# Patient Record
Sex: Female | Born: 1975 | Race: Black or African American | Hispanic: No | Marital: Single | Smoking: Former smoker
Health system: Southern US, Community
[De-identification: ages and names within clinical notes are randomized; demographics above are authoritative.]

## PROBLEM LIST (undated history)

## (undated) DIAGNOSIS — J45909 Unspecified asthma, uncomplicated: Secondary | ICD-10-CM

## (undated) DIAGNOSIS — D573 Sickle-cell trait: Secondary | ICD-10-CM

## (undated) DIAGNOSIS — N393 Stress incontinence (female) (male): Secondary | ICD-10-CM

## (undated) DIAGNOSIS — D869 Sarcoidosis, unspecified: Secondary | ICD-10-CM

## (undated) DIAGNOSIS — I10 Essential (primary) hypertension: Secondary | ICD-10-CM

## (undated) HISTORY — DX: Sickle-cell trait: D57.3

## (undated) HISTORY — PX: BREAST SURGERY: SHX581

## (undated) HISTORY — DX: Stress incontinence (female) (male): N39.3

## (undated) HISTORY — PX: TUBAL LIGATION: SHX77

---

## 1997-07-23 ENCOUNTER — Inpatient Hospital Stay (HOSPITAL_COMMUNITY): Admission: AD | Admit: 1997-07-23 | Discharge: 1997-07-23 | Payer: Self-pay | Admitting: Obstetrics

## 1997-08-05 ENCOUNTER — Other Ambulatory Visit: Admission: RE | Admit: 1997-08-05 | Discharge: 1997-08-05 | Payer: Self-pay | Admitting: Obstetrics

## 1997-08-13 ENCOUNTER — Inpatient Hospital Stay (HOSPITAL_COMMUNITY): Admission: AD | Admit: 1997-08-13 | Discharge: 1997-08-13 | Payer: Self-pay | Admitting: Obstetrics

## 1997-08-15 ENCOUNTER — Ambulatory Visit (HOSPITAL_COMMUNITY): Admission: AD | Admit: 1997-08-15 | Discharge: 1997-08-15 | Payer: Self-pay | Admitting: Obstetrics

## 1997-10-31 ENCOUNTER — Inpatient Hospital Stay (HOSPITAL_COMMUNITY): Admission: AD | Admit: 1997-10-31 | Discharge: 1997-10-31 | Payer: Self-pay | Admitting: Obstetrics

## 1997-11-01 ENCOUNTER — Emergency Department (HOSPITAL_COMMUNITY): Admission: EM | Admit: 1997-11-01 | Discharge: 1997-11-01 | Payer: Self-pay | Admitting: Emergency Medicine

## 1997-11-01 ENCOUNTER — Encounter: Payer: Self-pay | Admitting: Emergency Medicine

## 1997-11-04 ENCOUNTER — Ambulatory Visit (HOSPITAL_COMMUNITY): Admission: RE | Admit: 1997-11-04 | Discharge: 1997-11-04 | Payer: Self-pay | Admitting: Emergency Medicine

## 1998-03-28 ENCOUNTER — Ambulatory Visit: Admission: RE | Admit: 1998-03-28 | Discharge: 1998-03-28 | Payer: Self-pay | Admitting: Obstetrics

## 1998-03-28 ENCOUNTER — Other Ambulatory Visit: Admission: RE | Admit: 1998-03-28 | Discharge: 1998-03-28 | Payer: Self-pay | Admitting: Obstetrics

## 1998-10-12 ENCOUNTER — Inpatient Hospital Stay (HOSPITAL_COMMUNITY): Admission: AD | Admit: 1998-10-12 | Discharge: 1998-10-12 | Payer: Self-pay | Admitting: Obstetrics

## 1998-11-06 ENCOUNTER — Inpatient Hospital Stay (HOSPITAL_COMMUNITY): Admission: AD | Admit: 1998-11-06 | Discharge: 1998-11-09 | Payer: Self-pay | Admitting: Obstetrics

## 1999-10-03 ENCOUNTER — Other Ambulatory Visit: Admission: RE | Admit: 1999-10-03 | Discharge: 1999-10-03 | Payer: Self-pay | Admitting: Obstetrics

## 2000-04-04 ENCOUNTER — Inpatient Hospital Stay (HOSPITAL_COMMUNITY): Admission: AD | Admit: 2000-04-04 | Discharge: 2000-04-06 | Payer: Self-pay | Admitting: Obstetrics

## 2000-05-22 ENCOUNTER — Ambulatory Visit (HOSPITAL_COMMUNITY): Admission: RE | Admit: 2000-05-22 | Discharge: 2000-05-22 | Payer: Self-pay | Admitting: Obstetrics

## 2002-12-28 ENCOUNTER — Emergency Department (HOSPITAL_COMMUNITY): Admission: EM | Admit: 2002-12-28 | Discharge: 2002-12-28 | Payer: Self-pay | Admitting: Emergency Medicine

## 2005-06-30 ENCOUNTER — Emergency Department (HOSPITAL_COMMUNITY): Admission: EM | Admit: 2005-06-30 | Discharge: 2005-06-30 | Payer: Self-pay | Admitting: Emergency Medicine

## 2006-09-20 ENCOUNTER — Encounter: Payer: Self-pay | Admitting: Pulmonary Disease

## 2007-06-07 ENCOUNTER — Emergency Department (HOSPITAL_COMMUNITY): Admission: EM | Admit: 2007-06-07 | Discharge: 2007-06-07 | Payer: Self-pay | Admitting: Emergency Medicine

## 2007-06-10 ENCOUNTER — Ambulatory Visit (HOSPITAL_COMMUNITY): Admission: RE | Admit: 2007-06-10 | Discharge: 2007-06-10 | Payer: Self-pay | Admitting: Pulmonary Disease

## 2007-06-27 ENCOUNTER — Ambulatory Visit: Payer: Self-pay | Admitting: Pulmonary Disease

## 2007-06-27 DIAGNOSIS — R0602 Shortness of breath: Secondary | ICD-10-CM

## 2007-06-27 DIAGNOSIS — I10 Essential (primary) hypertension: Secondary | ICD-10-CM | POA: Insufficient documentation

## 2007-06-27 LAB — CONVERTED CEMR LAB
ALT: 30 units/L (ref 0–35)
AST: 29 units/L (ref 0–37)
Basophils Absolute: 0.1 10*3/uL (ref 0.0–0.1)
Basophils Relative: 1 % (ref 0.0–1.0)
Bilirubin, Direct: 0.1 mg/dL (ref 0.0–0.3)
CO2: 30 meq/L (ref 19–32)
Chloride: 107 meq/L (ref 96–112)
Lymphocytes Relative: 26.5 % (ref 12.0–46.0)
MCHC: 33.2 g/dL (ref 30.0–36.0)
Neutrophils Relative %: 55.8 % (ref 43.0–77.0)
Potassium: 3.5 meq/L (ref 3.5–5.1)
Prothrombin Time: 12.8 s (ref 10.9–13.3)
RBC: 4.69 M/uL (ref 3.87–5.11)
RDW: 13.2 % (ref 11.5–14.6)
Sodium: 140 meq/L (ref 135–145)
Total Bilirubin: 0.6 mg/dL (ref 0.3–1.2)

## 2007-07-02 ENCOUNTER — Ambulatory Visit: Payer: Self-pay | Admitting: Pulmonary Disease

## 2007-07-02 ENCOUNTER — Ambulatory Visit: Admission: RE | Admit: 2007-07-02 | Discharge: 2007-07-02 | Payer: Self-pay | Admitting: Pulmonary Disease

## 2007-07-02 ENCOUNTER — Encounter: Payer: Self-pay | Admitting: Pulmonary Disease

## 2007-07-07 ENCOUNTER — Telehealth (INDEPENDENT_AMBULATORY_CARE_PROVIDER_SITE_OTHER): Payer: Self-pay | Admitting: *Deleted

## 2007-07-14 ENCOUNTER — Ambulatory Visit: Payer: Self-pay | Admitting: Pulmonary Disease

## 2007-07-14 ENCOUNTER — Emergency Department (HOSPITAL_COMMUNITY): Admission: EM | Admit: 2007-07-14 | Discharge: 2007-07-14 | Payer: Self-pay | Admitting: Family Medicine

## 2007-07-14 DIAGNOSIS — D869 Sarcoidosis, unspecified: Secondary | ICD-10-CM | POA: Insufficient documentation

## 2007-07-14 DIAGNOSIS — J4489 Other specified chronic obstructive pulmonary disease: Secondary | ICD-10-CM | POA: Insufficient documentation

## 2007-07-14 DIAGNOSIS — J449 Chronic obstructive pulmonary disease, unspecified: Secondary | ICD-10-CM | POA: Insufficient documentation

## 2007-07-14 DIAGNOSIS — J45909 Unspecified asthma, uncomplicated: Secondary | ICD-10-CM | POA: Insufficient documentation

## 2007-09-08 ENCOUNTER — Ambulatory Visit: Payer: Self-pay | Admitting: Pulmonary Disease

## 2007-10-14 ENCOUNTER — Encounter: Payer: Self-pay | Admitting: Pulmonary Disease

## 2007-10-14 ENCOUNTER — Ambulatory Visit: Payer: Self-pay | Admitting: Pulmonary Disease

## 2007-11-11 ENCOUNTER — Ambulatory Visit: Payer: Self-pay | Admitting: Pulmonary Disease

## 2008-07-05 ENCOUNTER — Emergency Department (HOSPITAL_COMMUNITY): Admission: EM | Admit: 2008-07-05 | Discharge: 2008-07-05 | Payer: Self-pay | Admitting: Family Medicine

## 2009-06-10 IMAGING — CR DG CHEST 1V PORT
1 series · 1 of 1 positions shown · non-contrast
Comparison: PA and lateral chest 06/07/2007

CLINICAL DATA: , question pneumothorax.  The patient status post
bronchial biopsies the right lower lobe.

PORTABLE CHEST - 1 VIEW

[view not recorded]
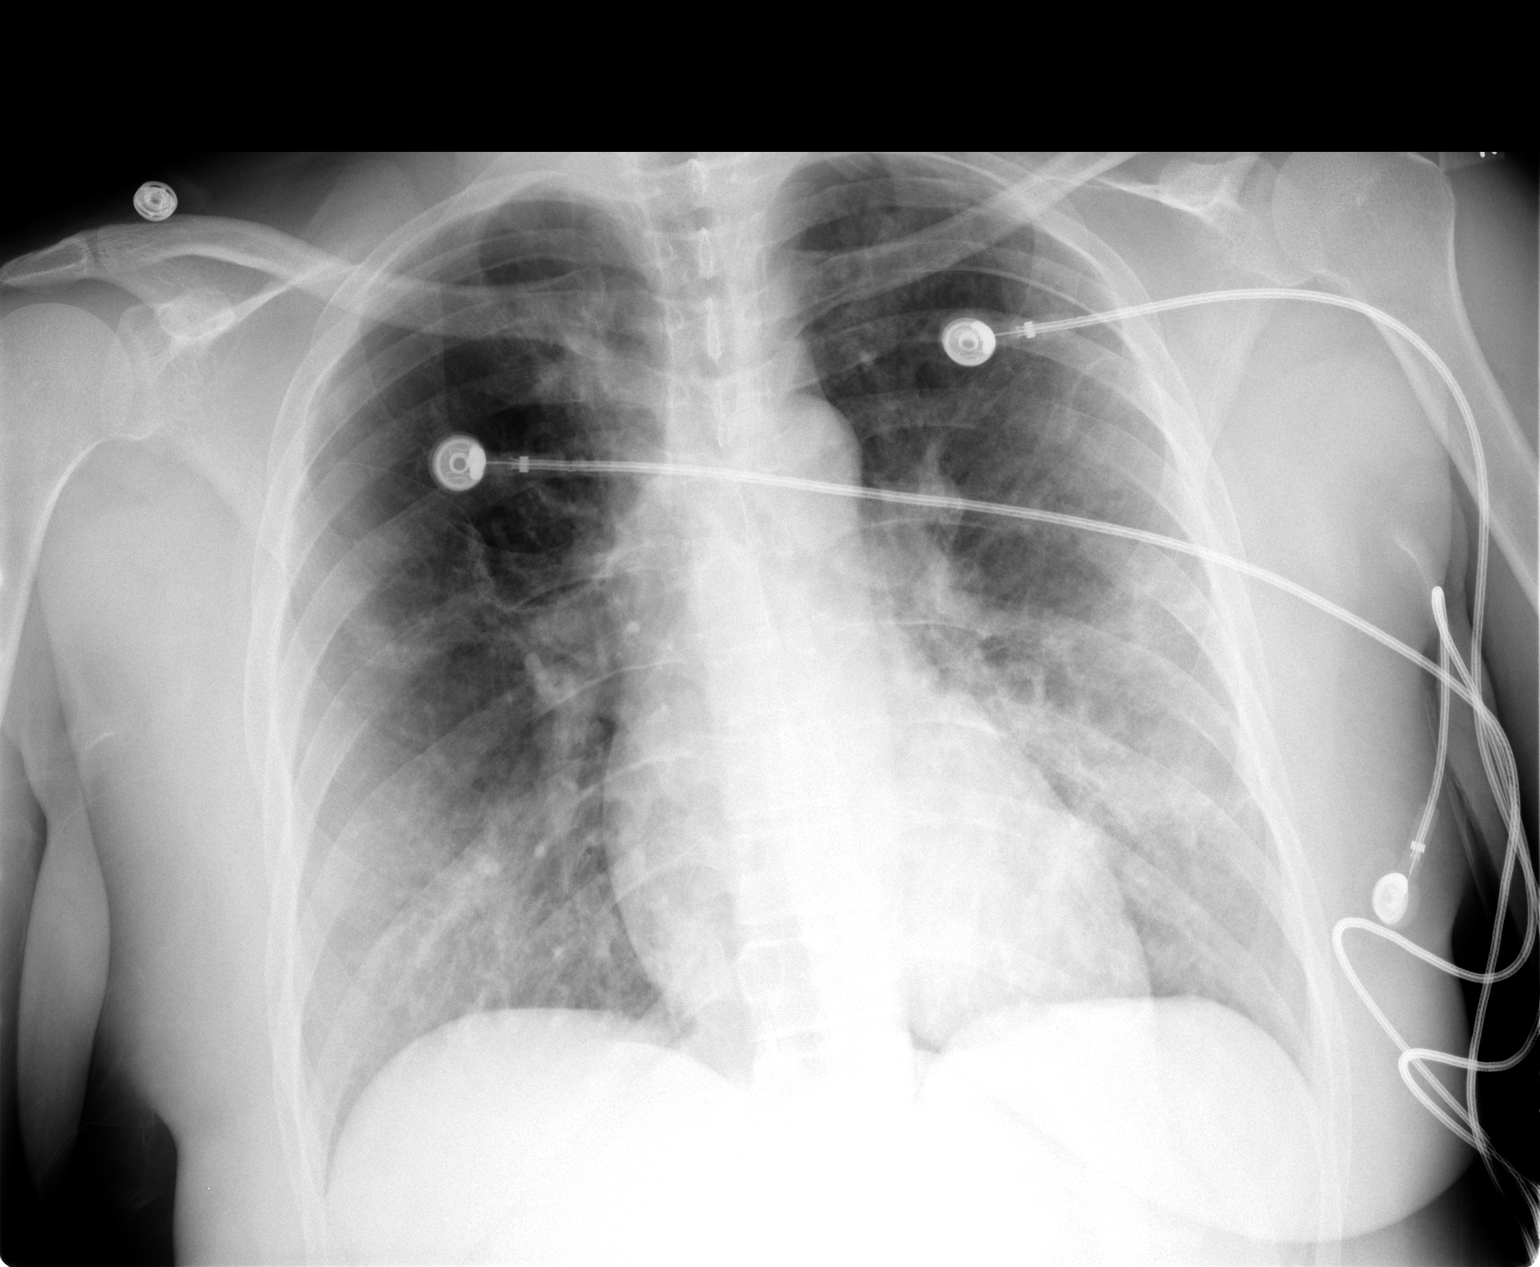

[1 of 1 positions shown; findings below may reference images not displayed]

FINDINGS: Lung volumes are low with scattered atelectatic change.
There is no pneumothorax.  Bullous lesion is seen the right upper
lobe with patchy bilateral airspace opacity in the mid and lower
lung zones persisting.
IMPRESSION: 1.  Negative for pneumothorax.  No change since the prior study.

## 2010-06-13 NOTE — Op Note (Signed)
NAMEORLA, ESTRIN                ACCOUNT NO.:  [id number]   MEDICAL RECORD NO.:  [id number]          PATIENT TYPE:  AMB   LOCATION:  CARD                         FACILITY:  [facility]   PHYSICIAN:  Barbaraann Share, MD,FCCPDATE OF BIRTH:  03-04-73   DATE OF PROCEDURE:  07/02/2007  DATE OF DISCHARGE:                               OPERATIVE REPORT   PROCEDURE:  Flexible fiberoptic bronchoscopy with biopsy and  bronchoalveolar lavage   INDICATIONS:  Pulmonary infiltrates of unknown etiology.   OPERATOR:  Barbaraann Share, MD.   ANESTHESIA:  Demerol 100 mg IV, Versed 10 mg IV, and topical 1%  lidocaine to vocal cords and airways during the procedure.   DESCRIPTION:  After obtaining informed consent and under close  cardiopulmonary monitoring, the above preop anesthesia was given and the  fiberoptic scope was passed through the left naris and into the  posterior pharynx, where there were no lesions or other abnormalities  seen.  The vocal cords appeared to be within normal limits and moved  bilaterally on phonation.  The scope was then passed into the trachea,  where it was examined along its entire length down to the level of the  carina.  The left and right tracheobronchial trees were examined  serially to the subsegmental level with no abnormality being noted.  Attention was then paid to the superior segment of the left lower lobe,  where bronchoalveolar lavage was done.  The scope was then passed to the  right lower lobe, where transbronchial biopsies under fluoroscopic  guidance were taken from the lateral basilar segment and posterior  basilar segment of the right lower lobe.  The patient had excellent  hemostasis throughout the procedure and remained cardiovascularly  stable.  Fluoroscopy was used to examine the right chest after the  procedure and there was no immediate evidence for pneumothorax.  Overall, the patient tolerated the procedure well and there were no  complications.  Chest x-ray is pending at time of dictation to rule out  pneumothorax, post transbronchial lung biopsy.      Barbaraann Share, MD,FCCP  Electronically Signed     [facility]  D:  07/02/2007  T:  07/02/2007  Job:  [id number]

## 2010-06-16 NOTE — Op Note (Signed)
[facility]  Patient:    Teresa Mendez, Teresa Mendez                       MRN: [id number] Proc. Date: 05/22/00 Adm. Date:  [id number] Attending:  Venita Sheffield                           Operative Report  PREOPERATIVE DIAGNOSIS:       Multiparity.  POSTOPERATIVE DIAGNOSIS:      Multiparity.  OPERATION:                    Open laparoscopic tubal sterilization.  SURGEON:                      Kathreen Cosier, M.D.  ANESTHESIA:                   General anesthesia.  DESCRIPTION OF PROCEDURE:     With the patient in the lithotomy position, abdomen, perineum, and vagina prepped and draped.  Bladder emptied with straight catheter.  Weighted speculum placed in the vagina.  Anterior lip of the cervix grasped with the Hulka tenaculum.  In the umbilicus, a transverse incision made, carried down to the fascia.  The fascia was cleaned, grasped with two Kochers.  The fascia and the peritoneum were opened with Mayo scissors.  The sleeve of the trocar was inserted intraperitoneally.  Three liters of carbon dioxide infusion in peritoneal cavity.  The visualizing scope inserted through the sleeve with scope.  Uterus, tubes, and ovaries were normal.  cautery probe inserted through the sleeve of the scope.  The right tube was grasped 1 inch from the cornu and cauterized.  The tube was cauterized in a total of five places, moving lateral from the first site of cautery.  Approximately 2 inches of tube cauterized.  The procedure was done in the exact fashion on the other side.  Probes were removed, CO2 allowed to escape from the peritoneal cavity.  Fascia closed with one suture of 0 Dexon and skin closed with subcuticular stitch of 3-0 plain.  The patient tolerated the procedure well and was taken to the recovery room in good condition. DD:  05/22/00 TD:  05/22/00 Job: [id number]

## 2010-10-26 LAB — LEGIONELLA PROFILE(CULTURE+DFA/SMEAR): Legionella Antigen (DFA): NEGATIVE

## 2010-10-26 LAB — BODY FLUID CELL COUNT WITH DIFFERENTIAL
Monocyte-Macrophage-Serous Fluid: 12 — ABNORMAL LOW
Neutrophil Count, Fluid: 76 — ABNORMAL HIGH

## 2010-10-26 LAB — P CARINII SMEAR DFA

## 2010-10-26 LAB — FUNGUS CULTURE W SMEAR

## 2010-10-26 LAB — CULTURE, ROUTINE-ABSCESS

## 2010-10-26 LAB — CULTURE, RESPIRATORY W GRAM STAIN

## 2010-10-26 LAB — PATHOLOGIST SMEAR REVIEW

## 2010-10-26 LAB — AFB CULTURE WITH SMEAR (NOT AT ARMC)

## 2011-07-02 ENCOUNTER — Emergency Department (HOSPITAL_COMMUNITY)
Admission: EM | Admit: 2011-07-02 | Discharge: 2011-07-02 | Disposition: A | Discharge status: Home / Self Care | Payer: Medicaid Other | Source: Home / Self Care | Attending: Emergency Medicine | Admitting: Emergency Medicine

## 2011-07-02 ENCOUNTER — Encounter (HOSPITAL_COMMUNITY): Payer: Self-pay | Admitting: *Deleted

## 2011-07-02 DIAGNOSIS — M436 Torticollis: Secondary | ICD-10-CM

## 2011-07-02 HISTORY — DX: Essential (primary) hypertension: I10

## 2011-07-02 MED ORDER — MELOXICAM 15 MG PO TABS: 15.0000 mg | ORAL_TABLET | Freq: Every day | ORAL | Status: AC

## 2011-07-02 MED ORDER — METHOCARBAMOL 500 MG PO TABS: 500.0000 mg | ORAL_TABLET | Freq: Three times a day (TID) | ORAL | Status: AC

## 2011-07-02 MED ORDER — TRAMADOL HCL 50 MG PO TABS: 100.0000 mg | ORAL_TABLET | Freq: Three times a day (TID) | ORAL | Status: AC | PRN

## 2011-07-02 NOTE — ED Provider Notes (Signed)
Chief Complaint  Patient presents with  . Neck Pain    History of Present Illness:   Teresa Mendez is a 36 year old female with a one half day history of pain in the right side of the neck which extends from the septal area to the shoulder. She woke up with pain one morning and denies any injury. She has had similar symptoms before. It hurts to rotate to the right or to bend to the right. There is no radiation down the arm, numbness, tingling, or muscle weakness. She denies fever, chills, or sweats.  Review of Systems:  Other than noted above, the patient denies any of the following symptoms: Constitutional:  No fever, chills, or sweats. ENT:  No nasal congestion, sore throat, pain on swallowing, or oral ulcerations or lesions.  No hoarseness or change in voice. Neck:  No swelling, or adenopathy.  Full ROM without pain. Cardiac:  No chest pain, tightness, pressure, or syncope. Respiratory:  No cough, wheezing, or dyspnea. M-S:  No joint pain, muscle pain, or back problems. Neuro:  No muscle weakness, numbness or paresthesias. Skin:  No rash.  PMFSH:  Past medical history, family history, social history, meds, and allergies were reviewed.  Physical Exam:   Vital signs:  BP 150/95  Pulse 84  Temp(Src) 98.7 F (37.1 C) (Oral)  Resp 18  SpO2 100%  LMP 06/04/2011 General:  Alert, oriented and in no distress. Eye:  PERRL, full EOMs. ENT:  Pharynx clear, no oral lesions. Neck:  There was tenderness to palpation over the right trapezius ridge. No tenderness in the midline. She's holding her head to the left and attempts to either rotate the right or lateral bending to the right causes pain. Lungs:  No respiratory distress.  Breath sounds clear and equal bilaterally.  No wheezes, rales or rhonchi. Heart:  Regular rhythm.  No gallops, murmers, or rubs. Ext:  No upper extremity edema, pulses full.  Full ROM of joints with no joint or muscle pain to palpation. Neuro:  Alert and oriented times 3.  No  focal muscle weakness.  DTRs symmetric.  Sensation intact to light touch. Skin: Clear, warm and dry.  No rash.  Good capillary refill.  Assessment:  The encounter diagnosis was Torticollis.  Plan:   1.  The following meds were prescribed:   New Prescriptions   MELOXICAM (MOBIC) 15 MG TABLET    Take 1 tablet (15 mg total) by mouth daily.   METHOCARBAMOL (ROBAXIN) 500 MG TABLET    Take 1 tablet (500 mg total) by mouth 3 (three) times daily.   TRAMADOL (ULTRAM) 50 MG TABLET    Take 2 tablets (100 mg total) by mouth every 8 (eight) hours as needed for pain.   2.  The patient was instructed in symptomatic care and handouts were given. 3.  The patient was told to return if becoming worse in any way, if no better in 3 or 4 days, and given some red flag symptoms that would indicate earlier return.    Reuben Likes, MD 07/02/11 2108

## 2011-07-02 NOTE — ED Notes (Signed)
Teresa Mendez  HAS  HAD  NECK  PAIN  FOR  THE  LAST  SEV  DAYS  SHE  DENYS  ANY INJURY  SHE   STATES  SHE  HAS  HAD  SIMIL;AR  EPISODE  IN PAST LIKE  THIS        SHE  DENYS  ANY  NUMBENESS IN  EXTREMITIES  HOWEVER  REPORTS   HER  HANDS  FEEL  TIGHT       -  SHE  REPORTS    THE PAIN IS  WORSE  ON MOVEMENT

## 2011-07-02 NOTE — Discharge Instructions (Signed)
Torticollis, Acute You have suddenly (acutely) developed a twisted neck (torticollis). This is usually a self-limited condition. CAUSES  Acute torticollis may be caused by malposition, trauma or infection. Most commonly, acute torticollis is caused by sleeping in an awkward position. Torticollis may also be caused by the flexion, extension or twisting of the neck muscles beyond their normal position. Sometimes, the exact cause may not be known. SYMPTOMS  Usually, there is pain and limited movement of the neck. Your neck may twist to one side. DIAGNOSIS  The diagnosis is often made by physical examination. X-rays, CT scans or MRIs may be done if there is a history of trauma or concern of infection. TREATMENT  For a common, stiff neck that develops during sleep, treatment is focused on relaxing the contracted neck muscle. Medications (including shots) may be used to treat the problem. Most cases resolve in several days. Torticollis usually responds to conservative physical therapy. If left untreated, the shortened and spastic neck muscle can cause deformities in the face and neck. Rarely, surgery is required. HOME CARE INSTRUCTIONS   Use over-the-counter and prescription medications as directed by your caregiver.   Do stretching exercises and massage the neck as directed by your caregiver.   Follow up with physical therapy if needed and as directed by your caregiver.  SEEK IMMEDIATE MEDICAL CARE IF:   You develop difficulty breathing or noisy breathing (stridor).   You drool, develop trouble swallowing or have pain with swallowing.   You develop numbness or weakness in the hands or feet.   You have changes in speech or vision.   You have problems with urination or bowel movements.   You have difficulty walking.   You have a fever.   You have increased pain.  MAKE SURE YOU:   Understand these instructions.   Will watch your condition.   Will get help right away if you are not  doing well or get worse.  Document Released: 01/13/2000 Document Revised: 01/04/2011 Document Reviewed: 02/23/2009 Exit[employer] Patient Information 2012 [employer]. 

## 2011-07-11 ENCOUNTER — Telehealth (HOSPITAL_COMMUNITY): Payer: Self-pay | Admitting: *Deleted

## 2011-07-11 NOTE — ED Notes (Signed)
Brandy at [facility] called and said pt. asked for a refill of her Methocarbimal. I told her we are not a primary care facility and don't do refills. Vassie Moselle 07/11/2011

## 2013-08-05 ENCOUNTER — Emergency Department (HOSPITAL_COMMUNITY)
Admission: EM | Admit: 2013-08-05 | Discharge: 2013-08-05 | Disposition: A | Discharge status: Home / Self Care | Payer: Medicaid Other | Source: Home / Self Care | Attending: Emergency Medicine | Admitting: Emergency Medicine

## 2013-08-05 ENCOUNTER — Encounter (HOSPITAL_COMMUNITY): Payer: Self-pay | Admitting: Emergency Medicine

## 2013-08-05 DIAGNOSIS — J45901 Unspecified asthma with (acute) exacerbation: Secondary | ICD-10-CM

## 2013-08-05 HISTORY — DX: Unspecified asthma, uncomplicated: J45.909

## 2013-08-05 MED ORDER — IPRATROPIUM BROMIDE 0.02 % IN SOLN
0.5000 mg | Freq: Once | RESPIRATORY_TRACT | Status: AC
  Administered 2013-08-05: 0.5 mg via RESPIRATORY_TRACT

## 2013-08-05 MED ORDER — ALBUTEROL SULFATE (2.5 MG/3ML) 0.083% IN NEBU
5.0000 mg | INHALATION_SOLUTION | Freq: Once | RESPIRATORY_TRACT | Status: AC
  Administered 2013-08-05: 5 mg via RESPIRATORY_TRACT

## 2013-08-05 MED ORDER — ALBUTEROL SULFATE (2.5 MG/3ML) 0.083% IN NEBU
INHALATION_SOLUTION | RESPIRATORY_TRACT | Status: AC
  Filled 2013-08-05: qty 6

## 2013-08-05 MED ORDER — PREDNISONE 20 MG PO TABS
ORAL_TABLET | ORAL | Status: AC
  Filled 2013-08-05: qty 3

## 2013-08-05 MED ORDER — PREDNISONE 10 MG PO TABS: ORAL_TABLET | ORAL | Status: DC

## 2013-08-05 MED ORDER — ALBUTEROL SULFATE HFA 108 (90 BASE) MCG/ACT IN AERS: 1.0000 | INHALATION_SPRAY | RESPIRATORY_TRACT | Status: DC | PRN

## 2013-08-05 MED ORDER — IPRATROPIUM BROMIDE 0.02 % IN SOLN
RESPIRATORY_TRACT | Status: AC
  Filled 2013-08-05: qty 2.5

## 2013-08-05 MED ORDER — PREDNISONE 20 MG PO TABS
60.0000 mg | ORAL_TABLET | Freq: Once | ORAL | Status: AC
  Administered 2013-08-05: 60 mg via ORAL

## 2013-08-05 NOTE — ED Provider Notes (Signed)
CSN: [id number]     Arrival date & time 08/05/13  1012 History   First MD Initiated Contact with Patient 08/05/13 1122     Chief Complaint  Patient presents with  . URI  . Vaginal Discharge   (Consider location/radiation/quality/duration/timing/severity/associated sxs/prior Treatment) HPI Comments: Patient presents with 1-2 weeks of progressive asthma exacerbation with associated cough and wheezing.  Denies fever/chills Is a smoker (states she has been trying to quit over past 4-5 days). No hemoptysis. PCP: Dr. Jeanie Cooks Using albuterol MDI at home with minimal relief.    Patient is a 38 y.o. female presenting with URI and vaginal discharge. The history is provided by the patient.  URI Presenting symptoms: cough   Associated symptoms: wheezing   Vaginal Discharge   Past Medical History  Diagnosis Date  . Hypertension   . Asthma    History reviewed. No pertinent past surgical history. No family history on file. History  Substance Use Topics  . Smoking status: Current Every Day Smoker  . Smokeless tobacco: Not on file  . Alcohol Use: No   OB History   Grav Para Term Preterm Abortions TAB SAB Ect Mult Living                 Review of Systems  Constitutional: Negative.   HENT: Negative.   Eyes: Negative.   Respiratory: Positive for cough, chest tightness, shortness of breath and wheezing.   Cardiovascular: Negative.   Genitourinary: Positive for vaginal discharge.  Allergic/Immunologic: Negative for immunocompromised state.  Neurological: Negative.     Allergies  Review of patient's allergies indicates no known allergies.  Home Medications   Prior to Admission medications   Medication Sig Start Date End Date Taking? Authorizing Provider  ALBUTEROL IN Inhale into the lungs.   Yes Historical Provider, MD  amLODipine (NORVASC) 10 MG tablet Take 10 mg by mouth daily.   Yes Historical Provider, MD  lisinopril (PRINIVIL,ZESTRIL) 20 MG tablet Take 20 mg by mouth daily.    Yes Historical Provider, MD  albuterol (PROVENTIL HFA;VENTOLIN HFA) 108 (90 BASE) MCG/ACT inhaler Inhale 1-2 puffs into the lungs every 4 (four) hours as needed for wheezing or shortness of breath. 08/05/13   Lahoma Rocker, PA  predniSONE (DELTASONE) 10 MG tablet Beginning 08/06/2013 take 5 tabs po QD day 1, 4 tabs po QD day 2, 3 tabs po QD day 3, 2 tabs po QD day 4, 1 tab po QD day 5, then stop 08/05/13   Annett Gula Avah Bashor, PA   BP 163/94  Pulse 92  Temp(Src) 98.5 F (36.9 C) (Oral)  Resp 30  SpO2 96% Physical Exam  Nursing note and vitals reviewed. Constitutional: She is oriented to person, place, and time. She appears well-developed and well-nourished. No distress.  HENT:  Head: Normocephalic and atraumatic.  Right Ear: Hearing, tympanic membrane, external ear and ear canal normal.  Left Ear: Hearing, tympanic membrane, external ear and ear canal normal.  Nose: Nose normal.  Mouth/Throat: Uvula is midline, oropharynx is clear and moist and mucous membranes are normal. No oral lesions. No trismus in the jaw.  Eyes: Conjunctivae are normal. No scleral icterus.  Neck: Normal range of motion. Neck supple.  Cardiovascular: Normal rate, regular rhythm and normal heart sounds.   Pulmonary/Chest: Accessory muscle usage present. Tachypnea noted. She has decreased breath sounds in the right lower field and the left lower field. She has wheezes in the right upper field, the right middle field, the right lower field, the left  upper field, the left middle field and the left lower field. She has no rhonchi. She has no rales.  Abdominal: Soft. Bowel sounds are normal. She exhibits no distension. There is no tenderness.  Musculoskeletal: Normal range of motion. She exhibits no edema and no tenderness.  Neurological: She is alert and oriented to person, place, and time.  Skin: Skin is warm and dry. No rash noted. No erythema. No pallor.  Psychiatric: She has a normal mood and affect. Her behavior  is normal.    ED Course  Procedures (including critical care time) Labs Review Labs Reviewed - No data to display  Imaging Review No results found.   MDM   1. Asthma exacerbation   Patient states that while she did mention a clear vaginal discharge to her RN, she wishes to focus on treatment for her asthma and increased work of breathing today and plans to follow up with her PCP for evaluation of discharge. Therefore, no pelvic exam performed.  Patient received Albuterol 5 mg & Atrovent 0.5mg  neb treatment at [facility] along with oral prednisone 60 mg. No hypoxia or fever. Re-evaluation following treatment revealed moderate improvement, but still with visible increased work of breathing and moderate wheezing . I suggested to patient that she would benefit from either extended time bronchodilator treatment or repeat neb treatments at the ER. She agreed that this would be beneficial, but states she has to pick up her children and then take her mother to a doctor's appointment. So, she requests discharge home with the understanding that if symptoms do not begin to improve or if they begin to worsen over the next few hours, she is to report directly to her nearest ER for assistance. Will D/C home with rx for albuterol MDI and prednisone taper and printed instructions.    [place], [place] 08/05/13 [phone]

## 2013-08-05 NOTE — ED Notes (Signed)
Patient has multiple complaints.  Patient has uri, wheezing, right ear pain.   General abdominal soreness, bilateral lower extremity swelling and pain/tightness. Vaginal discharge clear.

## 2013-08-05 NOTE — Discharge Instructions (Signed)
Asthma, Acute Bronchospasm °Acute bronchospasm caused by asthma is also referred to as an asthma attack. Bronchospasm means your air passages become narrowed. The narrowing is caused by inflammation and tightening of the muscles in the air tubes (bronchi) in your lungs. This can make it hard to breathe or cause you to wheeze and cough. °CAUSES °Possible triggers are: °· Animal dander from the skin, hair, or feathers of animals. °· Dust mites contained in house dust. °· Cockroaches. °· Pollen from trees or grass. °· Mold. °· Cigarette or tobacco smoke. °· Air pollutants such as dust, household cleaners, hair sprays, aerosol sprays, paint fumes, strong chemicals, or strong odors. °· Cold air or weather changes. Cold air may trigger inflammation. Winds increase molds and pollens in the air. °· Strong emotions such as crying or laughing hard. °· Stress. °· Certain medicines such as aspirin or beta-blockers. °· Sulfites in foods and drinks, such as dried fruits and wine. °· Infections or inflammatory conditions, such as a flu, cold, or inflammation of the nasal membranes (rhinitis). °· Gastroesophageal reflux disease (GERD). GERD is a condition where stomach acid backs up into your esophagus. °· Exercise or strenuous activity. °SIGNS AND SYMPTOMS  °· Wheezing. °· Excessive coughing, particularly at night. °· Chest tightness. °· Shortness of breath. °DIAGNOSIS  °Your health care provider will ask you about your medical history and perform a physical exam. A chest X-ray or blood testing may be performed to look for other causes of your symptoms or other conditions that may have triggered your asthma attack.  °TREATMENT  °Treatment is aimed at reducing inflammation and opening up the airways in your lungs.  Most asthma attacks are treated with inhaled medicines. These include quick relief or rescue medicines (such as bronchodilators) and controller medicines (such as inhaled corticosteroids). These medicines are sometimes  given through an inhaler or a nebulizer. Systemic steroid medicine taken by mouth or given through an IV tube also can be used to reduce the inflammation when an attack is moderate or severe. Antibiotic medicines are only used if a bacterial infection is present.  °HOME CARE INSTRUCTIONS  °· Rest. °· Drink plenty of liquids. This helps the mucus to remain thin and be easily coughed up. Only use caffeine in moderation and do not use alcohol until you have recovered from your illness. °· Do not smoke. Avoid being exposed to secondhand smoke. °· You play a critical role in keeping yourself in good health. Avoid exposure to things that cause you to wheeze or to have breathing problems. °· Keep your medicines up-to-date and available. Carefully follow your health care provider's treatment plan. °· Take your medicine exactly as prescribed. °· When pollen or pollution is bad, keep windows closed and use an air conditioner or go to places with air conditioning. °· Asthma requires careful medical care. See your health care provider for a follow-up as advised. If you are more than [redacted] weeks pregnant and you were prescribed any new medicines, let your obstetrician know about the visit and how you are doing. Follow up with your health care provider as directed. °· After you have recovered from your asthma attack, make an appointment with your outpatient doctor to talk about ways to reduce the likelihood of future attacks. If you do not have a doctor who manages your asthma, make an appointment with a primary care doctor to discuss your asthma. °SEEK IMMEDIATE MEDICAL CARE IF:  °· You are getting worse. °· You have trouble breathing. If severe, call your local   emergency services (911 in the [place] °· You develop chest pain or discomfort. °· You are vomiting. °· You are not able to keep fluids down. °· You are coughing up yellow, green, brown, or bloody sputum. °· You have a fever and your symptoms suddenly get worse. °· You have  trouble swallowing. °MAKE SURE YOU:  °· Understand these instructions. °· Will watch your condition. °· Will get help right away if you are not doing well or get worse. °Document Released: 05/02/2006 Document Revised: 01/20/2013 Document Reviewed: 07/23/2012 °[employer]® Patient Information ©2015 [employer]. This information is not intended to replace advice given to you by your health care provider. Make sure you discuss any questions you have with your health care provider. ° °Asthma Attack Prevention °Although there is no way to prevent asthma from starting, you can take steps to control the disease and reduce its symptoms. Learn about your asthma and how to control it. Take an active role to control your asthma by working with your health care provider to create and follow an asthma action plan. An asthma action plan guides you in: °· Taking your medicines properly. °· Avoiding things that set off your asthma or make your asthma worse (asthma triggers). °· Tracking your level of asthma control. °· Responding to worsening asthma. °· Seeking emergency care when needed. °To track your asthma, keep records of your symptoms, check your peak flow number using a handheld device that shows how well air moves out of your lungs (peak flow meter), and get regular asthma checkups.  °WHAT ARE SOME WAYS TO PREVENT AN ASTHMA ATTACK? °· Take medicines as directed by your health care provider. °· Keep track of your asthma symptoms and level of control. °· With your health care provider, write a detailed plan for taking medicines and managing an asthma attack. Then be sure to follow your action plan. Asthma is an ongoing condition that needs regular monitoring and treatment. °· Identify and avoid asthma triggers. Many outdoor allergens and irritants (such as pollen, mold, cold air, and air pollution) can trigger asthma attacks. Find out what your asthma triggers are and take steps to avoid them. °· Monitor your breathing. Learn  to recognize warning signs of an attack, such as coughing, wheezing, or shortness of breath. Your lung function may decrease before you notice any signs or symptoms, so regularly measure and record your peak airflow with a home peak flow meter. °· Identify and treat attacks early. If you act quickly, you are less likely to have a severe attack. You will also need less medicine to control your symptoms. When your peak flow measurements decrease and alert you to an upcoming attack, take your medicine as instructed and immediately stop any activity that may have triggered the attack. If your symptoms do not improve, get medical help. °· Pay attention to increasing quick-relief inhaler use. If you find yourself relying on your quick-relief inhaler, your asthma is not under control. See your health care provider about adjusting your treatment. °WHAT CAN MAKE MY SYMPTOMS WORSE? °A number of common things can set off or make your asthma symptoms worse and cause temporary increased inflammation of your airways. Keep track of your asthma symptoms for several weeks, detailing all the environmental and emotional factors that are linked with your asthma. When you have an asthma attack, go back to your asthma diary to see which factor, or combination of factors, might have contributed to it. Once you know what these factors are, you can   take steps to control many of them. If you have allergies and asthma, it is important to take asthma prevention steps at home. Minimizing contact with the substance to which you are allergic will help prevent an asthma attack. Some triggers and ways to avoid these triggers are: °Animal Dander:  °Some people are allergic to the flakes of skin or dried saliva from animals with fur or feathers.  °· There is no such thing as a hypoallergenic dog or cat breed. All dogs or cats can cause allergies, even if they don't shed. °· Keep these pets out of your home. °· If you are not able to keep a pet  outdoors, keep the pet out of your bedroom and other sleeping areas at all times, and keep the door closed. °· Remove carpets and furniture covered with cloth from your home. If that is not possible, keep the pet away from fabric-covered furniture and carpets. °Dust Mites: °Many people with asthma are allergic to dust mites. Dust mites are tiny bugs that are found in every home in mattresses, pillows, carpets, fabric-covered furniture, bedcovers, clothes, stuffed toys, and other fabric-covered items.  °· Cover your mattress in a special dust-proof cover. °· Cover your pillow in a special dust-proof cover, or wash the pillow each week in hot water. Water must be hotter than 130° F (54.4° C) to kill dust mites. Cold or warm water used with detergent and bleach can also be effective. °· Wash the sheets and blankets on your bed each week in hot water. °· Try not to sleep or lie on cloth-covered cushions. °· Call ahead when traveling and ask for a smoke-free hotel room. Bring your own bedding and pillows in case the hotel only supplies feather pillows and down comforters, which may contain dust mites and cause asthma symptoms. °· Remove carpets from your bedroom and those laid on concrete, if you can. °· Keep stuffed toys out of the bed, or wash the toys weekly in hot water or cooler water with detergent and bleach. °Cockroaches: °Many people with asthma are allergic to the droppings and remains of cockroaches.  °· Keep food and garbage in closed containers. Never leave food out. °· Use poison baits, traps, powders, gels, or paste (for example, boric acid). °· If a spray is used to kill cockroaches, stay out of the room until the odor goes away. °Indoor Mold: °· Fix leaky faucets, pipes, or other sources of water that have mold around them. °· Clean floors and moldy surfaces with a fungicide or diluted bleach. °· Avoid using humidifiers, vaporizers, or swamp coolers. These can spread molds through the air. °Pollen and  Outdoor Mold: °· When pollen or mold spore counts are high, try to keep your windows closed. °· Stay indoors with windows closed from late morning to afternoon. Pollen and some mold spore counts are highest at that time. °· Ask your health care provider whether you need to take anti-inflammatory medicine or increase your dose of the medicine before your allergy season starts. °Other Irritants to Avoid: °· Tobacco smoke is an irritant. If you smoke, ask your health care provider how you can quit. Ask family members to quit smoking, too. Do not allow smoking in your home or car. °· If possible, do not use a wood-burning stove, kerosene heater, or fireplace. Minimize exposure to all sources of smoke, including incense, candles, fires, and fireworks. °· Try to stay away from strong odors and sprays, such as perfume, talcum powder, hair spray, and   paints.  Decrease humidity in your home and use an indoor air cleaning device. Reduce indoor humidity to below 60%. Dehumidifiers or central air conditioners can do this.  Decrease house dust exposure by changing furnace and air cooler filters frequently.  Try to have someone else vacuum for you once or twice a week. Stay out of rooms while they are being vacuumed and for a short while afterward.  If you vacuum, use a dust mask from a hardware store, a double-layered or microfilter vacuum cleaner bag, or a vacuum cleaner with a HEPA filter.  Sulfites in foods and beverages can be irritants. Do not drink beer or wine or eat dried fruit, processed potatoes, or shrimp if they cause asthma symptoms.  Cold air can trigger an asthma attack. Cover your nose and mouth with a scarf on cold or windy days.  Several health conditions can make asthma more difficult to manage, including a runny nose, sinus infections, reflux disease, psychological stress, and sleep apnea. Work with your health care provider to manage these conditions.  Avoid close contact with people who have  a respiratory infection such as a cold or the flu, since your asthma symptoms may get worse if you catch the infection. Wash your hands thoroughly after touching items that may have been handled by people with a respiratory infection.  Get a flu shot every year to protect against the flu virus, which often makes asthma worse for days or weeks. Also get a pneumonia shot if you have not previously had one. Unlike the flu shot, the pneumonia shot does not need to be given yearly. Medicines:  Talk to your health care provider about whether it is safe for you to take aspirin or non-steroidal anti-inflammatory medicines (NSAIDs). In a small number of people with asthma, aspirin and NSAIDs can cause asthma attacks. These medicines must be avoided by people who have known aspirin-sensitive asthma. It is important that people with aspirin-sensitive asthma read labels of all over-the-counter medicines used to treat pain, colds, coughs, and fever.  Beta-blockers and ACE inhibitors are other medicines you should discuss with your health care provider. HOW CAN I FIND OUT WHAT I AM ALLERGIC TO? Ask your asthma health care provider about allergy skin testing or blood testing (the RAST test) to identify the allergens to which you are sensitive. If you are found to have allergies, the most important thing to do is to try to avoid exposure to any allergens that you are sensitive to as much as possible. Other treatments for allergies, such as medicines and allergy shots (immunotherapy) are available.  CAN I EXERCISE? Follow your health care provider's advice regarding asthma treatment before exercising. It is important to maintain a regular exercise program, but vigorous exercise or exercise in cold, humid, or dry environments can cause asthma attacks, especially for those people who have exercise-induced asthma. Document Released: 01/03/2009 Document Revised: 01/20/2013 Document Reviewed: 07/23/2012 [facility] Patient  Information 2015 [place], [place]. This information is not intended to replace advice given to you by your health care provider. Make sure you discuss any questions you have with your health care provider.  Bronchospasm A bronchospasm is a spasm or tightening of the airways going into the lungs. During a bronchospasm breathing becomes more difficult because the airways get smaller. When this happens there can be coughing, a whistling sound when breathing (wheezing), and difficulty breathing. Bronchospasm is often associated with asthma, but not all patients who experience a bronchospasm have asthma. CAUSES  A bronchospasm  is caused by inflammation or irritation of the airways. The inflammation or irritation may be triggered by:   Allergies (such as to animals, pollen, food, or mold). Allergens that cause bronchospasm may cause wheezing immediately after exposure or many hours later.   Infection. Viral infections are believed to be the most common cause of bronchospasm.   Exercise.   Irritants (such as pollution, cigarette smoke, strong odors, aerosol sprays, and paint fumes).   Weather changes. Winds increase molds and pollens in the air. Rain refreshes the air by washing irritants out. Cold air may cause inflammation.   Stress and emotional upset.  SIGNS AND SYMPTOMS   Wheezing.   Excessive nighttime coughing.   Frequent or severe coughing with a simple cold.   Chest tightness.   Shortness of breath.  DIAGNOSIS  Bronchospasm is usually diagnosed through a history and physical exam. Tests, such as chest X-rays, are sometimes done to look for other conditions. TREATMENT   Inhaled medicines can be given to open up your airways and help you breathe. The medicines can be given using either an inhaler or a nebulizer machine.  Corticosteroid medicines may be given for severe bronchospasm, usually when it is associated with asthma. HOME CARE INSTRUCTIONS   Always have a plan  prepared for seeking medical care. Know when to call your health care provider and local emergency services (911 in the [place] Know where you can access local emergency care.  Only take medicines as directed by your health care provider.  If you were prescribed an inhaler or nebulizer machine, ask your health care provider to explain how to use it correctly. Always use a spacer with your inhaler if you were given one.  It is necessary to remain calm during an attack. Try to relax and breathe more slowly.  Control your home environment in the following ways:   Change your heating and air conditioning filter at least once a month.   Limit your use of fireplaces and wood stoves.  Do not smoke and do not allow smoking in your home.   Avoid exposure to perfumes and fragrances.   Get rid of pests (such as roaches and mice) and their droppings.   Throw away plants if you see mold on them.   Keep your house clean and dust free.   Replace carpet with wood, tile, or vinyl flooring. Carpet can trap dander and dust.   Use allergy-proof pillows, mattress covers, and box spring covers.   Wash bed sheets and blankets every week in hot water and dry them in a dryer.   Use blankets that are made of polyester or cotton.   Wash hands frequently. SEEK MEDICAL CARE IF:   You have muscle aches.   You have chest pain.   The sputum changes from clear or white to yellow, green, gray, or bloody.   The sputum you cough up gets thicker.   There are problems that may be related to the medicine you are given, such as a rash, itching, swelling, or trouble breathing.  SEEK IMMEDIATE MEDICAL CARE IF:   You have worsening wheezing and coughing even after taking your prescribed medicines.   You have increased difficulty breathing.   You develop severe chest pain. MAKE SURE YOU:   Understand these instructions.  Will watch your condition.  Will get help right away if you are  not doing well or get worse. Document Released: 01/18/2003 Document Revised: 01/20/2013 Document Reviewed: 07/07/2012 [facility] Patient Information 2015 [place], [place].  This information is not intended to replace advice given to you by your health care provider. Make sure you discuss any questions you have with your health care provider. ° °

## 2013-08-06 NOTE — ED Provider Notes (Signed)
Medical screening examination/treatment/procedure(s) were performed by non-physician practitioner and as supervising physician I was immediately available for consultation/collaboration.  Philipp Deputy, M.D.  Harden Mo, MD 08/06/13 [id number]

## 2015-08-18 ENCOUNTER — Ambulatory Visit (HOSPITAL_COMMUNITY)
Admission: EM | Admit: 2015-08-18 | Discharge: 2015-08-18 | Disposition: A | Discharge status: Home / Self Care | Payer: Medicaid Other | Attending: Emergency Medicine | Admitting: Emergency Medicine

## 2015-08-18 ENCOUNTER — Encounter (HOSPITAL_COMMUNITY): Payer: Self-pay | Admitting: *Deleted

## 2015-08-18 DIAGNOSIS — F17200 Nicotine dependence, unspecified, uncomplicated: Secondary | ICD-10-CM | POA: Diagnosis not present

## 2015-08-18 DIAGNOSIS — I10 Essential (primary) hypertension: Secondary | ICD-10-CM | POA: Diagnosis not present

## 2015-08-18 DIAGNOSIS — R1032 Left lower quadrant pain: Secondary | ICD-10-CM | POA: Diagnosis present

## 2015-08-18 DIAGNOSIS — Z79899 Other long term (current) drug therapy: Secondary | ICD-10-CM | POA: Insufficient documentation

## 2015-08-18 DIAGNOSIS — R319 Hematuria, unspecified: Secondary | ICD-10-CM | POA: Insufficient documentation

## 2015-08-18 DIAGNOSIS — N3090 Cystitis, unspecified without hematuria: Secondary | ICD-10-CM | POA: Diagnosis not present

## 2015-08-18 DIAGNOSIS — J45909 Unspecified asthma, uncomplicated: Secondary | ICD-10-CM | POA: Diagnosis not present

## 2015-08-18 LAB — POCT URINALYSIS DIP (DEVICE)
BILIRUBIN URINE: NEGATIVE
Glucose, UA: NEGATIVE mg/dL
Ketones, ur: NEGATIVE mg/dL
NITRITE: NEGATIVE
Protein, ur: NEGATIVE mg/dL
Specific Gravity, Urine: 1.015 (ref 1.005–1.030)
UROBILINOGEN UA: 1 mg/dL (ref 0.0–1.0)
pH: 7 (ref 5.0–8.0)

## 2015-08-18 LAB — POCT PREGNANCY, URINE: PREG TEST UR: NEGATIVE

## 2015-08-18 MED ORDER — NITROFURANTOIN MACROCRYSTAL 100 MG PO CAPS: 100.0000 mg | ORAL_CAPSULE | Freq: Two times a day (BID) | ORAL | Status: DC

## 2015-08-18 NOTE — Discharge Instructions (Signed)
Start antibiotic (Nitrofurantoin) as directed. Increase fluid intake. If symptoms do not improve within 3 days, go to PCP or if symptoms worsen, go to ER. Also- your blood pressure was elevated today. Please restart your medication today.   Urinary Tract Infection Urinary tract infections (UTIs) can develop anywhere along your urinary tract. Your urinary tract is your body's drainage system for removing wastes and extra water. Your urinary tract includes two kidneys, two ureters, a bladder, and a urethra. Your kidneys are a pair of bean-shaped organs. Each kidney is about the size of your fist. They are located below your ribs, one on each side of your spine. CAUSES Infections are caused by microbes, which are microscopic organisms, including fungi, viruses, and bacteria. These organisms are so small that they can only be seen through a microscope. Bacteria are the microbes that most commonly cause UTIs. SYMPTOMS  Symptoms of UTIs may vary by age and gender of the patient and by the location of the infection. Symptoms in young women typically include a frequent and intense urge to urinate and a painful, burning feeling in the bladder or urethra during urination. Older women and men are more likely to be tired, shaky, and weak and have muscle aches and abdominal pain. A fever may mean the infection is in your kidneys. Other symptoms of a kidney infection include pain in your back or sides below the ribs, nausea, and vomiting. DIAGNOSIS To diagnose a UTI, your caregiver will ask you about your symptoms. Your caregiver will also ask you to provide a urine sample. The urine sample will be tested for bacteria and white blood cells. White blood cells are made by your body to help fight infection. TREATMENT  Typically, UTIs can be treated with medication. Because most UTIs are caused by a bacterial infection, they usually can be treated with the use of antibiotics. The choice of antibiotic and length of  treatment depend on your symptoms and the type of bacteria causing your infection. HOME CARE INSTRUCTIONS  If you were prescribed antibiotics, take them exactly as your caregiver instructs you. Finish the medication even if you feel better after you have only taken some of the medication.  Drink enough water and fluids to keep your urine clear or pale yellow.  Avoid caffeine, tea, and carbonated beverages. They tend to irritate your bladder.  Empty your bladder often. Avoid holding urine for long periods of time.  Empty your bladder before and after sexual intercourse.  After a bowel movement, women should cleanse from front to back. Use each tissue only once. SEEK MEDICAL CARE IF:   You have back pain.  You develop a fever.  Your symptoms do not begin to resolve within 3 days. SEEK IMMEDIATE MEDICAL CARE IF:   You have severe back pain or lower abdominal pain.  You develop chills.  You have nausea or vomiting.  You have continued burning or discomfort with urination. MAKE SURE YOU:   Understand these instructions.  Will watch your condition.  Will get help right away if you are not doing well or get worse.   This information is not intended to replace advice given to you by your health care provider. Make sure you discuss any questions you have with your health care provider.   Document Released: 10/25/2004 Document Revised: 10/06/2014 Document Reviewed: 02/23/2011 Elsevier Interactive Patient Education 2016 Elsevier Inc.  Hematuria, Adult Hematuria is blood in your urine. It can be caused by a bladder infection, kidney infection, prostate infection,  kidney stone, or cancer of your urinary tract. Infections can usually be treated with medicine, and a kidney stone usually will pass through your urine. If neither of these is the cause of your hematuria, further workup to find out the reason may be needed. It is very important that you tell your health care provider about  any blood you see in your urine, even if the blood stops without treatment or happens without causing pain. Blood in your urine that happens and then stops and then happens again can be a symptom of a very serious condition. Also, pain is not a symptom in the initial stages of many urinary cancers. HOME CARE INSTRUCTIONS   Drink lots of fluid, 3-4 quarts a day. If you have been diagnosed with an infection, cranberry juice is especially recommended, in addition to large amounts of water.  Avoid caffeine, tea, and carbonated beverages because they tend to irritate the bladder.  Avoid alcohol because it may irritate the prostate.  Take all medicines as directed by your health care provider.  If you were prescribed an antibiotic medicine, finish it all even if you start to feel better.  If you have been diagnosed with a kidney stone, follow your health care provider's instructions regarding straining your urine to catch the stone.  Empty your bladder often. Avoid holding urine for long periods of time.  After a bowel movement, women should cleanse front to back. Use each tissue only once.  Empty your bladder before and after sexual intercourse if you are a female. SEEK MEDICAL CARE IF:  You develop back pain.  You have a fever.  You have a feeling of sickness in your stomach (nausea) or vomiting.  Your symptoms are not better in 3 days. Return sooner if you are getting worse. SEEK IMMEDIATE MEDICAL CARE IF:   You develop severe vomiting and are unable to keep the medicine down.  You develop severe back or abdominal pain despite taking your medicines.  You begin passing a large amount of blood or clots in your urine.  You feel extremely weak or faint, or you pass out. MAKE SURE YOU:   Understand these instructions.  Will watch your condition.  Will get help right away if you are not doing well or get worse.   This information is not intended to replace advice given to you by  your health care provider. Make sure you discuss any questions you have with your health care provider.   Document Released: 01/15/2005 Document Revised: 02/05/2014 Document Reviewed: 09/15/2012 Elsevier Interactive Patient Education [employer].

## 2015-08-18 NOTE — ED Notes (Signed)
Pt   Reports  l lower abd  Pain    With pain  llq      Onset of  Symptoms  For  About  1  Week       Pt denys   Any      Vaginal  Bleeding         denys  Any  Discharge  - pt  States  She  Has not  Taken her  bp  meds  In  Several  Days       Pt   Is awake  And  Alert and  Oriented   Skin  Is  Warm  And  Dry

## 2015-08-18 NOTE — ED Provider Notes (Signed)
CSN: [id number]     Arrival date & time 08/18/15  1110 History   First MD Initiated Contact with Patient 08/18/15 1148     Chief Complaint  Patient presents with  . Abdominal Pain   (Consider location/radiation/quality/duration/timing/severity/associated sxs/prior Treatment) HPI Comments: Patient presents with left lower abdominal pain and left flank pain for the past week. No distinct dysuria, vaginal bleeding or discharge. No recent UTI. No history of kidney stones. Slight fever. She has taken Ibuprofen with minimal relief. Her BP is elevated today. Patient has not taken her BP meds in several days but denies any chest pain, palpitations, vision changes or headaches.   Patient is a 40 y.o. female presenting with abdominal pain. The history is provided by the patient.  Abdominal Pain Pain location:  LLQ Pain quality: aching and pressure   Pain radiates to:  Suprapubic region and L flank Pain severity:  Mild Onset quality:  Gradual Duration:  1 week Timing:  Constant Progression:  Worsening Chronicity:  New Relieved by:  Nothing Associated symptoms: fatigue, fever and nausea   Associated symptoms: no chest pain, no chills, no dysuria, no hematuria, no vaginal bleeding and no vaginal discharge     Past Medical History  Diagnosis Date  . Hypertension   . Asthma    History reviewed. No pertinent past surgical history. No family history on file. Social History  Substance Use Topics  . Smoking status: Current Every Day Smoker  . Smokeless tobacco: None  . Alcohol Use: No   OB History    No data available     Review of Systems  Constitutional: Positive for fever and fatigue. Negative for chills and diaphoresis.  Cardiovascular: Negative for chest pain.  Gastrointestinal: Positive for nausea and abdominal pain.  Genitourinary: Positive for flank pain. Negative for dysuria, hematuria, vaginal bleeding and vaginal discharge.    Allergies  Review of patient's allergies  indicates no known allergies.  Home Medications   Prior to Admission medications   Medication Sig Start Date End Date Taking? Authorizing Provider  albuterol (PROVENTIL HFA;VENTOLIN HFA) 108 (90 BASE) MCG/ACT inhaler Inhale 1-2 puffs into the lungs every 4 (four) hours as needed for wheezing or shortness of breath. 08/05/13   Lutricia Feil, PA  ALBUTEROL IN Inhale into the lungs.    Historical Provider, MD  amLODipine (NORVASC) 10 MG tablet Take 10 mg by mouth daily.    Historical Provider, MD  lisinopril (PRINIVIL,ZESTRIL) 20 MG tablet Take 20 mg by mouth daily.    Historical Provider, MD  nitrofurantoin (MACRODANTIN) 100 MG capsule Take 1 capsule (100 mg total) by mouth 2 (two) times daily. For 7 days. 08/18/15   Katy Apo, NP   Meds Ordered and Administered this Visit  Medications - No data to display  BP 192/133 mmHg  Pulse 90  Temp(Src) 99.2 F (37.3 C) (Oral)  Resp 18  SpO2 98%  LMP 08/03/2015 No data found.   Physical Exam  Constitutional: She is oriented to person, place, and time. She appears well-developed and well-nourished. No distress.  Cardiovascular: Normal rate, regular rhythm and normal heart sounds.   Pulmonary/Chest: Effort normal and breath sounds normal.  Abdominal: Soft. Normal appearance and bowel sounds are normal. She exhibits no mass. There is tenderness in the suprapubic area and left lower quadrant. There is CVA tenderness (Left side). There is no rebound and no guarding.  Neurological: She is alert and oriented to person, place, and time.  Skin: Skin is warm  and dry.  Psychiatric: She has a normal mood and affect. Her behavior is normal. Judgment and thought content normal.    ED Course  Procedures (including critical care time)  Labs Review Labs Reviewed  URINE CULTURE - Abnormal; Notable for the following:    Culture MULTIPLE SPECIES PRESENT, SUGGEST RECOLLECTION (*)    All other components within normal limits  POCT URINALYSIS DIP  (DEVICE) - Abnormal; Notable for the following:    Hgb urine dipstick SMALL (*)    Leukocytes, UA TRACE (*)    All other components within normal limits  POCT PREGNANCY, URINE    Imaging Review No results found.   Visual Acuity Review  Right Eye Distance:   Left Eye Distance:   Bilateral Distance:    Right Eye Near:   Left Eye Near:    Bilateral Near:         MDM   1. Hematuria   2. Cystitis    Reviewed urinalysis results with patient. Discussed possible UTI - early pyelonephritis due to lab and clinical findings. Also discussed possibility of kidney stones. Urine sent for culture. Start Nitrofurantoin as directed. Continue to monitor symptoms- if increased pain or fever occur, go to ER. Otherwise follow-up pending urine culture results.  Also discussed elevated blood pressure. Patient will restart BP meds today and continue to monitor. Follow-up if BP remains elevated.      Katy Apo, NP 08/19/15 [phone]

## 2015-08-19 LAB — URINE CULTURE: Special Requests: NORMAL

## 2015-11-08 ENCOUNTER — Encounter (HOSPITAL_COMMUNITY): Payer: Self-pay | Admitting: Emergency Medicine

## 2015-11-08 ENCOUNTER — Ambulatory Visit (INDEPENDENT_AMBULATORY_CARE_PROVIDER_SITE_OTHER): Payer: Self-pay

## 2015-11-08 ENCOUNTER — Ambulatory Visit (HOSPITAL_COMMUNITY)
Admission: EM | Admit: 2015-11-08 | Discharge: 2015-11-08 | Disposition: A | Discharge status: Home / Self Care | Payer: Self-pay | Attending: Emergency Medicine | Admitting: Emergency Medicine

## 2015-11-08 DIAGNOSIS — Z8679 Personal history of other diseases of the circulatory system: Secondary | ICD-10-CM

## 2015-11-08 DIAGNOSIS — J4541 Moderate persistent asthma with (acute) exacerbation: Secondary | ICD-10-CM

## 2015-11-08 DIAGNOSIS — Z760 Encounter for issue of repeat prescription: Secondary | ICD-10-CM

## 2015-11-08 HISTORY — DX: Sarcoidosis, unspecified: D86.9

## 2015-11-08 MED ORDER — IPRATROPIUM-ALBUTEROL 0.5-2.5 (3) MG/3ML IN SOLN
RESPIRATORY_TRACT | Status: AC
  Filled 2015-11-08: qty 3

## 2015-11-08 MED ORDER — IPRATROPIUM-ALBUTEROL 0.5-2.5 (3) MG/3ML IN SOLN
3.0000 mL | Freq: Once | RESPIRATORY_TRACT | Status: AC
  Administered 2015-11-08: 3 mL via RESPIRATORY_TRACT

## 2015-11-08 MED ORDER — ALBUTEROL SULFATE HFA 108 (90 BASE) MCG/ACT IN AERS
1.0000 | INHALATION_SPRAY | Freq: Four times a day (QID) | RESPIRATORY_TRACT | 0 refills | Status: DC | PRN
Start: 2015-11-08 — End: 2016-08-22

## 2015-11-08 MED ORDER — AMLODIPINE BESYLATE 10 MG PO TABS: 10.0000 mg | ORAL_TABLET | Freq: Every day | ORAL | 0 refills | Status: DC

## 2015-11-08 MED ORDER — METHYLPREDNISOLONE ACETATE 80 MG/ML IJ SUSP
INTRAMUSCULAR | Status: AC
  Filled 2015-11-08: qty 1

## 2015-11-08 MED ORDER — METHYLPREDNISOLONE ACETATE 80 MG/ML IJ SUSP
80.0000 mg | Freq: Once | INTRAMUSCULAR | Status: AC
  Administered 2015-11-08: 80 mg via INTRAMUSCULAR

## 2015-11-08 MED ORDER — PREDNISONE 20 MG PO TABS: ORAL_TABLET | ORAL | 0 refills | Status: DC

## 2015-11-08 NOTE — Discharge Instructions (Signed)
°Emergency Department Resource Guide °1) Find a Doctor and Pay Out of Pocket °Although you won't have to find out who is covered by your insurance plan, it is a good idea to ask around and get recommendations. You will then need to call the office and see if the doctor you have chosen will accept you as a new patient and what types of options they offer for patients who are self-pay. Some doctors offer discounts or will set up payment plans for their patients who do not have insurance, but you will need to ask so you aren't surprised when you get to your appointment. ° °2) Contact Your Local Health Department °Not all health departments have doctors that can see patients for sick visits, but many do, so it is worth a call to see if yours does. If you don't know where your local health department is, you can check in your phone book. The CDC also has a tool to help you locate your state's health department, and many state websites also have listings of all of their local health departments. ° °3) Find a Walk-in Clinic °If your illness is not likely to be very severe or complicated, you may want to try a walk in clinic. These are popping up all over the country in pharmacies, drugstores, and shopping centers. They're usually staffed by nurse practitioners or physician assistants that have been trained to treat common illnesses and complaints. They're usually fairly quick and inexpensive. However, if you have serious medical issues or chronic medical problems, these are probably not your best option. ° °No Primary Care Doctor: °- Call [facility] at  832-8000 - they can help you locate a primary care doctor that  accepts your insurance, provides certain services, etc. °- Physician Referral Service- 1-800-533-3463 ° °Chronic Pain Problems: °Organization         Address  Phone   Notes  °[facility]  (336) 297-2271 Patients need to be referred by their primary care doctor.  ° °Medication  Assistance: °Organization         Address  Phone   Notes  °[place] [facility] [address] °[place], [place] [address] (336) 641-8030 --Must be a resident of [place] °-- Must have NO insurance coverage whatsoever (no Medicaid/ Medicare, etc.) °-- The pt. MUST have a primary care doctor that directs their care regularly and follows them in the community °  °[facility]  (866) 331-1348   °[employer]  (888) 892-1162   ° °Agencies that provide inexpensive medical care: °Organization         Address  Phone   Notes  °[facility]  (336) 832-8035   °[facility]    (336) 832-7272   °[facility] [address] °[place], [place] [address] (336) 832-4777   °[facility] [address], °[place] (336) 271-4999   °[facility]    (336) 373-0678   °[facility]    (336) 272-1050   °[facility] ° [address], [place] Phone:  (336) 832-4444, Fax:  (336) 832-4440 Hours of Operation:  9 am - 6 pm, M-F.  Also accepts Medicaid/Medicare and self-pay.  °[facility] ° [address], [place] Phone: (336) [phone], Fax: (336) [phone]. Hours of Operation:  8:30 am - 5:30 pm, M-F.  Also accepts Medicaid and self-pay.  °[facility] [place] [address], [place] Phone: (336) 878-6027   °[facility] [address], [place], [place] (336)723-1848, Ext. 123 Mondays & Thursdays: 7-9 AM.  First 15 patients are seen on a first come, first serve basis. °  ° °Medicaid-accepting [place] Providers: ° °Organization         Address  Phone   Notes  °[facility] [address], [place] (336) 641-2100 Also accepts self-pay patients.  °[facility] [address], [place] ° (336) 856-9996   °[facility] [address], [place]  (336) 288-8857   °[facility] [address] [place] [address], [place] (336) 299-7000   °Veita Bland [address], [place]  ° (336) 373-1557 Only accepts [place] Access Medicaid patients after they have their name applied to their card.  ° °Self-Pay (no insurance) in [place]: ° °Organization         Address  Phone   Notes  °Sickle Cell Patients, [facility] [address], [place] (336) 832-1970   °[facility] [address], [place] (336) 832-4400   °[facility] [place] [facility] ° [address], [place] (336) 992-4800   °[facility]/Dr. Osei-Bonsu ° [address] [place] [address], [place] or [address], [place] (336) 841-8500 Phone number for both [place] and [place] locations is the same.  °[facility] [address], [place] (336) 299-0000   °[facility] [address] [place] [address], [place] or [address] (336) 852-7530 °(336) 878-2260   °[facility] [address], [place] (336) 350-1642, phone; (336) 294-5005, fax Sees patients 1st and 3rd Saturday of every month.  Must not qualify for public or private insurance (i.e. Medicaid, Medicare, [place] Health Choice, Veterans' Benefits) • Household income should be no more than 200% of the poverty level •The clinic cannot treat you if you are pregnant or think you are pregnant • Sexually transmitted diseases are not treated at the clinic.  ° ° °Dental Care: °Organization         Address  Phone  Notes  °[place] [facility] [address], [place] (336) 641-6152 Accepts children up to age 21 who are enrolled in Medicaid or [place] Health Choice; pregnant women with a Medicaid card; and children who have applied for Medicaid or [place] Health Choice, but were declined, whose parents can pay a reduced fee at time of service.  °[place]  [facility] [place]  [address], [place] (336) 641-7733 Accepts children up to age 21 who are enrolled in Medicaid or [place] Health Choice; pregnant women with a Medicaid card; and children who have applied for Medicaid or [place] Health Choice, but were declined, whose parents can pay a reduced fee at time of service.  °[facility] ° [address], [place] (336) 641-4533 Patients are seen by appointment only. Walk-ins are not accepted. [facility] will see patients 18 years of age and older. °Monday - Tuesday (8am-5pm) °Most Wednesdays (8:30-5pm) °$30 per visit, cash only  °[facility] ° [address], [place] (336) 641-4533 Patients are seen by appointment only. Walk-ins are not accepted. [facility] will see patients 18 years of age and older. °One   Wednesday Evening (Monthly: Volunteer Based).  $30 per visit, cash only  °[facility]  (919) 537-3737 for adults; Children under age 4, call Graduate Pediatric Dentistry at ([phone]) [phone]. Children aged 4-14, please call (919) [phone] to request a pediatric application. ° Dental services are provided in all areas of dental care including fillings, crowns and bridges, complete and partial dentures, implants, gum treatment, root canals, and extractions. Preventive care is also provided. Treatment is provided to both adults and children. °Patients are selected via a lottery and there is often a waiting list. °  °[facility] [address], °[place] ° (336) 763-8833 [email or web address] °  °[facility] [address], [place], [place] (336)723-1848, Ext. 123 Second and Fourth Thursday of each month, opens at 6:30 AM; Clinic ends at 9 AM.  Patients are seen on a first-come first-served basis, and a limited number are seen during each clinic.  ° °[facility] ° [address], [place], [place] (336) 723-7904    Eligibility Requirements °You must have lived in [place], [place], or [place] counties for at least the last three months. °  You cannot be eligible for state or federal sponsored healthcare insurance, including Veterans Administration, Medicaid, or Medicare. °  You generally cannot be eligible for healthcare insurance through your employer.  °  How to apply: °Eligibility screenings are held every Tuesday and Wednesday afternoon from 1:00 pm until 4:00 pm. You do not need an appointment for the interview!  °[facility] [address], [place], [place] 336-631-2330   °[facility] [facility]  336-342-8273   °[facility]  336-703-3100   °[place] [facility]  336-570-6415   ° °Behavioral Health Resources in the Community: °Intensive Outpatient Programs °Organization         Address  Phone  Notes  °[facility] [address], [place], [place] 336-878-6098   °[facility] [address], [place], [place] 336-832-9800   °[facility] [address], [place], [place] ° 336-882-2125   °[facility] [address],  °[place], [place] 1-800-853-5163 or 336-641-4981   °Substance Abuse Resources °Organization         Address  Phone  Notes  °[facility]  336-882-2125   °[facility]  336-784-9470   °[facility]  336-285-9073   °[facility]  336-845-3988   °Residential & Outpatient Substance Abuse Program  1-800-659-3381   °Psychological Services °Organization         Address  Phone  Notes  ° Health  336- 832-9600   °[facility]  336- 378-7881   °[facility] [address], [place] 1-800-853-5163 or 336-641-4981   ° °Mobile Crisis Teams °Organization         Address  Phone  Notes  °[facility], Mobile Crisis Care Unit  1-877-626-1772   °Assertive °[facility] ° [address]  [place], [place] 336-834-9664   °Sharon DeEsch [address] °[place] 336-554-5454   ° °Self-Help/Support Groups °Organization         Address  Phone             Notes  °[facility] - variety of support groups  336- 373-1402 Call for more information  °Narcotics Anonymous (NA), [facility] [address], °[place]  2 meetings at this location  ° °  Residential Treatment Programs °Organization         Address  Phone  Notes  °[facility] [address],    °[place]  1-866-801-8205   °[facility] ° [address], [place], [place] 704-293-8524   °[facility] [address], [place] 336-845-3988 Admissions: 8am-3pm M-F  °[facility] [address]    °[place], [place] 336-841-1104   °[facility] [address], [place], [place] 336-379-7146   °[facility] [address]  °[place], [place] 336-285-9073   °[facility] - Intensive Outpatient [address], [place], [place] 336-852-3033   °[facility] [address]  °[place], [place] 1-877-615-2722 or 336-784-9470   °[facility] [address] [place], [place] 336-227-7417 Accepts Medicaid  °[facility] [address]  °[place] 1-800-659-3381 Substance Abuse/Addiction Treatment  ° °[place] Behavioral Health Resources °Organization         Address  Phone  Notes  °[facility]  (888) 581-9988   °Julie Brannon, PhD [address] [place], [place]   (336) 349-5553 or (336) 951-0000   °[facility]   [address] °[place], [place] (336) 349-4454   °[facility] [address], [place], [place] (336) 342-8316 Insurance/Medicaid/sponsorship through [facility]  °[facility] [address]                                    [place], [place] (336) 342-8316 Therapy/tele-psych/case    °[facility] [address]  ° [place], [place] (336) 349-2233    °Dr. Arfeen  (336) 349-4544   °[facility] [place]  [employer] [place] [facility] 1) [address], [place] °2) [address], [place] °3)  [address], [place] (336) 349-3220 °(336) 342-7768 ° °(336) 342-8140   °[place] Child Abuse [facility] (336) 342-1394 or (336) 342-3537 (After Hours)    ° ° °

## 2015-11-08 NOTE — ED Provider Notes (Signed)
CSN: [id number]     Arrival date & time 11/08/15  1625 History   First MD Initiated Contact with Patient 11/08/15 1727     Chief Complaint  Patient presents with  . Cough   (Consider location/radiation/quality/duration/timing/severity/associated sxs/prior Treatment) HPI Teresa Mendez is a 40 y.o. female presenting to UC with c/o 1 week of worsening cough, congestion, and bilateral lower extremity swelling.  She reports hx of asthma and Sarcoidosis and has been using her inhaler more often. She wants to make sure she is not developing pneumonia.  She has associated chest tightness and wheeze. She notes she has also been out of her amlodipine for about 2 weeks but has been taking her Lisinopril.  Denies fever, chills, n/v/d.   She is currently looking for a new PCP as she recently lost her Medicaid.    Past Medical History:  Diagnosis Date  . Asthma   . Hypertension   . Sarcoidosis ([place])    History reviewed. No pertinent surgical history. History reviewed. No pertinent family history. Social History  Substance Use Topics  . Smoking status: Former Smoker    Packs/day: 0.50    Types: Cigarettes  . Smokeless tobacco: Never Used  . Alcohol use No   OB History    No data available     Review of Systems  Constitutional: Negative for chills and fever.  HENT: Positive for congestion, postnasal drip and rhinorrhea. Negative for ear pain, sore throat, trouble swallowing and voice change.   Respiratory: Positive for cough, chest tightness, shortness of breath and wheezing.   Cardiovascular: Positive for leg swelling (bilateral, lower leg). Negative for chest pain and palpitations.  Gastrointestinal: Negative for abdominal pain, diarrhea, nausea and vomiting.  Musculoskeletal: Negative for arthralgias, back pain and myalgias.  Skin: Negative for rash.    Allergies  Review of patient's allergies indicates no known allergies.  Home Medications   Prior to Admission medications    Medication Sig Start Date End Date Taking? Authorizing Provider  lisinopril (PRINIVIL,ZESTRIL) 20 MG tablet Take 20 mg by mouth daily.   Yes Historical Provider, MD  albuterol (PROVENTIL HFA;VENTOLIN HFA) 108 (90 BASE) MCG/ACT inhaler Inhale 1-2 puffs into the lungs every 4 (four) hours as needed for wheezing or shortness of breath. 08/05/13   Audelia Hives Presson, PA  albuterol (PROVENTIL HFA;VENTOLIN HFA) 108 (90 Base) MCG/ACT inhaler Inhale 1-2 puffs into the lungs every 6 (six) hours as needed for wheezing or shortness of breath. 11/08/15   Noland Fordyce, PA-C  ALBUTEROL IN Inhale into the lungs.    Historical Provider, MD  amLODipine (NORVASC) 10 MG tablet Take 1 tablet (10 mg total) by mouth daily. 11/08/15   Noland Fordyce, PA-C  nitrofurantoin (MACRODANTIN) 100 MG capsule Take 1 capsule (100 mg total) by mouth 2 (two) times daily. For 7 days. 08/18/15   Katy Apo, NP  predniSONE (DELTASONE) 20 MG tablet 3 tabs po day one, then 2 po daily x 4 days 11/08/15   Noland Fordyce, PA-C   Meds Ordered and Administered this Visit   Medications  methylPREDNISolone acetate (DEPO-MEDROL) injection 80 mg (80 mg Intramuscular Given 11/08/15 1743)  ipratropium-albuterol (DUONEB) 0.5-2.5 (3) MG/3ML nebulizer solution 3 mL (3 mLs Nebulization Given 11/08/15 1743)    BP (!) 182/111 (BP Location: Right Arm)   Pulse 93   Temp 98.7 F (37.1 C) (Oral)   Resp 20   LMP 11/01/2015 (Exact Date)   SpO2 98%  No data found.   Physical  Exam  Constitutional: She is oriented to person, place, and time. She appears well-developed and well-nourished. No distress.  HENT:  Head: Normocephalic and atraumatic.  Right Ear: Tympanic membrane normal.  Left Ear: Tympanic membrane normal.  Nose: Nose normal.  Mouth/Throat: Uvula is midline, oropharynx is clear and moist and mucous membranes are normal.  Eyes: EOM are normal.  Neck: Normal range of motion.  Cardiovascular: Normal rate and regular rhythm.    Pulmonary/Chest: Effort normal. No respiratory distress. She has wheezes. She has rhonchi (faint, diffuse). She has no rales.  Faint diffuse coarse breath sounds with wheeze. No respiratory distress, able to speak in full sentences w/o difficulty.  Abdominal: Soft. She exhibits no distension. There is no tenderness.  Musculoskeletal: Normal range of motion. She exhibits edema. She exhibits no tenderness.  Bilateral 1+ lower leg edema.  Neurological: She is alert and oriented to person, place, and time.  Skin: Skin is warm and dry. She is not diaphoretic.  Psychiatric: She has a normal mood and affect. Her behavior is normal.  Nursing note and vitals reviewed.   Urgent Care Course   Clinical Course    Procedures (including critical care time)  Labs Review Labs Reviewed - No data to display  Imaging Review Dg Chest 2 View  Result Date: 11/08/2015 CLINICAL DATA:  Wheezing, chest tightness, cough, short of breath. History sarcoidosis and asthma. EXAM: CHEST  2 VIEW COMPARISON:  11/11/2007 FINDINGS: Heart size and vascularity normal.  Negative for heart failure. Diffuse pulmonary scarring. Apical bleb formation on the right is stable. No superimposed infiltrate or effusion. No mass lesion. IMPRESSION: Chronic lung disease.  No superimposed acute abnormality. Electronically Signed   By: Franchot Gallo M.D.   On: 11/08/2015 18:04    MDM   1. Moderate persistent asthma with exacerbation   2. Medication refill   3. History of uncontrolled hypertension    Pt c/o worsening cough with congestion. Hx of asthma. Pt also c/o bilateral leg swelling. She has been off her amlodipine for about 2 weeks. BP- 182/111. BP has been elevated on multiple occasions including 192/133 on 08/18/15 Denies headache or change in vision.   Denies chest pain but does have chest tightness. Wheeze noted on exam.  Demomedrol and Duoneb given in UC- pt feels improved. Lungs sounds improved, rhonchi resolved, faint  expiratory wheeze still present. O2 Sat 98% on RA  CXR: chronic lung disease w/o superimposed acute abnormality  Rx: Prednisone, albuterol, and refill of amlodipine F/u with PCP in 1 week for recheck of symptoms.    Noland Fordyce, PA-C 11/08/15 [id number]

## 2015-11-08 NOTE — ED Triage Notes (Signed)
The patient presented to the [facility] with a complaint of a cough and lower extremity swelling x 1 week. The patient also reported that she was out of her amlodipine and has not had a dose in 2 weeks.

## 2016-08-22 ENCOUNTER — Ambulatory Visit: Payer: Self-pay | Attending: Family Medicine | Admitting: Family Medicine

## 2016-08-22 ENCOUNTER — Encounter: Payer: Self-pay | Admitting: Family Medicine

## 2016-08-22 VITALS — BP 149/89 | HR 75 | Temp 98.5°F | Resp 18 | Ht 64.0 in | Wt 216.2 lb

## 2016-08-22 DIAGNOSIS — M545 Low back pain, unspecified: Secondary | ICD-10-CM

## 2016-08-22 DIAGNOSIS — H538 Other visual disturbances: Secondary | ICD-10-CM | POA: Insufficient documentation

## 2016-08-22 DIAGNOSIS — Z79899 Other long term (current) drug therapy: Secondary | ICD-10-CM | POA: Insufficient documentation

## 2016-08-22 DIAGNOSIS — G8929 Other chronic pain: Secondary | ICD-10-CM | POA: Insufficient documentation

## 2016-08-22 DIAGNOSIS — I10 Essential (primary) hypertension: Secondary | ICD-10-CM | POA: Insufficient documentation

## 2016-08-22 DIAGNOSIS — R350 Frequency of micturition: Secondary | ICD-10-CM

## 2016-08-22 DIAGNOSIS — J45909 Unspecified asthma, uncomplicated: Secondary | ICD-10-CM | POA: Insufficient documentation

## 2016-08-22 LAB — POCT URINALYSIS DIPSTICK
Bilirubin, UA: NEGATIVE
GLUCOSE UA: NEGATIVE
KETONES UA: NEGATIVE
LEUKOCYTES UA: NEGATIVE
Nitrite, UA: NEGATIVE
PROTEIN UA: NEGATIVE
SPEC GRAV UA: 1.015 (ref 1.010–1.025)
Urobilinogen, UA: 0.2 E.U./dL
pH, UA: 6.5 (ref 5.0–8.0)

## 2016-08-22 LAB — POCT GLYCOSYLATED HEMOGLOBIN (HGB A1C): Hemoglobin A1C: 5

## 2016-08-22 LAB — POCT UA - MICROALBUMIN
CREATININE, POC: 200 mg/dL
MICROALBUMIN (UR) POC: 80 mg/L

## 2016-08-22 MED ORDER — AMLODIPINE BESYLATE 10 MG PO TABS: 10.0000 mg | ORAL_TABLET | Freq: Every day | ORAL | 2 refills | Status: DC

## 2016-08-22 MED ORDER — LISINOPRIL 20 MG PO TABS: 20.0000 mg | ORAL_TABLET | Freq: Every day | ORAL | 2 refills | Status: DC

## 2016-08-22 MED ORDER — ACETAMINOPHEN 500 MG PO TABS: 1000.0000 mg | ORAL_TABLET | Freq: Three times a day (TID) | ORAL | 0 refills | Status: DC | PRN

## 2016-08-22 MED ORDER — ALBUTEROL SULFATE HFA 108 (90 BASE) MCG/ACT IN AERS: 1.0000 | INHALATION_SPRAY | Freq: Four times a day (QID) | RESPIRATORY_TRACT | 3 refills | Status: DC | PRN

## 2016-08-22 NOTE — Patient Instructions (Signed)
Fat and Cholesterol Restricted Diet Getting too much fat and cholesterol in your diet may cause health problems. Following this diet helps keep your fat and cholesterol at normal levels. This can keep you from getting sick. What types of fat should I choose?  Choose monosaturated and polyunsaturated fats. These are found in foods such as olive oil, canola oil, flaxseeds, walnuts, almonds, and seeds.  Eat more omega-3 fats. Good choices include salmon, mackerel, sardines, tuna, flaxseed oil, and ground flaxseeds.  Limit saturated fats. These are in animal products such as meats, butter, and cream. They can also be in plant products such as palm oil, palm kernel oil, and coconut oil.  Avoid foods with partially hydrogenated oils in them. These contain trans fats. Examples of foods that have trans fats are stick margarine, some tub margarines, cookies, crackers, and other baked goods. What general guidelines do I need to follow?  Check food labels. Look for the words "trans fat" and "saturated fat."  When preparing a meal: ? Fill half of your plate with vegetables and green salads. ? Fill one fourth of your plate with whole grains. Look for the word "whole" as the first word in the ingredient list. ? Fill one fourth of your plate with lean protein foods.  Eat more foods that have fiber, like apples, carrots, beans, peas, and barley.  Eat more home-cooked foods. Eat less at restaurants and buffets.  Limit or avoid alcohol.  Limit foods high in starch and sugar.  Limit fried foods.  Cook foods without frying them. Baking, boiling, grilling, and broiling are all great options.  Lose weight if you are overweight. Losing even a small amount of weight can help your overall health. It can also help prevent diseases such as diabetes and heart disease. What foods can I eat? Grains Whole grains, such as whole wheat or whole grain breads, crackers, cereals, and pasta. Unsweetened oatmeal,  bulgur, barley, quinoa, or brown rice. Corn or whole wheat flour tortillas. Vegetables Fresh or frozen vegetables (raw, steamed, roasted, or grilled). Green salads. Fruits All fresh, canned (in natural juice), or frozen fruits. Meat and Other Protein Products Ground beef (85% or leaner), grass-fed beef, or beef trimmed of fat. Skinless chicken or turkey. Ground chicken or turkey. Pork trimmed of fat. All fish and seafood. Eggs. Dried beans, peas, or lentils. Unsalted nuts or seeds. Unsalted canned or dry beans. Dairy Low-fat dairy products, such as skim or 1% milk, 2% or reduced-fat cheeses, low-fat ricotta or cottage cheese, or plain low-fat yogurt. Fats and Oils Tub margarines without trans fats. Light or reduced-fat mayonnaise and salad dressings. Avocado. Olive, canola, sesame, or safflower oils. Natural peanut or almond butter (choose ones without added sugar and oil). The items listed above may not be a complete list of recommended foods or beverages. Contact your dietitian for more options. What foods are not recommended? Grains White bread. White pasta. White rice. Cornbread. Bagels, pastries, and croissants. Crackers that contain trans fat. Vegetables White potatoes. Corn. Creamed or fried vegetables. Vegetables in a cheese sauce. Fruits Dried fruits. Canned fruit in light or heavy syrup. Fruit juice. Meat and Other Protein Products Fatty cuts of meat. Ribs, chicken wings, bacon, sausage, bologna, salami, chitterlings, fatback, hot dogs, bratwurst, and packaged luncheon meats. Liver and organ meats. Dairy Whole or 2% milk, cream, half-and-half, and cream cheese. Whole milk cheeses. Whole-fat or sweetened yogurt. Full-fat cheeses. Nondairy creamers and whipped toppings. Processed cheese, cheese spreads, or cheese curds. Sweets and Desserts Corn   syrup, sugars, honey, and molasses. Candy. Jam and jelly. Syrup. Sweetened cereals. Cookies, pies, cakes, donuts, muffins, and ice  cream. Fats and Oils Butter, stick margarine, lard, shortening, ghee, or bacon fat. Coconut, palm kernel, or palm oils. Beverages Alcohol. Sweetened drinks (such as sodas, lemonade, and fruit drinks or punches). The items listed above may not be a complete list of foods and beverages to avoid. Contact your dietitian for more information. This information is not intended to replace advice given to you by your health care provider. Make sure you discuss any questions you have with your health care provider. Document Released: 07/17/2011 Document Revised: 09/22/2015 Document Reviewed: 04/16/2013 Elsevier Interactive Patient Education  2018 [employer]  

## 2016-08-22 NOTE — Progress Notes (Signed)
Subjective:  Patient ID: Teresa Mendez, female    DOB: December 05, 1975  Age: 41 y.o. MRN: [id number]  CC: Hypertension   HPI TALIANA MERSEREAU presents for hypertension. PHM includes hypertension and asthma. She is not exercising and is not adherent to low salt diet.  She does not check BP at home.She reports being without her lisinopril and amlodipine for several months. She does report taking and bring with her chlorthalidone. Prescription was last filled in March. She reports not taking them regularly to extend use. Cardiac symptoms none. Patient denies chest pain, claudication, dyspnea, near-syncope, palpitations and syncope.  Cardiovascular risk factors: hypertension, sedentary lifestyle and smoking/ tobacco exposure. Use of agents associated with hypertension: none. History of target organ damage: none. She complains of chronic low back pain. The patient first noted symptoms 16monthago. The pain is rated mild, and is located at the lumbar area. The pain is described as aching and occurs intermittently. She denies symptoms have been progressive. Symptoms are exacerbated by flexion. She denie taking anyting for symptoms. Other associated symptoms include urinary frequency. History of asthma. Patient denies dyspnea, wheezing and cough. Medications used in the past to treat asthma include beta agonist inhalers. Patient has not required Emergency Room treatment for these symptoms and has not required recent hospitalization. History of blurred vision she reports being evaluated by eye specialist about 4 months ago. She reports 1 month of worsening blurred vision.   Outpatient Medications Prior to Visit  Medication Sig Dispense Refill  . ALBUTEROL IN Inhale into the lungs.    . nitrofurantoin (MACRODANTIN) 100 MG capsule Take 1 capsule (100 mg total) by mouth 2 (two) times daily. For 7 days. 14 capsule 0  . predniSONE (DELTASONE) 20 MG tablet 3 tabs po day one, then 2 po daily x 4 days 11 tablet 0  .  albuterol (PROVENTIL HFA;VENTOLIN HFA) 108 (90 BASE) MCG/ACT inhaler Inhale 1-2 puffs into the lungs every 4 (four) hours as needed for wheezing or shortness of breath. 1 Inhaler 0  . albuterol (PROVENTIL HFA;VENTOLIN HFA) 108 (90 Base) MCG/ACT inhaler Inhale 1-2 puffs into the lungs every 6 (six) hours as needed for wheezing or shortness of breath. 1 Inhaler 0  . amLODipine (NORVASC) 10 MG tablet Take 1 tablet (10 mg total) by mouth daily. 30 tablet 0  . lisinopril (PRINIVIL,ZESTRIL) 20 MG tablet Take 20 mg by mouth daily.     No facility-administered medications prior to visit.     ROS Review of Systems  Eyes: Positive for visual disturbance.  Respiratory: Negative.   Cardiovascular: Negative.   Gastrointestinal: Negative.   Genitourinary: Positive for frequency.  Musculoskeletal: Positive for back pain.  Neurological: Negative.     Objective:  BP (!) 149/89 (BP Location: Left Arm, Patient Position: Sitting, Cuff Size: Normal)   Pulse 75   Temp 98.5 F (36.9 C) (Oral)   Resp 18   Ht 5' 4"  (1.626 m)   Wt 216 lb 3.2 oz (98.1 kg)   SpO2 98%   BMI 37.11 kg/m   BP/Weight 08/22/2016 11/08/2015 72/69/4854 Systolic BP [id number] Diastolic BP 89 [id number] Wt. (Lbs) 216.2 - -  BMI 37.11 - -     Physical Exam  Constitutional: She appears well-developed and well-nourished.  Eyes: Pupils are equal, round, and reactive to light. Conjunctivae are normal.  Neck: No JVD present.  Cardiovascular: Normal rate, regular rhythm, normal heart sounds and intact distal pulses.   Pulmonary/Chest: Effort normal  and breath sounds normal.  Abdominal: Soft. Bowel sounds are normal. There is no tenderness.  Musculoskeletal:       Lumbar back: She exhibits pain.  Skin: Skin is warm and dry.  Nursing note and vitals reviewed.   Assessment & Plan:   Problem List Items Addressed This Visit      Cardiovascular and Mediastinum   Essential hypertension - Primary   Schedule BP recheck in 2 weeks  with nurse.   If BP is greater than 90/60 (MAP 65 or greater) but not less than 130/80 may increase dose of lisinopril    to 40 mg QD and recheck in another 2 weeks   Follow up with PCP in 3 months   Relevant Medications   amLODipine (NORVASC) 10 MG tablet   lisinopril (PRINIVIL,ZESTRIL) 20 MG tablet   Other Relevant Orders   CMP14+EGFR (Completed)   Lipid Panel (Completed)   POCT UA - Microalbumin (Completed)     Respiratory   Intrinsic asthma   Relevant Medications   albuterol (PROVENTIL HFA;VENTOLIN HFA) 108 (90 Base) MCG/ACT inhaler    Other Visit Diagnoses    Blurred vision, bilateral       Relevant Orders   Ambulatory referral to Ophthalmology   POCT glycosylated hemoglobin (Hb A1C) (Completed)   Acute bilateral low back pain without sciatica       Relevant Medications   acetaminophen (TYLENOL) 500 MG tablet   Urine frequency       Relevant Orders   Urinalysis Dipstick (Completed)      Meds ordered this encounter  Medications  . albuterol (PROVENTIL HFA;VENTOLIN HFA) 108 (90 Base) MCG/ACT inhaler    Sig: Inhale 1-2 puffs into the lungs every 6 (six) hours as needed for wheezing or shortness of breath.    Dispense:  1 Inhaler    Refill:  3    Order Specific Question:   Supervising Provider    Answer:   Tresa Garter [id number]  . amLODipine (NORVASC) 10 MG tablet    Sig: Take 1 tablet (10 mg total) by mouth daily.    Dispense:  30 tablet    Refill:  2    Order Specific Question:   Supervising Provider    Answer:   Tresa Garter [id number]  . lisinopril (PRINIVIL,ZESTRIL) 20 MG tablet    Sig: Take 1 tablet (20 mg total) by mouth daily.    Dispense:  30 tablet    Refill:  2    Order Specific Question:   Supervising Provider    Answer:   Tresa Garter [id number]  . acetaminophen (TYLENOL) 500 MG tablet    Sig: Take 2 tablets (1,000 mg total) by mouth every 8 (eight) hours as needed for moderate pain.    Dispense:  30 tablet    Refill:  0     Order Specific Question:   Supervising Provider    Answer:   Tresa Garter [id number]    Follow-up: Return in about 2 weeks (around 09/05/2016) for BP check with [place].   Alfonse Spruce FNP

## 2016-08-22 NOTE — Progress Notes (Signed)
Patient is here for HTN   Patient complains about back pain

## 2016-08-23 LAB — CMP14+EGFR
ALT: 16 IU/L (ref 0–32)
AST: 18 IU/L (ref 0–40)
Albumin/Globulin Ratio: 1.7 (ref 1.2–2.2)
Albumin: 4 g/dL (ref 3.5–5.5)
Alkaline Phosphatase: 62 IU/L (ref 39–117)
BUN/Creatinine Ratio: 14 (ref 9–23)
BUN: 11 mg/dL (ref 6–24)
Bilirubin Total: 0.4 mg/dL (ref 0.0–1.2)
CO2: 25 mmol/L (ref 20–29)
Calcium: 9 mg/dL (ref 8.7–10.2)
Chloride: 103 mmol/L (ref 96–106)
Creatinine, Ser: 0.78 mg/dL (ref 0.57–1.00)
GFR calc Af Amer: 108 mL/min/1.73 (ref >59)
GFR calc non Af Amer: 93 mL/min/1.73 (ref >59)
Globulin, Total: 2.4 g/dL (ref 1.5–4.5)
Glucose: 98 mg/dL (ref 65–99)
Potassium: 3.5 mmol/L (ref 3.5–5.2)
Sodium: 141 mmol/L (ref 134–144)
Total Protein: 6.4 g/dL (ref 6.0–8.5)

## 2016-08-23 LAB — LIPID PANEL
CHOLESTEROL TOTAL: 146 mg/dL (ref 100–199)
Chol/HDL Ratio: 3.1 ratio (ref 0.0–4.4)
HDL: 47 mg/dL (ref >39)
LDL Calculated: 83 mg/dL (ref 0–99)
Triglycerides: 78 mg/dL (ref 0–149)
VLDL Cholesterol Cal: 16 mg/dL (ref 5–40)

## 2016-08-28 ENCOUNTER — Telehealth: Payer: Self-pay

## 2016-08-28 NOTE — Telephone Encounter (Signed)
CMA call regarding lab results   Patient verify DOB  Patient was aware and understood  

## 2016-08-28 NOTE — Telephone Encounter (Signed)
-----   Message from Alfonse Spruce, [place] sent at 08/28/2016  8:24 AM EDT ----- Kidney function normal Liver function normal Cholesterol levels are normal. Eat a diet lower in saturated fat. Limit your intake of fried foods, red meats, and whole milk. Increase activity.

## 2016-09-10 ENCOUNTER — Ambulatory Visit: Payer: Self-pay | Attending: Family Medicine | Admitting: Pharmacist

## 2016-09-10 ENCOUNTER — Encounter: Payer: Self-pay | Admitting: Pharmacist

## 2016-09-10 VITALS — BP 171/117 | HR 69

## 2016-09-10 DIAGNOSIS — I10 Essential (primary) hypertension: Secondary | ICD-10-CM | POA: Insufficient documentation

## 2016-09-10 MED ORDER — HYDROCHLOROTHIAZIDE 25 MG PO TABS: 25.0000 mg | ORAL_TABLET | Freq: Every day | ORAL | 2 refills | Status: DC

## 2016-09-10 MED ORDER — LISINOPRIL 40 MG PO TABS: 40.0000 mg | ORAL_TABLET | Freq: Every day | ORAL | 2 refills | Status: DC

## 2016-09-10 NOTE — Patient Instructions (Addendum)
Thanks for coming to see me!  Increase lisinopril to 40 mg daily.  Start hydrochlorothiazide 25 mg daily   Come back for a blood pressure check in 1 week  Check your blood pressure at home - come back sooner if it is higher than 180/100 or lower than 90/60   DASH Eating Plan DASH stands for "Dietary Approaches to Stop Hypertension." The DASH eating plan is a healthy eating plan that has been shown to reduce high blood pressure (hypertension). It may also reduce your risk for type 2 diabetes, heart disease, and stroke. The DASH eating plan may also help with weight loss. What are tips for following this plan? General guidelines  Avoid eating more than 2,300 mg (milligrams) of salt (sodium) a day. If you have hypertension, you may need to reduce your sodium intake to 1,500 mg a day.  Limit alcohol intake to no more than 1 drink a day for nonpregnant women and 2 drinks a day for men. One drink equals 12 oz of beer, 5 oz of wine, or 1 oz of hard liquor.  Work with your health care provider to maintain a healthy body weight or to lose weight. Ask what an ideal weight is for you.  Get at least 30 minutes of exercise that causes your heart to beat faster (aerobic exercise) most days of the week. Activities may include walking, swimming, or biking.  Work with your health care provider or diet and nutrition specialist (dietitian) to adjust your eating plan to your individual calorie needs. Reading food labels  Check food labels for the amount of sodium per serving. Choose foods with less than 5 percent of the Daily Value of sodium. Generally, foods with less than 300 mg of sodium per serving fit into this eating plan.  To find whole grains, look for the word "whole" as the first word in the ingredient list. Shopping  Buy products labeled as "low-sodium" or "no salt added."  Buy fresh foods. Avoid canned foods and premade or frozen meals. Cooking  Avoid adding salt when cooking. Use  salt-free seasonings or herbs instead of table salt or sea salt. Check with your health care provider or pharmacist before using salt substitutes.  Do not fry foods. Cook foods using healthy methods such as baking, boiling, grilling, and broiling instead.  Cook with heart-healthy oils, such as olive, canola, soybean, or sunflower oil. Meal planning   Eat a balanced diet that includes: ? 5 or more servings of fruits and vegetables each day. At each meal, try to fill half of your plate with fruits and vegetables. ? Up to 6-8 servings of whole grains each day. ? Less than 6 oz of lean meat, poultry, or fish each day. A 3-oz serving of meat is about the same size as a deck of cards. One egg equals 1 oz. ? 2 servings of low-fat dairy each day. ? A serving of nuts, seeds, or beans 5 times each week. ? Heart-healthy fats. Healthy fats called Omega-3 fatty acids are found in foods such as flaxseeds and coldwater fish, like sardines, salmon, and mackerel.  Limit how much you eat of the following: ? Canned or prepackaged foods. ? Food that is high in trans fat, such as fried foods. ? Food that is high in saturated fat, such as fatty meat. ? Sweets, desserts, sugary drinks, and other foods with added sugar. ? Full-fat dairy products.  Do not salt foods before eating.  Try to eat at least 2 vegetarian  meals each week.  Eat more home-cooked food and less restaurant, buffet, and fast food.  When eating at a restaurant, ask that your food be prepared with less salt or no salt, if possible. What foods are recommended? The items listed may not be a complete list. Talk with your dietitian about what dietary choices are best for you. Grains Whole-grain or whole-wheat bread. Whole-grain or whole-wheat pasta. Brown rice. [place]. Bulgur. Whole-grain and low-sodium cereals. Pita bread. Low-fat, low-sodium crackers. Whole-wheat flour tortillas. Vegetables Fresh or frozen vegetables (raw, steamed,  roasted, or grilled). Low-sodium or reduced-sodium tomato and vegetable juice. Low-sodium or reduced-sodium tomato sauce and tomato paste. Low-sodium or reduced-sodium canned vegetables. Fruits All fresh, dried, or frozen fruit. Canned fruit in natural juice (without added sugar). Meat and other protein foods Skinless chicken or [place]. Ground chicken or [place]. Pork with fat trimmed off. Fish and seafood. Egg whites. Dried beans, peas, or lentils. Unsalted nuts, nut butters, and seeds. Unsalted canned beans. Lean cuts of beef with fat trimmed off. Low-sodium, lean deli meat. Dairy Low-fat (1%) or fat-free (skim) milk. Fat-free, low-fat, or reduced-fat cheeses. Nonfat, low-sodium ricotta or cottage cheese. Low-fat or nonfat yogurt. Low-fat, low-sodium cheese. Fats and oils Soft margarine without trans fats. Vegetable oil. Low-fat, reduced-fat, or light mayonnaise and salad dressings (reduced-sodium). Canola, safflower, olive, soybean, and sunflower oils. Avocado. Seasoning and other foods Herbs. Spices. Seasoning mixes without salt. Unsalted popcorn and pretzels. Fat-free sweets. What foods are not recommended? The items listed may not be a complete list. Talk with your dietitian about what dietary choices are best for you. Grains Baked goods made with fat, such as croissants, muffins, or some breads. Dry pasta or rice meal packs. Vegetables Creamed or fried vegetables. Vegetables in a cheese sauce. Regular canned vegetables (not low-sodium or reduced-sodium). Regular canned tomato sauce and paste (not low-sodium or reduced-sodium). Regular tomato and vegetable juice (not low-sodium or reduced-sodium). Angie Fava. Olives. Fruits Canned fruit in a light or heavy syrup. Fried fruit. Fruit in cream or butter sauce. Meat and other protein foods Fatty cuts of meat. Ribs. Fried meat. Berniece Salines. Sausage. Bologna and other processed lunch meats. Salami. Fatback. Hotdogs. Bratwurst. Salted nuts and seeds. Canned  beans with added salt. Canned or smoked fish. Whole eggs or egg yolks. Chicken or [place] with skin. Dairy Whole or 2% milk, cream, and half-and-half. Whole or full-fat cream cheese. Whole-fat or sweetened yogurt. Full-fat cheese. Nondairy creamers. Whipped toppings. Processed cheese and cheese spreads. Fats and oils Butter. Stick margarine. Lard. Shortening. Ghee. Bacon fat. Tropical oils, such as coconut, palm kernel, or palm oil. Seasoning and other foods Salted popcorn and pretzels. Onion salt, garlic salt, seasoned salt, table salt, and sea salt. Worcestershire sauce. Tartar sauce. Barbecue sauce. Teriyaki sauce. Soy sauce, including reduced-sodium. Steak sauce. Canned and packaged gravies. Fish sauce. Oyster sauce. Cocktail sauce. Horseradish that you find on the shelf. Ketchup. Mustard. Meat flavorings and tenderizers. Bouillon cubes. Hot sauce and Tabasco sauce. Premade or packaged marinades. Premade or packaged taco seasonings. Relishes. Regular salad dressings. Where to find more information:  National Heart, Lung, and [place]: https://wilson-eaton.com/  American Heart Association: www.heart.org Summary  The DASH eating plan is a healthy eating plan that has been shown to reduce high blood pressure (hypertension). It may also reduce your risk for type 2 diabetes, heart disease, and stroke.  With the DASH eating plan, you should limit salt (sodium) intake to 2,300 mg a day. If you have hypertension, you may need to  reduce your sodium intake to 1,500 mg a day.  When on the DASH eating plan, aim to eat more fresh fruits and vegetables, whole grains, lean proteins, low-fat dairy, and heart-healthy fats.  Work with your health care provider or diet and nutrition specialist (dietitian) to adjust your eating plan to your individual calorie needs. This information is not intended to replace advice given to you by your health care provider. Make sure you discuss any questions you have with your  health care provider. Document Released: 01/04/2011 Document Revised: 01/09/2016 Document Reviewed: 01/09/2016 Elsevier Interactive Patient Education  2017 [employer].

## 2016-09-10 NOTE — Progress Notes (Signed)
   S:    Patient arrives in good spirits. Presents to the clinic for hypertension evaluation.    Patient reports adherence with medications. She has taken both of her blood pressure medications in the last 24 hours.   She reports that she has always had an issue with her blood pressure. She reports stress due to her job.   Current BP Medications include:  Amlodipine 10 mg daily, lisinopril 20 mg daily  Antihypertensives tried in the past include: chlorthalidone (this worked very well for her in the past per her report)  Dietary habits include: avoids salt  Patient denies headache, chest pain, SOB, visual changes, N/V/D, urinary retention.    O:   Last 3 Office BP readings: BP Readings from Last 3 Encounters:  09/10/16 (!) 171/117  08/22/16 (!) 149/89  11/08/15 (!) 182/111    BMET    Component Value Date/Time   NA 141 08/22/2016 1001   K 3.5 08/22/2016 1001   CL 103 08/22/2016 1001   CO2 25 08/22/2016 1001   GLUCOSE 98 08/22/2016 1001   GLUCOSE 95 06/27/2007 1526   BUN 11 08/22/2016 1001   CREATININE 0.78 08/22/2016 1001   CALCIUM 9.0 08/22/2016 1001   GFRNONAA 93 08/22/2016 1001   GFRAA 108 08/22/2016 1001    A/P: Hypertension longstanding currently uncontrolled on current medications. This was intended to be a nurse visit so no referral was placed for CPP management. Consulted Dr. Doreene Burke and will increase lisinopril to 40 mg daily and start HCTZ 25 mg daily. No s/sx of HTN emergency but need to lower blood pressure significantly to avoid complications or increased risk of MI/stroke. Patient counseled on side effects, included increase urination, dizziness, and to stand up slowly to avoid falling. Patient with pulse in the 60s and needs to drive so will not give clonidine in office but patient will go pick up meds immediately from [facility] and take new doses. Patient will contact me if she has home BP >180/100 or <90/60. Otherwise, she will return for BP check in 1 week. Patient  verbalized understanding.   Results reviewed and written information provided.   Total time in face-to-face counseling 20 minutes.   F/U Clinic Visit with me in 1 week..  Patient seen with Waverly Ferrari, PharmD Candidate

## 2017-01-14 ENCOUNTER — Other Ambulatory Visit: Payer: Self-pay | Admitting: Pharmacist

## 2017-01-14 DIAGNOSIS — I10 Essential (primary) hypertension: Secondary | ICD-10-CM

## 2017-01-14 MED ORDER — AMLODIPINE BESYLATE 10 MG PO TABS: 10.0000 mg | ORAL_TABLET | Freq: Every day | ORAL | 0 refills | Status: DC

## 2017-01-14 MED ORDER — LISINOPRIL 40 MG PO TABS: 40.0000 mg | ORAL_TABLET | Freq: Every day | ORAL | 0 refills | Status: DC

## 2017-03-08 ENCOUNTER — Other Ambulatory Visit: Payer: Self-pay | Admitting: Family Medicine

## 2017-03-08 DIAGNOSIS — I10 Essential (primary) hypertension: Secondary | ICD-10-CM

## 2017-04-15 ENCOUNTER — Other Ambulatory Visit: Payer: Self-pay | Admitting: Pharmacist

## 2017-04-15 DIAGNOSIS — I10 Essential (primary) hypertension: Secondary | ICD-10-CM

## 2017-04-15 MED ORDER — AMLODIPINE BESYLATE 10 MG PO TABS: 10.0000 mg | ORAL_TABLET | Freq: Every day | ORAL | 0 refills | Status: DC

## 2017-04-24 ENCOUNTER — Encounter: Payer: Self-pay | Admitting: Nurse Practitioner

## 2017-04-24 ENCOUNTER — Ambulatory Visit: Payer: BLUE CROSS/BLUE SHIELD | Attending: Nurse Practitioner | Admitting: Nurse Practitioner

## 2017-04-24 VITALS — BP 162/101 | HR 98 | Temp 98.5°F | Resp 16 | Ht 64.0 in | Wt 226.0 lb

## 2017-04-24 DIAGNOSIS — Z79899 Other long term (current) drug therapy: Secondary | ICD-10-CM | POA: Diagnosis not present

## 2017-04-24 DIAGNOSIS — Z7951 Long term (current) use of inhaled steroids: Secondary | ICD-10-CM | POA: Diagnosis not present

## 2017-04-24 DIAGNOSIS — Z841 Family history of disorders of kidney and ureter: Secondary | ICD-10-CM | POA: Diagnosis not present

## 2017-04-24 DIAGNOSIS — I10 Essential (primary) hypertension: Secondary | ICD-10-CM | POA: Insufficient documentation

## 2017-04-24 DIAGNOSIS — R350 Frequency of micturition: Secondary | ICD-10-CM | POA: Diagnosis not present

## 2017-04-24 DIAGNOSIS — F17210 Nicotine dependence, cigarettes, uncomplicated: Secondary | ICD-10-CM | POA: Insufficient documentation

## 2017-04-24 DIAGNOSIS — D860 Sarcoidosis of lung: Secondary | ICD-10-CM | POA: Diagnosis not present

## 2017-04-24 DIAGNOSIS — Z23 Encounter for immunization: Secondary | ICD-10-CM

## 2017-04-24 DIAGNOSIS — Z760 Encounter for issue of repeat prescription: Secondary | ICD-10-CM | POA: Insufficient documentation

## 2017-04-24 DIAGNOSIS — D869 Sarcoidosis, unspecified: Secondary | ICD-10-CM | POA: Diagnosis not present

## 2017-04-24 DIAGNOSIS — D573 Sickle-cell trait: Secondary | ICD-10-CM | POA: Diagnosis not present

## 2017-04-24 DIAGNOSIS — F17200 Nicotine dependence, unspecified, uncomplicated: Secondary | ICD-10-CM

## 2017-04-24 DIAGNOSIS — Z7952 Long term (current) use of systemic steroids: Secondary | ICD-10-CM | POA: Insufficient documentation

## 2017-04-24 DIAGNOSIS — Z9851 Tubal ligation status: Secondary | ICD-10-CM | POA: Insufficient documentation

## 2017-04-24 DIAGNOSIS — J45909 Unspecified asthma, uncomplicated: Secondary | ICD-10-CM | POA: Insufficient documentation

## 2017-04-24 DIAGNOSIS — Z8249 Family history of ischemic heart disease and other diseases of the circulatory system: Secondary | ICD-10-CM | POA: Insufficient documentation

## 2017-04-24 DIAGNOSIS — R399 Unspecified symptoms and signs involving the genitourinary system: Secondary | ICD-10-CM | POA: Diagnosis not present

## 2017-04-24 LAB — POCT URINALYSIS DIPSTICK
Bilirubin, UA: NEGATIVE
KETONES UA: NEGATIVE
Nitrite, UA: NEGATIVE
Protein, UA: NEGATIVE
Spec Grav, UA: 1.015 (ref 1.010–1.025)
UROBILINOGEN UA: 0.2 U/dL
pH, UA: 7.5 (ref 5.0–8.0)

## 2017-04-24 MED ORDER — HYDROCHLOROTHIAZIDE 25 MG PO TABS: 25.0000 mg | ORAL_TABLET | Freq: Every day | ORAL | 2 refills | Status: DC

## 2017-04-24 MED ORDER — TETANUS-DIPHTH-ACELL PERTUSSIS 5-2.5-18.5 LF-MCG/0.5 IM SUSP: 0.5000 mL | Freq: Once | INTRAMUSCULAR | Status: DC

## 2017-04-24 MED ORDER — ALBUTEROL SULFATE HFA 108 (90 BASE) MCG/ACT IN AERS: 1.0000 | INHALATION_SPRAY | Freq: Four times a day (QID) | RESPIRATORY_TRACT | 0 refills | Status: DC | PRN

## 2017-04-24 MED ORDER — ALBUTEROL SULFATE (2.5 MG/3ML) 0.083% IN NEBU: 2.5000 mg | INHALATION_SOLUTION | Freq: Four times a day (QID) | RESPIRATORY_TRACT | 1 refills | Status: DC | PRN

## 2017-04-24 MED ORDER — BUDESONIDE-FORMOTEROL FUMARATE 160-4.5 MCG/ACT IN AERO: 2.0000 | INHALATION_SPRAY | Freq: Two times a day (BID) | RESPIRATORY_TRACT | 3 refills | Status: DC

## 2017-04-24 MED ORDER — NITROFURANTOIN MONOHYD MACRO 100 MG PO CAPS: 100.0000 mg | ORAL_CAPSULE | Freq: Two times a day (BID) | ORAL | 0 refills | Status: AC

## 2017-04-24 MED FILL — SYMBICORT 160-4.5 MCG INH: 160-4.5 | 30 days supply | Qty: 10 | Fill #0

## 2017-04-24 MED FILL — HYDROCHLOROTHIAZIDE 25 MG T: 25 | 30 days supply | Qty: 30 | Fill #0

## 2017-04-24 MED FILL — NITROFURANTOIN MONO-MCR 100: 100 | 5 days supply | Qty: 10 | Fill #0

## 2017-04-24 MED FILL — ALBUTEROL SULFATE HFA 108 (: 108 (90 BAS | 25 days supply | Qty: 18 | Fill #0

## 2017-04-24 MED FILL — ALBUTEROL SUL 2.5 MG/3 ML S: (2.5 MG/3ML | 12 days supply | Qty: 150 | Fill #0

## 2017-04-24 NOTE — Progress Notes (Signed)
Assessment & Plan:  Teresa Mendez was seen today for establish care, urinary frequency and medication refill.  Diagnoses and all orders for this visit:  Essential hypertension -     hydrochlorothiazide (HYDRODIURIL) 25 MG tablet; Take 1 tablet (25 mg total) by mouth daily. Continue all antihypertensives as prescribed.  Remember to bring in your blood pressure log with you for your follow up appointment.  DASH/Mediterranean Diets are healthier choices for HTN.    PULMONARY SARCOIDOSIS -     budesonide-formoterol (SYMBICORT) 160-4.5 MCG/ACT inhaler; Inhale 2 puffs into the lungs 2 (two) times daily.  Tobacco dependence Teresa Mendez was counseled on the dangers of tobacco use, and was advised to quit. Reviewed strategies to maximize success, including removing cigarettes and smoking materials from environment, stress management and support of family/friends as well as pharmacological alternatives including: Wellbutrin, Chantix, Nicotine patch, Nicotine gum or lozenges. Smoking cessation support: smoking cessation hotline: 1-800-QUIT-NOW.  Smoking cessation classes are also available through [facility]. Call [phone] or visit our website at [email or web address]   A total of 5 minutes was spent on counseling for smoking cessation and Teresa Mendez is not ready to quit.    UTI symptoms -     Urinalysis Dipstick -     CULTURE, URINE COMPREHENSIVE -     nitrofurantoin, macrocrystal-monohydrate, (MACROBID) 100 MG capsule; Take 1 capsule (100 mg total) by mouth 2 (two) times daily for 5 days.  Intrinsic asthma -     budesonide-formoterol (SYMBICORT) 160-4.5 MCG/ACT inhaler; Inhale 2 puffs into the lungs 2 (two) times daily. -     albuterol (PROVENTIL HFA;VENTOLIN HFA) 108 (90 Base) MCG/ACT inhaler; Inhale 1-2 puffs into the lungs every 6 (six) hours as needed for wheezing or shortness of breath (cough). -     albuterol (PROVENTIL) (2.5 MG/3ML) 0.083% nebulizer solution; Take 3 mLs  (2.5 mg total) by nebulization every 6 (six) hours as needed for wheezing or shortness of breath. Asthma RED FLAGS/ACTION PLAN discussed REFERRED TO PULMONOLOGY  Immunization due -     Discontinue: Tdap (BOOSTRIX) injection 0.5 mL   Patient has been counseled on age-appropriate routine health concerns for screening and prevention. These are reviewed and up-to-date. Referrals have been placed accordingly. Immunizations are up-to-date or declined.    Subjective:   Chief Complaint  Patient presents with  . Establish Care    Pt. is here to establish care for hypertension and ashtma.   . Urinary Frequency    Pt. stated shes been using the bathroom alot lately with no pain and would like to check her kidney.   . Medication Refill   HPI Teresa Mendez 42 y.o. female presents to office today to establish care. She has concerns of possible UTI symptoms today. PMH significant for HTN, Pulmonary sarcoidosis, and asthma.  Essential Hypertension Chronic. Poorly controlled. She checks her blood pressure at work: 160-170/110s. Endorses medication compliance.  Currently taking lisinopril 40 mg daily as well as amlodipine 10 mg daily.  Will add hydrochlorothiazide 25 mg today. Denies chest pain,  palpitations, lightheadedness, dizziness, headaches or BLE edema. IF blood pressure still poorly controlled on HCTZ will screen for OSA. BP Readings from Last 3 Encounters:  04/24/17 (!) 162/101  09/10/16 (!) 171/117  08/22/16 (!) 149/89    Tobacco Dependence Tried E cigarettes but went back to smoking cigarettes. She is not ready to quit. She is aware this increases her risk of worsening lung disease.    Asthma Not well controlled. She is  noticeably short of breath today but with no accessory muscle use here in office. She does stop often mid sentence to catch her breath.  Excessively using her albuterol inhaler. Will add Symbicort today. She needs a pulmonology referral.  UTI Symptoms Frequency. Denies  dysuria, flank pain, hematuria or any vaginal symptoms. Onset a few weeks ago. She has a history of cystitis with hematuria.     Review of Systems  Constitutional: Negative for fever, malaise/fatigue and weight loss.  HENT: Negative.  Negative for nosebleeds.   Eyes: Negative.  Negative for blurred vision, double vision and photophobia.  Respiratory: Positive for shortness of breath and wheezing. Negative for cough, hemoptysis and sputum production.   Cardiovascular: Negative.  Negative for chest pain, palpitations and leg swelling.  Gastrointestinal: Negative.  Negative for heartburn, nausea and vomiting.  Genitourinary: Positive for frequency and urgency. Negative for dysuria, flank pain and hematuria.  Musculoskeletal: Negative.  Negative for myalgias.  Neurological: Negative.  Negative for dizziness, focal weakness, seizures and headaches.  Psychiatric/Behavioral: Negative.  Negative for suicidal ideas.    Past Medical History:  Diagnosis Date  . Asthma   . Hypertension   . Sarcoidosis   . Sickle cell trait [facility])     Past Surgical History:  Procedure Laterality Date  . TUBAL LIGATION      Family History  Problem Relation Age of Onset  . Hypertension Mother   . Congestive Heart Failure Mother   . Kidney failure Mother   . Sickle cell trait Mother   . Hypertension Father   . Congestive Heart Failure Father   . Kidney failure Father   . Glaucoma Father     Social History Reviewed with no changes to be made today.   Outpatient Medications Prior to Visit  Medication Sig Dispense Refill  . acetaminophen (TYLENOL) 500 MG tablet Take 2 tablets (1,000 mg total) by mouth every 8 (eight) hours as needed for moderate pain. 30 tablet 0  . amLODipine (NORVASC) 10 MG tablet Take 1 tablet (10 mg total) by mouth daily. 30 tablet 0  . lisinopril (PRINIVIL,ZESTRIL) 40 MG tablet Take 1 tablet (40 mg total) by mouth daily. 30 tablet 0  . albuterol (PROVENTIL HFA;VENTOLIN HFA) 108 (90  Base) MCG/ACT inhaler Inhale 1-2 puffs into the lungs every 6 (six) hours as needed for wheezing or shortness of breath. 1 Inhaler 3  . ALBUTEROL IN Inhale into the lungs.    . hydrochlorothiazide (HYDRODIURIL) 25 MG tablet Take 1 tablet (25 mg total) by mouth daily. 30 tablet 2  . predniSONE (DELTASONE) 20 MG tablet 3 tabs po day one, then 2 po daily x 4 days 11 tablet 0  . nitrofurantoin (MACRODANTIN) 100 MG capsule Take 1 capsule (100 mg total) by mouth 2 (two) times daily. For 7 days. 14 capsule 0   No facility-administered medications prior to visit.     No Known Allergies     Objective:    BP (!) 162/101 (BP Location: Left Arm, Patient Position: Sitting, Cuff Size: Normal)   Pulse 98   Temp 98.5 F (36.9 C) (Oral)   Ht 5\' 4"  (1.626 m)   Wt 226 lb (102.5 kg)   LMP 03/29/2017   SpO2 94%   BMI 38.79 kg/m  Wt Readings from Last 3 Encounters:  04/24/17 226 lb (102.5 kg)  08/22/16 216 lb 3.2 oz (98.1 kg)  10/14/07 206 lb (93.4 kg)    Physical Exam  Constitutional: She is oriented to person, place, and  time. She appears well-developed and well-nourished. She is cooperative.  HENT:  Head: Normocephalic and atraumatic.  Eyes: EOM are normal.  Neck: Normal range of motion.  Cardiovascular: Normal rate, regular rhythm and normal heart sounds. Exam reveals no gallop and no friction rub.  No murmur heard. Pulmonary/Chest: Effort normal. No accessory muscle usage. Tachypnea noted. No respiratory distress. She has decreased breath sounds in the right upper field, the right middle field, the right lower field, the left upper field, the left middle field and the left lower field. She has no wheezes. She has no rhonchi. She has no rales. She exhibits no tenderness.  Abdominal: Soft. Bowel sounds are normal. There is no tenderness. There is no CVA tenderness.  Musculoskeletal: Normal range of motion. She exhibits no edema.  Neurological: She is alert and oriented to person, place, and  time. Coordination normal.  Skin: Skin is warm and dry.  Psychiatric: She has a normal mood and affect. Her behavior is normal. Judgment and thought content normal.  Nursing note and vitals reviewed.     Patient has been counseled extensively about nutrition and exercise as well as the importance of adherence with medications and regular follow-up. The patient was given clear instructions to go to ER or return to medical center if symptoms don't improve, worsen or new problems develop. The patient verbalized understanding.   Follow-up: Return for see comment.   [name], FNP-BC [facility] [place], [place]   04/26/2017, 5:55 PM

## 2017-04-24 NOTE — Patient Instructions (Signed)
Sarcoidosis Sarcoidosis is a disease that causes inflammation in your organs and other areas of your body. The lungs are most often affected (pulmonary sarcoidosis). Sarcoidosis can also affect your lymph nodes, liver, eyes, skin, or any other body tissue. When you have sarcoidosis, small clumps of tissue (granulomas) form in the affected area of your body. Granulomas are made up of your body's defense (immune) cells. Inflammation results when your body reacts to a harmful substance. Normally, inflammation goes away after immune cells get rid of the harmful substance. In sarcoidosis, the immune cells form granulomas instead. What are the causes? The exact cause of sarcoidosis is not known. Something triggers the immune system to respond, such as dust, chemicals, bacteria, or a virus. What increases the risk? You may be at a greater risk for sarcoidosis if you:  Have a family history of the disease.  Are African American.  Are of Northern European ancestry.  Are 26-22 years old.  Are female.  What are the signs or symptoms? Many people with sarcoidosis have no symptoms. Others have very mild symptoms. Sarcoidosis most often affects the lungs. Symptoms include:  Chest pain.  Coughing.  Wheezing.  Shortness of breath.  Other common symptoms include:  Night sweats.  Weight loss.  Fatigue.  Depression.  A sense of uneasiness.  How is this diagnosed? Sarcoidosis may be diagnosed by:  Medical history and physical exam.  Chest X-ray. This looks for granulomas in your lungs.  Lung function tests. These measure your breathing and look for problems related to sarcoidosis.  Examining a sample of tissue under a microscope (biopsy).  How is this treated? Sarcoidosis usually clears up without treatment. You may take medicines to reduce inflammation or relieve symptoms. These may include:  Prednisone. This steroid reduces inflammation related to sarcoidosis.  Chloroquine or  hydroxychloroquine. These are antimalarial medicines used to treat sarcoidosis that affects the skin or brain.  Methotrexate, leflunomide, or azathioprine. These medicines affect the immune system and can help with sarcoidosis in the joints, eyes, skin, or lungs.  Inhalers. Inhaled medicines can help you breathe if sarcoidosis is affecting your lungs.  Follow these instructions at home:  Do not use any tobacco products, including cigarettes, chewing tobacco, or electronic cigarettes. If you need help quitting, ask your health care provider.  Avoid secondhand smoke.  Avoid irritating dust and chemicals. Stay indoors on days when air quality is poor in your area.  Take medicines only as directed by your health care provider. Contact a health care provider if:  You have vision problems.  You have shortness of breath.  You have a dry, persistent cough.  You have an irregular heartbeat.  You have pain or ache in your joints, hands, or feet.  You have an unexplained rash. Get help right away if: You have chest pain. This information is not intended to replace advice given to you by your health care provider. Make sure you discuss any questions you have with your health care provider. Document Released: 11/16/2003 Document Revised: 06/23/2015 Document Reviewed: 05/13/2013 Elsevier Interactive Patient Education  2018 [employer] Inc.  Chronic Obstructive Pulmonary Disease Chronic obstructive pulmonary disease (COPD) is a long-term (chronic) lung problem. When you have COPD, it is hard for air to get in and out of your lungs. The way your lungs work will never return to normal. Usually the condition gets worse over time. There are things you can do to keep yourself as healthy as possible. Your doctor may treat your condition with:  Medicines.  Quitting smoking, if you smoke.  Rehabilitation. This may involve a team of specialists.  Oxygen.  Exercise and changes to your diet.  Lung  surgery.  Comfort measures (palliative care).  Follow these instructions at home: Medicines  Take over-the-counter and prescription medicines only as told by your doctor.  Talk to your doctor before taking any cough or allergy medicines. You may need to avoid medicines that cause your lungs to be dry. Lifestyle  If you smoke, stop. Smoking makes the problem worse. If you need help quitting, ask your doctor.  Avoid being around things that make your breathing worse. This may include smoke, chemicals, and fumes.  Stay active, but remember to also rest.  Learn and use tips on how to relax.  Make sure you get enough sleep. Most adults need at least 7 hours a night.  Eat healthy foods. Eat smaller meals more often. Rest before meals. Controlled breathing  Learn and use tips on how to control your breathing as told by your doctor. Try: ? Breathing in (inhaling) through your nose for 1 second. Then, pucker your lips and breath out (exhale) through your lips for 2 seconds. ? Putting one hand on your belly (abdomen). Breathe in slowly through your nose for 1 second. Your hand on your belly should move out. Pucker your lips and breathe out slowly through your lips. Your hand on your belly should move in as you breathe out. Controlled coughing  Learn and use controlled coughing to clear mucus from your lungs. The steps are: 1. Lean your head a little forward. 2. Breathe in deeply. 3. Try to hold your breath for 3 seconds. 4. Keep your mouth slightly open while coughing 2 times. 5. Spit any mucus out into a tissue. 6. Rest and do the steps again 1 or 2 times as needed. General instructions  Make sure you get all the shots (vaccines) that your doctor recommends. Ask your doctor about a flu shot and a pneumonia shot.  Use oxygen therapy and therapy to help improve your lungs (pulmonary rehabilitation) if told by your doctor. If you need home oxygen therapy, ask your doctor if you should  buy a tool to measure your oxygen level (oximeter).  Make a COPD action plan with your doctor. This helps you know what to do if you feel worse than usual.  Manage any other conditions you have as told by your doctor.  Avoid going outside when it is very hot, cold, or humid.  Avoid people who have a sickness you can catch (contagious).  Keep all follow-up visits as told by your doctor. This is important. Contact a doctor if:  You cough up more mucus than usual.  There is a change in the color or thickness of the mucus.  It is harder to breathe than usual.  Your breathing is faster than usual.  You have trouble sleeping.  You need to use your medicines more often than usual.  You have trouble doing your normal activities such as getting dressed or walking around the house. Get help right away if:  You have shortness of breath while resting.  You have shortness of breath that stops you from: ? Being able to talk. ? Doing normal activities.  Your chest hurts for longer than 5 minutes.  Your skin color is more blue than usual.  Your pulse oximeter shows that you have low oxygen for longer than 5 minutes.  You have a fever.  You feel too  tired to breathe normally. Summary  Chronic obstructive pulmonary disease (COPD) is a long-term lung problem.  The way your lungs work will never return to normal. Usually the condition gets worse over time. There are things you can do to keep yourself as healthy as possible.  Take over-the-counter and prescription medicines only as told by your doctor.  If you smoke, stop. Smoking makes the problem worse. This information is not intended to replace advice given to you by your health care provider. Make sure you discuss any questions you have with your health care provider. Document Released: 07/04/2007 Document Revised: 06/23/2015 Document Reviewed: 09/11/2012 Elsevier Interactive Patient Education  2017 [employer].

## 2017-04-26 ENCOUNTER — Encounter: Payer: Self-pay | Admitting: Nurse Practitioner

## 2017-04-27 LAB — CULTURE, URINE COMPREHENSIVE

## 2017-04-29 ENCOUNTER — Telehealth: Payer: Self-pay

## 2017-04-29 NOTE — Telephone Encounter (Signed)
-----   Message from [name], NP sent at 04/28/2017  1:50 PM EDT ----- Urine culture did not show a UTI

## 2017-04-29 NOTE — Telephone Encounter (Signed)
CMA called patient to inform on lab results.  Patient understood. Verified DOB.

## 2017-05-16 ENCOUNTER — Ambulatory Visit: Payer: BLUE CROSS/BLUE SHIELD | Attending: Nurse Practitioner

## 2017-05-16 VITALS — BP 137/85 | HR 83 | Ht 64.0 in | Wt 229.4 lb

## 2017-05-16 DIAGNOSIS — Z0130 Encounter for examination of blood pressure without abnormal findings: Secondary | ICD-10-CM | POA: Insufficient documentation

## 2017-05-16 NOTE — Progress Notes (Signed)
Patient is here today for bp check. Patient BP today is 137/85.

## 2017-05-20 ENCOUNTER — Other Ambulatory Visit: Payer: Self-pay | Admitting: Internal Medicine

## 2017-05-20 DIAGNOSIS — I10 Essential (primary) hypertension: Secondary | ICD-10-CM

## 2017-05-22 ENCOUNTER — Ambulatory Visit: Payer: BLUE CROSS/BLUE SHIELD | Admitting: Critical Care Medicine

## 2017-08-26 ENCOUNTER — Telehealth: Payer: Self-pay | Admitting: Pharmacist

## 2017-08-26 DIAGNOSIS — I10 Essential (primary) hypertension: Secondary | ICD-10-CM

## 2017-08-26 MED ORDER — AMLODIPINE BESYLATE 10 MG PO TABS: 10.0000 mg | ORAL_TABLET | Freq: Every day | ORAL | 0 refills | Status: DC

## 2017-08-26 NOTE — Telephone Encounter (Signed)
Received refill request for amlodipine 10 mg daily from [facility] on [address]. Patient was last seen by Zelda on 04/24/17. Will send in for 30 day supply and inform patient of need to make an office visit for more refills.

## 2017-10-09 ENCOUNTER — Ambulatory Visit: Payer: Medicaid Other | Admitting: Nurse Practitioner

## 2017-12-03 ENCOUNTER — Other Ambulatory Visit: Payer: Self-pay | Admitting: Nurse Practitioner

## 2017-12-03 DIAGNOSIS — J45909 Unspecified asthma, uncomplicated: Secondary | ICD-10-CM

## 2017-12-03 MED FILL — ALBUTEROL SUL 2.5 MG/3 ML S: (2.5 MG/3ML | 12 days supply | Qty: 150 | Fill #1

## 2017-12-03 MED FILL — SYMBICORT 160-4.5 MCG INH: 160-4.5 | 30 days supply | Qty: 10 | Fill #1

## 2017-12-03 MED FILL — HYDROCHLOROTHIAZIDE 25 MG T: 25 | 30 days supply | Qty: 30 | Fill #1

## 2017-12-25 ENCOUNTER — Other Ambulatory Visit: Payer: Self-pay | Admitting: Family Medicine

## 2017-12-25 DIAGNOSIS — I10 Essential (primary) hypertension: Secondary | ICD-10-CM

## 2018-01-23 ENCOUNTER — Other Ambulatory Visit: Payer: Self-pay

## 2018-01-23 ENCOUNTER — Other Ambulatory Visit: Payer: Self-pay | Admitting: Nurse Practitioner

## 2018-01-23 DIAGNOSIS — J45909 Unspecified asthma, uncomplicated: Secondary | ICD-10-CM

## 2018-01-23 MED ORDER — ALBUTEROL SULFATE (2.5 MG/3ML) 0.083% IN NEBU
2.5000 mg | INHALATION_SOLUTION | Freq: Four times a day (QID) | RESPIRATORY_TRACT | 0 refills | Status: DC | PRN
Start: 2018-01-23 — End: 2018-02-07

## 2018-01-23 MED FILL — ALBUTEROL SUL 2.5 MG/3 ML S: (2.5 MG/3ML | 12 days supply | Qty: 150 | Fill #0

## 2018-01-24 ENCOUNTER — Other Ambulatory Visit: Payer: Self-pay | Admitting: Nurse Practitioner

## 2018-01-24 DIAGNOSIS — J45909 Unspecified asthma, uncomplicated: Secondary | ICD-10-CM

## 2018-02-07 ENCOUNTER — Encounter: Payer: Self-pay | Admitting: Nurse Practitioner

## 2018-02-07 ENCOUNTER — Ambulatory Visit: Payer: Medicaid Other | Attending: Nurse Practitioner | Admitting: Nurse Practitioner

## 2018-02-07 VITALS — BP 154/92 | HR 91 | Temp 98.9°F | Ht 64.0 in | Wt 232.2 lb

## 2018-02-07 DIAGNOSIS — D573 Sickle-cell trait: Secondary | ICD-10-CM | POA: Insufficient documentation

## 2018-02-07 DIAGNOSIS — S46911A Strain of unspecified muscle, fascia and tendon at shoulder and upper arm level, right arm, initial encounter: Secondary | ICD-10-CM | POA: Diagnosis not present

## 2018-02-07 DIAGNOSIS — I10 Essential (primary) hypertension: Secondary | ICD-10-CM | POA: Diagnosis not present

## 2018-02-07 DIAGNOSIS — Z8249 Family history of ischemic heart disease and other diseases of the circulatory system: Secondary | ICD-10-CM | POA: Diagnosis not present

## 2018-02-07 DIAGNOSIS — M79603 Pain in arm, unspecified: Secondary | ICD-10-CM | POA: Diagnosis present

## 2018-02-07 DIAGNOSIS — D869 Sarcoidosis, unspecified: Secondary | ICD-10-CM | POA: Diagnosis not present

## 2018-02-07 DIAGNOSIS — J45909 Unspecified asthma, uncomplicated: Secondary | ICD-10-CM

## 2018-02-07 DIAGNOSIS — Z79899 Other long term (current) drug therapy: Secondary | ICD-10-CM | POA: Diagnosis not present

## 2018-02-07 DIAGNOSIS — Z7901 Long term (current) use of anticoagulants: Secondary | ICD-10-CM | POA: Diagnosis not present

## 2018-02-07 DIAGNOSIS — M79601 Pain in right arm: Secondary | ICD-10-CM | POA: Diagnosis not present

## 2018-02-07 MED ORDER — HYDROCHLOROTHIAZIDE 25 MG PO TABS: 25.0000 mg | ORAL_TABLET | Freq: Every day | ORAL | 2 refills | Status: DC

## 2018-02-07 MED ORDER — ALBUTEROL SULFATE (2.5 MG/3ML) 0.083% IN NEBU: 2.5000 mg | INHALATION_SOLUTION | Freq: Four times a day (QID) | RESPIRATORY_TRACT | 0 refills | Status: DC | PRN

## 2018-02-07 MED ORDER — AMLODIPINE BESYLATE 10 MG PO TABS: 10.0000 mg | ORAL_TABLET | Freq: Every day | ORAL | 0 refills | Status: DC

## 2018-02-07 MED ORDER — CYCLOBENZAPRINE HCL 10 MG PO TABS: 10.0000 mg | ORAL_TABLET | Freq: Three times a day (TID) | ORAL | 1 refills | Status: DC | PRN

## 2018-02-07 MED ORDER — IBUPROFEN 600 MG PO TABS: 600.0000 mg | ORAL_TABLET | Freq: Three times a day (TID) | ORAL | 1 refills | Status: DC | PRN

## 2018-02-07 MED ORDER — LISINOPRIL 40 MG PO TABS: 40.0000 mg | ORAL_TABLET | Freq: Every day | ORAL | 0 refills | Status: DC

## 2018-02-07 MED ORDER — ALBUTEROL SULFATE HFA 108 (90 BASE) MCG/ACT IN AERS: 1.0000 | INHALATION_SPRAY | Freq: Four times a day (QID) | RESPIRATORY_TRACT | 0 refills | Status: DC | PRN

## 2018-02-07 MED FILL — PROAIR HFA 90 MCG INHALER: 108 (90 BAS | 25 days supply | Qty: 9 | Fill #0

## 2018-02-07 MED FILL — LISINOPRIL 40 MG TABLET: 40 | 30 days supply | Qty: 30 | Fill #0

## 2018-02-07 MED FILL — ALBUTEROL SUL 2.5 MG/3 ML S: (2.5 MG/3ML | 12 days supply | Qty: 150 | Fill #0

## 2018-02-07 MED FILL — IBUPROFEN 600 MG TABLET: 600 | 20 days supply | Qty: 60 | Fill #0

## 2018-02-07 MED FILL — AMLODIPINE BESYLATE 10 MG T: 10 | 30 days supply | Qty: 30 | Fill #0

## 2018-02-07 MED FILL — HYDROCHLOROTHIAZIDE 25 MG T: 25 | 30 days supply | Qty: 30 | Fill #0

## 2018-02-07 MED FILL — CYCLOBENZAPRINE 10 MG TAB: 10 | 20 days supply | Qty: 60 | Fill #0

## 2018-02-07 NOTE — Progress Notes (Signed)
Assessment & Plan:  Teresa Mendez was seen today for arm pain.  Diagnoses and all orders for this visit:  Strain of right shoulder, initial encounter -     CMP14+EGFR -     CBC -     CBC -     cyclobenzaprine (FLEXERIL) 10 MG tablet; Take 1 tablet (10 mg total) by mouth 3 (three) times daily as needed for muscle spasms. -     ibuprofen (ADVIL,MOTRIN) 600 MG tablet; Take 1 tablet (600 mg total) by mouth every 8 (eight) hours as needed. May alternate with heat and acetaminophen as prescribed.   Intrinsic asthma -     albuterol (PROVENTIL HFA;VENTOLIN HFA) 108 (90 Base) MCG/ACT inhaler; Inhale 1-2 puffs into the lungs every 6 (six) hours as needed for wheezing or shortness of breath (cough). -     albuterol (PROVENTIL) (2.5 MG/3ML) 0.083% nebulizer solution; Take 3 mLs (2.5 mg total) by nebulization every 6 (six) hours as needed for wheezing or shortness of breath. MUST MAKE APPT FOR REFILLS   Essential hypertension -     hydrochlorothiazide (HYDRODIURIL) 25 MG tablet; Take 1 tablet (25 mg total) by mouth daily. -     lisinopril (PRINIVIL,ZESTRIL) 40 MG tablet; Take 1 tablet (40 mg total) by mouth daily. -     amLODipine (NORVASC) 10 MG tablet; Take 1 tablet (10 mg total) by mouth daily. Continue all antihypertensives as prescribed.  Remember to bring in your blood pressure log with you for your follow up appointment.  DASH/Mediterranean Diets are healthier choices for HTN.     Patient has been counseled on age-appropriate routine health concerns for screening and prevention. These are reviewed and up-to-date. Referrals have been placed accordingly. Immunizations are up-to-date or declined.    Subjective:   Chief Complaint  Patient presents with  . Arm Pain   HPI Teresa Mendez 43 y.o. female presents to office today with right shoulder and arm pain.  Arthralgia Father died last week. 2 days ago she started with stiffness in her right shoulder and right arm. She does sleep on her  right side but is not sure if she slept wrong or not on that side. She denies any numbness, hemiparesis or stroke symptoms today. She can raise her arm above her shoulder with no difficulty. She has tried tylenol, ibuprofen, hot compression with no relief of symptoms. Tightness is endorsed when she attempts to turn or bend her neck to the right.   ESSENTIAL HYPERTENSION Blood pressure is not well controlled today. She has not been medication compliant due to being out of her medications. She continues to smoke. Denies chest pain, shortness of breath, palpitations, lightheadedness, dizziness, headaches or BLE edema. Will refill medications and f/u for blood pressure recheck in 3-4 weeks. Current medications include: amlodipine 51m, lisinopril 454mand HCTZ 2547m BP Readings from Last 3 Encounters:  02/07/18 (!) 154/92  05/16/17 137/85  04/24/17 (!) 162/101    Review of Systems  Constitutional: Negative for fever, malaise/fatigue and weight loss.  HENT: Negative.  Negative for nosebleeds.   Eyes: Negative.  Negative for blurred vision, double vision and photophobia.  Respiratory: Negative.  Negative for cough and shortness of breath.   Cardiovascular: Negative.  Negative for chest pain, palpitations and leg swelling.  Gastrointestinal: Negative.  Negative for heartburn, nausea and vomiting.  Musculoskeletal: Positive for myalgias and neck pain. Negative for back pain, falls and joint pain.       SEE HPI  Neurological: Negative.  Negative for dizziness, tingling, tremors, sensory change, speech change, focal weakness, seizures, loss of consciousness, weakness and headaches.  Psychiatric/Behavioral: Negative.  Negative for suicidal ideas.    Past Medical History:  Diagnosis Date  . Asthma   . Hypertension   . Sarcoidosis   . Sickle cell trait [facility])     Past Surgical History:  Procedure Laterality Date  . TUBAL LIGATION      Family History  Problem Relation Age of Onset  .  Hypertension Mother   . Congestive Heart Failure Mother   . Kidney failure Mother   . Sickle cell trait Mother   . Hypertension Father   . Congestive Heart Failure Father   . Kidney failure Father   . Glaucoma Father     Social History Reviewed with no changes to be made today.   Outpatient Medications Prior to Visit  Medication Sig Dispense Refill  . acetaminophen (TYLENOL) 500 MG tablet Take 2 tablets (1,000 mg total) by mouth every 8 (eight) hours as needed for moderate pain. 30 tablet 0  . budesonide-formoterol (SYMBICORT) 160-4.5 MCG/ACT inhaler Inhale 2 puffs into the lungs 2 (two) times daily. 1 Inhaler 3  . albuterol (PROVENTIL HFA;VENTOLIN HFA) 108 (90 Base) MCG/ACT inhaler Inhale 1-2 puffs into the lungs every 6 (six) hours as needed for wheezing or shortness of breath (cough). MUST MAKE APPT FOR FURTHER REFILLS 18 g 0  . albuterol (PROVENTIL) (2.5 MG/3ML) 0.083% nebulizer solution Take 3 mLs (2.5 mg total) by nebulization every 6 (six) hours as needed for wheezing or shortness of breath. MUST MAKE APPT FOR REFILLS 150 mL 0  . amLODipine (NORVASC) 10 MG tablet Take 1 tablet (10 mg total) by mouth daily. 30 tablet 0  . hydrochlorothiazide (HYDRODIURIL) 25 MG tablet Take 1 tablet (25 mg total) by mouth daily. 30 tablet 2  . lisinopril (PRINIVIL,ZESTRIL) 40 MG tablet Take 1 tablet (40 mg total) by mouth daily. 30 tablet 0   No facility-administered medications prior to visit.     No Known Allergies     Objective:    BP (!) 154/92   Pulse 91   Temp 98.9 F (37.2 C) (Oral)   Ht _0  (1.626 m)   Wt 232 lb 3.2 oz (105.3 kg)   SpO2 95%   BMI 39.86 kg/m  Wt Readings from Last 3 Encounters:  02/07/18 232 lb 3.2 oz (105.3 kg)  05/16/17 229 lb 6.4 oz (104.1 kg)  04/24/17 226 lb (102.5 kg)    Physical Exam Vitals signs and nursing note reviewed.  Constitutional:      Appearance: She is well-developed.  HENT:     Head: Normocephalic and atraumatic.  Neck:      Musculoskeletal: Pain with movement and muscular tenderness present. No edema, neck rigidity, torticollis or spinous process tenderness.  Cardiovascular:     Rate and Rhythm: Normal rate and regular rhythm.     Heart sounds: Normal heart sounds. No murmur. No friction rub. No gallop.   Pulmonary:     Effort: Pulmonary effort is normal. No tachypnea or respiratory distress.     Breath sounds: Normal breath sounds. No decreased breath sounds, wheezing, rhonchi or rales.  Chest:     Chest wall: No tenderness.  Abdominal:     General: Bowel sounds are normal.     Palpations: Abdomen is soft.  Musculoskeletal: Normal range of motion.     Right shoulder: She exhibits tenderness. She exhibits normal range of motion, no bony tenderness,  no swelling and no deformity.     Right elbow: She exhibits normal range of motion and no swelling.  Skin:    General: Skin is warm and dry.  Neurological:     Mental Status: She is alert and oriented to person, place, and time.     Coordination: Coordination normal.  Psychiatric:        Behavior: Behavior normal. Behavior is cooperative.        Thought Content: Thought content normal.        Judgment: Judgment normal.          Patient has been counseled extensively about nutrition and exercise as well as the importance of adherence with medications and regular follow-up. The patient was given clear instructions to go to ER or return to medical center if symptoms don't improve, worsen or new problems develop. The patient verbalized understanding.   Follow-up: Return in about 3 weeks (around 02/28/2018) for right shoulder pain.   [name], FNP-BC [facility] and [place], [place]   02/07/2018, 2:43 PM

## 2018-02-07 NOTE — Patient Instructions (Signed)
Muscle Strain A muscle strain is an injury that happens when a muscle is stretched longer than normal. This can happen during a fall, sports, or lifting. This can tear some muscle fibers. Usually, recovery from muscle strain takes 1-2 weeks. Complete healing normally takes 5-6 weeks. This condition is first treated with PRICE therapy. This involves:  Protecting your muscle from being injured again.  Resting your injured muscle.  Icing your injured muscle.  Applying pressure (compression) to your injured muscle. This may be done with a splint or elastic bandage.  Raising (elevating) your injured muscle. Your doctor may also recommend medicine for pain. Follow these instructions at home: If you have a splint:  Wear the splint as told by your doctor. Take it off only as told by your doctor.  Loosen the splint if your fingers or toes tingle, get numb, or turn cold and blue.  Keep the splint clean.  If the splint is not waterproof: ? Do not let it get wet. ? Cover it with a watertight covering when you take a bath or a shower. Managing pain, stiffness, and swelling   If directed, put ice on your injured area. ? If you have a removable splint, take it off as told by your doctor. ? Put ice in a plastic bag. ? Place a towel between your skin and the bag. ? Leave the ice on for 20 minutes, 2-3 times a day.  Move your fingers or toes often. This helps to avoid stiffness and lessen swelling.  Raise your injured area above the level of your heart while you are sitting or lying down.  Wear an elastic bandage as told by your doctor. Make sure it is not too tight. General instructions  Take over-the-counter and prescription medicines only as told by your doctor.  Limit your activity. Rest your injured muscle as told by your doctor. Your doctor may say that gentle movements are okay.  If physical therapy was prescribed, do exercises as told by your doctor.  Do not put pressure on any  part of the splint until it is fully hardened. This may take many hours.  Do not use any products that contain nicotine or tobacco, such as cigarettes and e-cigarettes. These can delay bone healing. If you need help quitting, ask your doctor.  Warm up before you exercise. This helps to prevent more muscle strains.  Ask your doctor when it is safe to drive if you have a splint.  Keep all follow-up visits as told by your doctor. This is important. Contact a doctor if:  You have more pain or swelling in your injured area. Get help right away if:  You have any of these problems in your injured area: ? You have numbness. ? You have tingling. ? You lose a lot of strength. Summary  A muscle strain is an injury that happens when a muscle is stretched longer than normal.  This condition is first treated with PRICE therapy. This includes protecting, resting, icing, adding pressure, and raising your injury.  Limit your activity. Rest your injured muscle as told by your doctor. Your doctor may say that gentle movements are okay.  Warm up before you exercise. This helps to prevent more muscle strains. This information is not intended to replace advice given to you by your health care provider. Make sure you discuss any questions you have with your health care provider. Document Released: 10/25/2007 Document Revised: 02/22/2016 Document Reviewed: 02/22/2016 Elsevier Interactive Patient Education  2019 Elsevier   Inc.  

## 2018-02-07 NOTE — Progress Notes (Signed)
Patient is having pain in right shoulder and arm.

## 2018-02-11 LAB — CMP14+EGFR
A/G RATIO: 1.1 — AB (ref 1.2–2.2)
ALBUMIN: 3.6 g/dL (ref 3.5–5.5)
ALT: 17 IU/L (ref 0–32)
AST: 16 IU/L (ref 0–40)
Alkaline Phosphatase: 77 IU/L (ref 39–117)
BUN/Creatinine Ratio: 17 (ref 9–23)
BUN: 15 mg/dL (ref 6–24)
CHLORIDE: 101 mmol/L (ref 96–106)
CO2: 20 mmol/L (ref 20–29)
Calcium: 9.1 mg/dL (ref 8.7–10.2)
Creatinine, Ser: 0.89 mg/dL (ref 0.57–1.00)
GFR calc non Af Amer: 79 mL/min/{1.73_m2} (ref >59)
GFR, EST AFRICAN AMERICAN: 91 mL/min/{1.73_m2} (ref >59)
GLOBULIN, TOTAL: 3.3 g/dL (ref 1.5–4.5)
GLUCOSE: 84 mg/dL (ref 65–99)
POTASSIUM: 3.5 mmol/L (ref 3.5–5.2)
Sodium: 143 mmol/L (ref 134–144)
TOTAL PROTEIN: 6.9 g/dL (ref 6.0–8.5)

## 2018-02-11 LAB — CBC
Hematocrit: 31.2 % — ABNORMAL LOW (ref 34.0–46.6)
Hemoglobin: 9.5 g/dL — ABNORMAL LOW (ref 11.1–15.9)
MCH: 21.4 pg — ABNORMAL LOW (ref 26.6–33.0)
MCHC: 30.4 g/dL — AB (ref 31.5–35.7)
MCV: 70 fL — ABNORMAL LOW (ref 79–97)
Platelets: 432 10*3/uL (ref 150–450)
RBC: 4.44 x10E6/uL (ref 3.77–5.28)
RDW: 16 % — ABNORMAL HIGH (ref 11.7–15.4)
WBC: 11.4 10*3/uL — ABNORMAL HIGH (ref 3.4–10.8)

## 2018-02-13 ENCOUNTER — Telehealth (INDEPENDENT_AMBULATORY_CARE_PROVIDER_SITE_OTHER): Payer: Self-pay

## 2018-02-13 NOTE — Telephone Encounter (Signed)
Patient verified DOB. She is aware that labs show anemia. Will recheck at next office visit and discuss with PCP. Nat Christen, CMA

## 2018-02-13 NOTE — Telephone Encounter (Signed)
-----   Message from [name], NP sent at 02/12/2018 11:22 PM EST ----- Labs show anemia. We will recheck at your next office visit and discuss

## 2018-03-03 ENCOUNTER — Ambulatory Visit: Payer: Medicaid Other | Admitting: Nurse Practitioner

## 2018-03-04 NOTE — Progress Notes (Unsigned)
Left message on voicemail  To return call.   Attempt to speak to patient regarding appointment on yesterday. Options to :  Reschedule  Call back for an appointment after he checks his availability  Come in for no show/ SAME DAY

## 2018-03-27 ENCOUNTER — Other Ambulatory Visit: Payer: Self-pay | Admitting: Nurse Practitioner

## 2018-03-27 DIAGNOSIS — I10 Essential (primary) hypertension: Secondary | ICD-10-CM

## 2018-04-17 MED FILL — ALBUTEROL SUL 2.5 MG/3 ML S: (2.5 MG/3ML | 12 days supply | Qty: 150 | Fill #0

## 2018-04-17 MED FILL — AMLODIPINE BESYLATE 10 MG T: 10 | 30 days supply | Qty: 30 | Fill #0

## 2018-04-17 MED FILL — LISINOPRIL 40 MG TABLET: 40 | 30 days supply | Qty: 30 | Fill #0

## 2018-05-21 ENCOUNTER — Telehealth: Payer: Self-pay | Admitting: Nurse Practitioner

## 2018-05-21 DIAGNOSIS — J45909 Unspecified asthma, uncomplicated: Secondary | ICD-10-CM

## 2018-05-21 MED ORDER — ALBUTEROL SULFATE HFA 108 (90 BASE) MCG/ACT IN AERS: 1.0000 | INHALATION_SPRAY | Freq: Four times a day (QID) | RESPIRATORY_TRACT | 2 refills | Status: DC | PRN

## 2018-05-21 MED FILL — CYCLOBENZAPRINE 10 MG TAB: 10 | 20 days supply | Qty: 60 | Fill #1

## 2018-05-21 MED FILL — IBUPROFEN 600 MG TABLET: 600 | 20 days supply | Qty: 60 | Fill #1

## 2018-05-21 NOTE — Telephone Encounter (Signed)
1) Medication(s) Requested (by name): albuterol (PROVENTIL HFA;VENTOLIN HFA) 108 (90 Base) MCG/ACT inhaler  acetaminophen (TYLENOL) 500 MG tablet cyclobenzaprine (FLEXERIL) 10 MG tablet   2) Pharmacy of Choice: [facility] [address] [place] 3) Special Requests:   Approved medications will be sent to the pharmacy, we will reach out if there is an issue.  Requests made after 3pm may not be addressed until the following business day!  If a patient is unsure of the name of the medication(s) please note and ask patient to call back when they are able to provide all info, do not send to responsible party until all information is available!

## 2018-05-28 ENCOUNTER — Telehealth: Payer: Self-pay | Admitting: Nurse Practitioner

## 2018-05-28 ENCOUNTER — Other Ambulatory Visit: Payer: Self-pay | Admitting: Nurse Practitioner

## 2018-05-28 DIAGNOSIS — J45909 Unspecified asthma, uncomplicated: Secondary | ICD-10-CM

## 2018-05-28 MED FILL — ALBUTEROL SULFATE HFA 108 (: 108 (90 BAS | 25 days supply | Qty: 9 | Fill #0

## 2018-05-28 NOTE — Telephone Encounter (Signed)
Pt called in stating she was having shortness of breath and neck pains advised to go to ED please follow up

## 2018-05-28 NOTE — Telephone Encounter (Signed)
New Message   1) Medication(s) Requested (by name): albuterol (PROVENTIL) (2.5 MG/3ML) 0.083% nebulizer solution  2) Pharmacy of Choice: [facility]  3) Special Requests:   Approved medications will be sent to the pharmacy, we will reach out if there is an issue.  Requests made after 3pm may not be addressed until the following business day!  If a patient is unsure of the name of the medication(s) please note and ask patient to call back when they are able to provide all info, do not send to responsible party until all information is available!

## 2018-05-28 NOTE — Telephone Encounter (Signed)
Agree with recommendations as she has poorly controlled asthma.

## 2018-05-29 MED ORDER — ALBUTEROL SULFATE (2.5 MG/3ML) 0.083% IN NEBU: 2.5000 mg | INHALATION_SOLUTION | Freq: Four times a day (QID) | RESPIRATORY_TRACT | 2 refills | Status: DC | PRN

## 2018-05-29 MED FILL — ALBUTEROL SUL 2.5 MG/3 ML S: (2.5 MG/3ML | 12 days supply | Qty: 150 | Fill #0

## 2018-06-27 MED FILL — ALBUTEROL SUL 2.5 MG/3 ML S: (2.5 MG/3ML | 12 days supply | Qty: 150 | Fill #1

## 2018-07-01 DIAGNOSIS — Z20828 Contact with and (suspected) exposure to other viral communicable diseases: Secondary | ICD-10-CM | POA: Diagnosis not present

## 2018-07-30 ENCOUNTER — Other Ambulatory Visit: Payer: Self-pay | Admitting: Nurse Practitioner

## 2018-07-30 DIAGNOSIS — I10 Essential (primary) hypertension: Secondary | ICD-10-CM

## 2018-07-30 MED FILL — LISINOPRIL 40 MG TABLET: 40 | 60 days supply | Qty: 60 | Fill #1

## 2018-07-30 MED FILL — ALBUTEROL SUL 2.5 MG/3 ML S: (2.5 MG/3ML | 12 days supply | Qty: 150 | Fill #2

## 2018-07-30 MED FILL — ALBUTEROL SULFATE HFA 108 (: 108 (90 BAS | 25 days supply | Qty: 9 | Fill #1

## 2018-08-22 ENCOUNTER — Ambulatory Visit: Payer: Medicaid Other | Attending: Nurse Practitioner | Admitting: Nurse Practitioner

## 2018-08-22 ENCOUNTER — Ambulatory Visit: Payer: Medicaid Other

## 2018-08-22 ENCOUNTER — Other Ambulatory Visit: Payer: Self-pay

## 2018-08-22 ENCOUNTER — Encounter: Payer: Self-pay | Admitting: Nurse Practitioner

## 2018-08-22 DIAGNOSIS — R102 Pelvic and perineal pain: Secondary | ICD-10-CM | POA: Diagnosis not present

## 2018-08-22 DIAGNOSIS — D573 Sickle-cell trait: Secondary | ICD-10-CM | POA: Insufficient documentation

## 2018-08-22 DIAGNOSIS — Z87891 Personal history of nicotine dependence: Secondary | ICD-10-CM | POA: Diagnosis not present

## 2018-08-22 DIAGNOSIS — Z8249 Family history of ischemic heart disease and other diseases of the circulatory system: Secondary | ICD-10-CM | POA: Insufficient documentation

## 2018-08-22 DIAGNOSIS — J45909 Unspecified asthma, uncomplicated: Secondary | ICD-10-CM | POA: Diagnosis not present

## 2018-08-22 DIAGNOSIS — D869 Sarcoidosis, unspecified: Secondary | ICD-10-CM | POA: Insufficient documentation

## 2018-08-22 DIAGNOSIS — I10 Essential (primary) hypertension: Secondary | ICD-10-CM | POA: Insufficient documentation

## 2018-08-22 NOTE — Progress Notes (Signed)
Virtual Visit via Telephone Note Due to national recommendations of social distancing due to [place] 19, telehealth visit is felt to be most appropriate for this patient at this time.  I discussed the limitations, risks, security and privacy concerns of performing an evaluation and management service by telephone and the availability of in person appointments. I also discussed with the patient that there may be a patient responsible charge related to this service. The patient expressed understanding and agreed to proceed.    I connected with Teresa Mendez on 08/22/18  at   4:10 PM EDT  EDT by telephone and verified that I am speaking with the correct person using two identifiers.   Consent I discussed the limitations, risks, security and privacy concerns of performing an evaluation and management service by telephone and the availability of in person appointments. I also discussed with the patient that there may be a patient responsible charge related to this service. The patient expressed understanding and agreed to proceed.   Location of Patient: Private  Residence   Location of Provider: [place] participating in Telemedicine visit: Geryl Rankins FNP-BC [place] [facility] [place]    History of Present Illness: Telemedicine visit for: Pelvic Pain  Endorses right sided pelvic pain aggravated during her menstrual cycles. She has not had a pap smear and cant not recall the last date it was performed nor can she recall if it was normal. She denies any GU symptoms such as dysuria, hematuria, flank pain, urinary hesitancy, frequency or odor.   Past Medical History:  Diagnosis Date  . Asthma   . Hypertension   . Sarcoidosis   . Sickle cell trait [facility])     Past Surgical History:  Procedure Laterality Date  . TUBAL LIGATION      Family History  Problem Relation Age of Onset  . Hypertension Mother   . Congestive Heart Failure Mother   .  Kidney failure Mother   . Sickle cell trait Mother   . Hypertension Father   . Congestive Heart Failure Father   . Kidney failure Father   . Glaucoma Father     Social History   Socioeconomic History  . Marital status: Single    Spouse name: Not on file  . Number of children: Not on file  . Years of education: Not on file  . Highest education level: Not on file  Occupational History  . Not on file  Social Needs  . Financial resource strain: Not on file  . Food insecurity    Worry: Not on file    Inability: Not on file  . Transportation needs    Medical: Not on file    Non-medical: Not on file  Tobacco Use  . Smoking status: Former Smoker    Packs/day: 0.50    Types: Cigarettes  . Smokeless tobacco: Never Used  . Tobacco comment: Pt. stated she quit a few months back.   Substance and Sexual Activity  . Alcohol use: Yes    Comment: Occasionally   . Drug use: No  . Sexual activity: Yes  Lifestyle  . Physical activity    Days per week: Not on file    Minutes per session: Not on file  . Stress: Not on file  Relationships  . Social Herbalist on phone: Not on file    Gets together: Not on file    Attends religious service: Not on file  Active member of club or organization: Not on file    Attends meetings of clubs or organizations: Not on file    Relationship status: Not on file  Other Topics Concern  . Not on file  Social History Narrative  . Not on file     Observations/Objective: Awake, alert and oriented x 3   Review of Systems  Constitutional: Negative for fever, malaise/fatigue and weight loss.  HENT: Negative.  Negative for nosebleeds.   Eyes: Negative.  Negative for blurred vision, double vision and photophobia.  Respiratory: Negative.  Negative for cough and shortness of breath.   Cardiovascular: Negative.  Negative for chest pain, palpitations and leg swelling.  Gastrointestinal: Positive for abdominal pain. Negative for blood in stool,  constipation, diarrhea, heartburn, melena, nausea and vomiting.  Genitourinary: Negative.  Negative for dysuria, flank pain, frequency, hematuria and urgency.  Musculoskeletal: Positive for back pain (chronic). Negative for myalgias.  Neurological: Negative.  Negative for dizziness, focal weakness, seizures and headaches.  Psychiatric/Behavioral: Negative.  Negative for suicidal ideas.    Assessment and Plan: Diagnoses and all orders for this visit:  Pelvic pain -     US PELVIC COMPLETE WITH TRANSVAGINAL; Future     Follow Up Instructions Return in about 3 weeks (around 09/12/2018), or if symptoms worsen or fail to improve, for PAP and wet prep.     I discussed the assessment and treatment plan with the patient. The patient was provided an opportunity to ask questions and all were answered. The patient agreed with the plan and demonstrated an understanding of the instructions.   The patient was advised to call back or seek an in-person evaluation if the symptoms worsen or if the condition fails to improve as anticipated.  I provided 18 minutes of non-face-to-face time during this encounter including median intraservice time, reviewing previous notes, labs, imaging, medications and explaining diagnosis and management.  Teresa Pounds, FNP-BC

## 2018-09-16 ENCOUNTER — Other Ambulatory Visit: Payer: Medicaid Other | Admitting: Nurse Practitioner

## 2018-09-19 ENCOUNTER — Ambulatory Visit (HOSPITAL_COMMUNITY): Admission: RE | Admit: 2018-09-19 | Payer: Medicaid Other | Source: Ambulatory Visit

## 2018-09-22 ENCOUNTER — Other Ambulatory Visit: Payer: Self-pay | Admitting: Nurse Practitioner

## 2018-09-22 ENCOUNTER — Ambulatory Visit (HOSPITAL_COMMUNITY)
Admission: RE | Admit: 2018-09-22 | Discharge: 2018-09-22 | Disposition: A | Discharge status: Home / Self Care | Payer: Medicaid Other | Source: Ambulatory Visit | Attending: Nurse Practitioner | Admitting: Nurse Practitioner

## 2018-09-22 ENCOUNTER — Other Ambulatory Visit: Payer: Self-pay

## 2018-09-22 DIAGNOSIS — D259 Leiomyoma of uterus, unspecified: Secondary | ICD-10-CM | POA: Diagnosis not present

## 2018-09-22 DIAGNOSIS — R102 Pelvic and perineal pain: Secondary | ICD-10-CM

## 2018-09-25 ENCOUNTER — Encounter: Payer: Self-pay | Admitting: Family Medicine

## 2018-09-30 ENCOUNTER — Other Ambulatory Visit: Payer: Self-pay | Admitting: Nurse Practitioner

## 2018-09-30 DIAGNOSIS — S46911A Strain of unspecified muscle, fascia and tendon at shoulder and upper arm level, right arm, initial encounter: Secondary | ICD-10-CM

## 2018-09-30 DIAGNOSIS — I10 Essential (primary) hypertension: Secondary | ICD-10-CM

## 2018-09-30 MED FILL — AMLODIPINE BESYLATE 10 MG T: 10 | 30 days supply | Qty: 30 | Fill #0

## 2018-09-30 MED FILL — ALBUTEROL SUL 2.5 MG/3 ML S: (2.5 MG/3ML | 12 days supply | Qty: 150 | Fill #2

## 2018-09-30 MED FILL — LISINOPRIL 40 MG TABLET: 40 | 30 days supply | Qty: 30 | Fill #1

## 2018-09-30 MED FILL — HYDROCHLOROTHIAZIDE 25 MG T: 25 | 30 days supply | Qty: 30 | Fill #1

## 2018-09-30 MED FILL — ALBUTEROL SULFATE HFA 108 (: 108 (90 BAS | 25 days supply | Qty: 9 | Fill #1

## 2018-10-01 ENCOUNTER — Telehealth: Payer: Self-pay | Admitting: General Practice

## 2018-10-01 ENCOUNTER — Other Ambulatory Visit: Payer: Self-pay | Admitting: Nurse Practitioner

## 2018-10-01 DIAGNOSIS — S46911A Strain of unspecified muscle, fascia and tendon at shoulder and upper arm level, right arm, initial encounter: Secondary | ICD-10-CM

## 2018-10-01 DIAGNOSIS — I10 Essential (primary) hypertension: Secondary | ICD-10-CM

## 2018-10-01 MED ORDER — IBUPROFEN 600 MG PO TABS: 600.0000 mg | ORAL_TABLET | Freq: Three times a day (TID) | ORAL | 1 refills | Status: DC | PRN

## 2018-10-01 MED ORDER — CYCLOBENZAPRINE HCL 10 MG PO TABS: 10.0000 mg | ORAL_TABLET | Freq: Three times a day (TID) | ORAL | 2 refills | Status: DC | PRN

## 2018-10-01 MED ORDER — AMLODIPINE BESYLATE 10 MG PO TABS: 10.0000 mg | ORAL_TABLET | Freq: Every day | ORAL | 1 refills | Status: DC

## 2018-10-01 NOTE — Telephone Encounter (Signed)
Patient called office and left message on nurse voicemail line stating she is still waiting for refills on her ibuprofen, muscle relaxer, & norvasc. Per chart review, patient is not a patient here and goes to [facility]. Will route message.

## 2018-10-02 MED FILL — IBUPROFEN 600 MG TABLET: 600 | 20 days supply | Qty: 60 | Fill #0

## 2018-10-02 MED FILL — CYCLOBENZAPRINE 10 MG TAB: 10 | 20 days supply | Qty: 60 | Fill #0

## 2018-10-08 ENCOUNTER — Telehealth: Payer: Self-pay | Admitting: Obstetrics & Gynecology

## 2018-10-08 NOTE — Telephone Encounter (Signed)
The patient stated she would like a call back from the nurse to explain exactly what will occur with the visit.   Also, stated she didn't received the ibuprofen and cyclobenzaprine. She stated she would like have a refill. She visits the pharmacy at [place].

## 2018-10-08 NOTE — Telephone Encounter (Signed)
Called patient and discussed she is seeing a gynecologist at her appt in October & we are the specialist. Discussed her u/s results will be reviewed with her & she could likely have an exam as well. Patient verbalized understanding & asked about medication refills. Told patient that would come from her PCP not [place] but I believe she refilled those medications. Advised she check in with the pharmacy & her PCP. Patient verbalized understanding & had no questions.

## 2018-10-22 ENCOUNTER — Other Ambulatory Visit: Payer: Self-pay | Admitting: Pharmacist

## 2018-10-22 ENCOUNTER — Other Ambulatory Visit: Payer: Self-pay

## 2018-10-22 DIAGNOSIS — Z23 Encounter for immunization: Secondary | ICD-10-CM | POA: Diagnosis not present

## 2018-10-22 DIAGNOSIS — R6889 Other general symptoms and signs: Secondary | ICD-10-CM | POA: Diagnosis not present

## 2018-10-22 DIAGNOSIS — Z20822 Contact with and (suspected) exposure to covid-19: Secondary | ICD-10-CM

## 2018-10-22 DIAGNOSIS — J45909 Unspecified asthma, uncomplicated: Secondary | ICD-10-CM

## 2018-10-22 MED ORDER — ALBUTEROL SULFATE (2.5 MG/3ML) 0.083% IN NEBU: 2.5000 mg | INHALATION_SOLUTION | Freq: Four times a day (QID) | RESPIRATORY_TRACT | 2 refills | Status: DC | PRN

## 2018-10-22 MED FILL — IBUPROFEN 600 MG TABLET: 600 | 20 days supply | Qty: 60 | Fill #0

## 2018-10-22 MED FILL — CYCLOBENZAPRINE 10 MG TAB: 10 | 20 days supply | Qty: 60 | Fill #0

## 2018-10-22 MED FILL — ALBUTEROL SUL 2.5 MG/3 ML S: (2.5 MG/3ML | 12 days supply | Qty: 150 | Fill #0

## 2018-10-22 MED FILL — AMLODIPINE BESYLATE 10 MG T: 10 | 30 days supply | Qty: 30 | Fill #0

## 2018-10-24 LAB — NOVEL CORONAVIRUS, NAA: SARS-CoV-2, NAA: NOT DETECTED

## 2018-11-03 ENCOUNTER — Encounter: Payer: Medicaid Other | Admitting: Family Medicine

## 2018-11-04 ENCOUNTER — Encounter: Payer: Medicaid Other | Admitting: Obstetrics & Gynecology

## 2018-11-12 ENCOUNTER — Encounter: Payer: Medicaid Other | Admitting: Obstetrics and Gynecology

## 2018-11-14 DIAGNOSIS — R32 Unspecified urinary incontinence: Secondary | ICD-10-CM | POA: Diagnosis not present

## 2018-11-20 ENCOUNTER — Ambulatory Visit (INDEPENDENT_AMBULATORY_CARE_PROVIDER_SITE_OTHER): Payer: Medicaid Other | Admitting: Obstetrics & Gynecology

## 2018-11-20 ENCOUNTER — Other Ambulatory Visit: Payer: Self-pay

## 2018-11-20 ENCOUNTER — Encounter: Payer: Self-pay | Admitting: Obstetrics & Gynecology

## 2018-11-20 ENCOUNTER — Other Ambulatory Visit (HOSPITAL_COMMUNITY)
Admission: RE | Admit: 2018-11-20 | Discharge: 2018-11-20 | Disposition: A | Discharge status: Home / Self Care | Payer: Medicaid Other | Source: Ambulatory Visit | Attending: Obstetrics & Gynecology | Admitting: Obstetrics & Gynecology

## 2018-11-20 VITALS — BP 149/92 | HR 102 | Temp 98.4°F | Ht 64.0 in | Wt 259.3 lb

## 2018-11-20 DIAGNOSIS — R87611 Atypical squamous cells cannot exclude high grade squamous intraepithelial lesion on cytologic smear of cervix (ASC-H): Secondary | ICD-10-CM | POA: Diagnosis not present

## 2018-11-20 DIAGNOSIS — Z0000 Encounter for general adult medical examination without abnormal findings: Secondary | ICD-10-CM | POA: Insufficient documentation

## 2018-11-20 DIAGNOSIS — D251 Intramural leiomyoma of uterus: Secondary | ICD-10-CM

## 2018-11-20 NOTE — Progress Notes (Signed)
Patient ID: Teresa Mendez, female   DOB: 06/01/1975, 43 y.o.   MRN: [id number]  Chief Complaint  Patient presents with  . Gynecologic Exam  pap test, evaluation for fiboid  HPI Teresa Mendez is a 43 y.o. female.  [id number] Patient's last menstrual period was 10/06/2018 (approximate). S/p BTL, with increased pelvic discomfort and heavier periods. She had an [place] that showed 2-3 cm fibroids. She has back pain and leg pain HPI  Past Medical History:  Diagnosis Date  . Asthma   . Hypertension   . Sarcoidosis   . Sickle cell trait [facility])     Past Surgical History:  Procedure Laterality Date  . TUBAL LIGATION      Family History  Problem Relation Age of Onset  . Hypertension Mother   . Congestive Heart Failure Mother   . Kidney failure Mother   . Sickle cell trait Mother   . Hypertension Father   . Congestive Heart Failure Father   . Kidney failure Father   . Glaucoma Father     Social History Social History   Tobacco Use  . Smoking status: Former Smoker    Packs/day: 0.50    Types: Cigarettes  . Smokeless tobacco: Never Used  . Tobacco comment: Pt. stated she quit a few months back.   Substance Use Topics  . Alcohol use: Yes    Comment: Occasionally   . Drug use: No    No Known Allergies  Current Outpatient Medications  Medication Sig Dispense Refill  . acetaminophen (TYLENOL) 500 MG tablet Take 2 tablets (1,000 mg total) by mouth every 8 (eight) hours as needed for moderate pain. 30 tablet 0  . albuterol (PROVENTIL) (2.5 MG/3ML) 0.083% nebulizer solution Take 3 mLs (2.5 mg total) by nebulization every 6 (six) hours as needed for wheezing or shortness of breath. 150 mL 2  . albuterol (VENTOLIN HFA) 108 (90 Base) MCG/ACT inhaler Inhale 1-2 puffs into the lungs every 6 (six) hours as needed for wheezing or shortness of breath (cough). 18 g 2  . amLODipine (NORVASC) 10 MG tablet Take 1 tablet (10 mg total) by mouth daily. 90 tablet 1  . budesonide-formoterol (SYMBICORT)  160-4.5 MCG/ACT inhaler Inhale 2 puffs into the lungs 2 (two) times daily. 1 Inhaler 3  . cyclobenzaprine (FLEXERIL) 10 MG tablet Take 1 tablet (10 mg total) by mouth 3 (three) times daily as needed for muscle spasms. 60 tablet 2  . hydrochlorothiazide (HYDRODIURIL) 25 MG tablet Take 1 tablet (25 mg total) by mouth daily. 30 tablet 2  . ibuprofen (ADVIL) 600 MG tablet Take 1 tablet (600 mg total) by mouth every 8 (eight) hours as needed. 60 tablet 1  . lisinopril (PRINIVIL,ZESTRIL) 40 MG tablet TAKE 1 TABLET (40 MG TOTAL) BY MOUTH DAILY. 30 tablet 2   No current facility-administered medications for this visit.     Review of Systems Review of Systems  Constitutional: Negative.   Respiratory: Negative.   Gastrointestinal: Negative.   Genitourinary: Positive for menstrual problem and pelvic pain. Negative for vaginal bleeding.  Musculoskeletal: Positive for back pain.       Upper leg pain especially on the right    Blood pressure (!) 149/92, pulse (!) 102, temperature 98.4 F (36.9 C), height 5\' 4"  (1.626 m), weight 259 lb 4.8 oz (117.6 kg), last menstrual period 10/06/2018.  Physical Exam Physical Exam Constitutional:      Appearance: She is obese. She is not ill-appearing.  Pulmonary:     Effort:  Pulmonary effort is normal.  Abdominal:     General: Abdomen is flat. There is no distension.     Palpations: Abdomen is soft. There is no mass.     Tenderness: There is no abdominal tenderness.  Genitourinary:    Comments: Pelvic exam: normal external genitalia, vulva, vagina, cervix, uterus and adnexa.  Musculoskeletal: Normal range of motion.  Neurological:     Mental Status: She is alert.  Psychiatric:        Mood and Affect: Mood normal.   pap was done today  Data Reviewed CLINICAL DATA:  Pelvic pain for 3 months  EXAM: TRANSABDOMINAL AND TRANSVAGINAL ULTRASOUND OF PELVIS  TECHNIQUE: Both transabdominal and transvaginal ultrasound examinations of the pelvis were  performed. Transabdominal technique was performed for global imaging of the pelvis including uterus, ovaries, adnexal regions, and pelvic cul-de-sac. It was necessary to proceed with endovaginal exam following the transabdominal exam to visualize the endometrium and ovaries.  COMPARISON:  None; correlation made with report from a prior ultrasound of 11/02/1997, images of which are not available  FINDINGS: Uterus  Measurements: 11.5 x 4.3 x 5.9 cm = volume: 153 mL. Anteverted. Normal morphology. Posterior intramural leiomyoma on LEFT 2.8 x 2.6 x 2.4 cm. Additional subserosal leiomyoma posterior LEFT 3.1 x 3.1 x 2.4 cm.  Endometrium  Thickness: 11 mm.  No endometrial fluid or focal abnormality  Right ovary  Measurements: 2.7 x 1.8 x 1.6 cm = volume: 4.1 mL. Normal morphology without mass  Left ovary  Measurements: 2.4 x 1.2 x 1.3 cm = volume: 2.0 mL. Normal morphology without mass  Other findings  No free pelvic fluid.  No adnexal masses.  IMPRESSION: 2 uterine leiomyoma posterior LEFT uterus measuring 2.8 cm and 3.1 cm in greatest sizes as above.  Unremarkable endometrial complex, ovaries, and adnexa.   Electronically Signed   By: Lavonia Dana M.D.   On: 09/22/2018 16:52   Assessment Fibroid uterus with minimal associated symptoms Suspect MS back pain  Plan Ibuprofen prn pelvic pain and dysmenorrhea RTC 4-6 months 25 min face to face and coordination of care  Emeterio Reeve 11/20/2018, 9:29 AM

## 2018-11-20 NOTE — Patient Instructions (Signed)

## 2018-11-24 MED FILL — ALBUTEROL SUL 2.5 MG/3 ML S: (2.5 MG/3ML | 12 days supply | Qty: 150 | Fill #1

## 2018-11-25 LAB — CYTOLOGY - PAP
Comment: NEGATIVE
Diagnosis: HIGH — AB
High risk HPV: NEGATIVE

## 2018-11-27 ENCOUNTER — Telehealth: Payer: Self-pay | Admitting: Obstetrics & Gynecology

## 2018-11-27 NOTE — Telephone Encounter (Signed)
Attempted to call patient with her colpo appointment w/ Roselie Awkward ( 11/30 @ 10:55). No answer and could not leave a voicemail because it was full. Reminder mailed

## 2018-12-04 ENCOUNTER — Telehealth: Payer: Self-pay | Admitting: Obstetrics & Gynecology

## 2018-12-04 NOTE — Telephone Encounter (Signed)
Patient has questions they would like a call back from clinical staff.

## 2018-12-05 NOTE — Telephone Encounter (Signed)
Returned pts call. Pt did not answer. LM for pt to cal the office at her earliest convenience or to send a My Chart message with questions or concerns.

## 2018-12-11 ENCOUNTER — Telehealth: Payer: Self-pay | Admitting: *Deleted

## 2018-12-11 DIAGNOSIS — R799 Abnormal finding of blood chemistry, unspecified: Secondary | ICD-10-CM

## 2018-12-11 NOTE — Telephone Encounter (Signed)
Called and spoke with patient. Pt reports her Nebulizer is acting up. It will turn on and shut back off. She has had for years. Asked pt to call her PCP for prescription.   Pt reports she needs a Colposcopy. Pt informed that appt is 11/30 at 10:55 for Colposcopy with Dr. Roselie Awkward. Pt aware of location.   Pt voiced understanding to all the above.

## 2018-12-11 NOTE — Telephone Encounter (Signed)
Natonia left a message this afternoon that she was calling back because she missed the nurse calling her back.  Linda,RN

## 2018-12-12 MED FILL — IBUPROFEN 600 MG TABLET: 600 | 20 days supply | Qty: 60 | Fill #1

## 2018-12-12 MED FILL — CYCLOBENZAPRINE 10 MG TAB: 10 | 20 days supply | Qty: 60 | Fill #1

## 2018-12-12 MED FILL — ALBUTEROL SUL 2.5 MG/3 ML S: (2.5 MG/3ML | 12 days supply | Qty: 150 | Fill #2

## 2018-12-19 DIAGNOSIS — R32 Unspecified urinary incontinence: Secondary | ICD-10-CM | POA: Diagnosis not present

## 2018-12-19 DIAGNOSIS — J45909 Unspecified asthma, uncomplicated: Secondary | ICD-10-CM | POA: Diagnosis not present

## 2018-12-29 ENCOUNTER — Ambulatory Visit: Payer: Medicaid Other | Admitting: Obstetrics & Gynecology

## 2018-12-31 ENCOUNTER — Other Ambulatory Visit: Payer: Self-pay | Admitting: Family Medicine

## 2018-12-31 DIAGNOSIS — J45909 Unspecified asthma, uncomplicated: Secondary | ICD-10-CM

## 2018-12-31 MED FILL — ALBUTEROL SUL 2.5 MG/3 ML S: (2.5 MG/3ML | 12 days supply | Qty: 150 | Fill #0

## 2018-12-31 NOTE — Telephone Encounter (Signed)
1) Medication(s) Requested (by name): albuterol (PROVENTIL) (2.5 MG/3ML) 0.083% nebulizer solution albuterol (VENTOLIN HFA) 108 (90 Base) MCG/ACT inhaler budesonide-formoterol (SYMBICORT) 160-4.5 MCG/ACT inhaler    2) Pharmacy of Choice: [facility] pharmacy   3) Special Requests:   Approved medications will be sent to the pharmacy, we will reach out if there is an issue.  Requests made after 3pm may not be addressed until the following business day!  If a patient is unsure of the name of the medication(s) please note and ask patient to call back when they are able to provide all info, do not send to responsible party until all information is available!

## 2019-01-13 ENCOUNTER — Ambulatory Visit: Payer: Medicaid Other | Admitting: Nurse Practitioner

## 2019-01-22 DIAGNOSIS — R32 Unspecified urinary incontinence: Secondary | ICD-10-CM | POA: Diagnosis not present

## 2019-01-30 HISTORY — PX: BREAST BIOPSY: SHX20

## 2019-02-04 ENCOUNTER — Other Ambulatory Visit: Payer: Self-pay | Admitting: Nurse Practitioner

## 2019-02-04 ENCOUNTER — Ambulatory Visit: Payer: Medicaid Other | Admitting: Obstetrics & Gynecology

## 2019-02-04 DIAGNOSIS — S46911A Strain of unspecified muscle, fascia and tendon at shoulder and upper arm level, right arm, initial encounter: Secondary | ICD-10-CM

## 2019-02-04 MED FILL — ALBUTEROL SUL 2.5 MG/3 ML S: (2.5 MG/3ML | 12 days supply | Qty: 150 | Fill #1

## 2019-02-04 MED FILL — LISINOPRIL 40 MG TABLET: 40 | 30 days supply | Qty: 30 | Fill #2

## 2019-02-04 MED FILL — AMLODIPINE BESYLATE 10 MG T: 10 | 90 days supply | Qty: 90 | Fill #0

## 2019-02-04 MED FILL — HYDROCHLOROTHIAZIDE 25 MG T: 25 | 30 days supply | Qty: 30 | Fill #2

## 2019-02-06 ENCOUNTER — Ambulatory Visit: Payer: Medicaid Other | Attending: Nurse Practitioner | Admitting: Nurse Practitioner

## 2019-02-06 ENCOUNTER — Other Ambulatory Visit: Payer: Self-pay

## 2019-02-06 ENCOUNTER — Encounter: Payer: Self-pay | Admitting: Nurse Practitioner

## 2019-02-06 DIAGNOSIS — Z9851 Tubal ligation status: Secondary | ICD-10-CM | POA: Insufficient documentation

## 2019-02-06 DIAGNOSIS — Z832 Family history of diseases of the blood and blood-forming organs and certain disorders involving the immune mechanism: Secondary | ICD-10-CM | POA: Diagnosis not present

## 2019-02-06 DIAGNOSIS — D573 Sickle-cell trait: Secondary | ICD-10-CM | POA: Diagnosis not present

## 2019-02-06 DIAGNOSIS — M545 Low back pain: Secondary | ICD-10-CM | POA: Diagnosis not present

## 2019-02-06 DIAGNOSIS — M25551 Pain in right hip: Secondary | ICD-10-CM | POA: Insufficient documentation

## 2019-02-06 DIAGNOSIS — Z87891 Personal history of nicotine dependence: Secondary | ICD-10-CM | POA: Insufficient documentation

## 2019-02-06 DIAGNOSIS — J45909 Unspecified asthma, uncomplicated: Secondary | ICD-10-CM | POA: Diagnosis not present

## 2019-02-06 DIAGNOSIS — G8929 Other chronic pain: Secondary | ICD-10-CM | POA: Diagnosis not present

## 2019-02-06 DIAGNOSIS — Z131 Encounter for screening for diabetes mellitus: Secondary | ICD-10-CM

## 2019-02-06 DIAGNOSIS — I10 Essential (primary) hypertension: Secondary | ICD-10-CM

## 2019-02-06 DIAGNOSIS — D649 Anemia, unspecified: Secondary | ICD-10-CM | POA: Diagnosis not present

## 2019-02-06 DIAGNOSIS — R102 Pelvic and perineal pain: Secondary | ICD-10-CM | POA: Diagnosis not present

## 2019-02-06 DIAGNOSIS — M25552 Pain in left hip: Secondary | ICD-10-CM | POA: Insufficient documentation

## 2019-02-06 DIAGNOSIS — Z13220 Encounter for screening for lipoid disorders: Secondary | ICD-10-CM

## 2019-02-06 DIAGNOSIS — Z8249 Family history of ischemic heart disease and other diseases of the circulatory system: Secondary | ICD-10-CM | POA: Insufficient documentation

## 2019-02-06 MED ORDER — ACETAMINOPHEN-CODEINE #3 300-30 MG PO TABS: 1.0000 | ORAL_TABLET | Freq: Three times a day (TID) | ORAL | 0 refills | Status: DC | PRN

## 2019-02-06 NOTE — Progress Notes (Signed)
Virtual Visit via Telephone Note Due to national recommendations of social distancing due to [place] 19, telehealth visit is felt to be most appropriate for this patient at this time.  I discussed the limitations, risks, security and privacy concerns of performing an evaluation and management service by telephone and the availability of in person appointments. I also discussed with the patient that there may be a patient responsible charge related to this service. The patient expressed understanding and agreed to proceed.    I connected with Teresa Mendez on 02/06/19  at   2:10 PM EST  EDT by telephone and verified that I am speaking with the correct person using two identifiers.   Consent I discussed the limitations, risks, security and privacy concerns of performing an evaluation and management service by telephone and the availability of in person appointments. I also discussed with the patient that there may be a patient responsible charge related to this service. The patient expressed understanding and agreed to proceed.   Location of Patient: Private Residence   Location of Provider: [place] participating in Telemedicine visit: Geryl Rankins FNP-BC [place] [facility] [place]    History of Present Illness: Telemedicine visit for: Bilateral Hip, pelvis and lower back pain    Chronic. She initially endorsed pelvic pain and with her history of uterine fibroids she was referred to gynecology who did not feel the source of her pain was related to any GU issues. It was recommended that she return for follow up with me and be worked up for a possible musculoskeletal etiology.  She now states the pain is more in her bilateral hips than the pelvic area. Aggravating factors:  Walking, prolonged standing and weightbearing.  She is taking ibuprofen 600 mg with no relief of pain.Muscle relaxants provide some relief.    Essential Hypertension Has  not been well controlled. Has not been monitoring her blood pressure at home  recently. Taking amlodipine 10 mg daily, HCTZ 25 mg daily and lisinopril 40 mg daily. Denies chest pain, shortness of breath, palpitations, lightheadedness, dizziness, headaches or BLE edema.  BP Readings from Last 3 Encounters:  11/20/18 (!) 149/92  02/07/18 (!) 154/92  05/16/17 137/85   Past Medical History:  Diagnosis Date  . Asthma   . Hypertension   . Sarcoidosis   . Sickle cell trait [facility])     Past Surgical History:  Procedure Laterality Date  . TUBAL LIGATION      Family History  Problem Relation Age of Onset  . Hypertension Mother   . Congestive Heart Failure Mother   . Kidney failure Mother   . Sickle cell trait Mother   . Hypertension Father   . Congestive Heart Failure Father   . Kidney failure Father   . Glaucoma Father     Social History   Socioeconomic History  . Marital status: Single    Spouse name: Not on file  . Number of children: Not on file  . Years of education: Not on file  . Highest education level: Not on file  Occupational History  . Not on file  Tobacco Use  . Smoking status: Former Smoker    Packs/day: 0.50    Types: Cigarettes  . Smokeless tobacco: Never Used  . Tobacco comment: Pt. stated she quit a few months back.   Substance and Sexual Activity  . Alcohol use: Yes    Comment: Occasionally   . Drug use: No  .  Sexual activity: Yes  Other Topics Concern  . Not on file  Social History Narrative  . Not on file   Social Determinants of Health   Financial Resource Strain:   . Difficulty of Paying Living Expenses: Not on file  Food Insecurity:   . Worried About Charity fundraiser in the Last Year: Not on file  . Ran Out of Food in the Last Year: Not on file  Transportation Needs:   . Lack of Transportation (Medical): Not on file  . Lack of Transportation (Non-Medical): Not on file  Physical Activity:   . Days of Exercise per Week: Not on file  .  Minutes of Exercise per Session: Not on file  Stress:   . Feeling of Stress : Not on file  Social Connections:   . Frequency of Communication with Friends and Family: Not on file  . Frequency of Social Gatherings with Friends and Family: Not on file  . Attends Religious Services: Not on file  . Active Member of Clubs or Organizations: Not on file  . Attends Archivist Meetings: Not on file  . Marital Status: Not on file     Observations/Objective: Awake, alert and oriented x 3   Review of Systems  Constitutional: Negative for fever, malaise/fatigue and weight loss.  HENT: Negative.  Negative for nosebleeds.   Eyes: Negative.  Negative for blurred vision, double vision and photophobia.  Respiratory: Negative.  Negative for cough and shortness of breath.   Cardiovascular: Negative.  Negative for chest pain, palpitations and leg swelling.  Gastrointestinal: Negative.  Negative for heartburn, nausea and vomiting.  Musculoskeletal: Positive for back pain and myalgias.  Neurological: Negative.  Negative for dizziness, focal weakness, seizures and headaches.  Psychiatric/Behavioral: Negative.  Negative for suicidal ideas.    Assessment and Plan: [name] was seen today for pelvic pain.  Diagnoses and all orders for this visit:  Chronic bilateral low back pain without sciatica -     acetaminophen-codeine (TYLENOL #3) 300-30 MG tablet; Take 1 tablet by mouth every 8 (eight) hours as needed for moderate pain or severe pain. -     DG Lumbar Spine Complete; Future Work on losing weight to help reduce back pain. May alternate with heat and ice application for pain relief. May also alternate with  Ibuprofen as prescribed for back pain. Other alternatives include massage, acupuncture and water aerobics.  You must stay active and avoid a sedentary lifestyle.   Essential hypertension -     CMP14+EGFR; Future Continue all antihypertensives as prescribed.  Remember to bring in your blood  pressure log with you for your follow up appointment.  DASH/Mediterranean Diets are healthier choices for HTN.    Anemia, unspecified type -     CBC; Future  Encounter for screening for diabetes mellitus -     A1c; Future  Lipid screening -     Lipid Panel; Future     Follow Up Instructions Return in about 3 months (around 05/07/2019).     I discussed the assessment and treatment plan with the patient. The patient was provided an opportunity to ask questions and all were answered. The patient agreed with the plan and demonstrated an understanding of the instructions.   The patient was advised to call back or seek an in-person evaluation if the symptoms worsen or if the condition fails to improve as anticipated.  I provided 17 minutes of non-face-to-face time during this encounter including median intraservice time, reviewing previous notes, labs,  imaging, medications and explaining diagnosis and management.  [name], FNP-BC

## 2019-02-10 ENCOUNTER — Other Ambulatory Visit: Payer: Medicaid Other

## 2019-02-23 ENCOUNTER — Other Ambulatory Visit: Payer: Self-pay | Admitting: Nurse Practitioner

## 2019-02-23 DIAGNOSIS — S46911A Strain of unspecified muscle, fascia and tendon at shoulder and upper arm level, right arm, initial encounter: Secondary | ICD-10-CM

## 2019-02-23 MED FILL — CYCLOBENZAPRINE 10 MG TAB: 10 | 20 days supply | Qty: 60 | Fill #2

## 2019-02-23 MED FILL — ALBUTEROL SUL 2.5 MG/3 ML S: (2.5 MG/3ML | 12 days supply | Qty: 150 | Fill #2

## 2019-02-24 MED FILL — IBUPROFEN 600 MG TABLET: 600 | 20 days supply | Qty: 60 | Fill #0

## 2019-03-03 DIAGNOSIS — R32 Unspecified urinary incontinence: Secondary | ICD-10-CM | POA: Diagnosis not present

## 2019-03-05 DIAGNOSIS — R32 Unspecified urinary incontinence: Secondary | ICD-10-CM | POA: Diagnosis not present

## 2019-03-06 ENCOUNTER — Telehealth: Payer: Self-pay | Admitting: Obstetrics and Gynecology

## 2019-03-06 NOTE — Telephone Encounter (Signed)
Called the patient to complete the covid screening. The patient stated she is having a very difficult time breathing and she feels terrible. She stated she is currently connected to the machine and it's not working. After consulting with the nurse it was decided the patient should go get covid testing to rule it out and also if the treatment does not help once she has completed, visit an urgent care.

## 2019-03-09 ENCOUNTER — Ambulatory Visit: Payer: Medicaid Other | Admitting: Obstetrics & Gynecology

## 2019-03-17 ENCOUNTER — Other Ambulatory Visit: Payer: Self-pay | Admitting: Family Medicine

## 2019-03-17 DIAGNOSIS — J45909 Unspecified asthma, uncomplicated: Secondary | ICD-10-CM

## 2019-03-17 MED FILL — ALBUTEROL SULFATE HFA 108 (: 108 (90 BAS | 25 days supply | Qty: 9 | Fill #2

## 2019-03-18 MED FILL — ALBUTEROL SUL 2.5 MG/3 ML S: (2.5 MG/3ML | 12 days supply | Qty: 150 | Fill #0

## 2019-03-31 DIAGNOSIS — R32 Unspecified urinary incontinence: Secondary | ICD-10-CM | POA: Diagnosis not present

## 2019-04-08 MED FILL — ALBUTEROL SUL 2.5 MG/3 ML S: (2.5 MG/3ML | 12 days supply | Qty: 150 | Fill #1

## 2019-04-09 ENCOUNTER — Encounter: Payer: Self-pay | Admitting: Family Medicine

## 2019-04-09 ENCOUNTER — Ambulatory Visit: Payer: Medicaid Other | Admitting: Obstetrics & Gynecology

## 2019-05-01 DIAGNOSIS — R32 Unspecified urinary incontinence: Secondary | ICD-10-CM | POA: Diagnosis not present

## 2019-05-11 ENCOUNTER — Other Ambulatory Visit: Payer: Self-pay | Admitting: Nurse Practitioner

## 2019-05-11 DIAGNOSIS — J45909 Unspecified asthma, uncomplicated: Secondary | ICD-10-CM

## 2019-05-11 MED FILL — ALBUTEROL SUL 2.5 MG/3 ML S: (2.5 MG/3ML | 12 days supply | Qty: 150 | Fill #2

## 2019-05-12 MED FILL — ALBUTEROL SULFATE HFA 108 (: 108 (90 BAS | 25 days supply | Qty: 18 | Fill #0

## 2019-05-17 ENCOUNTER — Ambulatory Visit (INDEPENDENT_AMBULATORY_CARE_PROVIDER_SITE_OTHER)
Admission: EM | Admit: 2019-05-17 | Discharge: 2019-05-17 | Disposition: A | Discharge status: Other Acute Inpatient Hospital | Payer: Medicaid Other | Source: Home / Self Care

## 2019-05-17 ENCOUNTER — Emergency Department (HOSPITAL_COMMUNITY): Payer: Medicaid Other

## 2019-05-17 ENCOUNTER — Inpatient Hospital Stay (HOSPITAL_COMMUNITY)
Admission: EM | Admit: 2019-05-17 | Discharge: 2019-05-22 | DRG: 189 | Disposition: A | Discharge status: Home / Self Care | Payer: Medicaid Other | Attending: Internal Medicine | Admitting: Internal Medicine

## 2019-05-17 ENCOUNTER — Encounter (HOSPITAL_COMMUNITY): Payer: Self-pay | Admitting: Family Medicine

## 2019-05-17 ENCOUNTER — Other Ambulatory Visit: Payer: Self-pay

## 2019-05-17 ENCOUNTER — Encounter (HOSPITAL_COMMUNITY): Payer: Self-pay

## 2019-05-17 DIAGNOSIS — R079 Chest pain, unspecified: Secondary | ICD-10-CM | POA: Diagnosis present

## 2019-05-17 DIAGNOSIS — D573 Sickle-cell trait: Secondary | ICD-10-CM | POA: Diagnosis not present

## 2019-05-17 DIAGNOSIS — Z8249 Family history of ischemic heart disease and other diseases of the circulatory system: Secondary | ICD-10-CM | POA: Diagnosis not present

## 2019-05-17 DIAGNOSIS — Z20822 Contact with and (suspected) exposure to covid-19: Secondary | ICD-10-CM | POA: Diagnosis present

## 2019-05-17 DIAGNOSIS — J80 Acute respiratory distress syndrome: Secondary | ICD-10-CM | POA: Diagnosis not present

## 2019-05-17 DIAGNOSIS — R7989 Other specified abnormal findings of blood chemistry: Secondary | ICD-10-CM | POA: Diagnosis not present

## 2019-05-17 DIAGNOSIS — I10 Essential (primary) hypertension: Secondary | ICD-10-CM | POA: Diagnosis present

## 2019-05-17 DIAGNOSIS — I878 Other specified disorders of veins: Secondary | ICD-10-CM | POA: Diagnosis present

## 2019-05-17 DIAGNOSIS — I160 Hypertensive urgency: Secondary | ICD-10-CM | POA: Diagnosis present

## 2019-05-17 DIAGNOSIS — J45909 Unspecified asthma, uncomplicated: Secondary | ICD-10-CM | POA: Diagnosis not present

## 2019-05-17 DIAGNOSIS — J441 Chronic obstructive pulmonary disease with (acute) exacerbation: Secondary | ICD-10-CM

## 2019-05-17 DIAGNOSIS — R0602 Shortness of breath: Secondary | ICD-10-CM | POA: Diagnosis not present

## 2019-05-17 DIAGNOSIS — Z83511 Family history of glaucoma: Secondary | ICD-10-CM | POA: Diagnosis not present

## 2019-05-17 DIAGNOSIS — R600 Localized edema: Secondary | ICD-10-CM | POA: Diagnosis not present

## 2019-05-17 DIAGNOSIS — Z832 Family history of diseases of the blood and blood-forming organs and certain disorders involving the immune mechanism: Secondary | ICD-10-CM | POA: Diagnosis not present

## 2019-05-17 DIAGNOSIS — R0689 Other abnormalities of breathing: Secondary | ICD-10-CM | POA: Diagnosis not present

## 2019-05-17 DIAGNOSIS — J9602 Acute respiratory failure with hypercapnia: Secondary | ICD-10-CM | POA: Diagnosis not present

## 2019-05-17 DIAGNOSIS — E8770 Fluid overload, unspecified: Secondary | ICD-10-CM

## 2019-05-17 DIAGNOSIS — J810 Acute pulmonary edema: Principal | ICD-10-CM | POA: Diagnosis present

## 2019-05-17 DIAGNOSIS — J4489 Other specified chronic obstructive pulmonary disease: Secondary | ICD-10-CM | POA: Diagnosis present

## 2019-05-17 DIAGNOSIS — D860 Sarcoidosis of lung: Secondary | ICD-10-CM | POA: Diagnosis present

## 2019-05-17 DIAGNOSIS — J9601 Acute respiratory failure with hypoxia: Principal | ICD-10-CM | POA: Diagnosis present

## 2019-05-17 DIAGNOSIS — J189 Pneumonia, unspecified organism: Secondary | ICD-10-CM | POA: Diagnosis present

## 2019-05-17 DIAGNOSIS — Z7951 Long term (current) use of inhaled steroids: Secondary | ICD-10-CM

## 2019-05-17 DIAGNOSIS — J439 Emphysema, unspecified: Secondary | ICD-10-CM | POA: Diagnosis present

## 2019-05-17 DIAGNOSIS — N920 Excessive and frequent menstruation with regular cycle: Secondary | ICD-10-CM | POA: Diagnosis present

## 2019-05-17 DIAGNOSIS — E876 Hypokalemia: Secondary | ICD-10-CM | POA: Diagnosis present

## 2019-05-17 DIAGNOSIS — D869 Sarcoidosis, unspecified: Secondary | ICD-10-CM | POA: Diagnosis present

## 2019-05-17 DIAGNOSIS — D509 Iron deficiency anemia, unspecified: Secondary | ICD-10-CM | POA: Diagnosis present

## 2019-05-17 DIAGNOSIS — R05 Cough: Secondary | ICD-10-CM | POA: Diagnosis not present

## 2019-05-17 DIAGNOSIS — R000 Tachycardia, unspecified: Secondary | ICD-10-CM | POA: Diagnosis not present

## 2019-05-17 DIAGNOSIS — D259 Leiomyoma of uterus, unspecified: Secondary | ICD-10-CM | POA: Diagnosis present

## 2019-05-17 DIAGNOSIS — Z841 Family history of disorders of kidney and ureter: Secondary | ICD-10-CM

## 2019-05-17 DIAGNOSIS — J449 Chronic obstructive pulmonary disease, unspecified: Secondary | ICD-10-CM | POA: Diagnosis present

## 2019-05-17 DIAGNOSIS — Z87891 Personal history of nicotine dependence: Secondary | ICD-10-CM | POA: Diagnosis not present

## 2019-05-17 DIAGNOSIS — Z6841 Body Mass Index (BMI) 40.0 and over, adult: Secondary | ICD-10-CM | POA: Diagnosis not present

## 2019-05-17 DIAGNOSIS — R0902 Hypoxemia: Secondary | ICD-10-CM | POA: Diagnosis not present

## 2019-05-17 DIAGNOSIS — R062 Wheezing: Secondary | ICD-10-CM

## 2019-05-17 DIAGNOSIS — K219 Gastro-esophageal reflux disease without esophagitis: Secondary | ICD-10-CM | POA: Diagnosis present

## 2019-05-17 LAB — PROTIME-INR
INR: 1 (ref 0.8–1.2)
Prothrombin Time: 12.8 seconds (ref 11.4–15.2)

## 2019-05-17 LAB — POCT I-STAT 7, (LYTES, BLD GAS, ICA,H+H)
Acid-Base Excess: 7 mmol/L — ABNORMAL HIGH (ref 0.0–2.0)
Bicarbonate: 33.8 mmol/L — ABNORMAL HIGH (ref 20.0–28.0)
Calcium, Ion: 1.18 mmol/L (ref 1.15–1.40)
HCT: 30 % — ABNORMAL LOW (ref 36.0–46.0)
Hemoglobin: 10.2 g/dL — ABNORMAL LOW (ref 12.0–15.0)
O2 Saturation: 97 %
Patient temperature: 98.6
Potassium: 3.4 mmol/L — ABNORMAL LOW (ref 3.5–5.1)
Sodium: 139 mmol/L (ref 135–145)
TCO2: 36 mmol/L — ABNORMAL HIGH (ref 22–32)
pCO2 arterial: 62.4 mmHg — ABNORMAL HIGH (ref 32.0–48.0)
pH, Arterial: 7.342 — ABNORMAL LOW (ref 7.350–7.450)
pO2, Arterial: 98 mmHg (ref 83.0–108.0)

## 2019-05-17 LAB — CBC WITH DIFFERENTIAL/PLATELET
Abs Immature Granulocytes: 0.09 10*3/uL — ABNORMAL HIGH (ref 0.00–0.07)
Basophils Absolute: 0.1 10*3/uL (ref 0.0–0.1)
Basophils Relative: 0 %
Eosinophils Absolute: 0.3 10*3/uL (ref 0.0–0.5)
Eosinophils Relative: 1 %
HCT: 33.8 % — ABNORMAL LOW (ref 36.0–46.0)
Hemoglobin: 9.9 g/dL — ABNORMAL LOW (ref 12.0–15.0)
Immature Granulocytes: 1 %
Lymphocytes Relative: 13 %
Lymphs Abs: 2.6 10*3/uL (ref 0.7–4.0)
MCH: 19.7 pg — ABNORMAL LOW (ref 26.0–34.0)
MCHC: 29.3 g/dL — ABNORMAL LOW (ref 30.0–36.0)
MCV: 67.2 fL — ABNORMAL LOW (ref 80.0–100.0)
Monocytes Absolute: 1.1 10*3/uL — ABNORMAL HIGH (ref 0.1–1.0)
Monocytes Relative: 6 %
Neutro Abs: 15.3 10*3/uL — ABNORMAL HIGH (ref 1.7–7.7)
Neutrophils Relative %: 79 %
Platelets: 512 10*3/uL — ABNORMAL HIGH (ref 150–400)
RBC: 5.03 MIL/uL (ref 3.87–5.11)
RDW: 17.9 % — ABNORMAL HIGH (ref 11.5–15.5)
WBC: 19.4 10*3/uL — ABNORMAL HIGH (ref 4.0–10.5)
nRBC: 0 % (ref 0.0–0.2)

## 2019-05-17 LAB — RESPIRATORY PANEL BY RT PCR (FLU A&B, COVID)
Influenza A by PCR: NEGATIVE
Influenza B by PCR: NEGATIVE
SARS Coronavirus 2 by RT PCR: NEGATIVE

## 2019-05-17 LAB — COMPREHENSIVE METABOLIC PANEL
ALT: 47 U/L — ABNORMAL HIGH (ref 0–44)
AST: 52 U/L — ABNORMAL HIGH (ref 15–41)
Albumin: 3.7 g/dL (ref 3.5–5.0)
Alkaline Phosphatase: 69 U/L (ref 38–126)
Anion gap: 12 (ref 5–15)
BUN: 9 mg/dL (ref 6–20)
CO2: 30 mmol/L (ref 22–32)
Calcium: 8.5 mg/dL — ABNORMAL LOW (ref 8.9–10.3)
Chloride: 98 mmol/L (ref 98–111)
Creatinine, Ser: 0.87 mg/dL (ref 0.44–1.00)
GFR calc Af Amer: >60 mL/min (ref >60)
GFR calc non Af Amer: >60 mL/min (ref >60)
Glucose, Bld: 138 mg/dL — ABNORMAL HIGH (ref 70–99)
Potassium: 3.7 mmol/L (ref 3.5–5.1)
Sodium: 140 mmol/L (ref 135–145)
Total Bilirubin: 0.7 mg/dL (ref 0.3–1.2)
Total Protein: 7.3 g/dL (ref 6.5–8.1)

## 2019-05-17 LAB — I-STAT BETA HCG BLOOD, ED (MC, WL, AP ONLY): I-stat hCG, quantitative: <5 m[IU]/mL (ref <5)

## 2019-05-17 LAB — BRAIN NATRIURETIC PEPTIDE: B Natriuretic Peptide: 243.1 pg/mL — ABNORMAL HIGH (ref 0.0–100.0)

## 2019-05-17 LAB — LACTIC ACID, PLASMA: Lactic Acid, Venous: 0.9 mmol/L (ref 0.5–1.9)

## 2019-05-17 LAB — TROPONIN I (HIGH SENSITIVITY)
Troponin I (High Sensitivity): 58 ng/L — ABNORMAL HIGH (ref <18)
Troponin I (High Sensitivity): 76 ng/L — ABNORMAL HIGH (ref <18)

## 2019-05-17 LAB — LIPASE, BLOOD: Lipase: 15 U/L (ref 11–51)

## 2019-05-17 LAB — D-DIMER, QUANTITATIVE: D-Dimer, Quant: 0.75 ug/mL-FEU — ABNORMAL HIGH (ref 0.00–0.50)

## 2019-05-17 MED ORDER — PREDNISONE 20 MG PO TABS: 40.0000 mg | ORAL_TABLET | Freq: Every day | ORAL | Status: DC

## 2019-05-17 MED ORDER — METHYLPREDNISOLONE SODIUM SUCC 40 MG IJ SOLR: 40.0000 mg | Freq: Every day | INTRAMUSCULAR | Status: DC

## 2019-05-17 MED ORDER — SODIUM CHLORIDE 0.9 % IV SOLN
1.0000 g | Freq: Once | INTRAVENOUS | Status: AC
  Administered 2019-05-17: 21:00:00 1 g via INTRAVENOUS
  Filled 2019-05-17: qty 10

## 2019-05-17 MED ORDER — ALBUTEROL SULFATE HFA 108 (90 BASE) MCG/ACT IN AERS
2.0000 | INHALATION_SPRAY | Freq: Once | RESPIRATORY_TRACT | Status: AC
  Administered 2019-05-17: 16:00:00 2 via RESPIRATORY_TRACT
  Filled 2019-05-17: qty 6.7

## 2019-05-17 MED ORDER — SODIUM CHLORIDE 0.9 % IV SOLN
500.0000 mg | Freq: Once | INTRAVENOUS | Status: AC
  Administered 2019-05-17: 22:00:00 500 mg via INTRAVENOUS
  Filled 2019-05-17: qty 500

## 2019-05-17 MED ORDER — NITROGLYCERIN IN D5W 200-5 MCG/ML-% IV SOLN
0.0000 ug/min | INTRAVENOUS | Status: DC
  Administered 2019-05-17: 16:00:00 5 ug/min via INTRAVENOUS
  Administered 2019-05-18: 11:00:00 90 ug/min via INTRAVENOUS
  Administered 2019-05-18: 21:00:00 80 ug/min via INTRAVENOUS
  Administered 2019-05-18: 02:00:00 90 ug/min via INTRAVENOUS
  Filled 2019-05-17 (×6): qty 250

## 2019-05-17 MED ORDER — IOHEXOL 350 MG/ML SOLN
75.0000 mL | Freq: Once | INTRAVENOUS | Status: AC | PRN
  Administered 2019-05-17: 20:00:00 75 mL via INTRAVENOUS

## 2019-05-17 MED ORDER — FUROSEMIDE 10 MG/ML IJ SOLN
40.0000 mg | Freq: Every day | INTRAMUSCULAR | Status: DC
  Administered 2019-05-17: 40 mg via INTRAVENOUS
  Filled 2019-05-17: qty 4

## 2019-05-17 MED ORDER — METHYLPREDNISOLONE SODIUM SUCC 125 MG IJ SOLR
INTRAMUSCULAR | Status: AC
  Filled 2019-05-17: qty 2

## 2019-05-17 MED ORDER — IPRATROPIUM-ALBUTEROL 0.5-2.5 (3) MG/3ML IN SOLN
3.0000 mL | Freq: Four times a day (QID) | RESPIRATORY_TRACT | Status: DC
  Administered 2019-05-18: 01:00:00 3 mL via RESPIRATORY_TRACT
  Filled 2019-05-17: qty 3

## 2019-05-17 MED ORDER — IPRATROPIUM BROMIDE HFA 17 MCG/ACT IN AERS
2.0000 | INHALATION_SPRAY | Freq: Once | RESPIRATORY_TRACT | Status: AC
  Administered 2019-05-17: 2 via RESPIRATORY_TRACT
  Filled 2019-05-17: qty 12.9

## 2019-05-17 MED ORDER — METHYLPREDNISOLONE SODIUM SUCC 40 MG IJ SOLR
40.0000 mg | Freq: Every day | INTRAMUSCULAR | Status: DC
  Administered 2019-05-18: 11:00:00 40 mg via INTRAVENOUS
  Filled 2019-05-17 (×2): qty 1

## 2019-05-17 MED ORDER — METHYLPREDNISOLONE SODIUM SUCC 125 MG IJ SOLR
125.0000 mg | Freq: Once | INTRAMUSCULAR | Status: AC
  Administered 2019-05-17: 125 mg via INTRAVENOUS

## 2019-05-17 NOTE — ED Notes (Signed)
Pt transported to ED via EMS Patient is being discharged from the Urgent [place] and sent to the Emergency Department via EMS. Per Dr. Joseph Art, patient is stable but in need of higher level of care due to resp. distress Patient is aware and verbalizes understanding of plan of care.  Vitals:   05/17/19 1511 05/17/19 1512  BP: (!) 202/133   Pulse:  (!) 135  Resp:  (!) 40  SpO2: 100%

## 2019-05-17 NOTE — ED Triage Notes (Signed)
Pt from UC with ems for respiratory distress x2 days, hx of asthma and COPD. Pt given 1G mag and solu medrol at UC. Pt arrives on NRB at 15L. HR 130s, BP 216/148. RR labored and rapid.

## 2019-05-17 NOTE — ED Triage Notes (Signed)
Pt in acute resp distress on arrival [place] with pursed lip breathing. Unable to speak but one word at a time. SPO2 82% on RA. Place on 15L [place] immediately and SPO2 increased to 92% but WOB still significant. Audible wheezes heard.  Placed on non-rebreather at 15L and SpO2 increase to 100%. PIV initiated and 125mg  solumedrol given IVP. EMS called immediately after arrival.

## 2019-05-17 NOTE — Progress Notes (Signed)
Patient transported from ED RESUS room to CT and back with no complications.

## 2019-05-17 NOTE — H&P (Deleted)
Duplicate note entered in error

## 2019-05-17 NOTE — ED Notes (Signed)
Pt transported to CT with RN and RT

## 2019-05-17 NOTE — Consult Note (Signed)
NAME:  Teresa Mendez, MRN:  [id number], DOB:  05-04-1975, LOS: 0 ADMISSION DATE:  05/17/2019, CONSULTATION DATE:  05/17/19 REFERRING MD:  EDP, CHIEF COMPLAINT:  Hypoxic respiratory failure   Brief History   44 y.o. F with PMH of sarcoidosis and asthma who presented with respiratory distress, wheezing and hypoxia.  She was placed on 15L [place] then BiPAP and treated for asthma exacerbation.  PCCM asked to consult.    History of present illness   Teresa Mendez is a 44 y.o. F with PMH of HTN, sarcoidosis and asthma with moderate obstruction on (PFT's 58yrs ago) who noted worsening dyspnea, productive cough, sinus drainage and fatigue over the last week after attending an outside funeral.  She has seasonal allergies and thinks the pollen initiated her symptoms.  No known ill contacts and she is fully vaccinated for Covid-19.   She notes no fever or significant chest pain, mildly worsening LE edema.  She used home albuterol nebs and symbicort without relief, a friend had extra Prednisone 20mg , so she has been taking that for several days. She notes mild lower abdominal pain without diarrhea, urinary symptoms or nausea/vomiting.   Pt initially presented to urgent care where she was given Solumedrol and Magnesium 1g and sent to the ED.  On arrival, pt was initially hypoxic to 82% on RA with obvious wheezing, placed on 15L nasal cannula and then BiPAP with improvement in O2 sats.  Pt was c/o CP and was significantly hypertensive, so started on nitro gtt and given Azithromycin and Ceftriaxone.  Covid-19 and influenza negatie.   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     On evaluation, pt is awake and alert and in no distress and satting 98% on BiPAP.  She reports resolution of her chest pain and blood pressure has rate improved to 155/110.  Wheezing has also improved significantly.  ABG 7.34/62/98/33  Past Medical History   has a past medical  history of Asthma, Hypertension, Sarcoidosis, and Sickle cell trait ([place]).   Significant Hospital Events   05/17/19 presented to the ED in respiratory distress, PCCM consult, admit to stepdown   Consults:  PCCM  Procedures:    Significant Diagnostic Tests:  05/17/19  CTA>> was emphysematous changes in the upper lobes, GGO left lung base, no PE  Micro Data:  4/18 SARS-Covid-2 and influenza>> negative  Antimicrobials:  Ceftriaxone 4/18- Azithromycin 4/18-  Interim history/subjective:  Significant improvement in respiratory status after Solu-Medrol and mag with nitro drip  Objective   Blood pressure (!) 153/105, pulse (!) 107, temperature 98.6 F (37 C), temperature source Oral, resp. rate 19, height 5\' 4"  (1.626 m), weight 115.2 kg, SpO2 95 %.    Vent Mode: BIPAP FiO2 (%):  [30 %-40 %] 30 % PEEP:  [6 cmH20] 6 cmH20  No intake or output data in the 24 hours ending 05/17/19 2129 Filed Weights   05/17/19 1618  Weight: 115.2 kg   General: Resting comfortably on BiPAP, awake and alert and conversational distress HEENT: MM pink/moist Neuro: Awake and alert, oriented x3 CV: s1s2 tachycardic, regular rate and rhythm, no m/r/g PULM: Trace expiratory wheezing respiratory distress, no accessory muscle use GI: soft, bsx4 active, mild lower abdominal pain without peritoneal signs Extremities: warm/dry, 1+ edema  Skin: no rashes or lesions    Resolved Hospital Problem list   Chest pain  Assessment & Plan:   Acute hypoxic respiratory  failure likely secondary to asthma exacerbation vs developing pneumonia -improved significantly since arrival and tolerating Bipap -No PE P: -Continue Ceftriaxone and  Azithromycin -Prednisone 40mg  daily -Scheduled duonebs -Continue Bipap overnight, suspect she can likely wean in the AM -check strep pneumo and respiratory culture -Given significant improvement in respiratory status, she is appropriate for step-down at this time.  PCCM will  continue to follow and please re-engage if her clinical status deteriorates -she would benefit from outpatient pulmonary follow up, pt would appreciate assistance scheduling appt   HTN, chest pain, mildly elevated troponin -Improved with nitro gtt and CP resolved, no acute ischemic changes in EKG P: -attempt to wean nitro and transition to po anti-hypertensives -check echo, trend troponin -Lasix 40mg  qd        Labs   CBC: Recent Labs  Lab 05/17/19 1608  WBC 19.4*  NEUTROABS 15.3*  HGB 9.9*  HCT 33.8*  MCV 67.2*  PLT 512*    Basic Metabolic Panel: Recent Labs  Lab 05/17/19 1608  NA 140  K 3.7  CL 98  CO2 30  GLUCOSE 138*  BUN 9  CREATININE 0.87  CALCIUM 8.5*   GFR: Estimated Creatinine Clearance: 100.6 mL/min (by C-G formula based on SCr of 0.87 mg/dL). Recent Labs  Lab 05/17/19 1608  WBC 19.4*  LATICACIDVEN 0.9    Liver Function Tests: Recent Labs  Lab 05/17/19 1608  AST 52*  ALT 47*  ALKPHOS 69  BILITOT 0.7  PROT 7.3  ALBUMIN 3.7   Recent Labs  Lab 05/17/19 1608  LIPASE 15   No results for input(s): AMMONIA in the last 168 hours.  ABG No results found for: PHART, PCO2ART, PO2ART, HCO3, TCO2, ACIDBASEDEF, O2SAT   Coagulation Profile: Recent Labs  Lab 05/17/19 1608  INR 1.0    Cardiac Enzymes: No results for input(s): CKTOTAL, CKMB, CKMBINDEX, TROPONINI in the last 168 hours.  HbA1C: Hemoglobin A1C  Date/Time Value Ref Range Status  08/22/2016 09:44 AM 5.0  Final    CBG: No results for input(s): GLUCAP in the last 168 hours.  Review of Systems:   Negative except as noted in HPI  Past Medical History  She,  has a past medical history of Asthma, Hypertension, Sarcoidosis, and Sickle cell trait ([place]).   Surgical History    Past Surgical History:  Procedure Laterality Date  . TUBAL LIGATION       Social History   reports that she has quit smoking. Her smoking use included cigarettes. She smoked 0.50 packs per day. She  has never used smokeless tobacco. She reports current alcohol use. She reports that she does not use drugs.   Family History   Her family history includes Congestive Heart Failure in her father and mother; Glaucoma in her father; Hypertension in her father and mother; Kidney failure in her father and mother; Sickle cell trait in her mother.   Allergies No Known Allergies   Home Medications  Prior to Admission medications   Medication Sig Start Date End Date Taking? Authorizing Provider  acetaminophen-codeine (TYLENOL #3) 300-30 MG tablet Take 1 tablet by mouth every 8 (eight) hours as needed for moderate pain or severe pain. 02/06/19   [name], NP  albuterol (PROVENTIL) (2.5 MG/3ML) 0.083% nebulizer solution TAKE 3 MLS (2.5 MG TOTAL) BY NEBULIZATION EVERY 6 (SIX) HOURS AS NEEDED FOR WHEEZING OR SHORTNESS OF BREATH. 03/17/19   [name], MD  albuterol (VENTOLIN HFA) 108 (90 Base) MCG/ACT inhaler INHALE 1-2 PUFFS INTO THE LUNGS  EVERY 6 (SIX) HOURS AS NEEDED FOR WHEEZING OR SHORTNESS OF BREATH (COUGH). 05/11/19   [name], MD  amLODipine (NORVASC) 10 MG tablet Take 1 tablet (10 mg total) by mouth daily. 10/01/18 12/30/18  [name], NP  budesonide-formoterol (SYMBICORT) 160-4.5 MCG/ACT inhaler Inhale 2 puffs into the lungs 2 (two) times daily. 04/24/17   [name], NP  cyclobenzaprine (FLEXERIL) 10 MG tablet Take 1 tablet (10 mg total) by mouth 3 (three) times daily as needed for muscle spasms. 10/01/18   [name], NP  hydrochlorothiazide (HYDRODIURIL) 25 MG tablet Take 1 tablet (25 mg total) by mouth daily. 02/07/18   [name], NP  ibuprofen (ADVIL) 600 MG tablet TAKE 1 TABLET (600 MG TOTAL) BY MOUTH EVERY 8 (EIGHT) HOURS AS NEEDED. 02/23/19   [name], NP  lisinopril (PRINIVIL,ZESTRIL) 40 MG tablet TAKE 1 TABLET (40 MG TOTAL) BY MOUTH DAILY. 03/28/18   [name], MD     Critical care time: 55 minutes      CRITICAL CARE Performed by:  Otilio Carpen Meyer Arora   Total critical care time: 55 minutes  Critical care time was exclusive of separately billable procedures and treating other patients.  Critical care was necessary to treat or prevent imminent or life-threatening deterioration.  Critical care was time spent personally by me on the following activities: development of treatment plan with patient and/or surrogate as well as nursing, discussions with consultants, evaluation of patient's response to treatment, examination of patient, obtaining history from patient or surrogate, ordering and performing treatments and interventions, ordering and review of laboratory studies, ordering and review of radiographic studies, pulse oximetry and re-evaluation of patient's condition.    Otilio Carpen Levaeh Vice, PA-C

## 2019-05-17 NOTE — ED Provider Notes (Signed)
[facility]    CSN: [id number] Arrival date & time: 05/17/19  1452      History   Chief Complaint Chief Complaint  Patient presents with  . Shortness of Breath    HPI Teresa Mendez is a 44 y.o. female.   Initial visit for this 44 year old woman with sarcoidosis and asthma who presents with 2 days of increasing shortness of breath.  She was brought to the urgent care in extreme distress and immediately attended to.  Patient was seen gasping for breath with some mild chest pain.  She has had similar episodes of acute shortness of breath in the past.  She denies vomiting or syncope.  She has been using her inhalers.       Past Medical History:  Diagnosis Date  . Asthma   . Hypertension   . Sarcoidosis   . Sickle cell trait [facility])     Patient Active Problem List   Diagnosis Date Noted  . 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 Essential hypertension 06/27/2007  . DYSPNEA 06/27/2007    Past Surgical History:  Procedure Laterality Date  . TUBAL LIGATION      OB History    Gravida  6   Para  3   Term  3   Preterm      AB  3   Living  3     SAB  3   TAB      Ectopic      Multiple      Live Births  3            Home Medications    Prior to Admission medications   Medication Sig Start Date End Date Taking? Authorizing Provider  acetaminophen-codeine (TYLENOL #3) 300-30 MG tablet Take 1 tablet by mouth every 8 (eight) hours as needed for moderate pain or severe pain. 02/06/19   [name], NP  albuterol (PROVENTIL) (2.5 MG/3ML) 0.083% nebulizer solution TAKE 3 MLS (2.5 MG TOTAL) BY NEBULIZATION EVERY 6 (SIX) HOURS AS NEEDED FOR WHEEZING OR SHORTNESS OF BREATH. 03/17/19   [name], MD  albuterol (VENTOLIN HFA) 108 (90 Base) MCG/ACT inhaler INHALE 1-2 PUFFS INTO THE LUNGS EVERY 6 (SIX) HOURS AS NEEDED FOR WHEEZING OR SHORTNESS OF BREATH (COUGH). 05/11/19   [name], MD  amLODipine (NORVASC)  10 MG tablet Take 1 tablet (10 mg total) by mouth daily. 10/01/18 12/30/18  [name], NP  budesonide-formoterol (SYMBICORT) 160-4.5 MCG/ACT inhaler Inhale 2 puffs into the lungs 2 (two) times daily. 04/24/17   [name], NP  cyclobenzaprine (FLEXERIL) 10 MG tablet Take 1 tablet (10 mg total) by mouth 3 (three) times daily as needed for muscle spasms. 10/01/18   [name], NP  hydrochlorothiazide (HYDRODIURIL) 25 MG tablet Take 1 tablet (25 mg total) by mouth daily. 02/07/18   [name], NP  ibuprofen (ADVIL) 600 MG tablet TAKE 1 TABLET (600 MG TOTAL) BY MOUTH EVERY 8 (EIGHT) HOURS AS NEEDED. 02/23/19   [name], NP  lisinopril (PRINIVIL,ZESTRIL) 40 MG tablet TAKE 1 TABLET (40 MG TOTAL) BY MOUTH DAILY. 03/28/18   [name], MD    Family History Family History  Problem Relation Age of Onset  . Hypertension Mother   . Congestive Heart Failure Mother   . Kidney failure Mother   . Sickle cell trait Mother   . Hypertension Father   . Congestive Heart Failure Father   . Kidney failure  Father   . Glaucoma Father     Social History Social History   Tobacco Use  . Smoking status: Former Smoker    Packs/day: 0.50    Types: Cigarettes  . Smokeless tobacco: Never Used  . Tobacco comment: Pt. stated she quit a few months back.   Substance Use Topics  . Alcohol use: Yes    Comment: Occasionally   . Drug use: No     Allergies   Patient has no known allergies.   Review of Systems Review of Systems  Constitutional: Positive for diaphoresis and fatigue. Negative for fever.  Respiratory: Positive for cough, chest tightness, shortness of breath and wheezing.   All other systems reviewed and are negative.    Physical Exam Triage Vital Signs ED Triage Vitals [05/17/19 1511]  Enc Vitals Group     BP (!) 202/133     Pulse      Resp      Temp      Temp src      SpO2 100 %     Weight      Height      Head Circumference      Peak Flow      Pain  Score      Pain Loc      Pain Edu?      Excl. in [place]?    No data found.  Updated Vital Signs BP (!) 202/133 (BP Location: Right Arm)   Pulse (!) 135   Resp (!) 40   SpO2 100%    Physical Exam Vitals and nursing note reviewed.  Constitutional:      General: She is in acute distress.     Appearance: She is obese. She is ill-appearing.  HENT:     Head: Normocephalic.     Mouth/Throat:     Mouth: Mucous membranes are moist.  Eyes:     Pupils: Pupils are equal, round, and reactive to light.  Cardiovascular:     Rate and Rhythm: Tachycardia present.  Pulmonary:     Effort: Tachypnea and respiratory distress present.     Comments: Decreased breath sounds diffusely with faint wheezes Musculoskeletal:        General: Normal range of motion.     Cervical back: Normal range of motion and neck supple.  Skin:    General: Skin is warm.  Neurological:     General: No focal deficit present.     Mental Status: She is alert.  Psychiatric:        Behavior: Behavior is agitated.    Once in the exam room, patient was started on oxygen at 6 L nasal cannula and shortly thereafter switched to a 100% nonrebreathing mask.  Patient's initial O2 gradually increased to over 90% from an initial value of 82%  Within 10 minutes, IV was started and 125 mg of Solu-Medrol was given IV push with a flush following.  UC Treatments / Results  Labs (all labs ordered are listed, but only abnormal results are displayed) Labs Reviewed - No data to display  EKG   Radiology No results found.  Procedures Procedures (including critical care time)  Medications Ordered in UC Medications  methylPREDNISolone sodium succinate (SOLU-MEDROL) 125 mg/2 mL injection 125 mg (125 mg Intravenous Given 05/17/19 1508)    Initial Impression / Assessment and Plan / UC Course  I have reviewed the triage vital signs and the nursing notes.  Pertinent labs & imaging results that were available during my  care of the  patient were reviewed by me and considered in my medical decision making (see chart for details).    Final Clinical Impressions(s) / UC Diagnoses   Final diagnoses:  COPD exacerbation ([place])  SOB (shortness of breath)     Discharge Instructions     Patient transferred via ambulance to emergency room    ED Prescriptions    None     PDMP not reviewed this encounter.   Robyn Haber, MD 05/17/19 1517

## 2019-05-17 NOTE — Discharge Instructions (Addendum)
Patient transferred via ambulance to emergency room

## 2019-05-18 ENCOUNTER — Inpatient Hospital Stay (HOSPITAL_COMMUNITY): Payer: Medicaid Other

## 2019-05-18 ENCOUNTER — Encounter (HOSPITAL_COMMUNITY): Payer: Self-pay | Admitting: Internal Medicine

## 2019-05-18 DIAGNOSIS — R600 Localized edema: Secondary | ICD-10-CM

## 2019-05-18 DIAGNOSIS — I160 Hypertensive urgency: Secondary | ICD-10-CM | POA: Diagnosis present

## 2019-05-18 DIAGNOSIS — R0602 Shortness of breath: Secondary | ICD-10-CM | POA: Diagnosis not present

## 2019-05-18 DIAGNOSIS — J189 Pneumonia, unspecified organism: Secondary | ICD-10-CM | POA: Insufficient documentation

## 2019-05-18 DIAGNOSIS — R079 Chest pain, unspecified: Secondary | ICD-10-CM | POA: Diagnosis present

## 2019-05-18 LAB — FERRITIN: Ferritin: 6 ng/mL — ABNORMAL LOW (ref 11–307)

## 2019-05-18 LAB — POCT I-STAT 7, (LYTES, BLD GAS, ICA,H+H)
Acid-Base Excess: 11 mmol/L — ABNORMAL HIGH (ref 0.0–2.0)
Bicarbonate: 36.2 mmol/L — ABNORMAL HIGH (ref 20.0–28.0)
Calcium, Ion: 1.18 mmol/L (ref 1.15–1.40)
HCT: 28 % — ABNORMAL LOW (ref 36.0–46.0)
Hemoglobin: 9.5 g/dL — ABNORMAL LOW (ref 12.0–15.0)
O2 Saturation: 94 %
Patient temperature: 98.6
Potassium: 3.1 mmol/L — ABNORMAL LOW (ref 3.5–5.1)
Sodium: 141 mmol/L (ref 135–145)
TCO2: 38 mmol/L — ABNORMAL HIGH (ref 22–32)
pCO2 arterial: 53.4 mmHg — ABNORMAL HIGH (ref 32.0–48.0)
pH, Arterial: 7.44 (ref 7.350–7.450)
pO2, Arterial: 70 mmHg — ABNORMAL LOW (ref 83.0–108.0)

## 2019-05-18 LAB — IRON AND TIBC
Iron: 12 ug/dL — ABNORMAL LOW (ref 28–170)
Saturation Ratios: 3 % — ABNORMAL LOW (ref 10.4–31.8)
TIBC: 459 ug/dL — ABNORMAL HIGH (ref 250–450)
UIBC: 447 ug/dL

## 2019-05-18 LAB — COMPREHENSIVE METABOLIC PANEL
ALT: 39 U/L (ref 0–44)
AST: 26 U/L (ref 15–41)
Albumin: 3.2 g/dL — ABNORMAL LOW (ref 3.5–5.0)
Alkaline Phosphatase: 56 U/L (ref 38–126)
Anion gap: 12 (ref 5–15)
BUN: 10 mg/dL (ref 6–20)
CO2: 31 mmol/L (ref 22–32)
Calcium: 8.4 mg/dL — ABNORMAL LOW (ref 8.9–10.3)
Chloride: 95 mmol/L — ABNORMAL LOW (ref 98–111)
Creatinine, Ser: 0.94 mg/dL (ref 0.44–1.00)
GFR calc Af Amer: >60 mL/min (ref >60)
GFR calc non Af Amer: >60 mL/min (ref >60)
Glucose, Bld: 165 mg/dL — ABNORMAL HIGH (ref 70–99)
Potassium: 3.6 mmol/L (ref 3.5–5.1)
Sodium: 138 mmol/L (ref 135–145)
Total Bilirubin: 0.7 mg/dL (ref 0.3–1.2)
Total Protein: 6.6 g/dL (ref 6.5–8.1)

## 2019-05-18 LAB — CBC WITH DIFFERENTIAL/PLATELET
Abs Immature Granulocytes: 0.07 10*3/uL (ref 0.00–0.07)
Basophils Absolute: 0 10*3/uL (ref 0.0–0.1)
Basophils Relative: 0 %
Eosinophils Absolute: 0 10*3/uL (ref 0.0–0.5)
Eosinophils Relative: 0 %
HCT: 30 % — ABNORMAL LOW (ref 36.0–46.0)
Hemoglobin: 8.7 g/dL — ABNORMAL LOW (ref 12.0–15.0)
Immature Granulocytes: 1 %
Lymphocytes Relative: 9 %
Lymphs Abs: 1.2 10*3/uL (ref 0.7–4.0)
MCH: 19.5 pg — ABNORMAL LOW (ref 26.0–34.0)
MCHC: 29 g/dL — ABNORMAL LOW (ref 30.0–36.0)
MCV: 67.1 fL — ABNORMAL LOW (ref 80.0–100.0)
Monocytes Absolute: 0.3 10*3/uL (ref 0.1–1.0)
Monocytes Relative: 3 %
Neutro Abs: 11.5 10*3/uL — ABNORMAL HIGH (ref 1.7–7.7)
Neutrophils Relative %: 87 %
Platelets: 449 10*3/uL — ABNORMAL HIGH (ref 150–400)
RBC: 4.47 MIL/uL (ref 3.87–5.11)
RDW: 17.6 % — ABNORMAL HIGH (ref 11.5–15.5)
WBC: 13.1 10*3/uL — ABNORMAL HIGH (ref 4.0–10.5)
nRBC: 0 % (ref 0.0–0.2)

## 2019-05-18 LAB — URINALYSIS, ROUTINE W REFLEX MICROSCOPIC
Bilirubin Urine: NEGATIVE
Glucose, UA: NEGATIVE mg/dL
Hgb urine dipstick: NEGATIVE
Ketones, ur: NEGATIVE mg/dL
Nitrite: NEGATIVE
Protein, ur: 30 mg/dL — AB
Specific Gravity, Urine: 1.016 (ref 1.005–1.030)
pH: 6 (ref 5.0–8.0)

## 2019-05-18 LAB — FOLATE: Folate: 13.2 ng/mL (ref >5.9)

## 2019-05-18 LAB — ECHOCARDIOGRAM COMPLETE
Height: 64 in
Weight: 4064 oz

## 2019-05-18 LAB — TROPONIN I (HIGH SENSITIVITY)
Troponin I (High Sensitivity): 53 ng/L — ABNORMAL HIGH (ref <18)
Troponin I (High Sensitivity): 43 ng/L — ABNORMAL HIGH (ref <18)

## 2019-05-18 LAB — STREP PNEUMONIAE URINARY ANTIGEN: Strep Pneumo Urinary Antigen: NEGATIVE

## 2019-05-18 LAB — VITAMIN B12: Vitamin B-12: 530 pg/mL (ref 180–914)

## 2019-05-18 LAB — HIV ANTIBODY (ROUTINE TESTING W REFLEX): HIV Screen 4th Generation wRfx: NONREACTIVE

## 2019-05-18 LAB — PROCALCITONIN: Procalcitonin: <0.1 ng/mL

## 2019-05-18 MED ORDER — LEVALBUTEROL HCL 0.63 MG/3ML IN NEBU: 0.6300 mg | INHALATION_SOLUTION | Freq: Two times a day (BID) | RESPIRATORY_TRACT | Status: DC

## 2019-05-18 MED ORDER — AMLODIPINE BESYLATE 10 MG PO TABS
10.0000 mg | ORAL_TABLET | Freq: Every day | ORAL | Status: DC
  Administered 2019-05-18 – 2019-05-22 (×5): 10 mg via ORAL
  Filled 2019-05-18 (×3): qty 1
  Filled 2019-05-18: qty 2
  Filled 2019-05-18: qty 1

## 2019-05-18 MED ORDER — LEVALBUTEROL HCL 0.63 MG/3ML IN NEBU
0.6300 mg | INHALATION_SOLUTION | Freq: Four times a day (QID) | RESPIRATORY_TRACT | Status: DC
  Administered 2019-05-18: 0.63 mg via RESPIRATORY_TRACT
  Filled 2019-05-18: qty 3

## 2019-05-18 MED ORDER — ENOXAPARIN SODIUM 40 MG/0.4ML ~~LOC~~ SOLN
40.0000 mg | SUBCUTANEOUS | Status: DC
  Administered 2019-05-18 – 2019-05-22 (×5): 40 mg via SUBCUTANEOUS
  Filled 2019-05-18 (×5): qty 0.4

## 2019-05-18 MED ORDER — BUDESONIDE 0.25 MG/2ML IN SUSP
0.2500 mg | Freq: Two times a day (BID) | RESPIRATORY_TRACT | Status: DC
  Administered 2019-05-18 – 2019-05-19 (×3): 0.25 mg via RESPIRATORY_TRACT
  Filled 2019-05-18 (×5): qty 2

## 2019-05-18 MED ORDER — ALBUTEROL SULFATE (2.5 MG/3ML) 0.083% IN NEBU
2.5000 mg | INHALATION_SOLUTION | RESPIRATORY_TRACT | Status: DC | PRN
  Administered 2019-05-20 (×3): 2.5 mg via RESPIRATORY_TRACT
  Filled 2019-05-18 (×3): qty 3

## 2019-05-18 MED ORDER — HYDROCHLOROTHIAZIDE 25 MG PO TABS
25.0000 mg | ORAL_TABLET | Freq: Every day | ORAL | Status: DC
  Administered 2019-05-18 – 2019-05-22 (×5): 25 mg via ORAL
  Filled 2019-05-18 (×5): qty 1

## 2019-05-18 MED ORDER — ASPIRIN 81 MG PO CHEW
81.0000 mg | CHEWABLE_TABLET | Freq: Every day | ORAL | Status: DC
  Administered 2019-05-18 – 2019-05-22 (×5): 81 mg via ORAL
  Filled 2019-05-18 (×5): qty 1

## 2019-05-18 MED ORDER — ALBUTEROL SULFATE (2.5 MG/3ML) 0.083% IN NEBU
2.5000 mg | INHALATION_SOLUTION | RESPIRATORY_TRACT | Status: DC
  Administered 2019-05-18: 2.5 mg via RESPIRATORY_TRACT
  Filled 2019-05-18: qty 3

## 2019-05-18 MED ORDER — SODIUM CHLORIDE 0.9 % IV SOLN
500.0000 mg | INTRAVENOUS | Status: DC
  Administered 2019-05-18 – 2019-05-19 (×2): 500 mg via INTRAVENOUS
  Filled 2019-05-18 (×3): qty 500

## 2019-05-18 MED ORDER — SODIUM CHLORIDE 0.9 % IV SOLN
2.0000 g | INTRAVENOUS | Status: DC
  Administered 2019-05-18 – 2019-05-21 (×4): 2 g via INTRAVENOUS
  Filled 2019-05-18 (×2): qty 2
  Filled 2019-05-18: qty 20
  Filled 2019-05-18 (×2): qty 2

## 2019-05-18 MED ORDER — LEVALBUTEROL HCL 0.63 MG/3ML IN NEBU
0.6300 mg | INHALATION_SOLUTION | Freq: Three times a day (TID) | RESPIRATORY_TRACT | Status: DC
  Administered 2019-05-19: 07:00:00 0.63 mg via RESPIRATORY_TRACT
  Filled 2019-05-18: qty 3

## 2019-05-18 MED ORDER — LISINOPRIL 20 MG PO TABS
40.0000 mg | ORAL_TABLET | Freq: Every day | ORAL | Status: DC
  Administered 2019-05-18 – 2019-05-22 (×5): 40 mg via ORAL
  Filled 2019-05-18 (×5): qty 2

## 2019-05-18 NOTE — Progress Notes (Signed)
  Echocardiogram 2D Echocardiogram has been performed.  Prentis Langdon G Lamont Glasscock 05/18/2019, 8:40 AM

## 2019-05-18 NOTE — Progress Notes (Signed)
PROGRESS NOTE  Teresa Mendez [id number] DOB: 12-07-1975 DOA: 05/17/2019 PCP: [name], NP  HPI/Recap of past 24 hours: HPI from Dr Maura Crandall is a 44 y.o. female with history of HTN, asthma, sarcoidosis presents to the ER because of worsening shortness of breath ongoing for last 1 week which started off after patient attended funeral.  Patient had some sinus discharge which gradually worsened to cough and shortness of breath.  Patient has not had any chest pain until patient reached the ER which lasted a few minutes and resolved without any intervention. In the ER patient was requiring BiPAP to maintain sats.  CT angiogram of the chest was negative for pulmonary embolism but does show bilateral groundglass opacities.  Covid test was negative.  Blood pressure was more than [id number] systolic by [id number] diastolic for which patient was started on nitroglycerin infusion. Lasix was given.  Patient also started on empiric antibiotics and nebulizer.  Initially critical care was consulted since patient symptoms was improving patient is admitted to stepdown.  Labs reveal AST 52 ALT is 47 BNP 241 high sensitive troponin was 58 and 76.  Sinus tachycardia nonspecific ST changes.  WBC count was 19.4 hemoglobin 9.9 platelets 512.  Patient admitted for acute respiratory failure hypoxia likely combination of possible pulmonary edema, pneumonia and asthma.    Today, patient still reports shortness of breath with wheezing noted, still tachycardic/tachypneic, denies any chest pain, abdominal pain, nausea/vomiting, fever/chills.   Assessment/Plan: Principal Problem:   Acute respiratory failure with hypoxia (HCC) Active Problems:   PULMONARY SARCOIDOSIS   Intrinsic asthma   Hypertensive urgency   Chest pain   Acute hypoxic and hypercarbic respiratory failure likely 2/2 hypertensive crisis/flash pulmonary edema Possible CAP Possible asthma exacerbation Initially requiring BiPAP, currently on 3  L of O2 saturating above 95% Currently afebrile, with leukocytosis (on steroids) COVID-19 negative, flu negative ABG showed pH of 7.34, PCO2 62.4, PO2 98, will repeat Procalcitonin pending CTA chest showed 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, groundglass opacity could reflect early infiltrate/pneumonia, no evidence of PE PCCM consulted, appreciate recs BP with better control on nitroglycerin drip, plan to wean off Continue azithromycin, ceftriaxone Continue Solu-Medrol, plan to taper, DuoNeb, Pulmicort Supplemental oxygen as needed Monitor closely in progressive unit  Hypertensive crisis Improving Plan to wean off nitroglycerin infusion Continue home lisinopril, hydrochlorothiazide, amlodipine  Atypical chest pain Currently no chest pain Likely 2/2 above Troponins with flat trend, no delta gap, EKG with no acute ST changes Echo showed EF of 60 to 65%, no regional wall motion abnormalities, mild left ventricular hypertrophy, left ventricular diastolic parameters were indeterminate Telemetry  Microcytic anemia Anemia panel pending Daily CBC  History of sarcoidosis Continue outpatient follow-up  Tobacco abuse Reports about a pack a day, has not smoked for the past 2 weeks Advised to quit  Morbid obesity Lifestyle modification advised       Malnutrition Type:      Malnutrition Characteristics:      Nutrition Interventions:       Estimated body mass index is 43.6 kg/m as calculated from the following:   Height as of this encounter: 5\' 4"  (1.626 m).   Weight as of this encounter: 115.2 kg.     Code Status: Full  Family Communication: Discussed extensively with patient  Disposition Plan: Patient came from home, likely DC home in 24 to 48 hours once significantly improved.  Still requiring IV steroids, nitroglycerin drip, antibiotics   Consultants:  PCCM  Procedures:  None  Antimicrobials:  Ceftriaxone  Azithromycin  DVT  prophylaxis: Lovenox   Objective: Vitals:   05/18/19 0730 05/18/19 0743 05/18/19 0800 05/18/19 0810  BP: 139/76  (!) 143/89   Pulse: (!) 109 (!) 110 (!) 112 (!) 107  Resp: (!) 31 (!) 22 (!) 32 (!) 22  Temp:      TempSrc:      SpO2: 93% 96% 92% 95%  Weight:      Height:        Intake/Output Summary (Last 24 hours) at 05/18/2019 1057 Last data filed at 05/18/2019 0019 Gross per 24 hour  Intake --  Output 1000 ml  Net -1000 ml   Filed Weights   05/17/19 1618  Weight: 115.2 kg    Exam:  General: NAD, acutely ill-appearing, tachypneic  Cardiovascular: S1, S2 present, tachycardic  Respiratory:  Diminished breath sounds bilaterally, bilateral wheezing noted  Abdomen: Soft, nontender, nondistended, bowel sounds present  Musculoskeletal: Trace bilateral pedal edema noted  Skin: Normal  Psychiatry: Normal mood   Data Reviewed: CBC: Recent Labs  Lab 05/17/19 1608 05/17/19 2138 05/18/19 0213  WBC 19.4*  --  13.1*  NEUTROABS 15.3*  --  11.5*  HGB 9.9* 10.2* 8.7*  HCT 33.8* 30.0* 30.0*  MCV 67.2*  --  67.1*  PLT 512*  --  [id number]*   Basic Metabolic Panel: Recent Labs  Lab 05/17/19 1608 05/17/19 2138 05/18/19 0213  NA 140 139 138  K 3.7 3.4* 3.6  CL 98  --  95*  CO2 30  --  31  GLUCOSE 138*  --  165*  BUN 9  --  10  CREATININE 0.87  --  0.94  CALCIUM 8.5*  --  8.4*   GFR: Estimated Creatinine Clearance: 93.1 mL/min (by C-G formula based on SCr of 0.94 mg/dL). Liver Function Tests: Recent Labs  Lab 05/17/19 1608 05/18/19 0213  AST 52* 26  ALT 47* 39  ALKPHOS 69 56  BILITOT 0.7 0.7  PROT 7.3 6.6  ALBUMIN 3.7 3.2*   Recent Labs  Lab 05/17/19 1608  LIPASE 15   No results for input(s): AMMONIA in the last 168 hours. Coagulation Profile: Recent Labs  Lab 05/17/19 1608  INR 1.0   Cardiac Enzymes: No results for input(s): CKTOTAL, CKMB, CKMBINDEX, TROPONINI in the last 168 hours. BNP (last 3 results) No results for input(s): PROBNP in the  last 8760 hours. HbA1C: No results for input(s): HGBA1C in the last 72 hours. CBG: No results for input(s): GLUCAP in the last 168 hours. Lipid Profile: No results for input(s): CHOL, HDL, LDLCALC, TRIG, CHOLHDL, LDLDIRECT in the last 72 hours. Thyroid Function Tests: No results for input(s): TSH, T4TOTAL, FREET4, T3FREE, THYROIDAB in the last 72 hours. Anemia Panel: No results for input(s): VITAMINB12, FOLATE, FERRITIN, TIBC, IRON, RETICCTPCT in the last 72 hours. Urine analysis:    Component Value Date/Time   COLORURINE STRAW (A) 05/18/2019 0021   APPEARANCEUR CLEAR 05/18/2019 0021   LABSPEC 1.016 05/18/2019 0021   PHURINE 6.0 05/18/2019 0021   GLUCOSEU NEGATIVE 05/18/2019 0021   HGBUR NEGATIVE 05/18/2019 0021   BILIRUBINUR NEGATIVE 05/18/2019 0021   BILIRUBINUR Negative 04/24/2017 1032   KETONESUR NEGATIVE 05/18/2019 0021   PROTEINUR 30 (A) 05/18/2019 0021   UROBILINOGEN 0.2 04/24/2017 1032   UROBILINOGEN 1.0 08/18/2015 1144   NITRITE NEGATIVE 05/18/2019 0021   LEUKOCYTESUR SMALL (A) 05/18/2019 0021   Sepsis Labs: @LABRCNTIP (procalcitonin:4,lacticidven:4)  ) Recent Results (from the past 240 hour(s))  Respiratory Panel  by RT PCR (Flu A&B, Covid) - Nasopharyngeal Swab     Status: None   Collection Time: 05/17/19  3:48 PM   Specimen: Nasopharyngeal Swab  Result Value Ref Range Status   SARS Coronavirus 2 by RT PCR NEGATIVE NEGATIVE Final    Comment: (NOTE) SARS-CoV-2 target nucleic acids are NOT DETECTED. The SARS-CoV-2 RNA is generally detectable in upper respiratoy specimens during the acute phase of infection. The lowest concentration of SARS-CoV-2 viral copies this assay can detect is 131 copies/mL. A negative result does not preclude SARS-Cov-2 infection and should not be used as the sole basis for treatment or other patient management decisions. A negative result may occur with  improper specimen collection/handling, submission of specimen other than  nasopharyngeal swab, presence of viral mutation(s) within the areas targeted by this assay, and inadequate number of viral copies (<131 copies/mL). A negative result must be combined with clinical observations, patient history, and epidemiological information. The expected result is Negative. Fact Sheet for Patients:  PinkCheek.be Fact Sheet for Healthcare Providers:  GravelBags.it This test is not yet ap proved or cleared by the [place] FDA and  has been authorized for detection and/or diagnosis of SARS-CoV-2 by FDA under an Emergency Use Authorization (EUA). This EUA will remain  in effect (meaning this test can be used) for the duration of the COVID-19 declaration under Section 564(b)(1) of the Act, 21 U.S.C. section 360bbb-3(b)(1), unless the authorization is terminated or revoked sooner.    Influenza A by PCR NEGATIVE NEGATIVE Final   Influenza B by PCR NEGATIVE NEGATIVE Final    Comment: (NOTE) The Xpert Xpress SARS-CoV-2/FLU/RSV assay is intended as an aid in  the diagnosis of influenza from Nasopharyngeal swab specimens and  should not be used as a sole basis for treatment. Nasal washings and  aspirates are unacceptable for Xpert Xpress SARS-CoV-2/FLU/RSV  testing. Fact Sheet for Patients: PinkCheek.be Fact Sheet for Healthcare Providers: GravelBags.it This test is not yet approved or cleared by the [place] FDA and  has been authorized for detection and/or diagnosis of SARS-CoV-2 by  FDA under an Emergency Use Authorization (EUA). This EUA will remain  in effect (meaning this test can be used) for the duration of the  Covid-19 declaration under Section 564(b)(1) of the Act, 21  U.S.C. section 360bbb-3(b)(1), unless the authorization is  terminated or revoked. Performed at [facility], [place] [address] [place],  [address]        Studies: CT Angio Chest PE W and/or Wo Contrast  Result Date: 05/17/2019 CLINICAL DATA:  Sarcoidosis, asthma.  Positive D-dimer. EXAM: CT ANGIOGRAPHY CHEST WITH CONTRAST TECHNIQUE: Multidetector CT imaging of the chest was performed using the standard protocol during bolus administration of intravenous contrast. Multiplanar CT image reconstructions and MIPs were obtained to evaluate the vascular anatomy. CONTRAST:  25mL OMNIPAQUE IOHEXOL 350 MG/ML SOLN COMPARISON:  06/10/2007 FINDINGS: Cardiovascular: No filling defects in the pulmonary arteries to suggest pulmonary emboli. Heart is normal size. Aorta is normal caliber. Mediastinum/Nodes: No mediastinal, hilar, or axillary adenopathy. Trachea and esophagus are unremarkable. Thyroid unremarkable. Lungs/Pleura: Large bulla in the upper lobes bilaterally. Scarring in the left lower lobe. Ground-glass airspace opacity in the left lower lobe at the left lung base. No effusions. Upper Abdomen: Imaging into the upper abdomen shows no acute findings. Musculoskeletal: Chest wall soft tissues are unremarkable. No acute bony abnormality. Review of the MIP images confirms the above findings. 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 Ground-glass opacity posterolaterally  at the left lung base could reflect early infiltrate/pneumonia. No evidence of pulmonary embolus. Electronically Signed   By: Rolm Baptise M.D.   On: 05/17/2019 20:21   DG Chest Portable 1 View  Result Date: 05/17/2019 CLINICAL DATA:  Respiratory distress, cough. EXAM: PORTABLE CHEST 1 VIEW COMPARISON:  November 08, 2015. FINDINGS: The heart size and mediastinal contours are within normal limits. No pneumothorax or pleural effusion is noted. Bleb is noted in right upper lobe. Lungs are otherwise clear given overlying soft tissue artifact. The visualized skeletal structures are unremarkable. IMPRESSION: No active disease. Electronically Signed   By: Marijo Conception M.D.   On:  05/17/2019 16:37   ECHOCARDIOGRAM COMPLETE  Result Date: 05/18/2019    ECHOCARDIOGRAM REPORT   Patient Name:   HOLLIANN MCCARTT Date of Exam: 05/18/2019 Medical Rec #:  [id number]      Height:       64.0 in Accession #:    [id number]     Weight:       254.0 lb Date of Birth:  [birth year]       BSA:          2.166 m Patient Age:    17 years       BP:           143/89 mmHg Patient Gender: F              HR:           107 bpm. Exam Location:  Inpatient Procedure: 2D Echo, Cardiac Doppler and Color Doppler Indications:    R60.0 Lower extremity edema  History:        Patient has no prior history of Echocardiogram examinations.                 Risk Factors:Hypertension. Sickle Cell Trait. Sarcoidosis.  Sonographer:    Jonelle Sidle Dance Referring Phys: Johnson  1. Left ventricular ejection fraction, by estimation, is 60 to 65%. The left ventricle has normal function. The left ventricle has no regional wall motion abnormalities. There is mild left ventricular hypertrophy. Left ventricular diastolic parameters are indeterminate.  2. Right ventricular systolic function is normal. The right ventricular size is normal.  3. Left atrial size was mildly dilated.  4. The mitral valve is normal in structure. No evidence of mitral valve regurgitation. No evidence of mitral stenosis.  5. The aortic valve is tricuspid. Aortic valve regurgitation is trivial. No aortic stenosis is present.  6. Aortic dilatation noted. Aneurysm of the ascending aorta, measuring 38 mm.  7. The inferior vena cava is normal in size with greater than 50% respiratory variability, suggesting right atrial pressure of 3 mmHg. FINDINGS  Left Ventricle: Left ventricular ejection fraction, by estimation, is 60 to 65%. The left ventricle has normal function. The left ventricle has no regional wall motion abnormalities. The left ventricular internal cavity size was normal in size. There is  mild left ventricular hypertrophy. Left ventricular  diastolic parameters are indeterminate. Right Ventricle: The right ventricular size is normal. No increase in right ventricular wall thickness. Right ventricular systolic function is normal. Left Atrium: Left atrial size was mildly dilated. Right Atrium: Right atrial size was normal in size. Pericardium: There is no evidence of pericardial effusion. Mitral Valve: The mitral valve is normal in structure. Normal mobility of the mitral valve leaflets. No evidence of mitral valve regurgitation. No evidence of mitral valve stenosis. Tricuspid Valve: The tricuspid valve is normal in structure.  Tricuspid valve regurgitation is not demonstrated. No evidence of tricuspid stenosis. Aortic Valve: The aortic valve is tricuspid. Aortic valve regurgitation is trivial. No aortic stenosis is present. Pulmonic Valve: The pulmonic valve was normal in structure. Pulmonic valve regurgitation is not visualized. No evidence of pulmonic stenosis. Aorta: Aortic dilatation noted. There is an aneurysm involving the ascending aorta. The aneurysm measures 38 mm. Venous: The inferior vena cava is normal in size with greater than 50% respiratory variability, suggesting right atrial pressure of 3 mmHg. IAS/Shunts: No atrial level shunt detected by color flow Doppler.  LEFT VENTRICLE PLAX 2D LVIDd:         4.54 cm LVIDs:         3.05 cm LV PW:         1.34 cm LV IVS:        1.20 cm LVOT diam:     1.90 cm LV SV:         54 LV SV Index:   25 LVOT Area:     2.84 cm  RIGHT VENTRICLE             IVC RV Basal diam:  2.88 cm     IVC diam: 1.40 cm RV S prime:     14.50 cm/s TAPSE (M-mode): 2.3 cm LEFT ATRIUM             Index       RIGHT ATRIUM           Index LA diam:        4.30 cm 1.99 cm/m  RA Area:     16.90 cm LA Vol (A2C):   88.9 ml 41.05 ml/m RA Volume:   49.10 ml  22.67 ml/m LA Vol (A4C):   53.6 ml 24.75 ml/m LA Biplane Vol: 72.4 ml 33.43 ml/m  AORTIC VALVE LVOT Vmax:   109.00 cm/s LVOT Vmean:  69.300 cm/s LVOT VTI:    0.189 m  AORTA Ao  Root diam: 3.20 cm Ao Asc diam:  3.80 cm MV A velocity: 101.75 cm/s                             SHUNTS                             Systemic VTI:  0.19 m                             Systemic Diam: 1.90 cm Candee Furbish MD Electronically signed by Candee Furbish MD Signature Date/Time: 05/18/2019/10:36:29 AM    Final     Scheduled Meds: . albuterol  2.5 mg Nebulization Q4H  . amLODipine  10 mg Oral Daily  . aspirin  81 mg Oral Daily  . budesonide (PULMICORT) nebulizer solution  0.25 mg Nebulization BID  . enoxaparin (LOVENOX) injection  40 mg Subcutaneous Q24H  . hydrochlorothiazide  25 mg Oral Daily  . lisinopril  40 mg Oral Daily  . methylPREDNISolone (SOLU-MEDROL) injection  40 mg Intravenous Daily    Continuous Infusions: . azithromycin    . cefTRIAXone (ROCEPHIN)  IV    . nitroGLYCERIN 90 mcg/min (05/18/19 0222)     LOS: 1 day     Alma Friendly, MD [facility]  If 7PM-7AM, please contact night-coverage [email or web address] 05/18/2019, 10:57 AM

## 2019-05-18 NOTE — ED Notes (Signed)
Patient transferred onto inpatient hospital bed and repositioned. Family at bedside.

## 2019-05-18 NOTE — Progress Notes (Signed)
Pt transferred to 2c03 via bed, monitor and O2. Pt transferred to [facility] because pt is on nitro drip. Will continue to monitor pt. Ranelle Oyster, RN

## 2019-05-18 NOTE — Progress Notes (Signed)
Attempted to give report to [place] on [place]. Per secretary Enid Derry is unable to take report at this time. Enid Derry will call me back to get report. Will continue to monitor pt. Ranelle Oyster, RN

## 2019-05-18 NOTE — ED Provider Notes (Signed)
[facility] EMERGENCY DEPARTMENT Provider Note   CSN: [id number] Arrival date & time: 05/17/19  1539     History Chief Complaint  Patient presents with  . Respiratory Distress    Teresa Mendez is a 44 y.o. female.  The history is provided by the patient, medical records and the EMS personnel. No language interpreter was used.  Shortness of Breath Severity:  Severe Onset quality:  Gradual Duration:  3 days Timing:  Constant Progression:  Worsening Chronicity:  New Context: URI   Relieved by:  Nothing Worsened by:  Coughing and deep breathing Ineffective treatments:  None tried Associated symptoms: cough   Associated symptoms: no abdominal pain, no chest pain, no fever, no headaches, no neck pain, no rash, no sputum production, no vomiting and no wheezing   Risk factors: obesity   Risk factors: no hx of PE/DVT        Past Medical History:  Diagnosis Date  . Asthma   . Hypertension   . Sarcoidosis   . Sickle cell trait [facility])     Patient Active Problem List   Diagnosis Date Noted  . Acute respiratory failure with hypoxia ([place]) 05/17/2019  . 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 Essential hypertension 06/27/2007  . DYSPNEA 06/27/2007    Past Surgical History:  Procedure Laterality Date  . TUBAL LIGATION       OB History    Gravida  6   Para  3   Term  3   Preterm      AB  3   Living  3     SAB  3   TAB      Ectopic      Multiple      Live Births  3           Family History  Problem Relation Age of Onset  . Hypertension Mother   . Congestive Heart Failure Mother   . Kidney failure Mother   . Sickle cell trait Mother   . Hypertension Father   . Congestive Heart Failure Father   . Kidney failure Father   . Glaucoma Father     Social History   Tobacco Use  . Smoking status: Former Smoker    Packs/day: 0.50    Types: Cigarettes  . Smokeless tobacco: Never Used  . Tobacco  comment: Pt. stated she quit a few months back.   Substance Use Topics  . Alcohol use: Yes    Comment: Occasionally   . Drug use: No    Home Medications Prior to Admission medications   Medication Sig Start Date End Date Taking? Authorizing Provider  albuterol (PROVENTIL) (2.5 MG/3ML) 0.083% nebulizer solution TAKE 3 MLS (2.5 MG TOTAL) BY NEBULIZATION EVERY 6 (SIX) HOURS AS NEEDED FOR WHEEZING OR SHORTNESS OF BREATH. 03/17/19  Yes Newlin, Enobong, MD  albuterol (VENTOLIN HFA) 108 (90 Base) MCG/ACT inhaler INHALE 1-2 PUFFS INTO THE LUNGS EVERY 6 (SIX) HOURS AS NEEDED FOR WHEEZING OR SHORTNESS OF BREATH (COUGH). 05/11/19  Yes [name], MD  amLODipine (NORVASC) 10 MG tablet Take 1 tablet (10 mg total) by mouth daily. 10/01/18 05/16/28 Yes [name], NP  budesonide-formoterol (SYMBICORT) 160-4.5 MCG/ACT inhaler Inhale 2 puffs into the lungs 2 (two) times daily. 04/24/17  Yes [name], NP  hydrochlorothiazide (HYDRODIURIL) 25 MG tablet Take 1 tablet (25 mg total) by mouth daily. 02/07/18  Yes [name], NP  ibuprofen (ADVIL) 600 MG tablet  TAKE 1 TABLET (600 MG TOTAL) BY MOUTH EVERY 8 (EIGHT) HOURS AS NEEDED. Patient taking differently: Take 600 mg by mouth every 8 (eight) hours as needed for mild pain.  02/23/19  Yes [name], NP  lisinopril (PRINIVIL,ZESTRIL) 40 MG tablet TAKE 1 TABLET (40 MG TOTAL) BY MOUTH DAILY. 03/28/18  Yes [name], MD  acetaminophen-codeine (TYLENOL #3) 300-30 MG tablet Take 1 tablet by mouth every 8 (eight) hours as needed for moderate pain or severe pain. Patient not taking: Reported on 05/17/2019 02/06/19   [name], NP  cyclobenzaprine (FLEXERIL) 10 MG tablet Take 1 tablet (10 mg total) by mouth 3 (three) times daily as needed for muscle spasms. Patient not taking: Reported on 05/17/2019 10/01/18   [name], NP    Allergies    Patient has no known allergies.  Review of Systems   Review of Systems  Constitutional:  Positive for fatigue. Negative for chills and fever.  HENT: Negative for congestion.   Eyes: Negative for visual disturbance.  Respiratory: Positive for cough, chest tightness and shortness of breath. Negative for sputum production and wheezing.   Cardiovascular: Positive for leg swelling. Negative for chest pain and palpitations.  Gastrointestinal: Negative for abdominal pain, constipation, diarrhea, nausea and vomiting.  Genitourinary: Negative for dysuria and frequency.  Musculoskeletal: Negative for back pain, neck pain and neck stiffness.  Skin: Negative for rash and wound.  Neurological: Negative for dizziness, numbness and headaches.  Psychiatric/Behavioral: Negative for agitation.  All other systems reviewed and are negative.   Physical Exam Updated Vital Signs BP (!) 141/104   Pulse (!) 107   Temp 98.6 F (37 C) (Oral)   Resp 17   Ht 5\' 4"  (1.626 m)   Wt 115.2 kg   SpO2 100%   BMI 43.60 kg/m   Physical Exam Vitals and nursing note reviewed.  Constitutional:      General: She is in acute distress.     Appearance: She is well-developed. She is ill-appearing. She is not toxic-appearing or diaphoretic.  HENT:     Head: Normocephalic and atraumatic.     Right Ear: External ear normal.     Left Ear: External ear normal.     Nose: Nose normal. No congestion or rhinorrhea.     Mouth/Throat:     Mouth: Mucous membranes are moist.     Pharynx: No oropharyngeal exudate or posterior oropharyngeal erythema.  Eyes:     Conjunctiva/sclera: Conjunctivae normal.     Pupils: Pupils are equal, round, and reactive to light.  Cardiovascular:     Rate and Rhythm: Regular rhythm. Tachycardia present.     Pulses: Normal pulses.     Heart sounds: No murmur.  Pulmonary:     Effort: Respiratory distress present.     Breath sounds: No stridor. Wheezing, rhonchi and rales present.  Chest:     Chest wall: No tenderness.  Abdominal:     General: Abdomen is flat. There is no distension.      Tenderness: There is no abdominal tenderness. There is no right CVA tenderness, left CVA tenderness or rebound.  Musculoskeletal:        General: No tenderness.     Cervical back: Normal range of motion and neck supple. No tenderness.     Right lower leg: Edema present.     Left lower leg: Edema present.  Skin:    General: Skin is warm.     Findings: No erythema or rash.  Neurological:  General: No focal deficit present.     Mental Status: She is alert and oriented to person, place, and time.     Motor: No abnormal muscle tone.     Coordination: Coordination normal.     Deep Tendon Reflexes: Reflexes are normal and symmetric.  Psychiatric:        Mood and Affect: Mood normal.     ED Results / Procedures / Treatments   Labs (all labs ordered are listed, but only abnormal results are displayed) Labs Reviewed  CBC WITH DIFFERENTIAL/PLATELET - Abnormal; Notable for the following components:      Result Value   WBC 19.4 (*)    Hemoglobin 9.9 (*)    HCT 33.8 (*)    MCV 67.2 (*)    MCH 19.7 (*)    MCHC 29.3 (*)    RDW 17.9 (*)    Platelets 512 (*)    Neutro Abs 15.3 (*)    Monocytes Absolute 1.1 (*)    Abs Immature Granulocytes 0.09 (*)    All other components within normal limits  COMPREHENSIVE METABOLIC PANEL - Abnormal; Notable for the following components:   Glucose, Bld 138 (*)    Calcium 8.5 (*)    AST 52 (*)    ALT 47 (*)    All other components within normal limits  BRAIN NATRIURETIC PEPTIDE - Abnormal; Notable for the following components:   B Natriuretic Peptide 243.1 (*)    All other components within normal limits  D-DIMER, QUANTITATIVE (NOT AT [facility]) - Abnormal; Notable for the following components:   D-Dimer, Quant 0.75 (*)    All other components within normal limits  POCT I-STAT 7, (LYTES, BLD GAS, ICA,H+H) - Abnormal; Notable for the following components:   pH, Arterial 7.342 (*)    pCO2 arterial 62.4 (*)    Bicarbonate 33.8 (*)    TCO2 36 (*)     Acid-Base Excess 7.0 (*)    Potassium 3.4 (*)    HCT 30.0 (*)    Hemoglobin 10.2 (*)    All other components within normal limits  TROPONIN I (HIGH SENSITIVITY) - Abnormal; Notable for the following components:   Troponin I (High Sensitivity) 58 (*)    All other components within normal limits  TROPONIN I (HIGH SENSITIVITY) - Abnormal; Notable for the following components:   Troponin I (High Sensitivity) 76 (*)    All other components within normal limits  RESPIRATORY PANEL BY RT PCR (FLU A&B, COVID)  URINE CULTURE  CULTURE, BLOOD (ROUTINE X 2)  CULTURE, BLOOD (ROUTINE X 2)  CULTURE, RESPIRATORY  LIPASE, BLOOD  LACTIC ACID, PLASMA  PROTIME-INR  URINALYSIS, ROUTINE W REFLEX MICROSCOPIC  BLOOD GAS, ARTERIAL  STREP PNEUMONIAE URINARY ANTIGEN  I-STAT BETA HCG BLOOD, ED ([facility], [facility], [facility] ONLY)    EKG EKG Interpretation  Date/Time:  Sunday May 17 2019 15:44:14 EDT Ventricular Rate:  126 PR Interval:    QRS Duration: 76 QT Interval:  317 QTC Calculation: 459 R Axis:   72 Text Interpretation: Sinus tachycardia Atrial premature complex LAE, consider biatrial enlargement Probable left ventricular hypertrophy When compared to prior, faster rate. No STEMI Confirmed by Antony Blackbird [phone]) on 05/17/2019 3:50:16 PM   Radiology CT Angio Chest PE W and/or Wo Contrast  Result Date: 05/17/2019 CLINICAL DATA:  Sarcoidosis, asthma.  Positive D-dimer. EXAM: CT ANGIOGRAPHY CHEST WITH CONTRAST TECHNIQUE: Multidetector CT imaging of the chest was performed using the standard protocol during bolus administration of intravenous contrast. Multiplanar CT image  reconstructions and MIPs were obtained to evaluate the vascular anatomy. CONTRAST:  63mL OMNIPAQUE IOHEXOL 350 MG/ML SOLN COMPARISON:  06/10/2007 FINDINGS: Cardiovascular: No filling defects in the pulmonary arteries to suggest pulmonary emboli. Heart is normal size. Aorta is normal caliber. Mediastinum/Nodes: No mediastinal, hilar, or axillary  adenopathy. Trachea and esophagus are unremarkable. Thyroid unremarkable. Lungs/Pleura: Large bulla in the upper lobes bilaterally. Scarring in the left lower lobe. Ground-glass airspace opacity in the left lower lobe at the left lung base. No effusions. Upper Abdomen: Imaging into the upper abdomen shows no acute findings. Musculoskeletal: Chest wall soft tissues are unremarkable. No acute bony abnormality. Review of the MIP images confirms the above findings. 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 Ground-glass opacity posterolaterally at the left lung base could reflect early infiltrate/pneumonia. No evidence of pulmonary embolus. Electronically Signed   By: Rolm Baptise M.D.   On: 05/17/2019 20:21   DG Chest Portable 1 View  Result Date: 05/17/2019 CLINICAL DATA:  Respiratory distress, cough. EXAM: PORTABLE CHEST 1 VIEW COMPARISON:  November 08, 2015. FINDINGS: The heart size and mediastinal contours are within normal limits. No pneumothorax or pleural effusion is noted. Bleb is noted in right upper lobe. Lungs are otherwise clear given overlying soft tissue artifact. The visualized skeletal structures are unremarkable. IMPRESSION: No active disease. Electronically Signed   By: Marijo Conception M.D.   On: 05/17/2019 16:37    Procedures Procedures (including critical care time)  CRITICAL CARE Performed by: Gwenyth Allegra Mieshia Pepitone Total critical care time: 45 minutes Critical care time was exclusive of separately billable procedures and treating other patients. Critical care was necessary to treat or prevent imminent or life-threatening deterioration. Critical care was time spent personally by me on the following activities: development of treatment plan with patient and/or surrogate as well as nursing, discussions with consultants, evaluation of patient's response to treatment, examination of patient, obtaining history from patient or surrogate, ordering and performing treatments  and interventions, ordering and review of laboratory studies, ordering and review of radiographic studies, pulse oximetry and re-evaluation of patient's condition.   Medications Ordered in ED Medications  nitroGLYCERIN 50 mg in dextrose 5 % 250 mL (0.2 mg/mL) infusion (85 mcg/min Intravenous Rate/Dose Change 05/17/19 1915)  ipratropium-albuterol (DUONEB) 0.5-2.5 (3) MG/3ML nebulizer solution 3 mL (0 mLs Nebulization Hold 05/17/19 2214)  furosemide (LASIX) injection 40 mg (40 mg Intravenous Given 05/17/19 2217)  methylPREDNISolone sodium succinate (SOLU-MEDROL) 40 mg/mL injection 40 mg (has no administration in time range)  albuterol (VENTOLIN HFA) 108 (90 Base) MCG/ACT inhaler 2 puff (2 puffs Inhalation Given 05/17/19 1555)  ipratropium (ATROVENT HFA) inhaler 2 puff (2 puffs Inhalation Given 05/17/19 1556)  iohexol (OMNIPAQUE) 350 MG/ML injection 75 mL (75 mLs Intravenous Contrast Given 05/17/19 2002)  cefTRIAXone (ROCEPHIN) 1 g in sodium chloride 0.9 % 100 mL IVPB (0 g Intravenous Stopped 05/17/19 2141)  azithromycin (ZITHROMAX) 500 mg in sodium chloride 0.9 % 250 mL IVPB (0 mg Intravenous Stopped 05/17/19 2250)    ED Course  I have reviewed the triage vital signs and the nursing notes.  Pertinent labs & imaging results that were available during my care of the patient were reviewed by me and considered in my medical decision making (see chart for details).    MDM Rules/Calculators/A&P                      Teresa Mendez is a 44 y.o. female with a past medical history significant for  hypertension, asthma, COPD, and sarcoidosis who presents with respiratory failure and shortness of breath.  Patient reports that she has had 2 to 3 days of shortness of breath and some cough.  She reports she has needed inhalers at home without significant relief.  She was found to have hypoxia in the 80s with EMS and required 15 L nonrebreather to increase her oxygen saturations.  She had significant wheezing and  decreased breath sounds prompting them to give Solu-Medrol and magnesium.  Patient is also tachycardic.  She is not complaining of chest pain but cannot breathe.  She reports no recent coronavirus exposures and has had a dry cough.  She reports some edema that has worsened and her blood pressure was found to be in the [id number] systolic on arrival.  She denies history of heart failure.  She denies any trauma.  She reports no abdominal pain or other complaints.  On exam, patient is in respiratory distress.  She is tachypneic and tachycardic.  She is on a nonrebreather.  We tried to de-escalate her oxygen supplement but she did not tolerate this.  She had wheezing, rhonchi, and rales on exam.  Chest and abdomen nontender.  Good pulses in extremities patient does have edema in her legs.  EKG showed sinus tachycardia with no STEMI.  Clinically I am concerned about several things including flash pulmonary edema with hypertensive emergency due to the elevated blood pressure in the rales on exam and leg edema.  Patient started on nitro drip.  She was given breathing treatments without significant relief.  Given the tachycardia, tachypnea, and hypoxia, a D-dimer was added.  Chest x-ray did not show pneumonia but patient started tiring out.  Patient was started on BiPAP as I was concerned she would need to be intubated if we did not start BiPAP.  Patient's Covid test was negative.  Patient improved with BiPAP and blood pressure management on the nitro drip.  Patient received a CT scan to rule out pulmonary embolism and it showed pneumonia instead of blood clot.  Patient started on antibiotics and due to her nitro drip and BiPAP requirement, critical care was called.  Critical care did not feel she needs admission to the ICU but was appropriate for stepdown given the rapid improvement.  Hospitalist team called and will admit patient for further management of her respiratory failure from multiple etiologies.   Final  Clinical Impression(s) / ED Diagnoses Final diagnoses:  Acute respiratory failure with hypoxia ([place])  Community acquired pneumonia, unspecified laterality  Hypervolemia, unspecified hypervolemia type  Elevated brain natriuretic peptide (BNP) level  Wheezing  COPD exacerbation ([facility])    Clinical Impression: 1. Acute respiratory failure with hypoxia ([place])   2. Community acquired pneumonia, unspecified laterality   3. Hypervolemia, unspecified hypervolemia type   4. Elevated brain natriuretic peptide (BNP) level   5. Wheezing   6. COPD exacerbation ([place])     Disposition: Admit  This note was prepared with assistance of Dragon voice recognition software. Occasional wrong-word or sound-a-like substitutions may have occurred due to the inherent limitations of voice recognition software.       Primus Gritton, Gwenyth Allegra, MD 05/18/19 [id number]

## 2019-05-18 NOTE — ED Notes (Signed)
Lunch Tray Ordered @ 1111. 

## 2019-05-18 NOTE — Progress Notes (Signed)
Teresa Mendez is a 44 y.o. female patient admitted from ED awake, alert - oriented  X 4 - no acute distress noted.  VSS - Blood pressure (!) 151/91, pulse (!) 112, temperature 98.6 F (37 C), temperature source Oral, resp. rate (!) 26, height 5\' 4"  (1.626 m), weight 115.2 kg, SpO2 100 %.    IV in place, occlusive dsg intact without redness.  Call light within reach, patient able to voice, and demonstrate understanding.  Skin, clean-dry- intact without evidence of bruising, or skin tears.   No evidence of skin break down noted on exam.    Will cont to eval and treat per MD orders.  Jeanella Craze, RN 05/18/2019 6:33 PM

## 2019-05-18 NOTE — H&P (Signed)
History and Physical    Teresa Mendez [id number] DOB: [birth year] DOA: 05/17/2019  PCP: [name], NP  Patient coming from: Home.  Chief Complaint: Shortness of breath.  HPI: Teresa Mendez is a 44 y.o. female with history of hypertension asthma sarcoidosis presents to the ER because of worsening shortness of breath ongoing for last 1 week which started off after patient attended funeral.  Patient had some sinus discharge which gradually worsened to cough and shortness of breath.  Patient shortness of with acutely worsened yesterday was brought to the ER.  Has not had any chest pain until patient reached the ER which lasted a few minutes and resolved without any intervention.  Denies any nausea vomiting or diarrhea.  Denies any fever chills.  ED Course: In the ER patient was requiring BiPAP to maintain sats.  CT angiogram of the chest was negative for pulmonary embolism but does show bilateral groundglass opacities.  Covid test was negative.  Blood pressure was more than [id number] systolic by [id number] diastolic for which patient was started on nitroglycerin infusion Lasix was given.  Patient also started on empiric antibiotics and nebulizer.  Initially critical care was consulted since patient symptoms was improving patient is admitted to stepdown.  Labs reveal AST 52 ALT is 47 BNP 241 high sensitive troponin was 58 and 76.  Sinus tachycardia nonspecific ST changes.  WBC count was 19.4 hemoglobin 9.9 platelets 512.  Patient admitted for acute respiratory failure hypoxia likely combination of possible prolapse pulmonary edema pneumonia and asthma.  Review of Systems: As per HPI, rest all negative.   Past Medical History:  Diagnosis Date  . Asthma   . Hypertension   . Sarcoidosis   . Sickle cell trait [facility])     Past Surgical History:  Procedure Laterality Date  . TUBAL LIGATION       reports that she has quit smoking. Her smoking use included cigarettes. She smoked 0.50 packs per day. She  has never used smokeless tobacco. She reports current alcohol use. She reports that she does not use drugs.  No Known Allergies  Family History  Problem Relation Age of Onset  . Hypertension Mother   . Congestive Heart Failure Mother   . Kidney failure Mother   . Sickle cell trait Mother   . Hypertension Father   . Congestive Heart Failure Father   . Kidney failure Father   . Glaucoma Father     Prior to Admission medications   Medication Sig Start Date End Date Taking? Authorizing Provider  albuterol (PROVENTIL) (2.5 MG/3ML) 0.083% nebulizer solution TAKE 3 MLS (2.5 MG TOTAL) BY NEBULIZATION EVERY 6 (SIX) HOURS AS NEEDED FOR WHEEZING OR SHORTNESS OF BREATH. 03/17/19  Yes Newlin, Enobong, MD  albuterol (VENTOLIN HFA) 108 (90 Base) MCG/ACT inhaler INHALE 1-2 PUFFS INTO THE LUNGS EVERY 6 (SIX) HOURS AS NEEDED FOR WHEEZING OR SHORTNESS OF BREATH (COUGH). 05/11/19  Yes [name], MD  amLODipine (NORVASC) 10 MG tablet Take 1 tablet (10 mg total) by mouth daily. 10/01/18 05/16/28 Yes [name], NP  budesonide-formoterol (SYMBICORT) 160-4.5 MCG/ACT inhaler Inhale 2 puffs into the lungs 2 (two) times daily. 04/24/17  Yes [name], NP  hydrochlorothiazide (HYDRODIURIL) 25 MG tablet Take 1 tablet (25 mg total) by mouth daily. 02/07/18  Yes [name], NP  ibuprofen (ADVIL) 600 MG tablet TAKE 1 TABLET (600 MG TOTAL) BY MOUTH EVERY 8 (EIGHT) HOURS AS NEEDED. Patient taking differently: Take 600 mg by mouth  every 8 (eight) hours as needed for mild pain.  02/23/19  Yes [name], NP  lisinopril (PRINIVIL,ZESTRIL) 40 MG tablet TAKE 1 TABLET (40 MG TOTAL) BY MOUTH DAILY. 03/28/18  Yes [name], MD  acetaminophen-codeine (TYLENOL #3) 300-30 MG tablet Take 1 tablet by mouth every 8 (eight) hours as needed for moderate pain or severe pain. Patient not taking: Reported on 05/17/2019 02/06/19   [name], NP  cyclobenzaprine (FLEXERIL) 10 MG tablet Take 1 tablet (10 mg  total) by mouth 3 (three) times daily as needed for muscle spasms. Patient not taking: Reported on 05/17/2019 10/01/18   [name], NP    Physical Exam: Constitutional: Moderately built and nourished. Vitals:   05/18/19 0030 05/18/19 0100 05/18/19 0130 05/18/19 0200  BP: (!) 131/105 (!) 142/100 (!) 136/100 (!) 141/91  Pulse: (!) 113 (!) 105 (!) 105 (!) 107  Resp:  19 18 18   Temp:      TempSrc:      SpO2: 99% 98% 100% 93%  Weight:      Height:       Eyes: Anicteric no pallor. ENMT: No discharge from the ears eyes nose or mouth. Neck: No masses.  No neck rigidity.  JVD not appreciated. Respiratory: Mild wheezing no crepitations. Cardiovascular: S1-S2 heard. Abdomen: Soft nontender bowel sounds present. Musculoskeletal: No edema. Skin: No rash. Neurologic: Alert awake oriented time place and person.  Moves all extremities. Psychiatric: Appears normal but normal affect.   Labs on Admission: I have personally reviewed following labs and imaging studies  CBC: Recent Labs  Lab 05/17/19 1608 05/17/19 2138  WBC 19.4*  --   NEUTROABS 15.3*  --   HGB 9.9* 10.2*  HCT 33.8* 30.0*  MCV 67.2*  --   PLT 512*  --    Basic Metabolic Panel: Recent Labs  Lab 05/17/19 1608 05/17/19 2138  NA 140 139  K 3.7 3.4*  CL 98  --   CO2 30  --   GLUCOSE 138*  --   BUN 9  --   CREATININE 0.87  --   CALCIUM 8.5*  --    GFR: Estimated Creatinine Clearance: 100.6 mL/min (by C-G formula based on SCr of 0.87 mg/dL). Liver Function Tests: Recent Labs  Lab 05/17/19 1608  AST 52*  ALT 47*  ALKPHOS 69  BILITOT 0.7  PROT 7.3  ALBUMIN 3.7   Recent Labs  Lab 05/17/19 1608  LIPASE 15   No results for input(s): AMMONIA in the last 168 hours. Coagulation Profile: Recent Labs  Lab 05/17/19 1608  INR 1.0   Cardiac Enzymes: No results for input(s): CKTOTAL, CKMB, CKMBINDEX, TROPONINI in the last 168 hours. BNP (last 3 results) No results for input(s): PROBNP in the last 8760  hours. HbA1C: No results for input(s): HGBA1C in the last 72 hours. CBG: No results for input(s): GLUCAP in the last 168 hours. Lipid Profile: No results for input(s): CHOL, HDL, LDLCALC, TRIG, CHOLHDL, LDLDIRECT in the last 72 hours. Thyroid Function Tests: No results for input(s): TSH, T4TOTAL, FREET4, T3FREE, THYROIDAB in the last 72 hours. Anemia Panel: No results for input(s): VITAMINB12, FOLATE, FERRITIN, TIBC, IRON, RETICCTPCT in the last 72 hours. Urine analysis:    Component Value Date/Time   COLORURINE STRAW (A) 05/18/2019 0021   APPEARANCEUR CLEAR 05/18/2019 0021   LABSPEC 1.016 05/18/2019 0021   PHURINE 6.0 05/18/2019 0021   GLUCOSEU NEGATIVE 05/18/2019 0021   HGBUR NEGATIVE 05/18/2019 0021   BILIRUBINUR NEGATIVE 05/18/2019  0021   BILIRUBINUR Negative 04/24/2017 1032   KETONESUR NEGATIVE 05/18/2019 0021   PROTEINUR 30 (A) 05/18/2019 0021   UROBILINOGEN 0.2 04/24/2017 1032   UROBILINOGEN 1.0 08/18/2015 1144   NITRITE NEGATIVE 05/18/2019 0021   LEUKOCYTESUR SMALL (A) 05/18/2019 0021   Sepsis Labs: @LABRCNTIP (procalcitonin:4,lacticidven:4) ) Recent Results (from the past 240 hour(s))  Respiratory Panel by RT PCR (Flu A&B, Covid) - Nasopharyngeal Swab     Status: None   Collection Time: 05/17/19  3:48 PM   Specimen: Nasopharyngeal Swab  Result Value Ref Range Status   SARS Coronavirus 2 by RT PCR NEGATIVE NEGATIVE Final    Comment: (NOTE) SARS-CoV-2 target nucleic acids are NOT DETECTED. The SARS-CoV-2 RNA is generally detectable in upper respiratoy specimens during the acute phase of infection. The lowest concentration of SARS-CoV-2 viral copies this assay can detect is 131 copies/mL. A negative result does not preclude SARS-Cov-2 infection and should not be used as the sole basis for treatment or other patient management decisions. A negative result may occur with  improper specimen collection/handling, submission of specimen other than nasopharyngeal swab,  presence of viral mutation(s) within the areas targeted by this assay, and inadequate number of viral copies (<131 copies/mL). A negative result must be combined with clinical observations, patient history, and epidemiological information. The expected result is Negative. Fact Sheet for Patients:  PinkCheek.be Fact Sheet for Healthcare Providers:  GravelBags.it This test is not yet ap proved or cleared by the [place] FDA and  has been authorized for detection and/or diagnosis of SARS-CoV-2 by FDA under an Emergency Use Authorization (EUA). This EUA will remain  in effect (meaning this test can be used) for the duration of the COVID-19 declaration under Section 564(b)(1) of the Act, 21 U.S.C. section 360bbb-3(b)(1), unless the authorization is terminated or revoked sooner.    Influenza A by PCR NEGATIVE NEGATIVE Final   Influenza B by PCR NEGATIVE NEGATIVE Final    Comment: (NOTE) The Xpert Xpress SARS-CoV-2/FLU/RSV assay is intended as an aid in  the diagnosis of influenza from Nasopharyngeal swab specimens and  should not be used as a sole basis for treatment. Nasal washings and  aspirates are unacceptable for Xpert Xpress SARS-CoV-2/FLU/RSV  testing. Fact Sheet for Patients: PinkCheek.be Fact Sheet for Healthcare Providers: GravelBags.it This test is not yet approved or cleared by the [place] FDA and  has been authorized for detection and/or diagnosis of SARS-CoV-2 by  FDA under an Emergency Use Authorization (EUA). This EUA will remain  in effect (meaning this test can be used) for the duration of the  Covid-19 declaration under Section 564(b)(1) of the Act, 21  U.S.C. section 360bbb-3(b)(1), unless the authorization is  terminated or revoked. Performed at [facility], [place] [address], [place], [place] [address]      Radiological Exams on  Admission: CT Angio Chest PE W and/or Wo Contrast  Result Date: 05/17/2019 CLINICAL DATA:  Sarcoidosis, asthma.  Positive D-dimer. EXAM: CT ANGIOGRAPHY CHEST WITH CONTRAST TECHNIQUE: Multidetector CT imaging of the chest was performed using the standard protocol during bolus administration of intravenous contrast. Multiplanar CT image reconstructions and MIPs were obtained to evaluate the vascular anatomy. CONTRAST:  88mL OMNIPAQUE IOHEXOL 350 MG/ML SOLN COMPARISON:  06/10/2007 FINDINGS: Cardiovascular: No filling defects in the pulmonary arteries to suggest pulmonary emboli. Heart is normal size. Aorta is normal caliber. Mediastinum/Nodes: No mediastinal, hilar, or axillary adenopathy. Trachea and esophagus are unremarkable. Thyroid unremarkable. Lungs/Pleura: Large bulla in the upper lobes bilaterally.  Scarring in the left lower lobe. Ground-glass airspace opacity in the left lower lobe at the left lung base. No effusions. Upper Abdomen: Imaging into the upper abdomen shows no acute findings. Musculoskeletal: Chest wall soft tissues are unremarkable. No acute bony abnormality. Review of the MIP images confirms the above findings. 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 Ground-glass opacity posterolaterally at the left lung base could reflect early infiltrate/pneumonia. No evidence of pulmonary embolus. Electronically Signed   By: Rolm Baptise M.D.   On: 05/17/2019 20:21   DG Chest Portable 1 View  Result Date: 05/17/2019 CLINICAL DATA:  Respiratory distress, cough. EXAM: PORTABLE CHEST 1 VIEW COMPARISON:  November 08, 2015. FINDINGS: The heart size and mediastinal contours are within normal limits. No pneumothorax or pleural effusion is noted. Bleb is noted in right upper lobe. Lungs are otherwise clear given overlying soft tissue artifact. The visualized skeletal structures are unremarkable. IMPRESSION: No active disease. Electronically Signed   By: Marijo Conception M.D.   On:  05/17/2019 16:37    EKG: Independently reviewed.  Sinus tachycardia with nonspecific T changes.  Assessment/Plan Principal Problem:   Acute respiratory failure with hypoxia (HCC) Active Problems:   PULMONARY SARCOIDOSIS   Intrinsic asthma   Hypertensive urgency   Chest pain    1. Acute respiratory failure with hypoxia likely a combination of hypertensive urgency/flash from edema with possible pneumonia and asthma.  Patient symptoms are more likely from flash pemirolast/hypertensive urgency however patient has benign cough for last few days concerning for pneumonia.  Patient is on nitroglycerin infusion for the hypertensive urgency which can be weaned off after patient takes at home medications.  On empiric antibiotics follow cultures.  On nebulizers and Pulmicort and IV steroids.  Patient did receive 1 dose of Lasix IV 40 mg which can be further dose based on the response.  Follow intake output metabolic panel today just been ordered.  As advised respiratory to wean off the BiPAP as patient's breathing is improved. 2. Hypertensive urgency presently on nitroglycerin infusion.  Try to wean it off after patient takes at home dose of lisinopril hydrochlorothiazide and amlodipine. 3. Possible pneumonia on empiric antibiotics No. 1.  Covid test was negative.  Check procalcitonin. 4. Chest pain happened briefly in the ER.  Has improved.  Check 2D echo.  Cycle cardiac markers.  Presently chest pain-free.  Likely related to a hypertensive urgency.  Patient is on nitroglycerin infusion at this time.  Keep patient on baby aspirin. 5. Asthma exacerbation for which patient on nebulizer.  Wheezing is improved. 6. Anemia appears to be chronic follow CBC. 7. History of sarcoidosis.  Given the acute respiratory failure with hypertensive urgency will need close monitoring for any further worsening in inpatient status.   DVT prophylaxis: Lovenox. Code Status: Full code. Family Communication: Discussed with  patient. Disposition Plan: Home. Consults called: Pulmonary critical care. Admission status: Inpatient.   Rise Patience MD [facility] Pager [phone].  If 7PM-7AM, please contact night-coverage [email or web address] Password [email or web address]  05/18/2019, 2:15 AM

## 2019-05-18 NOTE — Progress Notes (Signed)
Gave report to [place] on [place].

## 2019-05-18 NOTE — ED Notes (Signed)
Breakfast ordered 

## 2019-05-19 ENCOUNTER — Other Ambulatory Visit: Payer: Self-pay | Admitting: Nurse Practitioner

## 2019-05-19 DIAGNOSIS — J9601 Acute respiratory failure with hypoxia: Secondary | ICD-10-CM | POA: Diagnosis not present

## 2019-05-19 DIAGNOSIS — S46911A Strain of unspecified muscle, fascia and tendon at shoulder and upper arm level, right arm, initial encounter: Secondary | ICD-10-CM

## 2019-05-19 DIAGNOSIS — R079 Chest pain, unspecified: Secondary | ICD-10-CM

## 2019-05-19 DIAGNOSIS — J9602 Acute respiratory failure with hypercapnia: Secondary | ICD-10-CM | POA: Diagnosis not present

## 2019-05-19 DIAGNOSIS — D869 Sarcoidosis, unspecified: Secondary | ICD-10-CM

## 2019-05-19 LAB — BASIC METABOLIC PANEL
Anion gap: 7 (ref 5–15)
BUN: 17 mg/dL (ref 6–20)
CO2: 36 mmol/L — ABNORMAL HIGH (ref 22–32)
Calcium: 8.7 mg/dL — ABNORMAL LOW (ref 8.9–10.3)
Chloride: 96 mmol/L — ABNORMAL LOW (ref 98–111)
Creatinine, Ser: 0.78 mg/dL (ref 0.44–1.00)
GFR calc Af Amer: >60 mL/min (ref >60)
GFR calc non Af Amer: >60 mL/min (ref >60)
Glucose, Bld: 109 mg/dL — ABNORMAL HIGH (ref 70–99)
Potassium: 3.3 mmol/L — ABNORMAL LOW (ref 3.5–5.1)
Sodium: 139 mmol/L (ref 135–145)

## 2019-05-19 LAB — CBC WITH DIFFERENTIAL/PLATELET
Abs Immature Granulocytes: 0.08 10*3/uL — ABNORMAL HIGH (ref 0.00–0.07)
Basophils Absolute: 0 10*3/uL (ref 0.0–0.1)
Basophils Relative: 0 %
Eosinophils Absolute: 0 10*3/uL (ref 0.0–0.5)
Eosinophils Relative: 0 %
HCT: 26.4 % — ABNORMAL LOW (ref 36.0–46.0)
Hemoglobin: 7.9 g/dL — ABNORMAL LOW (ref 12.0–15.0)
Immature Granulocytes: 0 %
Lymphocytes Relative: 19 %
Lymphs Abs: 3.6 10*3/uL (ref 0.7–4.0)
MCH: 19.8 pg — ABNORMAL LOW (ref 26.0–34.0)
MCHC: 29.9 g/dL — ABNORMAL LOW (ref 30.0–36.0)
MCV: 66 fL — ABNORMAL LOW (ref 80.0–100.0)
Monocytes Absolute: 1.7 10*3/uL — ABNORMAL HIGH (ref 0.1–1.0)
Monocytes Relative: 9 %
Neutro Abs: 13.9 10*3/uL — ABNORMAL HIGH (ref 1.7–7.7)
Neutrophils Relative %: 72 %
Platelets: 446 10*3/uL — ABNORMAL HIGH (ref 150–400)
RBC: 4 MIL/uL (ref 3.87–5.11)
RDW: 17.5 % — ABNORMAL HIGH (ref 11.5–15.5)
WBC: 19.4 10*3/uL — ABNORMAL HIGH (ref 4.0–10.5)
nRBC: 0 % (ref 0.0–0.2)

## 2019-05-19 LAB — URINE CULTURE

## 2019-05-19 LAB — PROCALCITONIN: Procalcitonin: <0.1 ng/mL

## 2019-05-19 LAB — TSH: TSH: 0.532 u[IU]/mL (ref 0.350–4.500)

## 2019-05-19 LAB — MRSA PCR SCREENING: MRSA by PCR: NEGATIVE

## 2019-05-19 LAB — MAGNESIUM: Magnesium: 2 mg/dL (ref 1.7–2.4)

## 2019-05-19 MED ORDER — LORATADINE 10 MG PO TABS
10.0000 mg | ORAL_TABLET | Freq: Every day | ORAL | Status: DC
  Administered 2019-05-19 – 2019-05-22 (×4): 10 mg via ORAL
  Filled 2019-05-19 (×4): qty 1

## 2019-05-19 MED ORDER — FERROUS SULFATE 325 (65 FE) MG PO TABS
325.0000 mg | ORAL_TABLET | Freq: Every day | ORAL | Status: DC
  Administered 2019-05-20 – 2019-05-22 (×3): 325 mg via ORAL
  Filled 2019-05-19 (×3): qty 1

## 2019-05-19 MED ORDER — POTASSIUM CHLORIDE CRYS ER 20 MEQ PO TBCR
40.0000 meq | EXTENDED_RELEASE_TABLET | Freq: Two times a day (BID) | ORAL | Status: AC
  Administered 2019-05-19 (×2): 40 meq via ORAL
  Filled 2019-05-19 (×2): qty 2

## 2019-05-19 MED ORDER — ACETAMINOPHEN 325 MG PO TABS
650.0000 mg | ORAL_TABLET | Freq: Four times a day (QID) | ORAL | Status: DC | PRN
  Administered 2019-05-19 – 2019-05-20 (×3): 650 mg via ORAL
  Filled 2019-05-19 (×3): qty 2

## 2019-05-19 MED ORDER — FLUTICASONE FUROATE-VILANTEROL 200-25 MCG/INH IN AEPB
1.0000 | INHALATION_SPRAY | Freq: Every day | RESPIRATORY_TRACT | Status: DC
  Administered 2019-05-19 – 2019-05-22 (×4): 1 via RESPIRATORY_TRACT
  Filled 2019-05-19: qty 28

## 2019-05-19 MED ORDER — PANTOPRAZOLE SODIUM 40 MG PO TBEC
40.0000 mg | DELAYED_RELEASE_TABLET | Freq: Every day | ORAL | Status: DC
  Administered 2019-05-19 – 2019-05-22 (×4): 40 mg via ORAL
  Filled 2019-05-19 (×3): qty 1

## 2019-05-19 MED ORDER — HYDRALAZINE HCL 20 MG/ML IJ SOLN
10.0000 mg | INTRAMUSCULAR | Status: DC | PRN
  Administered 2019-05-19 – 2019-05-20 (×3): 10 mg via INTRAVENOUS
  Filled 2019-05-19 (×4): qty 1

## 2019-05-19 MED ORDER — SODIUM CHLORIDE 0.9 % IV SOLN
510.0000 mg | Freq: Once | INTRAVENOUS | Status: AC
  Administered 2019-05-19: 10:00:00 510 mg via INTRAVENOUS
  Filled 2019-05-19: qty 17

## 2019-05-19 MED ORDER — PREDNISONE 20 MG PO TABS
40.0000 mg | ORAL_TABLET | Freq: Every day | ORAL | Status: DC
  Administered 2019-05-19: 10:00:00 40 mg via ORAL
  Filled 2019-05-19: qty 2

## 2019-05-19 NOTE — Progress Notes (Signed)
Pt with yellow MEWS score 2 to 3. Previous MEWS score red 4. MD aware of pt vitals. Will continue to monitor.

## 2019-05-19 NOTE — Progress Notes (Addendum)
NAME:  Teresa Mendez, MRN:  [id number], DOB:  01-29-1976, LOS: 0 ADMISSION DATE:  05/17/2019, CONSULTATION DATE:  05/17/19 REFERRING MD:  EDP, CHIEF COMPLAINT:  Hypoxic respiratory failure   Brief History   44 y.o. F with PMH of sarcoidosis and asthma who presented with respiratory distress, wheezing and hypoxia.  She was placed on 15L [place] then BiPAP and treated for asthma exacerbation.  PCCM asked to consult.  Past Medical History   has a past medical history of Asthma, Hypertension, Sarcoidosis, and Sickle cell trait ([place]).   Significant Hospital Events   05/17/19 presented to the ED in respiratory distress, PCCM consult, admit to stepdown 4/20:   Consults:  PCCM  Procedures:    Significant Diagnostic Tests:  05/17/19  CTA>> was emphysematous changes in the upper lobes, GGO left lung base, no PE 4/19 echo: EF 60-65%, RV nml, trivial AVR, no WMA Micro Data:  4/18 SARS-Covid-2 and influenza>> negative 4/18 U strep: neg   Antimicrobials:  Ceftriaxone 4/18- Azithromycin 4/18-  Interim history/subjective:  She feels better, shortness of breath improved.  Still having episodes of hypertension as well is a headache. Objective    Blood Pressure (Abnormal) 159/103 (BP Location: Left Arm)   Pulse (Abnormal) 111   Temperature 98.7 F (37.1 C) (Oral)   Respiration (Abnormal) 24   Height 5\' 4"  (1.626 m)   Weight 113.4 kg   Oxygen Saturation 97%   Body Mass Index 42.91 kg/m    Intake/Output Summary (Last 24 hours) at 05/19/2019 0911 Last data filed at 05/19/2019 0434 Gross per 24 hour  Intake 1243.05 ml  Output 400 ml  Net 843.05 ml   General: Pleasant 44 year old black female she is resting in bed currently in no acute distress  HEENT normocephalic, does have mild exophthalmia atraumatic no jugular venous distention appreciated, she does have faint upper airway expiratory wheeze appreciated on exam Pulmonary: Clear to auscultation without accessory use.  Currently on 3  L via nasal cannula Cardiac: Regular rate and rhythm Abdomen soft not tender no organomegaly Extremities are warm dry she does have chronic venous stasis changes and continues to have some degree of lower extremity edema Neuro awake oriented GU voids  Resolved Hospital Problem list   Chest pain  Assessment & Plan:   Acute hypoxic respiratory failure multi-factorial: asthmatic exacerbation, HTN crisis w/ pulm edema (doubt PNA) and  underlying Sarcoid. Also think component of LPR (laryngal pharyngeal reflux disease) as wheeze now predominantly upper airway, continues to have vocal hoarseness, and endorses postnasal drip previously.  This would also make sense in that he also seems to have more breathing trouble during times of emotional stress -No PE -no fever spike. WBC ct up but may be steroid related; Pct neg  -oxygen requirements improved from admit Plan Day 3 ctx and azith, we can discontinue this after 5 days Cont to wean oxygen (appears as though there is still room here) Day #3 of 5 steroids, will change to prednisone Add PPI Add antihistamine Cont BDs, she previously had been on Symbicort, has not been on this for over 3 months as needed prior authorization.  She needs to go home on both maintenance and rescue bronchodilator/inhaled corticosteroid regimen as well as rescue beta agonist Mobilize  We will place follow-up appointment in the computer (done) She is on Ace-I which can contribute to upper airway dz but for now will cont   HTN, chest pain, mildly elevated troponin -Improved with nitro gtt and CP  resolved, no acute ischemic changes in EKG Plan Cont norvasc, HctZ, lisinopril, and asa  Adding as needed hydralazine Checking TSH  Fluid and electrolyte imbalance: hypokalemia Plan Replace and recheck   Possible UTI Plan Rocephin   Microcytic Anemia w/ hgb drift  (down from 8.7 to 7.9 [spot checks reading higher]) -no evidence of bleeding Plan Trend  Transfuse  for hgb < 7 Cont feraheme  Erick Colace ACNP-BC [place] Pager # [phone] OR # [phone] if no answer

## 2019-05-19 NOTE — Plan of Care (Signed)
  Problem: Education: Goal: Knowledge of General Education information will improve Description: Including pain rating scale, medication(s)/side effects and non-pharmacologic comfort measures Outcome: Progressing   Problem: Health Behavior/Discharge Planning: Goal: Ability to manage health-related needs will improve Outcome: Progressing   Problem: Clinical Measurements: Goal: Ability to maintain clinical measurements within normal limits will improve Outcome: Progressing Goal: Will remain free from infection Outcome: Progressing Goal: Diagnostic test results will improve Outcome: Progressing Goal: Respiratory complications will improve Outcome: Progressing Goal: Cardiovascular complication will be avoided Outcome: Progressing   Problem: Activity: Goal: Risk for activity intolerance will decrease Outcome: Progressing   Problem: Nutrition: Goal: Adequate nutrition will be maintained Outcome: Progressing   Problem: Elimination: Goal: Will not experience complications related to bowel motility Outcome: Progressing Goal: Will not experience complications related to urinary retention Outcome: Progressing   Problem: Pain Managment: Goal: General experience of comfort will improve Outcome: Progressing   Problem: Skin Integrity: Goal: Risk for impaired skin integrity will decrease Outcome: Progressing

## 2019-05-19 NOTE — Progress Notes (Addendum)
PROGRESS NOTE  Teresa Mendez [id number] DOB: 1975-05-15 DOA: 05/17/2019 PCP: [name], NP  HPI/Recap of past 24 hours: HPI from Dr Maura Crandall is a 44 y.o. female with history of HTN, asthma, sarcoidosis presents to the ER because of worsening shortness of breath ongoing for last 1 week which started off after patient attended funeral.  Patient had some sinus discharge which gradually worsened to cough and shortness of breath.  Patient has not had any chest pain until patient reached the ER which lasted a few minutes and resolved without any intervention. In the ER patient was requiring BiPAP to maintain sats.  CT angiogram of the chest was negative for pulmonary embolism but does show bilateral groundglass opacities.  Covid test was negative.  Blood pressure was more than [id number] systolic by [id number] diastolic for which patient was started on nitroglycerin infusion. Lasix was given.  Patient also started on empiric antibiotics and nebulizer.  Initially critical care was consulted since patient symptoms was improving patient is admitted to stepdown.  Labs reveal AST 52 ALT is 47 BNP 241 high sensitive troponin was 58 and 76.  Sinus tachycardia nonspecific ST changes.  WBC count was 19.4 hemoglobin 9.9 platelets 512.  Patient admitted for acute respiratory failure hypoxia likely combination of possible pulmonary edema, pneumonia and asthma.     Today, patient still short of breath, significant diminished air entry bilaterally, BP still uncontrolled, denies any chest pain, abdominal pain, nausea/vomiting, fever/chills.   Assessment/Plan: Principal Problem:   Acute respiratory failure with hypoxia (HCC) Active Problems:   PULMONARY SARCOIDOSIS   Intrinsic asthma   Hypertensive urgency   Chest pain   Acute hypoxic and hypercarbic respiratory failure likely 2/2 hypertensive crisis/flash pulmonary edema Possible CAP Possible asthma exacerbation Initially requiring BiPAP,  currently on 3 L of O2 saturating above 95% Currently afebrile, with leukocytosis (on steroids) COVID-19 negative, flu negative ABG showed pH of 7.34, PCO2 62.4, PO2 98, repeat with some improvement in PCO2 Procalcitonin negative CTA chest showed 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, groundglass opacity could reflect early infiltrate/pneumonia, no evidence of PE PCCM consulted, appreciate recs Continue azithromycin, ceftriaxone S/P Solu-Medrol--> continue prednisone, DuoNeb, Pulmicort Supplemental oxygen as needed, BiPAP as needed/nightly Monitor closely in progressive unit  Hypertensive crisis Uncontrolled S/p nitroglycerin infusion Continue home lisinopril, hydrochlorothiazide, amlodipine  Hypokalemia Replete as needed  Atypical chest pain Currently no chest pain Likely 2/2 above Troponins with flat trend, no delta gap, EKG with no acute ST changes Echo showed EF of 60 to 65%, no regional wall motion abnormalities, mild left ventricular hypertrophy, left ventricular diastolic parameters were indeterminate Telemetry  Microcytic iron def anemia/thrombocytosis Hemoglobin downtrending Reports heavy periods, history of fibroids Anemia panel showed iron 12, TIBC 459, sats 38, ferritin 6, vitamin B12 530, folate 13.2 Status post 1 dose of Feraheme, continue ferrous sulfate Follow-up as an outpatient with OB/GYN Daily CBC  History of sarcoidosis Continue outpatient follow-up  Tobacco abuse Reports about a pack a day, has not smoked for the past 2 weeks Advised to quit  Morbid obesity Lifestyle modification advised       Malnutrition Type:      Malnutrition Characteristics:      Nutrition Interventions:       Estimated body mass index is 42.91 kg/m as calculated from the following:   Height as of this encounter: 5\' 4"  (1.626 m).   Weight as of this encounter: 113.4 kg.     Code Status: Full  Family Communication: Discussed extensively with  patient  Disposition Plan: Patient came from home, likely DC home in 24 to 48 hours once significantly improved.  Still requiring IV antibiotics, BP still uncontrolled   Consultants:  PCCM  Procedures:  None  Antimicrobials:  Ceftriaxone  Azithromycin  DVT prophylaxis: Lovenox   Objective: Vitals:   05/19/19 0740 05/19/19 0943 05/19/19 1131 05/19/19 1312  BP: (!) 159/103 (!) 139/93 (!) 170/114 (!) 164/110  Pulse: (!) 111  (!) 108   Resp: (!) 24  17   Temp: 98.7 F (37.1 C)  98.1 F (36.7 C)   TempSrc: Oral  Oral   SpO2: 97%  99%   Weight:      Height:        Intake/Output Summary (Last 24 hours) at 05/19/2019 1415 Last data filed at 05/19/2019 1300 Gross per 24 hour  Intake 1713.05 ml  Output 400 ml  Net 1313.05 ml   Filed Weights   05/17/19 1618 05/19/19 0426  Weight: 115.2 kg 113.4 kg    Exam:  General: NAD  Cardiovascular: S1, S2 present  Respiratory:  Diminished breath sounds bilaterally  Abdomen: Soft, nontender, nondistended, bowel sounds present  Musculoskeletal: Trace bilateral pedal edema noted  Skin: Normal  Psychiatry: Normal mood   Data Reviewed: CBC: Recent Labs  Lab 05/17/19 1608 05/17/19 2138 05/18/19 0213 05/18/19 1201 05/19/19 0323  WBC 19.4*  --  13.1*  --  19.4*  NEUTROABS 15.3*  --  11.5*  --  13.9*  HGB 9.9* 10.2* 8.7* 9.5* 7.9*  HCT 33.8* 30.0* 30.0* 28.0* 26.4*  MCV 67.2*  --  67.1*  --  66.0*  PLT 512*  --  449*  --  [id number]*   Basic Metabolic Panel: Recent Labs  Lab 05/17/19 1608 05/17/19 2138 05/18/19 0213 05/18/19 1201 05/19/19 0323  NA 140 139 138 141 139  K 3.7 3.4* 3.6 3.1* 3.3*  CL 98  --  95*  --  96*  CO2 30  --  31  --  36*  GLUCOSE 138*  --  165*  --  109*  BUN 9  --  10  --  17  CREATININE 0.87  --  0.94  --  0.78  CALCIUM 8.5*  --  8.4*  --  8.7*  MG  --   --   --   --  2.0   GFR: Estimated Creatinine Clearance: 108.5 mL/min (by C-G formula based on SCr of 0.78 mg/dL). Liver Function  Tests: Recent Labs  Lab 05/17/19 1608 05/18/19 0213  AST 52* 26  ALT 47* 39  ALKPHOS 69 56  BILITOT 0.7 0.7  PROT 7.3 6.6  ALBUMIN 3.7 3.2*   Recent Labs  Lab 05/17/19 1608  LIPASE 15   No results for input(s): AMMONIA in the last 168 hours. Coagulation Profile: Recent Labs  Lab 05/17/19 1608  INR 1.0   Cardiac Enzymes: No results for input(s): CKTOTAL, CKMB, CKMBINDEX, TROPONINI in the last 168 hours. BNP (last 3 results) No results for input(s): PROBNP in the last 8760 hours. HbA1C: No results for input(s): HGBA1C in the last 72 hours. CBG: No results for input(s): GLUCAP in the last 168 hours. Lipid Profile: No results for input(s): CHOL, HDL, LDLCALC, TRIG, CHOLHDL, LDLDIRECT in the last 72 hours. Thyroid Function Tests: Recent Labs    05/19/19 0323  TSH 0.532   Anemia Panel: Recent Labs    05/18/19 1237  VITAMINB12 530  FOLATE 13.2  FERRITIN 6*  TIBC  459*  IRON 12*   Urine analysis:    Component Value Date/Time   COLORURINE STRAW (A) 05/18/2019 0021   APPEARANCEUR CLEAR 05/18/2019 0021   LABSPEC 1.016 05/18/2019 0021   PHURINE 6.0 05/18/2019 0021   GLUCOSEU NEGATIVE 05/18/2019 0021   HGBUR NEGATIVE 05/18/2019 0021   BILIRUBINUR NEGATIVE 05/18/2019 0021   BILIRUBINUR Negative 04/24/2017 1032   KETONESUR NEGATIVE 05/18/2019 0021   PROTEINUR 30 (A) 05/18/2019 0021   UROBILINOGEN 0.2 04/24/2017 1032   UROBILINOGEN 1.0 08/18/2015 1144   NITRITE NEGATIVE 05/18/2019 0021   LEUKOCYTESUR SMALL (A) 05/18/2019 0021   Sepsis Labs: @LABRCNTIP (procalcitonin:4,lacticidven:4)  ) Recent Results (from the past 240 hour(s))  Respiratory Panel by RT PCR (Flu A&B, Covid) - Nasopharyngeal Swab     Status: None   Collection Time: 05/17/19  3:48 PM   Specimen: Nasopharyngeal Swab  Result Value Ref Range Status   SARS Coronavirus 2 by RT PCR NEGATIVE NEGATIVE Final    Comment: (NOTE) SARS-CoV-2 target nucleic acids are NOT DETECTED. The SARS-CoV-2 RNA is  generally detectable in upper respiratoy specimens during the acute phase of infection. The lowest concentration of SARS-CoV-2 viral copies this assay can detect is 131 copies/mL. A negative result does not preclude SARS-Cov-2 infection and should not be used as the sole basis for treatment or other patient management decisions. A negative result may occur with  improper specimen collection/handling, submission of specimen other than nasopharyngeal swab, presence of viral mutation(s) within the areas targeted by this assay, and inadequate number of viral copies (<131 copies/mL). A negative result must be combined with clinical observations, patient history, and epidemiological information. The expected result is Negative. Fact Sheet for Patients:  PinkCheek.be Fact Sheet for Healthcare Providers:  GravelBags.it This test is not yet ap proved or cleared by the [place] FDA and  has been authorized for detection and/or diagnosis of SARS-CoV-2 by FDA under an Emergency Use Authorization (EUA). This EUA will remain  in effect (meaning this test can be used) for the duration of the COVID-19 declaration under Section 564(b)(1) of the Act, 21 U.S.C. section 360bbb-3(b)(1), unless the authorization is terminated or revoked sooner.    Influenza A by PCR NEGATIVE NEGATIVE Final   Influenza B by PCR NEGATIVE NEGATIVE Final    Comment: (NOTE) The Xpert Xpress SARS-CoV-2/FLU/RSV assay is intended as an aid in  the diagnosis of influenza from Nasopharyngeal swab specimens and  should not be used as a sole basis for treatment. Nasal washings and  aspirates are unacceptable for Xpert Xpress SARS-CoV-2/FLU/RSV  testing. Fact Sheet for Patients: PinkCheek.be Fact Sheet for Healthcare Providers: GravelBags.it This test is not yet approved or cleared by the [place] FDA and   has been authorized for detection and/or diagnosis of SARS-CoV-2 by  FDA under an Emergency Use Authorization (EUA). This EUA will remain  in effect (meaning this test can be used) for the duration of the  Covid-19 declaration under Section 564(b)(1) of the Act, 21  U.S.C. section 360bbb-3(b)(1), unless the authorization is  terminated or revoked. Performed at [facility], [place] [address], [place], [place] [address]   Blood culture (routine x 2)     Status: None (Preliminary result)   Collection Time: 05/17/19  3:49 PM   Specimen: BLOOD  Result Value Ref Range Status   Specimen Description BLOOD RIGHT ANTECUBITAL  Final   Special Requests   Final    BOTTLES DRAWN AEROBIC AND ANAEROBIC Blood Culture results may not be optimal  due to an inadequate volume of blood received in culture bottles   Culture   Final    NO GROWTH 2 DAYS Performed at [facility], [place] [address] [place], [place] [address]    Report Status PENDING  Incomplete  Blood culture (routine x 2)     Status: None (Preliminary result)   Collection Time: 05/17/19  4:09 PM   Specimen: BLOOD RIGHT HAND  Result Value Ref Range Status   Specimen Description BLOOD RIGHT HAND  Final   Special Requests   Final    BOTTLES DRAWN AEROBIC AND ANAEROBIC Blood Culture adequate volume   Culture   Final    NO GROWTH 2 DAYS Performed at [facility], [place] [address], [place], [place] [address]    Report Status PENDING  Incomplete  Urine Culture     Status: Abnormal   Collection Time: 05/18/19 12:28 AM   Specimen: Urine, Random  Result Value Ref Range Status   Specimen Description URINE, RANDOM  Final   Special Requests   Final    NONE Performed at [facility], [place] [address], [place], [place] [address]    Culture MULTIPLE SPECIES PRESENT, SUGGEST RECOLLECTION (A)  Final   Report Status 05/19/2019 FINAL  Final  MRSA PCR Screening     Status: None   Collection Time: 05/18/19 11:43 PM   Specimen:  Nasopharyngeal  Result Value Ref Range Status   MRSA by PCR NEGATIVE NEGATIVE Final    Comment:        The GeneXpert MRSA Assay (FDA approved for NASAL specimens only), is one component of a comprehensive MRSA colonization surveillance program. It is not intended to diagnose MRSA infection nor to guide or monitor treatment for MRSA infections. Performed at [facility], [place] [address] [place], [place] [address]       Studies: No results found.  Scheduled Meds: . amLODipine  10 mg Oral Daily  . aspirin  81 mg Oral Daily  . enoxaparin (LOVENOX) injection  40 mg Subcutaneous Q24H  . fluticasone furoate-vilanterol  1 puff Inhalation Daily  . hydrochlorothiazide  25 mg Oral Daily  . lisinopril  40 mg Oral Daily  . loratadine  10 mg Oral Daily  . pantoprazole  40 mg Oral Q1200  . potassium chloride  40 mEq Oral BID  . predniSONE  40 mg Oral Q breakfast    Continuous Infusions: . azithromycin 500 mg (05/18/19 2123)  . cefTRIAXone (ROCEPHIN)  IV 2 g (05/18/19 2232)     LOS: 2 days     Alma Friendly, MD [facility]  If 7PM-7AM, please contact night-coverage [email or web address] 05/19/2019, 2:15 PM

## 2019-05-20 DIAGNOSIS — J441 Chronic obstructive pulmonary disease with (acute) exacerbation: Secondary | ICD-10-CM

## 2019-05-20 LAB — CBC WITH DIFFERENTIAL/PLATELET
Abs Immature Granulocytes: 0.08 10*3/uL — ABNORMAL HIGH (ref 0.00–0.07)
Basophils Absolute: 0 10*3/uL (ref 0.0–0.1)
Basophils Relative: 0 %
Eosinophils Absolute: 0.1 10*3/uL (ref 0.0–0.5)
Eosinophils Relative: 0 %
HCT: 31.3 % — ABNORMAL LOW (ref 36.0–46.0)
Hemoglobin: 9.3 g/dL — ABNORMAL LOW (ref 12.0–15.0)
Immature Granulocytes: 0 %
Lymphocytes Relative: 23 %
Lymphs Abs: 4.6 10*3/uL — ABNORMAL HIGH (ref 0.7–4.0)
MCH: 19.4 pg — ABNORMAL LOW (ref 26.0–34.0)
MCHC: 29.7 g/dL — ABNORMAL LOW (ref 30.0–36.0)
MCV: 65.3 fL — ABNORMAL LOW (ref 80.0–100.0)
Monocytes Absolute: 1.4 10*3/uL — ABNORMAL HIGH (ref 0.1–1.0)
Monocytes Relative: 7 %
Neutro Abs: 13.3 10*3/uL — ABNORMAL HIGH (ref 1.7–7.7)
Neutrophils Relative %: 70 %
Platelets: 480 10*3/uL — ABNORMAL HIGH (ref 150–400)
RBC: 4.79 MIL/uL (ref 3.87–5.11)
RDW: 17.8 % — ABNORMAL HIGH (ref 11.5–15.5)
WBC: 19.4 10*3/uL — ABNORMAL HIGH (ref 4.0–10.5)
nRBC: 0.1 % (ref 0.0–0.2)

## 2019-05-20 LAB — BASIC METABOLIC PANEL
Anion gap: 11 (ref 5–15)
BUN: 16 mg/dL (ref 6–20)
CO2: 32 mmol/L (ref 22–32)
Calcium: 9.1 mg/dL (ref 8.9–10.3)
Chloride: 97 mmol/L — ABNORMAL LOW (ref 98–111)
Creatinine, Ser: 0.73 mg/dL (ref 0.44–1.00)
GFR calc Af Amer: >60 mL/min (ref >60)
GFR calc non Af Amer: >60 mL/min (ref >60)
Glucose, Bld: 92 mg/dL (ref 70–99)
Potassium: 3.7 mmol/L (ref 3.5–5.1)
Sodium: 140 mmol/L (ref 135–145)

## 2019-05-20 MED ORDER — AZITHROMYCIN 500 MG PO TABS
500.0000 mg | ORAL_TABLET | Freq: Every day | ORAL | Status: DC
  Administered 2019-05-20 – 2019-05-21 (×2): 500 mg via ORAL
  Filled 2019-05-20 (×2): qty 1

## 2019-05-20 MED ORDER — PREDNISONE 20 MG PO TABS
40.0000 mg | ORAL_TABLET | Freq: Every day | ORAL | Status: AC
  Administered 2019-05-20 – 2019-05-21 (×2): 40 mg via ORAL
  Filled 2019-05-20 (×2): qty 2

## 2019-05-20 NOTE — Plan of Care (Signed)
  Problem: Education: Goal: Knowledge of General Education information will improve Description: Including pain rating scale, medication(s)/side effects and non-pharmacologic comfort measures Outcome: Progressing   Problem: Health Behavior/Discharge Planning: Goal: Ability to manage health-related needs will improve Outcome: Progressing   Problem: Clinical Measurements: Goal: Ability to maintain clinical measurements within normal limits will improve Outcome: Progressing Goal: Will remain free from infection Outcome: Progressing Goal: Diagnostic test results will improve Outcome: Progressing Goal: Respiratory complications will improve Outcome: Progressing Goal: Cardiovascular complication will be avoided Outcome: Progressing   Problem: Activity: Goal: Risk for activity intolerance will decrease Outcome: Progressing   Problem: Nutrition: Goal: Adequate nutrition will be maintained Outcome: Progressing   Problem: Elimination: Goal: Will not experience complications related to bowel motility Outcome: Progressing Goal: Will not experience complications related to urinary retention Outcome: Progressing   Problem: Pain Managment: Goal: General experience of comfort will improve Outcome: Progressing   Problem: Skin Integrity: Goal: Risk for impaired skin integrity will decrease Outcome: Progressing

## 2019-05-20 NOTE — Progress Notes (Signed)
PROGRESS NOTE    Teresa Mendez  [id number] DOB: January 24, 1976 DOA: 05/17/2019 PCP: [name], NP    Brief Narrative:  44 y.o.femalewithhistory of HTN, asthma, sarcoidosis presents to the ER because of worsening shortness of breath ongoing for last 1 week which started off after patient attended funeral. Patient had some sinus discharge which gradually worsened to cough and shortness of breath. Patient has not had any chest pain until patient reached the ER which lasted a few minutes and resolved without any intervention. In the ER patient was requiring BiPAP to maintain sats. CT angiogram of the chest was negative for pulmonary embolism but does show bilateral groundglass opacities. Covid test was negative. Blood pressure was more than [id number] systolic by [id number] diastolic for which patient was started on nitroglycerin infusion. Lasix was given. Patient also started on empiric antibiotics and nebulizer. Initially critical care was consulted since patient symptoms was improving patient is admitted to stepdown. Labs reveal AST 52 ALT is 47 BNP 241 high sensitive troponin was 58 and 76. Sinus tachycardia nonspecific ST changes. WBC count was 19.4 hemoglobin 9.9 platelets 512. Patient admitted for acute respiratory failure hypoxia likely combination of possible pulmonary edema, pneumonia and asthma.  Assessment & Plan:   Principal Problem:   Acute respiratory failure with hypoxia (HCC) Active Problems:   PULMONARY SARCOIDOSIS   Intrinsic asthma   Hypertensive urgency   Chest pain   Acute hypoxic and hypercarbic respiratory failure likely 2/2 hypertensive crisis/flash pulmonary edema Possible CAP Possible asthma exacerbation Initially requiring BiPAP, currently weaned to 1L of O2  Currently afebrile, with leukocytosis (on steroids) COVID-19 negative, flu negative Recent ABG showed pH of 7.34, PCO2 62.4, PO2 98, repeat with some improvement in PCO2 Procalcitonin negative CTA  chest noted 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, groundglass opacity could reflect early infiltrate/pneumonia, no evidence of PE PCCM had been following, recommended cont abx and to complete 5 days of abx Continue azithromycin, ceftriaxone S/P Solu-Medrol--> now on prednisone, DuoNeb, Pulmicort Supplemental oxygen as needed, BiPAP as needed/nightly Still with decreased BS on exam, visibly sob on exam  Hypertensive crisis Uncontrolled S/p nitroglycerin infusion Continued on home lisinopril, hydrochlorothiazide, amlodipine  Hypokalemia Normal this AM Repeat bmet in AM  Atypical chest pain Currently no chest pain Likely 2/2 above Troponins with flat trend, no delta gap, EKG with no acute ST changes Echo demonstrated EF of 60 to 65%, no regional wall motion abnormalities, mild left ventricular hypertrophy, left ventricular diastolic parameters were indeterminate Continue with tele  Microcytic iron def anemia/thrombocytosis Hemoglobin downtrending Reports heavy periods, history of fibroids Anemia panel showed iron 12, TIBC 459, sats 38, ferritin 6, vitamin B12 530, folate 13.2 Status post 1 dose of Feraheme, continue ferrous sulfate Follow-up as an outpatient with OB/GYN Repeat cbc in AM  History of sarcoidosis Continue outpatient follow-up  Tobacco abuse Reports about a pack a day, has not smoked for the past 2 weeks Cessation was done this visit  Morbid obesity Recommend diet/lifestyle modification   DVT prophylaxis: Lovenox subq Code Status: Full Family Communication: Pt in room, family not at bedside  Status is: Inpatient  Remains inpatient appropriate because:Inpatient level of care appropriate due to severity of illness   Dispo: The patient is from: Home              Anticipated d/c is to: Home              Anticipated d/c date is: 2 days  Patient currently is not medically stable to d/c.        Consultants:    Pulmonary  Procedures:     Antimicrobials: Anti-infectives (From admission, onward)   Start     Dose/Rate Route Frequency Ordered Stop   05/20/19 2200  azithromycin (ZITHROMAX) tablet 500 mg     500 mg Oral Daily at bedtime 05/20/19 0749 05/23/19 2159   05/18/19 2200  cefTRIAXone (ROCEPHIN) 2 g in sodium chloride 0.9 % 100 mL IVPB     2 g 200 mL/hr over 30 Minutes Intravenous Every 24 hours 05/18/19 0216 05/23/19 2159   05/18/19 2100  azithromycin (ZITHROMAX) 500 mg in sodium chloride 0.9 % 250 mL IVPB  Status:  Discontinued     500 mg 250 mL/hr over 60 Minutes Intravenous Every 24 hours 05/18/19 0216 05/20/19 0749   05/17/19 2030  cefTRIAXone (ROCEPHIN) 1 g in sodium chloride 0.9 % 100 mL IVPB     1 g 200 mL/hr over 30 Minutes Intravenous  Once 05/17/19 2024 05/17/19 2141   05/17/19 2030  azithromycin (ZITHROMAX) 500 mg in sodium chloride 0.9 % 250 mL IVPB     500 mg 250 mL/hr over 60 Minutes Intravenous  Once 05/17/19 2024 05/17/19 2250       Subjective: Still having sob this AM  Objective: Vitals:   05/20/19 0538 05/20/19 0620 05/20/19 0645 05/20/19 0841  BP:  (!) 164/111 (!) 166/101 (!) 153/102  Pulse:    (!) 110  Resp:    17  Temp:    98.3 F (36.8 C)  TempSrc:    Axillary  SpO2:    95%  Weight: 111.6 kg     Height:        Intake/Output Summary (Last 24 hours) at 05/20/2019 1316 Last data filed at 05/20/2019 0900 Gross per 24 hour  Intake 1011.69 ml  Output 3650 ml  Net -2638.31 ml   Filed Weights   05/17/19 1618 05/19/19 0426 05/20/19 0538  Weight: 115.2 kg 113.4 kg 111.6 kg    Examination:  General exam: Sitting up in bed, appears uncomfortable  Respiratory system: decreased BS in all fields, increased work of breathing Cardiovascular system: S1 & S2 heard, Regular Gastrointestinal system: Abdomen is nondistended, soft and nontender. No organomegaly or masses felt. Normal bowel sounds heard. Central nervous system: Alert and oriented. No focal  neurological deficits. Extremities: Symmetric 5 x 5 power. Skin: No rashes, lesions  Psychiatry: Judgement and insight appear normal. Mood & affect appropriate.   Data Reviewed: I have personally reviewed following labs and imaging studies  CBC: Recent Labs  Lab 05/17/19 1608 05/17/19 1608 05/17/19 2138 05/18/19 0213 05/18/19 1201 05/19/19 0323 05/20/19 0232  WBC 19.4*  --   --  13.1*  --  19.4* 19.4*  NEUTROABS 15.3*  --   --  11.5*  --  13.9* 13.3*  HGB 9.9*   < > 10.2* 8.7* 9.5* 7.9* 9.3*  HCT 33.8*   < > 30.0* 30.0* 28.0* 26.4* 31.3*  MCV 67.2*  --   --  67.1*  --  66.0* 65.3*  PLT 512*  --   --  449*  --  446* 480*   < > = values in this interval not displayed.   Basic Metabolic Panel: Recent Labs  Lab 05/17/19 1608 05/17/19 1608 05/17/19 2138 05/18/19 0213 05/18/19 1201 05/19/19 0323 05/20/19 0232  NA 140   < > 139 138 141 139 140  K 3.7   < > 3.4* 3.6  3.1* 3.3* 3.7  CL 98  --   --  95*  --  96* 97*  CO2 30  --   --  31  --  36* 32  GLUCOSE 138*  --   --  165*  --  109* 92  BUN 9  --   --  10  --  17 16  CREATININE 0.87  --   --  0.94  --  0.78 0.73  CALCIUM 8.5*  --   --  8.4*  --  8.7* 9.1  MG  --   --   --   --   --  2.0  --    < > = values in this interval not displayed.   GFR: Estimated Creatinine Clearance: 107.5 mL/min (by C-G formula based on SCr of 0.73 mg/dL). Liver Function Tests: Recent Labs  Lab 05/17/19 1608 05/18/19 0213  AST 52* 26  ALT 47* 39  ALKPHOS 69 56  BILITOT 0.7 0.7  PROT 7.3 6.6  ALBUMIN 3.7 3.2*   Recent Labs  Lab 05/17/19 1608  LIPASE 15   No results for input(s): AMMONIA in the last 168 hours. Coagulation Profile: Recent Labs  Lab 05/17/19 1608  INR 1.0   Cardiac Enzymes: No results for input(s): CKTOTAL, CKMB, CKMBINDEX, TROPONINI in the last 168 hours. BNP (last 3 results) No results for input(s): PROBNP in the last 8760 hours. HbA1C: No results for input(s): HGBA1C in the last 72 hours. CBG: No  results for input(s): GLUCAP in the last 168 hours. Lipid Profile: No results for input(s): CHOL, HDL, LDLCALC, TRIG, CHOLHDL, LDLDIRECT in the last 72 hours. Thyroid Function Tests: Recent Labs    05/19/19 0323  TSH 0.532   Anemia Panel: Recent Labs    05/18/19 1237  VITAMINB12 530  FOLATE 13.2  FERRITIN 6*  TIBC 459*  IRON 12*   Sepsis Labs: Recent Labs  Lab 05/17/19 1608 05/18/19 1237 05/19/19 0323  PROCALCITON  --  <0.10 <0.10  LATICACIDVEN 0.9  --   --     Recent Results (from the past 240 hour(s))  Respiratory Panel by RT PCR (Flu A&B, Covid) - Nasopharyngeal Swab     Status: None   Collection Time: 05/17/19  3:48 PM   Specimen: Nasopharyngeal Swab  Result Value Ref Range Status   SARS Coronavirus 2 by RT PCR NEGATIVE NEGATIVE Final    Comment: (NOTE) SARS-CoV-2 target nucleic acids are NOT DETECTED. The SARS-CoV-2 RNA is generally detectable in upper respiratoy specimens during the acute phase of infection. The lowest concentration of SARS-CoV-2 viral copies this assay can detect is 131 copies/mL. A negative result does not preclude SARS-Cov-2 infection and should not be used as the sole basis for treatment or other patient management decisions. A negative result may occur with  improper specimen collection/handling, submission of specimen other than nasopharyngeal swab, presence of viral mutation(s) within the areas targeted by this assay, and inadequate number of viral copies (<131 copies/mL). A negative result must be combined with clinical observations, patient history, and epidemiological information. The expected result is Negative. Fact Sheet for Patients:  PinkCheek.be Fact Sheet for Healthcare Providers:  GravelBags.it This test is not yet ap proved or cleared by the [place] FDA and  has been authorized for detection and/or diagnosis of SARS-CoV-2 by FDA under an Emergency Use  Authorization (EUA). This EUA will remain  in effect (meaning this test can be used) for the duration of the COVID-19 declaration under Section 564(b)(1)  of the Act, 21 U.S.C. section 360bbb-3(b)(1), unless the authorization is terminated or revoked sooner.    Influenza A by PCR NEGATIVE NEGATIVE Final   Influenza B by PCR NEGATIVE NEGATIVE Final    Comment: (NOTE) The Xpert Xpress SARS-CoV-2/FLU/RSV assay is intended as an aid in  the diagnosis of influenza from Nasopharyngeal swab specimens and  should not be used as a sole basis for treatment. Nasal washings and  aspirates are unacceptable for Xpert Xpress SARS-CoV-2/FLU/RSV  testing. Fact Sheet for Patients: PinkCheek.be Fact Sheet for Healthcare Providers: GravelBags.it This test is not yet approved or cleared by the [place] FDA and  has been authorized for detection and/or diagnosis of SARS-CoV-2 by  FDA under an Emergency Use Authorization (EUA). This EUA will remain  in effect (meaning this test can be used) for the duration of the  Covid-19 declaration under Section 564(b)(1) of the Act, 21  U.S.C. section 360bbb-3(b)(1), unless the authorization is  terminated or revoked. Performed at [facility], [place] [address], [place], [place] [address]   Blood culture (routine x 2)     Status: None (Preliminary result)   Collection Time: 05/17/19  3:49 PM   Specimen: BLOOD  Result Value Ref Range Status   Specimen Description BLOOD RIGHT ANTECUBITAL  Final   Special Requests   Final    BOTTLES DRAWN AEROBIC AND ANAEROBIC Blood Culture results may not be optimal due to an inadequate volume of blood received in culture bottles   Culture   Final    NO GROWTH 3 DAYS Performed at [facility], [place] [address] [place], [place] [address]    Report Status PENDING  Incomplete  Blood culture (routine x 2)     Status: None (Preliminary result)   Collection Time:  05/17/19  4:09 PM   Specimen: BLOOD RIGHT HAND  Result Value Ref Range Status   Specimen Description BLOOD RIGHT HAND  Final   Special Requests   Final    BOTTLES DRAWN AEROBIC AND ANAEROBIC Blood Culture adequate volume   Culture   Final    NO GROWTH 3 DAYS Performed at [facility], [place] [address], [place], [place] [address]    Report Status PENDING  Incomplete  Urine Culture     Status: Abnormal   Collection Time: 05/18/19 12:28 AM   Specimen: Urine, Random  Result Value Ref Range Status   Specimen Description URINE, RANDOM  Final   Special Requests   Final    NONE Performed at [facility], [place] [address] [place], [place] [address]    Culture MULTIPLE SPECIES PRESENT, SUGGEST RECOLLECTION (A)  Final   Report Status 05/19/2019 FINAL  Final  MRSA PCR Screening     Status: None   Collection Time: 05/18/19 11:43 PM   Specimen: Nasopharyngeal  Result Value Ref Range Status   MRSA by PCR NEGATIVE NEGATIVE Final    Comment:        The GeneXpert MRSA Assay (FDA approved for NASAL specimens only), is one component of a comprehensive MRSA colonization surveillance program. It is not intended to diagnose MRSA infection nor to guide or monitor treatment for MRSA infections. Performed at [facility], [place] [address], [place], [place] [address]      Radiology Studies: No results found.  Scheduled Meds: . amLODipine  10 mg Oral Daily  . aspirin  81 mg Oral Daily  . azithromycin  500 mg Oral QHS  .  enoxaparin (LOVENOX) injection  40 mg Subcutaneous Q24H  . ferrous sulfate  325 mg Oral Q breakfast  . fluticasone furoate-vilanterol  1 puff Inhalation Daily  . hydrochlorothiazide  25 mg Oral Daily  . lisinopril  40 mg Oral Daily  . loratadine  10 mg Oral Daily  . pantoprazole  40 mg Oral Q1200  . predniSONE  40 mg Oral Q breakfast   Continuous Infusions: . cefTRIAXone (ROCEPHIN)  IV Stopped (05/19/19 2306)     LOS: 3 days   Marylu Lund, MD [facility] Pager On Amion  If 7PM-7AM, please contact night-coverage 05/20/2019, 1:16 PM

## 2019-05-20 NOTE — Progress Notes (Signed)
PHARMACIST - PHYSICIAN COMMUNICATION ° °CONCERNING: Antibiotic IV to Oral Route Change Policy ° °RECOMMENDATION: °This patient is receiving azithromycin by the intravenous route.  Based on criteria approved by the Pharmacy and Therapeutics Committee, the antibiotic(s) is/are being converted to the equivalent oral dose form(s). ° ° °DESCRIPTION: °These criteria include: °Patient being treated for a respiratory tract infection, urinary tract infection, cellulitis or clostridium difficile associated diarrhea if on metronidazole °The patient is not neutropenic and does not exhibit a GI malabsorption state °The patient is eating (either orally or via tube) and/or has been taking other orally administered medications for a least 24 hours °The patient is improving clinically and has a Tmax < 100.5 ° °If you have questions about this conversion, please contact the Pharmacy Department  °[]  ( [phone] )  [place] °[]  ( [phone] )  [facility] °[x]  ( [phone] )  [place] °[]  ( [phone] )  [facility] °[]  ( [phone] )  [facility]   ° ° °Granger Chui, PharmD °Clinical Pharmacist °**Pharmacist phone directory can now be found on [email or web address] ([email or web address]).  Listed under [facility]. ° ° °

## 2019-05-21 DIAGNOSIS — R7989 Other specified abnormal findings of blood chemistry: Secondary | ICD-10-CM

## 2019-05-21 LAB — CBC WITH DIFFERENTIAL/PLATELET
Abs Immature Granulocytes: 0.11 10*3/uL — ABNORMAL HIGH (ref 0.00–0.07)
Basophils Absolute: 0 10*3/uL (ref 0.0–0.1)
Basophils Relative: 0 %
Eosinophils Absolute: 0.1 10*3/uL (ref 0.0–0.5)
Eosinophils Relative: 1 %
HCT: 34.4 % — ABNORMAL LOW (ref 36.0–46.0)
Hemoglobin: 10 g/dL — ABNORMAL LOW (ref 12.0–15.0)
Immature Granulocytes: 1 %
Lymphocytes Relative: 23 %
Lymphs Abs: 5.6 10*3/uL — ABNORMAL HIGH (ref 0.7–4.0)
MCH: 19 pg — ABNORMAL LOW (ref 26.0–34.0)
MCHC: 29.1 g/dL — ABNORMAL LOW (ref 30.0–36.0)
MCV: 65.5 fL — ABNORMAL LOW (ref 80.0–100.0)
Monocytes Absolute: 2.1 10*3/uL — ABNORMAL HIGH (ref 0.1–1.0)
Monocytes Relative: 9 %
Neutro Abs: 16.2 10*3/uL — ABNORMAL HIGH (ref 1.7–7.7)
Neutrophils Relative %: 66 %
Platelets: 575 10*3/uL — ABNORMAL HIGH (ref 150–400)
RBC: 5.25 MIL/uL — ABNORMAL HIGH (ref 3.87–5.11)
RDW: 18.9 % — ABNORMAL HIGH (ref 11.5–15.5)
WBC: 24.3 10*3/uL — ABNORMAL HIGH (ref 4.0–10.5)
nRBC: 0.2 % (ref 0.0–0.2)

## 2019-05-21 LAB — BASIC METABOLIC PANEL
Anion gap: 12 (ref 5–15)
BUN: 17 mg/dL (ref 6–20)
CO2: 31 mmol/L (ref 22–32)
Calcium: 9.6 mg/dL (ref 8.9–10.3)
Chloride: 95 mmol/L — ABNORMAL LOW (ref 98–111)
Creatinine, Ser: 0.8 mg/dL (ref 0.44–1.00)
GFR calc Af Amer: >60 mL/min (ref >60)
GFR calc non Af Amer: >60 mL/min (ref >60)
Glucose, Bld: 95 mg/dL (ref 70–99)
Potassium: 3.5 mmol/L (ref 3.5–5.1)
Sodium: 138 mmol/L (ref 135–145)

## 2019-05-21 MED ORDER — WHITE PETROLATUM EX OINT
TOPICAL_OINTMENT | CUTANEOUS | Status: DC | PRN
  Filled 2019-05-21: qty 28.35

## 2019-05-21 NOTE — Plan of Care (Signed)
  Problem: Education: Goal: Knowledge of General Education information will improve Description: Including pain rating scale, medication(s)/side effects and non-pharmacologic comfort measures Outcome: Progressing   Problem: Health Behavior/Discharge Planning: Goal: Ability to manage health-related needs will improve Outcome: Progressing   Problem: Clinical Measurements: Goal: Ability to maintain clinical measurements within normal limits will improve Outcome: Progressing Goal: Will remain free from infection Outcome: Progressing Goal: Diagnostic test results will improve Outcome: Progressing Goal: Respiratory complications will improve Outcome: Progressing Goal: Cardiovascular complication will be avoided Outcome: Progressing   Problem: Activity: Goal: Risk for activity intolerance will decrease Outcome: Progressing   Problem: Nutrition: Goal: Adequate nutrition will be maintained Outcome: Progressing   Problem: Elimination: Goal: Will not experience complications related to bowel motility Outcome: Progressing Goal: Will not experience complications related to urinary retention Outcome: Progressing   Problem: Pain Managment: Goal: General experience of comfort will improve Outcome: Progressing   Problem: Skin Integrity: Goal: Risk for impaired skin integrity will decrease Outcome: Progressing

## 2019-05-21 NOTE — Progress Notes (Signed)
PROGRESS NOTE    Teresa Mendez  [id number] DOB: [birth year] DOA: 05/17/2019 PCP: [name], NP    Brief Narrative:  44 y.o.femalewithhistory of HTN, asthma, sarcoidosis presents to the ER because of worsening shortness of breath ongoing for last 1 week which started off after patient attended funeral. Patient had some sinus discharge which gradually worsened to cough and shortness of breath. Patient has not had any chest pain until patient reached the ER which lasted a few minutes and resolved without any intervention. In the ER patient was requiring BiPAP to maintain sats. CT angiogram of the chest was negative for pulmonary embolism but does show bilateral groundglass opacities. Covid test was negative. Blood pressure was more than [id number] systolic by [id number] diastolic for which patient was started on nitroglycerin infusion. Lasix was given. Patient also started on empiric antibiotics and nebulizer. Initially critical care was consulted since patient symptoms was improving patient is admitted to stepdown. Labs reveal AST 52 ALT is 47 BNP 241 high sensitive troponin was 58 and 76. Sinus tachycardia nonspecific ST changes. WBC count was 19.4 hemoglobin 9.9 platelets 512. Patient admitted for acute respiratory failure hypoxia likely combination of possible pulmonary edema, pneumonia and asthma.  Assessment & Plan:   Principal Problem:   Acute respiratory failure with hypoxia (HCC) Active Problems:   PULMONARY SARCOIDOSIS   Intrinsic asthma   Hypertensive urgency   Chest pain   Acute hypoxic and hypercarbic respiratory failure likely 2/2 hypertensive crisis/flash pulmonary edema Possible CAP Possible asthma exacerbation Initially requiring BiPAP, currently weaned to 1L of O2  Currently afebrile, with leukocytosis (on steroids) COVID-19 negative, flu negative Recent ABG showed pH of 7.34, PCO2 62.4, PO2 98, repeat with some improvement in PCO2 Procalcitonin negative CTA  chest noted 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, groundglass opacity could reflect early infiltrate/pneumonia, no evidence of PE PCCM had been following, recommended cont abx and to complete 5 days of abx Continued on azithromycin, ceftriaxone S/P Solu-Medrol--> now on prednisone, DuoNeb, Pulmicort Cont with supplemental oxygen as needed, BiPAP as needed/nightly Seems to have increased air movement this AM, still O2 dependent. Pt O2 naive. Cont to attempt to wean O2, if unable, may benefit from home O2 given underlying sarcoid  Hypertensive crisis Suboptimally controlled S/p nitroglycerin infusion Continued on home lisinopril, hydrochlorothiazide, amlodipine On PRN hydralazine Given concerns of asthma, would avoid beta blocker  Hypokalemia Normal this AM Recheck bmet in AM  Atypical chest pain Currently no chest pain Likely 2/2 above Troponins with flat trend, no delta gap, EKG with no acute ST changes Echo demonstrated EF of 60 to 65%, no regional wall motion abnormalities, mild left ventricular hypertrophy, left ventricular diastolic parameters were indeterminate Cont to monitor on tele  Microcytic iron def anemia/thrombocytosis Hemoglobin downtrending Reports heavy periods, history of fibroids Anemia panel showed iron 12, TIBC 459, sats 38, ferritin 6, vitamin B12 530, folate 13.2 Status post 1 dose of Feraheme, continue ferrous sulfate Follow-up as an outpatient with OB/GYN Cont to follow CBC  History of sarcoidosis Continue outpatient follow-up  Tobacco abuse Reports about a pack a day, has not smoked for the past 2 weeks Cessation earlier this visit  Morbid obesity Recommend diet/lifestyle modification  DVT prophylaxis: Lovenox subq Code Status: Full Family Communication: Pt in room, family not at bedside  Status is: Inpatient  Remains inpatient appropriate because:Inpatient level of care appropriate due to severity of illness, still O2  dependent in an O2 naive patient   Dispo: The patient  is from: Home              Anticipated d/c is to: Home              Anticipated d/c date is: 2 days              Patient currently is not medically stable to d/c.        Consultants:   Pulmonary  Procedures:     Antimicrobials: Anti-infectives (From admission, onward)   Start     Dose/Rate Route Frequency Ordered Stop   05/20/19 2200  azithromycin (ZITHROMAX) tablet 500 mg     500 mg Oral Daily at bedtime 05/20/19 0749 05/23/19 2159   05/18/19 2200  cefTRIAXone (ROCEPHIN) 2 g in sodium chloride 0.9 % 100 mL IVPB     2 g 200 mL/hr over 30 Minutes Intravenous Every 24 hours 05/18/19 0216 05/23/19 2159   05/18/19 2100  azithromycin (ZITHROMAX) 500 mg in sodium chloride 0.9 % 250 mL IVPB  Status:  Discontinued     500 mg 250 mL/hr over 60 Minutes Intravenous Every 24 hours 05/18/19 0216 05/20/19 0749   05/17/19 2030  cefTRIAXone (ROCEPHIN) 1 g in sodium chloride 0.9 % 100 mL IVPB     1 g 200 mL/hr over 30 Minutes Intravenous  Once 05/17/19 2024 05/17/19 2141   05/17/19 2030  azithromycin (ZITHROMAX) 500 mg in sodium chloride 0.9 % 250 mL IVPB     500 mg 250 mL/hr over 60 Minutes Intravenous  Once 05/17/19 2024 05/17/19 2250      Subjective: Some SOB but overall improved from yesterday  Objective: Vitals:   05/21/19 0800 05/21/19 0842 05/21/19 0925 05/21/19 1118  BP: (!) 160/103  (!) 170/80 (!) 137/100  Pulse: (!) 110  (!) 104 (!) 109  Resp: (!) 22  (!) 23 13  Temp: (!) 97.5 F (36.4 C)  98.3 F (36.8 C) 98 F (36.7 C)  TempSrc: Oral  Oral Oral  SpO2: 95% 96% 93% 97%  Weight:      Height:        Intake/Output Summary (Last 24 hours) at 05/21/2019 1529 Last data filed at 05/21/2019 1249 Gross per 24 hour  Intake 696.86 ml  Output 2850 ml  Net -2153.14 ml   Filed Weights   05/19/19 0426 05/20/19 0538 05/21/19 0514  Weight: 113.4 kg 111.6 kg 110.1 kg    Examination: General exam: Awake, laying in bed,  in nad Respiratory system: decreased bs in all fields, no audible wheezing Cardiovascular system: regular rate, s1, s2 Gastrointestinal system: Soft, nondistended, positive BS Central nervous system: CN2-12 grossly intact, strength intact Extremities: Perfused, no clubbing Skin: Normal skin turgor, no notable skin lesions seen Psychiatry: Mood normal // no visual hallucinations   Data Reviewed: I have personally reviewed following labs and imaging studies  CBC: Recent Labs  Lab 05/17/19 1608 05/17/19 2138 05/18/19 0213 05/18/19 1201 05/19/19 0323 05/20/19 0232 05/21/19 0320  WBC 19.4*  --  13.1*  --  19.4* 19.4* 24.3*  NEUTROABS 15.3*  --  11.5*  --  13.9* 13.3* 16.2*  HGB 9.9*   < > 8.7* 9.5* 7.9* 9.3* 10.0*  HCT 33.8*   < > 30.0* 28.0* 26.4* 31.3* 34.4*  MCV 67.2*  --  67.1*  --  66.0* 65.3* 65.5*  PLT 512*  --  449*  --  446* 480* 575*   < > = values in this interval not displayed.   Basic Metabolic Panel: Recent Labs  Lab 05/17/19 1608 05/17/19 2138 05/18/19 0213 05/18/19 1201 05/19/19 0323 05/20/19 0232 05/21/19 0320  NA 140   < > 138 141 139 140 138  K 3.7   < > 3.6 3.1* 3.3* 3.7 3.5  CL 98  --  95*  --  96* 97* 95*  CO2 30  --  31  --  36* 32 31  GLUCOSE 138*  --  165*  --  109* 92 95  BUN 9  --  10  --  17 16 17   CREATININE 0.87  --  0.94  --  0.78 0.73 0.80  CALCIUM 8.5*  --  8.4*  --  8.7* 9.1 9.6  MG  --   --   --   --  2.0  --   --    < > = values in this interval not displayed.   GFR: Estimated Creatinine Clearance: 106.7 mL/min (by C-G formula based on SCr of 0.8 mg/dL). Liver Function Tests: Recent Labs  Lab 05/17/19 1608 05/18/19 0213  AST 52* 26  ALT 47* 39  ALKPHOS 69 56  BILITOT 0.7 0.7  PROT 7.3 6.6  ALBUMIN 3.7 3.2*   Recent Labs  Lab 05/17/19 1608  LIPASE 15   No results for input(s): AMMONIA in the last 168 hours. Coagulation Profile: Recent Labs  Lab 05/17/19 1608  INR 1.0   Cardiac Enzymes: No results for input(s):  CKTOTAL, CKMB, CKMBINDEX, TROPONINI in the last 168 hours. BNP (last 3 results) No results for input(s): PROBNP in the last 8760 hours. HbA1C: No results for input(s): HGBA1C in the last 72 hours. CBG: No results for input(s): GLUCAP in the last 168 hours. Lipid Profile: No results for input(s): CHOL, HDL, LDLCALC, TRIG, CHOLHDL, LDLDIRECT in the last 72 hours. Thyroid Function Tests: Recent Labs    05/19/19 0323  TSH 0.532   Anemia Panel: No results for input(s): VITAMINB12, FOLATE, FERRITIN, TIBC, IRON, RETICCTPCT in the last 72 hours. Sepsis Labs: Recent Labs  Lab 05/17/19 1608 05/18/19 1237 05/19/19 0323  PROCALCITON  --  <0.10 <0.10  LATICACIDVEN 0.9  --   --     Recent Results (from the past 240 hour(s))  Respiratory Panel by RT PCR (Flu A&B, Covid) - Nasopharyngeal Swab     Status: None   Collection Time: 05/17/19  3:48 PM   Specimen: Nasopharyngeal Swab  Result Value Ref Range Status   SARS Coronavirus 2 by RT PCR NEGATIVE NEGATIVE Final    Comment: (NOTE) SARS-CoV-2 target nucleic acids are NOT DETECTED. The SARS-CoV-2 RNA is generally detectable in upper respiratoy specimens during the acute phase of infection. The lowest concentration of SARS-CoV-2 viral copies this assay can detect is 131 copies/mL. A negative result does not preclude SARS-Cov-2 infection and should not be used as the sole basis for treatment or other patient management decisions. A negative result may occur with  improper specimen collection/handling, submission of specimen other than nasopharyngeal swab, presence of viral mutation(s) within the areas targeted by this assay, and inadequate number of viral copies (<131 copies/mL). A negative result must be combined with clinical observations, patient history, and epidemiological information. The expected result is Negative. Fact Sheet for Patients:  PinkCheek.be Fact Sheet for Healthcare Providers:   GravelBags.it This test is not yet ap proved or cleared by the [place] FDA and  has been authorized for detection and/or diagnosis of SARS-CoV-2 by FDA under an Emergency Use Authorization (EUA). This EUA will remain  in effect (meaning  this test can be used) for the duration of the COVID-19 declaration under Section 564(b)(1) of the Act, 21 U.S.C. section 360bbb-3(b)(1), unless the authorization is terminated or revoked sooner.    Influenza A by PCR NEGATIVE NEGATIVE Final   Influenza B by PCR NEGATIVE NEGATIVE Final    Comment: (NOTE) The Xpert Xpress SARS-CoV-2/FLU/RSV assay is intended as an aid in  the diagnosis of influenza from Nasopharyngeal swab specimens and  should not be used as a sole basis for treatment. Nasal washings and  aspirates are unacceptable for Xpert Xpress SARS-CoV-2/FLU/RSV  testing. Fact Sheet for Patients: PinkCheek.be Fact Sheet for Healthcare Providers: GravelBags.it This test is not yet approved or cleared by the [place] FDA and  has been authorized for detection and/or diagnosis of SARS-CoV-2 by  FDA under an Emergency Use Authorization (EUA). This EUA will remain  in effect (meaning this test can be used) for the duration of the  Covid-19 declaration under Section 564(b)(1) of the Act, 21  U.S.C. section 360bbb-3(b)(1), unless the authorization is  terminated or revoked. Performed at [facility], [place] [address] [place], [place] [address]   Blood culture (routine x 2)     Status: None (Preliminary result)   Collection Time: 05/17/19  3:49 PM   Specimen: BLOOD  Result Value Ref Range Status   Specimen Description BLOOD RIGHT ANTECUBITAL  Final   Special Requests   Final    BOTTLES DRAWN AEROBIC AND ANAEROBIC Blood Culture results may not be optimal due to an inadequate volume of blood received in culture bottles   Culture   Final    NO GROWTH  4 DAYS Performed at [facility], [place] [address] [place], [place] [address]    Report Status PENDING  Incomplete  Blood culture (routine x 2)     Status: None (Preliminary result)   Collection Time: 05/17/19  4:09 PM   Specimen: BLOOD RIGHT HAND  Result Value Ref Range Status   Specimen Description BLOOD RIGHT HAND  Final   Special Requests   Final    BOTTLES DRAWN AEROBIC AND ANAEROBIC Blood Culture adequate volume   Culture   Final    NO GROWTH 4 DAYS Performed at [facility], [place] [address], [place], [place] [address]    Report Status PENDING  Incomplete  Urine Culture     Status: Abnormal   Collection Time: 05/18/19 12:28 AM   Specimen: Urine, Random  Result Value Ref Range Status   Specimen Description URINE, RANDOM  Final   Special Requests   Final    NONE Performed at [facility], [place] [address] [place], [place] [address]    Culture MULTIPLE SPECIES PRESENT, SUGGEST RECOLLECTION (A)  Final   Report Status 05/19/2019 FINAL  Final  MRSA PCR Screening     Status: None   Collection Time: 05/18/19 11:43 PM   Specimen: Nasopharyngeal  Result Value Ref Range Status   MRSA by PCR NEGATIVE NEGATIVE Final    Comment:        The GeneXpert MRSA Assay (FDA approved for NASAL specimens only), is one component of a comprehensive MRSA colonization surveillance program. It is not intended to diagnose MRSA infection nor to guide or monitor treatment for MRSA infections. Performed at [facility], [place] [address] [place], [place] [address]      Radiology Studies: No results found.  Scheduled Meds: . amLODipine  10 mg Oral Daily  . aspirin  81 mg Oral Daily  . azithromycin  500 mg Oral QHS  . enoxaparin (LOVENOX) injection  40 mg Subcutaneous Q24H  . ferrous sulfate  325 mg Oral Q breakfast  . fluticasone furoate-vilanterol  1 puff Inhalation Daily  . hydrochlorothiazide  25 mg Oral Daily  . lisinopril  40 mg Oral Daily  . loratadine  10 mg  Oral Daily  . pantoprazole  40 mg Oral Q1200   Continuous Infusions: . cefTRIAXone (ROCEPHIN)  IV Stopped (05/20/19 2142)     LOS: 4 days   Marylu Lund, MD [facility] Pager On Amion  If 7PM-7AM, please contact night-coverage 05/21/2019, 3:29 PM

## 2019-05-22 LAB — BASIC METABOLIC PANEL
Anion gap: 12 (ref 5–15)
BUN: 20 mg/dL (ref 6–20)
CO2: 32 mmol/L (ref 22–32)
Calcium: 9.3 mg/dL (ref 8.9–10.3)
Chloride: 94 mmol/L — ABNORMAL LOW (ref 98–111)
Creatinine, Ser: 0.7 mg/dL (ref 0.44–1.00)
GFR calc Af Amer: >60 mL/min (ref >60)
GFR calc non Af Amer: >60 mL/min (ref >60)
Glucose, Bld: 101 mg/dL — ABNORMAL HIGH (ref 70–99)
Potassium: 3.7 mmol/L (ref 3.5–5.1)
Sodium: 138 mmol/L (ref 135–145)

## 2019-05-22 LAB — CBC WITH DIFFERENTIAL/PLATELET
Abs Immature Granulocytes: 0 10*3/uL (ref 0.00–0.07)
Band Neutrophils: 1 %
Basophils Absolute: 0.2 10*3/uL — ABNORMAL HIGH (ref 0.0–0.1)
Basophils Relative: 1 %
Eosinophils Absolute: 0.6 10*3/uL — ABNORMAL HIGH (ref 0.0–0.5)
Eosinophils Relative: 3 %
HCT: 33.4 % — ABNORMAL LOW (ref 36.0–46.0)
Hemoglobin: 9.7 g/dL — ABNORMAL LOW (ref 12.0–15.0)
Lymphocytes Relative: 38 %
Lymphs Abs: 8.2 10*3/uL — ABNORMAL HIGH (ref 0.7–4.0)
MCH: 19.4 pg — ABNORMAL LOW (ref 26.0–34.0)
MCHC: 29 g/dL — ABNORMAL LOW (ref 30.0–36.0)
MCV: 66.8 fL — ABNORMAL LOW (ref 80.0–100.0)
Monocytes Absolute: 0.4 10*3/uL (ref 0.1–1.0)
Monocytes Relative: 2 %
Neutro Abs: 12 10*3/uL — ABNORMAL HIGH (ref 1.7–7.7)
Neutrophils Relative %: 55 %
Platelets: 557 10*3/uL — ABNORMAL HIGH (ref 150–400)
RBC: 5 MIL/uL (ref 3.87–5.11)
RDW: 19.5 % — ABNORMAL HIGH (ref 11.5–15.5)
WBC: 21.5 10*3/uL — ABNORMAL HIGH (ref 4.0–10.5)
nRBC: 0 % (ref 0.0–0.2)
nRBC: 0 /100 WBC

## 2019-05-22 LAB — CULTURE, BLOOD (ROUTINE X 2)
Culture: NO GROWTH
Culture: NO GROWTH
Special Requests: ADEQUATE

## 2019-05-22 LAB — ALPHA-1 ANTITRYPSIN PHENOTYPE: A-1 Antitrypsin, Ser: 184 mg/dL (ref 101–187)

## 2019-05-22 MED ORDER — AZITHROMYCIN 500 MG PO TABS: 500.0000 mg | ORAL_TABLET | Freq: Every day | ORAL | 0 refills | Status: AC

## 2019-05-22 MED ORDER — DOCUSATE SODIUM 100 MG PO CAPS: 100.0000 mg | ORAL_CAPSULE | Freq: Two times a day (BID) | ORAL | 0 refills | Status: AC

## 2019-05-22 MED ORDER — ASPIRIN 81 MG PO CHEW: 81.0000 mg | CHEWABLE_TABLET | Freq: Every day | ORAL | 0 refills | Status: AC

## 2019-05-22 MED ORDER — FERROUS SULFATE 325 (65 FE) MG PO TABS: 325.0000 mg | ORAL_TABLET | Freq: Every day | ORAL | 0 refills | Status: DC

## 2019-05-22 MED ORDER — ONDANSETRON HCL 4 MG/2ML IJ SOLN
4.0000 mg | Freq: Four times a day (QID) | INTRAMUSCULAR | Status: DC | PRN
  Administered 2019-05-22: 10:00:00 4 mg via INTRAVENOUS
  Filled 2019-05-22: qty 2

## 2019-05-22 MED ORDER — CEFDINIR 300 MG PO CAPS: 300.0000 mg | ORAL_CAPSULE | Freq: Two times a day (BID) | ORAL | 0 refills | Status: AC

## 2019-05-22 MED FILL — AZITHROMYCIN 500 MG TABS: 500 | 2 days supply | Qty: 2 | Fill #0

## 2019-05-22 MED FILL — CEFDINIR 300 MG CAPSULE: 300 | 2 days supply | Qty: 4 | Fill #0

## 2019-05-22 MED FILL — ASPIRIN LOW DOSE 81 MG CHEW: 81 | 30 days supply | Qty: 30 | Fill #0

## 2019-05-22 MED FILL — FERROUS SULFATE 325 MG TAB: 325 (65 FE) | 30 days supply | Qty: 30 | Fill #0

## 2019-05-22 MED FILL — DOK 100 MG CAPS: 100 | 30 days supply | Qty: 60 | Fill #0

## 2019-05-22 NOTE — TOC Progression Note (Signed)
Transition of Care [facility]) - Progression Note    Patient Details  Name: Teresa Mendez MRN: [id number] Date of Birth: October 03, 1975  Transition of Care [facility]) CM/SW Contact  Zenon Mayo, RN Phone Number: 05/22/2019, 1:54 PM  Clinical Narrative:    From home, uncontrollable BP, IV antibiotics, PO steroids, 2-3L of O2, may need O2 at home at discharge. Bipap prn.  TOC team will continue to follow for dc needs.        Expected Discharge Plan and Services                                                 Social Determinants of Health (SDOH) Interventions    Readmission Risk Interventions No flowsheet data found.

## 2019-05-22 NOTE — Discharge Summary (Signed)
Physician Discharge Summary  MARESSA TUNGATE [id number] DOB: [birth year] DOA: 05/17/2019  PCP: [name], NP  Admit date: 05/17/2019 Discharge date: 05/22/2019  Admitted From: Home Disposition:  Home  Recommendations for Outpatient Follow-up:  1. Follow up with PCP in 1-2 weeks 2. Follow up with Pulmonary as scheduled  Equipment/Devices:Home O2    Discharge Condition:Stable CODE STATUS:Full Diet recommendation: Regular   Brief/Interim Summary: 44 y.o.femalewithhistory of HTN, asthma, sarcoidosis presents to the ER because of worsening shortness of breath ongoing for last 1 week which started off after patient attended funeral. Patient had some sinus discharge which gradually worsened to cough and shortness of breath. Patient has not had any chest pain until patient reached the ER which lasted a few minutes and resolved without any intervention. In the ER patient was requiring BiPAP to maintain sats. CT angiogram of the chest was negative for pulmonary embolism but does show bilateral groundglass opacities. Covid test was negative. Blood pressure was more than [id number] systolic by [id number] diastolic for which patient was started on nitroglycerin infusion. Lasix was given. Patient also started on empiric antibiotics and nebulizer. Initially critical care was consulted since patient symptoms was improving patient is admitted to stepdown. Labs reveal AST 52 ALT is 47 BNP 241 high sensitive troponin was 58 and 76. Sinus tachycardia nonspecific ST changes. WBC count was 19.4 hemoglobin 9.9 platelets 512. Patient admitted for acute respiratory failure hypoxia likely combination of possible pulmonary edema, pneumonia and asthma.  Discharge Diagnoses:  Principal Problem:   Acute respiratory failure with hypoxia (HCC) Active Problems:   PULMONARY SARCOIDOSIS   Intrinsic asthma   Hypertensive urgency   Chest pain  Acute hypoxic and hypercarbic respiratory failure likely 2/2  hypertensive crisis/flash pulmonary edema Possible CAP Possible asthma exacerbation Initially requiring BiPAP, currently weaned to 1L of O2  Currently afebrile, with leukocytosis (on steroids) COVID-19 negative, flu negative Recent ABG showed pH of 7.34, PCO2 62.4, PO2 98, repeatwith some improvement in PCO2 Procalcitoninnegative CTA chest noted 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, groundglass opacity could reflect early infiltrate/pneumonia, no evidence of PE PCCM had been following, recommended cont abx Will complete 2 more days of azithromycin and omnicef on d/c S/PSolu-Medrol--> completed prednisone,DuoNeb, Pulmicort [facility]  Hypertensive crisis Had been suboptimally controlled S/pnitroglycerin infusion Continued on home lisinopril, hydrochlorothiazide, amlodipine Given concerns of asthma, would avoid beta blocker BP better at time of discharge  Hypokalemia Normal this AM  Atypical chest pain Currently no chest pain Likely 2/2 above Troponins with flat trend, no delta gap, EKG with no acute ST changes Echo demonstrated EF of 60 to 65%, no regional wall motion abnormalities, mild left ventricular hypertrophy, left ventricular diastolic parameters were indeterminate  Microcyticiron defanemia/thrombocytosis Hemoglobin downtrending Reports heavy periods, history of fibroids Anemia panelshowed iron 12,TIBC 459, sats 38, ferritin 6, vitamin B12 530, folate 13.2 Status post 1 dose of Feraheme, continue ferrous sulfate Follow-up as an outpatient with OB/GYN  History of sarcoidosis Continue outpatient follow-up  Tobacco abuse Reports about a pack a day, has not smoked for the past 2 weeks Cessation earlier this visit  Morbid obesity Recommend diet/lifestyle modification  Discharge Instructions   Allergies as of 05/22/2019   No Known Allergies     Medication List    STOP taking these medications   acetaminophen-codeine  300-30 MG tablet Commonly known as: TYLENOL #3   cyclobenzaprine 10 MG tablet Commonly known as: FLEXERIL   ibuprofen 600 MG tablet Commonly known as: ADVIL  TAKE these medications   albuterol (2.5 MG/3ML) 0.083% nebulizer solution Commonly known as: PROVENTIL TAKE 3 MLS (2.5 MG TOTAL) BY NEBULIZATION EVERY 6 (SIX) HOURS AS NEEDED FOR WHEEZING OR SHORTNESS OF BREATH.   albuterol 108 (90 Base) MCG/ACT inhaler Commonly known as: VENTOLIN HFA INHALE 1-2 PUFFS INTO THE LUNGS EVERY 6 (SIX) HOURS AS NEEDED FOR WHEEZING OR SHORTNESS OF BREATH (COUGH).   amLODipine 10 MG tablet Commonly known as: NORVASC Take 1 tablet (10 mg total) by mouth daily.   aspirin 81 MG chewable tablet Chew 1 tablet (81 mg total) by mouth daily. Start taking on: May 23, 2019   azithromycin 500 MG tablet Commonly known as: ZITHROMAX Take 1 tablet (500 mg total) by mouth at bedtime for 2 days.   budesonide-formoterol 160-4.5 MCG/ACT inhaler Commonly known as: SYMBICORT Inhale 2 puffs into the lungs 2 (two) times daily.   cefdinir 300 MG capsule Commonly known as: OMNICEF Take 1 capsule (300 mg total) by mouth 2 (two) times daily for 2 days.   docusate sodium 100 MG capsule Commonly known as: Colace Take 1 capsule (100 mg total) by mouth 2 (two) times daily.   ferrous sulfate 325 (65 FE) MG tablet Take 1 tablet (325 mg total) by mouth daily with breakfast. Start taking on: May 23, 2019   hydrochlorothiazide 25 MG tablet Commonly known as: HYDRODIURIL Take 1 tablet (25 mg total) by mouth daily.   lisinopril 40 MG tablet Commonly known as: ZESTRIL TAKE 1 TABLET (40 MG TOTAL) BY MOUTH DAILY.            Durable Medical Equipment  (From admission, onward)         Start     Ordered   05/22/19 1524  For home use only DME oxygen  Once    Question Answer Comment  Length of Need Lifetime   Mode or (Route) Nasal cannula   Liters per Minute 3   Oxygen delivery system Gas       05/22/19 1523         Follow-up Information    [name], NP Follow up on 05/29/2019.   Specialty: Pulmonary Disease Why: 3pm  Contact information: [address] [place] [address] [phone]        [name], NP. Schedule an appointment as soon as possible for a visit in 1 week(s).   Specialty: Nurse Practitioner Contact information: [address] [place] [address] [phone]          No Known Allergies  Consultations:  PCCM  Procedures/Studies: CT Angio Chest PE W and/or Wo Contrast  Result Date: 05/17/2019 CLINICAL DATA:  Sarcoidosis, asthma.  Positive D-dimer. EXAM: CT ANGIOGRAPHY CHEST WITH CONTRAST TECHNIQUE: Multidetector CT imaging of the chest was performed using the standard protocol during bolus administration of intravenous contrast. Multiplanar CT image reconstructions and MIPs were obtained to evaluate the vascular anatomy. CONTRAST:  39mL OMNIPAQUE IOHEXOL 350 MG/ML SOLN COMPARISON:  06/10/2007 FINDINGS: Cardiovascular: No filling defects in the pulmonary arteries to suggest pulmonary emboli. Heart is normal size. Aorta is normal caliber. Mediastinum/Nodes: No mediastinal, hilar, or axillary adenopathy. Trachea and esophagus are unremarkable. Thyroid unremarkable. Lungs/Pleura: Large bulla in the upper lobes bilaterally. Scarring in the left lower lobe. Ground-glass airspace opacity in the left lower lobe at the left lung base. No effusions. Upper Abdomen: Imaging into the upper abdomen shows no acute findings. Musculoskeletal: Chest wall soft tissues are unremarkable. No acute bony abnormality. Review  of the MIP images confirms the above findings. 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 Ground-glass opacity posterolaterally at the left lung base could reflect early infiltrate/pneumonia. No evidence of pulmonary embolus. Electronically Signed   By: Rolm Baptise M.D.   On: 05/17/2019 20:21   DG Chest  Portable 1 View  Result Date: 05/17/2019 CLINICAL DATA:  Respiratory distress, cough. EXAM: PORTABLE CHEST 1 VIEW COMPARISON:  November 08, 2015. FINDINGS: The heart size and mediastinal contours are within normal limits. No pneumothorax or pleural effusion is noted. Bleb is noted in right upper lobe. Lungs are otherwise clear given overlying soft tissue artifact. The visualized skeletal structures are unremarkable. IMPRESSION: No active disease. Electronically Signed   By: Marijo Conception M.D.   On: 05/17/2019 16:37   ECHOCARDIOGRAM COMPLETE  Result Date: 05/18/2019    ECHOCARDIOGRAM REPORT   Patient Name:   GWENNETTE CAMARATA Date of Exam: 05/18/2019 Medical Rec #:  [id number]      Height:       64.0 in Accession #:    [id number]     Weight:       254.0 lb Date of Birth:  02/21/1975       BSA:          2.166 m Patient Age:    8 years       BP:           143/89 mmHg Patient Gender: F              HR:           107 bpm. Exam Location:  Inpatient Procedure: 2D Echo, Cardiac Doppler and Color Doppler Indications:    R60.0 Lower extremity edema  History:        Patient has no prior history of Echocardiogram examinations.                 Risk Factors:Hypertension. Sickle Cell Trait. Sarcoidosis.  Sonographer:    Jonelle Sidle Dance Referring Phys: [place]  1. Left ventricular ejection fraction, by estimation, is 60 to 65%. The left ventricle has normal function. The left ventricle has no regional wall motion abnormalities. There is mild left ventricular hypertrophy. Left ventricular diastolic parameters are indeterminate.  2. Right ventricular systolic function is normal. The right ventricular size is normal.  3. Left atrial size was mildly dilated.  4. The mitral valve is normal in structure. No evidence of mitral valve regurgitation. No evidence of mitral stenosis.  5. The aortic valve is tricuspid. Aortic valve regurgitation is trivial. No aortic stenosis is present.  6. Aortic dilatation noted.  Aneurysm of the ascending aorta, measuring 38 mm.  7. The inferior vena cava is normal in size with greater than 50% respiratory variability, suggesting right atrial pressure of 3 mmHg. FINDINGS  Left Ventricle: Left ventricular ejection fraction, by estimation, is 60 to 65%. The left ventricle has normal function. The left ventricle has no regional wall motion abnormalities. The left ventricular internal cavity size was normal in size. There is  mild left ventricular hypertrophy. Left ventricular diastolic parameters are indeterminate. Right Ventricle: The right ventricular size is normal. No increase in right ventricular wall thickness. Right ventricular systolic function is normal. Left Atrium: Left atrial size was mildly dilated. Right Atrium: Right atrial size was normal in size. Pericardium: There is no evidence of pericardial effusion. Mitral Valve: The mitral valve is normal in structure. Normal mobility of the mitral valve leaflets. No evidence  of mitral valve regurgitation. No evidence of mitral valve stenosis. Tricuspid Valve: The tricuspid valve is normal in structure. Tricuspid valve regurgitation is not demonstrated. No evidence of tricuspid stenosis. Aortic Valve: The aortic valve is tricuspid. Aortic valve regurgitation is trivial. No aortic stenosis is present. Pulmonic Valve: The pulmonic valve was normal in structure. Pulmonic valve regurgitation is not visualized. No evidence of pulmonic stenosis. Aorta: Aortic dilatation noted. There is an aneurysm involving the ascending aorta. The aneurysm measures 38 mm. Venous: The inferior vena cava is normal in size with greater than 50% respiratory variability, suggesting right atrial pressure of 3 mmHg. IAS/Shunts: No atrial level shunt detected by color flow Doppler.  LEFT VENTRICLE PLAX 2D LVIDd:         4.54 cm LVIDs:         3.05 cm LV PW:         1.34 cm LV IVS:        1.20 cm LVOT diam:     1.90 cm LV SV:         54 LV SV Index:   25 LVOT Area:      2.84 cm  RIGHT VENTRICLE             IVC RV Basal diam:  2.88 cm     IVC diam: 1.40 cm RV S prime:     14.50 cm/s TAPSE (M-mode): 2.3 cm LEFT ATRIUM             Index       RIGHT ATRIUM           Index LA diam:        4.30 cm 1.99 cm/m  RA Area:     16.90 cm LA Vol (A2C):   88.9 ml 41.05 ml/m RA Volume:   49.10 ml  22.67 ml/m LA Vol (A4C):   53.6 ml 24.75 ml/m LA Biplane Vol: 72.4 ml 33.43 ml/m  AORTIC VALVE LVOT Vmax:   109.00 cm/s LVOT Vmean:  69.300 cm/s LVOT VTI:    0.189 m  AORTA Ao Root diam: 3.20 cm Ao Asc diam:  3.80 cm MV A velocity: 101.75 cm/s                             SHUNTS                             Systemic VTI:  0.19 m                             Systemic Diam: 1.90 cm Candee Furbish MD Electronically signed by Candee Furbish MD Signature Date/Time: 05/18/2019/10:36:29 AM    Final      Subjective: Eager to go home  Discharge Exam: Vitals:   05/22/19 1138 05/22/19 1139  BP:    Pulse:  (!) 106  Resp:  17  Temp: 98.7 F (37.1 C)   SpO2:  97%   Vitals:   05/22/19 1000 05/22/19 1135 05/22/19 1138 05/22/19 1139  BP: (!) 166/79     Pulse:    (!) 106  Resp:    17  Temp:  98.4 F (36.9 C) 98.7 F (37.1 C)   TempSrc:   Oral   SpO2:    97%  Weight:      Height:        General:  Pt is alert, awake, not in acute distress Cardiovascular: RRR, S1/S2 +, no rubs, no gallops Respiratory: CTA bilaterally, no wheezing, no rhonchi Abdominal: Soft, NT, ND, bowel sounds + Extremities: no edema, no cyanosis   The results of significant diagnostics from this hospitalization (including imaging, microbiology, ancillary and laboratory) are listed below for reference.     Microbiology: Recent Results (from the past 240 hour(s))  Respiratory Panel by RT PCR (Flu A&B, Covid) - Nasopharyngeal Swab     Status: None   Collection Time: 05/17/19  3:48 PM   Specimen: Nasopharyngeal Swab  Result Value Ref Range Status   SARS Coronavirus 2 by RT PCR NEGATIVE NEGATIVE Final    Comment:  (NOTE) SARS-CoV-2 target nucleic acids are NOT DETECTED. The SARS-CoV-2 RNA is generally detectable in upper respiratoy specimens during the acute phase of infection. The lowest concentration of SARS-CoV-2 viral copies this assay can detect is 131 copies/mL. A negative result does not preclude SARS-Cov-2 infection and should not be used as the sole basis for treatment or other patient management decisions. A negative result may occur with  improper specimen collection/handling, submission of specimen other than nasopharyngeal swab, presence of viral mutation(s) within the areas targeted by this assay, and inadequate number of viral copies (<131 copies/mL). A negative result must be combined with clinical observations, patient history, and epidemiological information. The expected result is Negative. Fact Sheet for Patients:  PinkCheek.be Fact Sheet for Healthcare Providers:  GravelBags.it This test is not yet ap proved or cleared by the [place] FDA and  has been authorized for detection and/or diagnosis of SARS-CoV-2 by FDA under an Emergency Use Authorization (EUA). This EUA will remain  in effect (meaning this test can be used) for the duration of the COVID-19 declaration under Section 564(b)(1) of the Act, 21 U.S.C. section 360bbb-3(b)(1), unless the authorization is terminated or revoked sooner.    Influenza A by PCR NEGATIVE NEGATIVE Final   Influenza B by PCR NEGATIVE NEGATIVE Final    Comment: (NOTE) The Xpert Xpress SARS-CoV-2/FLU/RSV assay is intended as an aid in  the diagnosis of influenza from Nasopharyngeal swab specimens and  should not be used as a sole basis for treatment. Nasal washings and  aspirates are unacceptable for Xpert Xpress SARS-CoV-2/FLU/RSV  testing. Fact Sheet for Patients: PinkCheek.be Fact Sheet for Healthcare  Providers: GravelBags.it This test is not yet approved or cleared by the [place] FDA and  has been authorized for detection and/or diagnosis of SARS-CoV-2 by  FDA under an Emergency Use Authorization (EUA). This EUA will remain  in effect (meaning this test can be used) for the duration of the  Covid-19 declaration under Section 564(b)(1) of the Act, 21  U.S.C. section 360bbb-3(b)(1), unless the authorization is  terminated or revoked. Performed at [facility], [place] [address] [place], [place] [address]   Blood culture (routine x 2)     Status: None   Collection Time: 05/17/19  3:49 PM   Specimen: BLOOD  Result Value Ref Range Status   Specimen Description BLOOD RIGHT ANTECUBITAL  Final   Special Requests   Final    BOTTLES DRAWN AEROBIC AND ANAEROBIC Blood Culture results may not be optimal due to an inadequate volume of blood received in culture bottles   Culture   Final    NO GROWTH 5 DAYS Performed at [facility], [place] [address] [place], [place] [address]    Report Status 05/22/2019 FINAL  Final  Blood culture (  routine x 2)     Status: None   Collection Time: 05/17/19  4:09 PM   Specimen: BLOOD RIGHT HAND  Result Value Ref Range Status   Specimen Description BLOOD RIGHT HAND  Final   Special Requests   Final    BOTTLES DRAWN AEROBIC AND ANAEROBIC Blood Culture adequate volume   Culture   Final    NO GROWTH 5 DAYS Performed at [facility], [address], [place], [place] [address]    Report Status 05/22/2019 FINAL  Final  Urine Culture     Status: Abnormal   Collection Time: 05/18/19 12:28 AM   Specimen: Urine, Random  Result Value Ref Range Status   Specimen Description URINE, RANDOM  Final   Special Requests   Final    NONE Performed at [facility], [place] [address] [place], [place] [address]    Culture MULTIPLE SPECIES PRESENT, SUGGEST RECOLLECTION (A)  Final   Report Status 05/19/2019 FINAL  Final   MRSA PCR Screening     Status: None   Collection Time: 05/18/19 11:43 PM   Specimen: Nasopharyngeal  Result Value Ref Range Status   MRSA by PCR NEGATIVE NEGATIVE Final    Comment:        The GeneXpert MRSA Assay (FDA approved for NASAL specimens only), is one component of a comprehensive MRSA colonization surveillance program. It is not intended to diagnose MRSA infection nor to guide or monitor treatment for MRSA infections. Performed at [facility], [place] [address] [place], [place] [address]      Labs: BNP (last 3 results) Recent Labs    05/17/19 1608  BNP 0000000*   Basic Metabolic Panel: Recent Labs  Lab 05/18/19 0213 05/18/19 0213 05/18/19 1201 05/19/19 0323 05/20/19 0232 05/21/19 0320 05/22/19 0253  NA 138   < > 141 139 140 138 138  K 3.6   < > 3.1* 3.3* 3.7 3.5 3.7  CL 95*  --   --  96* 97* 95* 94*  CO2 31  --   --  36* 32 31 32  GLUCOSE 165*  --   --  109* 92 95 101*  BUN 10  --   --  17 16 17 20   CREATININE 0.94  --   --  0.78 0.73 0.80 0.70  CALCIUM 8.4*  --   --  8.7* 9.1 9.6 9.3  MG  --   --   --  2.0  --   --   --    < > = values in this interval not displayed.   Liver Function Tests: Recent Labs  Lab 05/17/19 1608 05/18/19 0213  AST 52* 26  ALT 47* 39  ALKPHOS 69 56  BILITOT 0.7 0.7  PROT 7.3 6.6  ALBUMIN 3.7 3.2*   Recent Labs  Lab 05/17/19 1608  LIPASE 15   No results for input(s): AMMONIA in the last 168 hours. CBC: Recent Labs  Lab 05/18/19 0213 05/18/19 0213 05/18/19 1201 05/19/19 0323 05/20/19 0232 05/21/19 0320 05/22/19 0702  WBC 13.1*  --   --  19.4* 19.4* 24.3* 21.5*  NEUTROABS 11.5*  --   --  13.9* 13.3* 16.2* 12.0*  HGB 8.7*   < > 9.5* 7.9* 9.3* 10.0* 9.7*  HCT 30.0*   < > 28.0* 26.4* 31.3* 34.4* 33.4*  MCV 67.1*  --   --  66.0* 65.3* 65.5* 66.8*  PLT 449*  --   --  446* 480* 575* 557*   < > =  values in this interval not displayed.   Cardiac Enzymes: No results for input(s): CKTOTAL, CKMB, CKMBINDEX,  TROPONINI in the last 168 hours. BNP: Invalid input(s): POCBNP CBG: No results for input(s): GLUCAP in the last 168 hours. D-Dimer No results for input(s): DDIMER in the last 72 hours. Hgb A1c No results for input(s): HGBA1C in the last 72 hours. Lipid Profile No results for input(s): CHOL, HDL, LDLCALC, TRIG, CHOLHDL, LDLDIRECT in the last 72 hours. Thyroid function studies No results for input(s): TSH, T4TOTAL, T3FREE, THYROIDAB in the last 72 hours.  Invalid input(s): FREET3 Anemia work up No results for input(s): VITAMINB12, FOLATE, FERRITIN, TIBC, IRON, RETICCTPCT in the last 72 hours. Urinalysis    Component Value Date/Time   COLORURINE STRAW (A) 05/18/2019 0021   APPEARANCEUR CLEAR 05/18/2019 0021   LABSPEC 1.016 05/18/2019 0021   PHURINE 6.0 05/18/2019 0021   GLUCOSEU NEGATIVE 05/18/2019 0021   HGBUR NEGATIVE 05/18/2019 0021   BILIRUBINUR NEGATIVE 05/18/2019 0021   BILIRUBINUR Negative 04/24/2017 1032   KETONESUR NEGATIVE 05/18/2019 0021   PROTEINUR 30 (A) 05/18/2019 0021   UROBILINOGEN 0.2 04/24/2017 1032   UROBILINOGEN 1.0 08/18/2015 1144   NITRITE NEGATIVE 05/18/2019 0021   LEUKOCYTESUR SMALL (A) 05/18/2019 0021   Sepsis Labs Invalid input(s): PROCALCITONIN,  WBC,  LACTICIDVEN Microbiology Recent Results (from the past 240 hour(s))  Respiratory Panel by RT PCR (Flu A&B, Covid) - Nasopharyngeal Swab     Status: None   Collection Time: 05/17/19  3:48 PM   Specimen: Nasopharyngeal Swab  Result Value Ref Range Status   SARS Coronavirus 2 by RT PCR NEGATIVE NEGATIVE Final    Comment: (NOTE) SARS-CoV-2 target nucleic acids are NOT DETECTED. The SARS-CoV-2 RNA is generally detectable in upper respiratoy specimens during the acute phase of infection. The lowest concentration of SARS-CoV-2 viral copies this assay can detect is 131 copies/mL. A negative result does not preclude SARS-Cov-2 infection and should not be used as the sole basis for treatment or other  patient management decisions. A negative result may occur with  improper specimen collection/handling, submission of specimen other than nasopharyngeal swab, presence of viral mutation(s) within the areas targeted by this assay, and inadequate number of viral copies (<131 copies/mL). A negative result must be combined with clinical observations, patient history, and epidemiological information. The expected result is Negative. Fact Sheet for Patients:  PinkCheek.be Fact Sheet for Healthcare Providers:  GravelBags.it This test is not yet ap proved or cleared by the [place] FDA and  has been authorized for detection and/or diagnosis of SARS-CoV-2 by FDA under an Emergency Use Authorization (EUA). This EUA will remain  in effect (meaning this test can be used) for the duration of the COVID-19 declaration under Section 564(b)(1) of the Act, 21 U.S.C. section 360bbb-3(b)(1), unless the authorization is terminated or revoked sooner.    Influenza A by PCR NEGATIVE NEGATIVE Final   Influenza B by PCR NEGATIVE NEGATIVE Final    Comment: (NOTE) The Xpert Xpress SARS-CoV-2/FLU/RSV assay is intended as an aid in  the diagnosis of influenza from Nasopharyngeal swab specimens and  should not be used as a sole basis for treatment. Nasal washings and  aspirates are unacceptable for Xpert Xpress SARS-CoV-2/FLU/RSV  testing. Fact Sheet for Patients: PinkCheek.be Fact Sheet for Healthcare Providers: GravelBags.it This test is not yet approved or cleared by the [place] FDA and  has been authorized for detection and/or diagnosis of SARS-CoV-2 by  FDA under an Emergency Use Authorization (EUA). This EUA will  remain  in effect (meaning this test can be used) for the duration of the  Covid-19 declaration under Section 564(b)(1) of the Act, 21  U.S.C. section 360bbb-3(b)(1), unless  the authorization is  terminated or revoked. Performed at [facility], [place] [address], [place], [place] [address]   Blood culture (routine x 2)     Status: None   Collection Time: 05/17/19  3:49 PM   Specimen: BLOOD  Result Value Ref Range Status   Specimen Description BLOOD RIGHT ANTECUBITAL  Final   Special Requests   Final    BOTTLES DRAWN AEROBIC AND ANAEROBIC Blood Culture results may not be optimal due to an inadequate volume of blood received in culture bottles   Culture   Final    NO GROWTH 5 DAYS Performed at [facility], [place] [address] [place], [place] [address]    Report Status 05/22/2019 FINAL  Final  Blood culture (routine x 2)     Status: None   Collection Time: 05/17/19  4:09 PM   Specimen: BLOOD RIGHT HAND  Result Value Ref Range Status   Specimen Description BLOOD RIGHT HAND  Final   Special Requests   Final    BOTTLES DRAWN AEROBIC AND ANAEROBIC Blood Culture adequate volume   Culture   Final    NO GROWTH 5 DAYS Performed at [facility], [place] [address] [place], [place] [address]    Report Status 05/22/2019 FINAL  Final  Urine Culture     Status: Abnormal   Collection Time: 05/18/19 12:28 AM   Specimen: Urine, Random  Result Value Ref Range Status   Specimen Description URINE, RANDOM  Final   Special Requests   Final    NONE Performed at [facility], [place] [address], [place], [place] [address]    Culture MULTIPLE SPECIES PRESENT, SUGGEST RECOLLECTION (A)  Final   Report Status 05/19/2019 FINAL  Final  MRSA PCR Screening     Status: None   Collection Time: 05/18/19 11:43 PM   Specimen: Nasopharyngeal  Result Value Ref Range Status   MRSA by PCR NEGATIVE NEGATIVE Final    Comment:        The GeneXpert MRSA Assay (FDA approved for NASAL specimens only), is one component of a comprehensive MRSA colonization surveillance program. It is not intended to diagnose MRSA infection nor to guide or monitor treatment for MRSA  infections. Performed at [facility], [place] [address], [place], [place] [address]    Time spent: 30 min  SIGNED:   Marylu Lund, MD  [facility] 05/22/2019, 3:28 PM  If 7PM-7AM, please contact night-coverage

## 2019-05-22 NOTE — Progress Notes (Signed)
SATURATION QUALIFICATIONS: (This note is used to comply with regulatory documentation for home oxygen)  Patient Saturations on Room Air at Rest = 90%  Patient Saturations on Room Air while Ambulating = 84%  Patient Saturations on 4 Liters of oxygen while Ambulating = 88%  Please briefly explain why patient needs home oxygen:Pt needs O2 with activity to maintain sats > 88%.   Full note to follow.  Jymir Dunaj W,PT Acute Rehabilitation Services Pager:  [phone]  Office:  [phone]

## 2019-05-22 NOTE — Evaluation (Signed)
Physical Therapy Evaluation and D/C Patient Details Name: Teresa Mendez MRN: [id number] DOB: 07/14/1975 Today's Date: 05/22/2019   History of Present Illness  44 y.o. female with history of HTN, asthma, sarcoidosis presents to the ER because of worsening shortness of breath ongoing for last 1 week which started off after patient attended funeral.  Patient had some sinus discharge which gradually worsened to cough and shortness of breath.  Patient has not had any chest pain until patient reached the ER which lasted a few minutes and resolved without any intervention. In the ER patient was requiring BiPAP to maintain sats.  CT angiogram of the chest was negative for pulmonary embolism but does show bilateral groundglass opacities.   Clinical Impression  Pt admitted with above diagnosis. Pt desat with activity as below. Will need home O2.  Pt independent with activity with min challenges and will not need equipment for home or follow up therapy. Pt will not need skilled PT at home at this time. Will sign off.     SATURATION QUALIFICATIONS: (This note is used to comply with regulatory documentation for home oxygen)  Patient Saturations on Room Air at Rest = 90%  Patient Saturations on Room Air while Ambulating = 84%  Patient Saturations on 4 Liters of oxygen while Ambulating = 88%  Please briefly explain why patient needs home oxygen:Pt needs O2 with activity to maintain sats > 88%.    Follow Up Recommendations No PT follow up;Supervision - Intermittent    Equipment Recommendations  Other (comment)(home O2)    Recommendations for Other Services       Precautions / Restrictions Precautions Precautions: Fall Restrictions Weight Bearing Restrictions: No      Mobility  Bed Mobility Overal bed mobility: Independent                Transfers Overall transfer level: Independent                  Ambulation/Gait Ambulation/Gait assistance: Independent Gait Distance  (Feet): 350 Feet Assistive device: None Gait Pattern/deviations: Step-through pattern;Decreased stride length   Gait velocity interpretation: 1.31 - 2.62 ft/sec, indicative of limited community ambulator General Gait Details: Pt able to walk on unit without device with good balance with min challenges.  Pt does desat and needs 4LO2 with ambulation to maintain sats >88%.    Stairs            Wheelchair Mobility    Modified Rankin (Stroke Patients Only)       Balance Overall balance assessment: Needs assistance Sitting-balance support: No upper extremity supported;Feet supported Sitting balance-Leahy Scale: Fair     Standing balance support: No upper extremity supported;During functional activity Standing balance-Leahy Scale: Fair Standing balance comment: does not need UE support for balance                             Pertinent Vitals/Pain Pain Assessment: No/denies pain    Home Living Family/patient expects to be discharged to:: Private residence Living Arrangements: Children(daughter) Available Help at Discharge: Family;Available 24 hours/day Type of Home: Apartment Home Access: Level entry     Home Layout: One level Home Equipment: None      Prior Function Level of Independence: Independent         Comments: bathed and dressed self     Hand Dominance   Dominant Hand: Right    Extremity/Trunk Assessment   Upper Extremity Assessment Upper Extremity Assessment: Defer  to OT evaluation    Lower Extremity Assessment Lower Extremity Assessment: Generalized weakness    Cervical / Trunk Assessment Cervical / Trunk Assessment: Normal  Communication   Communication: No difficulties  Cognition Arousal/Alertness: Awake/alert Behavior During Therapy: WFL for tasks assessed/performed Overall Cognitive Status: Within Functional Limits for tasks assessed                                        General Comments General  comments (skin integrity, edema, etc.): 96% on 2L at rest, 113 bpm to 138 bpm    Exercises     Assessment/Plan    PT Assessment Patent does not need any further PT services  PT Problem List         PT Treatment Interventions      PT Goals (Current goals can be found in the Care Plan section)  Acute Rehab PT Goals Patient Stated Goal: to go home today PT Goal Formulation: All assessment and education complete, DC therapy    Frequency     Barriers to discharge        Co-evaluation               AM-PAC PT "6 Clicks" Mobility  Outcome Measure Help needed turning from your back to your side while in a flat bed without using bedrails?: None Help needed moving from lying on your back to sitting on the side of a flat bed without using bedrails?: None Help needed moving to and from a bed to a chair (including a wheelchair)?: None Help needed standing up from a chair using your arms (e.g., wheelchair or bedside chair)?: None Help needed to walk in hospital room?: None Help needed climbing 3-5 steps with a railing? : None 6 Click Score: 24    End of Session Equipment Utilized During Treatment: Gait belt;Oxygen Activity Tolerance: Patient limited by fatigue Patient left: in bed;with call bell/phone within reach(sitting EOB) Nurse Communication: Mobility status PT Visit Diagnosis: Muscle weakness (generalized) (M62.81)    Time: [id number] PT Time Calculation (min) (ACUTE ONLY): 16 min   Charges:   PT Evaluation $PT Eval Low Complexity: [place] W,PT Acute Rehabilitation Services Pager:  [phone]  Office:  [place] 05/22/2019, 3:01 PM

## 2019-05-22 NOTE — Plan of Care (Signed)
  Problem: Education: Goal: Knowledge of General Education information will improve Description: Including pain rating scale, medication(s)/side effects and non-pharmacologic comfort measures Outcome: Progressing   Problem: Health Behavior/Discharge Planning: Goal: Ability to manage health-related needs will improve Outcome: Progressing   Problem: Clinical Measurements: Goal: Ability to maintain clinical measurements within normal limits will improve Outcome: Progressing Goal: Will remain free from infection Outcome: Progressing Goal: Diagnostic test results will improve Outcome: Progressing Goal: Respiratory complications will improve Outcome: Progressing Goal: Cardiovascular complication will be avoided Outcome: Progressing   Problem: Activity: Goal: Risk for activity intolerance will decrease Outcome: Progressing   Problem: Nutrition: Goal: Adequate nutrition will be maintained Outcome: Progressing   Problem: Elimination: Goal: Will not experience complications related to bowel motility Outcome: Progressing Goal: Will not experience complications related to urinary retention Outcome: Progressing   Problem: Pain Managment: Goal: General experience of comfort will improve Outcome: Progressing   Problem: Skin Integrity: Goal: Risk for impaired skin integrity will decrease Outcome: Progressing

## 2019-05-22 NOTE — TOC Initial Note (Signed)
Transition of Care [facility]) - Initial/Assessment Note    Patient Details  Name: Teresa Mendez MRN: [id number] Date of Birth: 1975/10/31  Transition of [facility]) CM/SW Contact:    Zenon Mayo, RN Phone Number: 05/22/2019, 3:52 PM  Clinical Narrative:                 NCM spoke with patient, she is for dc today, she will need home oxygen, she states it is ok to use [employer], NCM made referral to [facility] with [facility] for home oxygen. This will be brought down to patient 's room prior to dc.  Patient has transportation at dc, she has no issues getting her medications.   Expected Discharge Plan: Home/Self Care Barriers to Discharge: No Barriers Identified   Patient Goals and CMS Choice Patient states their goals for this hospitalization and ongoing recovery are:: get well      Expected Discharge Plan and Services Expected Discharge Plan: Home/Self Care In-house Referral: NA Discharge Planning Services: CM Consult Post Acute Care Choice: Durable Medical Equipment Living arrangements for the past 2 months: Single Family Home Expected Discharge Date: 05/22/19               DME Arranged: Oxygen DME Agency: [employer] Date DME Agency Contacted: 05/22/19 Time DME Agency Contacted: [id number] Representative spoke with at DME Agency: Gwinda Passe Sequoyah Arranged: NA          Prior Living Arrangements/Services Living arrangements for the past 2 months: [place] Lives with:: Adult Children Patient language and need for interpreter reviewed:: Yes Do you feel safe going back to the place where you live?: Yes      Need for Family Participation in Patient Care: Yes (Comment) Care giver support system in place?: Yes (comment)   Criminal Activity/Legal Involvement Pertinent to Current Situation/Hospitalization: No - Comment as needed  Activities of Daily Living Home Assistive Devices/Equipment: None ADL Screening (condition at time of admission) Patient's cognitive ability adequate to  safely complete daily activities?: Yes Is the patient deaf or have difficulty hearing?: No Does the patient have difficulty seeing, even when wearing glasses/contacts?: No Does the patient have difficulty concentrating, remembering, or making decisions?: No Patient able to express need for assistance with ADLs?: Yes Does the patient have difficulty dressing or bathing?: No Independently performs ADLs?: Yes (appropriate for developmental age) Does the patient have difficulty walking or climbing stairs?: No Weakness of Legs: None Weakness of Arms/Hands: None  Permission Sought/Granted                  Emotional Assessment Appearance:: Appears stated age Attitude/Demeanor/Rapport: Engaged Affect (typically observed): Appropriate Orientation: : Oriented to Self, Oriented to Place, Oriented to  Time, Oriented to Situation Alcohol / Substance Use: Not Applicable Psych Involvement: No (comment)  Admission diagnosis:  Wheezing [R06.2] COPD exacerbation (HCC) [J44.1] Elevated brain natriuretic peptide (BNP) level [R79.89] Acute respiratory failure with hypoxia (HCC) [J96.01] Hypervolemia, unspecified hypervolemia type [E87.70] Community acquired pneumonia, unspecified laterality [J18.9] Patient Active Problem List   Diagnosis Date Noted  . Hypertensive urgency 05/18/2019  . Chest pain 05/18/2019  . Community acquired pneumonia   . Acute respiratory failure with hypoxia ([place]) 05/17/2019  . 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 Essential hypertension 06/27/2007  . DYSPNEA 06/27/2007   PCP:  [name], NP Pharmacy:   [facility], [place] [address] [place] [address] Phone: [phone] Fax: [phone]  [id number] [place], [place] [address] [place] [address] Phone: [phone] Fax: [phone]  [facility] [place], [place] - [address] [place] [address] Phone: [phone] Fax: [phone]     Social Determinants of Health (SDOH) Interventions    Readmission Risk Interventions No flowsheet data found.

## 2019-05-22 NOTE — Progress Notes (Signed)
Oxygen delivered to the room, discharge instructions given to pt.Belongings with pt.

## 2019-05-25 ENCOUNTER — Other Ambulatory Visit: Payer: Self-pay | Admitting: Family Medicine

## 2019-05-25 ENCOUNTER — Telehealth: Payer: Self-pay

## 2019-05-25 ENCOUNTER — Other Ambulatory Visit: Payer: Self-pay | Admitting: Nurse Practitioner

## 2019-05-25 DIAGNOSIS — D869 Sarcoidosis, unspecified: Secondary | ICD-10-CM | POA: Diagnosis not present

## 2019-05-25 DIAGNOSIS — I10 Essential (primary) hypertension: Secondary | ICD-10-CM

## 2019-05-25 DIAGNOSIS — J189 Pneumonia, unspecified organism: Secondary | ICD-10-CM | POA: Diagnosis not present

## 2019-05-25 DIAGNOSIS — J45909 Unspecified asthma, uncomplicated: Secondary | ICD-10-CM | POA: Diagnosis not present

## 2019-05-25 MED FILL — AMLODIPINE BESYLATE 10 MG T: 10 | 90 days supply | Qty: 90 | Fill #1

## 2019-05-25 NOTE — Telephone Encounter (Signed)
Transition Care Management Follow-up Telephone Call  Date of discharge and from where: 05/22/2019, [facility]   How have you been since you were released from the hospital? She stated she is feeling weak.  Any questions or concerns?  concerns regarding medications noted below.  No other questions/concerns.   Items Reviewed:  Did the pt receive and understand the discharge instructions provided?  yes  Medications obtained and verified?  medication list reviewed with patient. She needs refills of albuterol nebulizer solution, amlodipine, symbicort, hydrochlorothiazide and lisinopril.  She said she has the other medications noted on her list and has completed the course of cefdinir. She said she has 2 hydrochlorothiazide pills left but not any of the other BP meds.  She stated that they gave her Memory Dance in the hospital and she is not sure if she should take that instead of the symbicort. She has no symbicort.  She stated that she understands the medications that she is to stop taking and is not sure what she should be taking for back pain in place of the flexeril. She has not been able to have the lower back imaging done yet.  Informed her that her provider would be notified of her medication needs. She had no other questions about the medications.   Any new allergies since your discharge?  none reported   Do you have support at home?she said that her daughters are with her.   Other (ie: DME, Home Health, etc) no home health ordered.  Has O2 from [place].  Stated she is using continuously  - 3L when sitting and 4-5 L when walking  She explained those are the instructions that she received from the hospital. She confirmed that she does need to increase it when ambulating.  Has BP monitor   Has nebulizer but stated that she needs a new mask.  She prefers a new mask instead of a mouthpiece.    Does not have an oximeter  Functional Questionnaire: (I = Independent and D =  Dependent) ADL's:indpendent. Daughters assist as needed   Follow up appointments reviewed:    PCP Hospital f/u appt confirmed? scheduled with Ms Tama Gander, [place] 06/03/2019 @ [facility] f/u appt confirmed? appoinmtent with pulmonary - 05/29/2019.  Are transportation arrangements needed?  no ,she said she has transportation.   If their condition worsens, is the pt aware to call  their PCP or go to the ED?  yes  Was the patient provided with contact information for the PCP's office or ED? she has the clinic phone number  Was the pt encouraged to call back with questions or concerns? yes

## 2019-05-26 MED FILL — LISINOPRIL 40 MG TABLET: 40 | 30 days supply | Qty: 30 | Fill #0

## 2019-05-26 MED FILL — HYDROCHLOROTHIAZIDE 25 MG T: 25 | 30 days supply | Qty: 30 | Fill #0

## 2019-05-27 ENCOUNTER — Other Ambulatory Visit: Payer: Self-pay | Admitting: Nurse Practitioner

## 2019-05-27 DIAGNOSIS — I10 Essential (primary) hypertension: Secondary | ICD-10-CM

## 2019-05-27 DIAGNOSIS — D869 Sarcoidosis, unspecified: Secondary | ICD-10-CM

## 2019-05-27 DIAGNOSIS — J45909 Unspecified asthma, uncomplicated: Secondary | ICD-10-CM

## 2019-05-27 MED ORDER — ALBUTEROL SULFATE HFA 108 (90 BASE) MCG/ACT IN AERS: 1.0000 | INHALATION_SPRAY | Freq: Four times a day (QID) | RESPIRATORY_TRACT | 0 refills | Status: DC | PRN

## 2019-05-27 MED ORDER — MASKS MISC: 0 refills | Status: DC

## 2019-05-27 MED ORDER — BUDESONIDE-FORMOTEROL FUMARATE 160-4.5 MCG/ACT IN AERO: 2.0000 | INHALATION_SPRAY | Freq: Two times a day (BID) | RESPIRATORY_TRACT | 3 refills | Status: DC

## 2019-05-27 MED ORDER — DULOXETINE HCL 30 MG PO CPEP: 30.0000 mg | ORAL_CAPSULE | Freq: Every day | ORAL | 0 refills | Status: DC

## 2019-05-27 MED ORDER — ALBUTEROL SULFATE (2.5 MG/3ML) 0.083% IN NEBU: 2.5000 mg | INHALATION_SOLUTION | Freq: Four times a day (QID) | RESPIRATORY_TRACT | 2 refills | Status: DC | PRN

## 2019-05-27 MED ORDER — AMLODIPINE BESYLATE 10 MG PO TABS: 10.0000 mg | ORAL_TABLET | Freq: Every day | ORAL | 1 refills | Status: DC

## 2019-05-27 MED FILL — DULoxetine HCL 30 MG CPEP: 30 | 90 days supply | Qty: 90 | Fill #0

## 2019-05-27 MED FILL — ALBUTEROL SUL 2.5 MG/3 ML S: (2.5 MG/3ML | 12 days supply | Qty: 150 | Fill #0

## 2019-05-27 MED FILL — SYMBICORT 160-4.5 MCG INH: 160-4.5 | 30 days supply | Qty: 10 | Fill #0

## 2019-05-29 ENCOUNTER — Telehealth: Payer: Self-pay | Admitting: Primary Care

## 2019-05-29 ENCOUNTER — Ambulatory Visit (INDEPENDENT_AMBULATORY_CARE_PROVIDER_SITE_OTHER): Payer: Self-pay | Admitting: Primary Care

## 2019-05-29 ENCOUNTER — Other Ambulatory Visit: Payer: Self-pay

## 2019-05-29 ENCOUNTER — Encounter: Payer: Self-pay | Admitting: Primary Care

## 2019-05-29 VITALS — BP 126/64 | HR 105 | Temp 97.8°F | Ht 64.0 in | Wt 249.6 lb

## 2019-05-29 DIAGNOSIS — J45909 Unspecified asthma, uncomplicated: Secondary | ICD-10-CM

## 2019-05-29 DIAGNOSIS — J189 Pneumonia, unspecified organism: Secondary | ICD-10-CM

## 2019-05-29 DIAGNOSIS — D869 Sarcoidosis, unspecified: Secondary | ICD-10-CM

## 2019-05-29 DIAGNOSIS — J9601 Acute respiratory failure with hypoxia: Secondary | ICD-10-CM

## 2019-05-29 MED ORDER — LORATADINE 10 MG PO TABS
10.0000 mg | ORAL_TABLET | Freq: Every day | ORAL | 6 refills | Status: DC
Start: 2019-05-29 — End: 2020-03-04

## 2019-05-29 MED FILL — LORATADINE 10 MG TABLET: 10 | 30 days supply | Qty: 30 | Fill #0

## 2019-05-29 NOTE — Patient Instructions (Addendum)
Asthma/COPD: Resume Claritin 10mg  daily  Resume Breo one puff daily (finish inhaler)  Respiratory failure: Continue 3L oxygen to keep O2 >90% Get pulse oximeter to monitor oxygen levels  Orders: Pulmonary function testing  Orders: CT chest wo contrast in 4 weeks re: CAP   Follow-up: Dr. Carlis Abbott in 4 weeks    Home Oxygen Use, Adult When a medical condition keeps you from getting enough oxygen, your health care provider may instruct you to take extra oxygen at home. Your health care provider will let you know:  When to take oxygen.  For how long to take oxygen.  How quickly oxygen should be delivered (flow rate), in liters per minute (LPM or L/M). Home oxygen can be given through:  A mask.  A nasal cannula. This is a device or tube that goes in the nostrils.  A transtracheal catheter. This is a small, flexible tube placed in the trachea.  A tracheostomy. This is a surgically made opening in the trachea. These devices are connected with tubing to an oxygen source, such as:  A tank. Tanks hold oxygen in gas form. They must be replaced when the oxygen is used up.  A liquid oxygen device. This holds oxygen in liquid form. It must be replaced when the oxygen is used up.  An oxygen concentrator machine. This filters oxygen in the room. It uses electricity, so you must have a backup cylinder of oxygen in case the power goes out. Supplies needed: To use oxygen, you will need:  A mask, nasal cannula, transtracheal catheter, or tracheostomy.  An oxygen tank, a liquid oxygen device, or an oxygen concentrator.  The tape that your health care provider recommends (optional). If you use a transtracheal catheter and your prescribed flow rate is 1 LPM or greater, you will also need a humidifier. Risks and complications  Fire. This can happen if the oxygen is exposed to a heat source, flame, or spark.  Injury to skin. This can happen if liquid oxygen touches your skin.  Organ  damage. This can happen if you get too little oxygen. How to use oxygen Your health care provider or a representative from your [employer] will show you how to use your oxygen device. Follow her or his instructions. The instructions may look something like this: 1. Wash your hands. 2. If you use an oxygen concentrator, make sure it is plugged in. 3. Place one end of the tube into the port on the tank, device, or machine. 4. Place the mask over your nose and mouth. Or, place the nasal cannula and secure it with tape if instructed. If you use a tracheostomy or transtracheal catheter, connect it to the oxygen source as directed. 5. Make sure the liter-flow setting on the machine is at the level prescribed by your health care provider. 6. Turn on the machine or adjust the knob on the tank or device to the correct liter-flow setting. 7. When you are done, turn off and unplug the machine, or turn the knob to OFF. How to clean and care for the oxygen supplies Nasal cannula  Clean it with a warm, wet cloth daily or as needed.  Wash it with a liquid soap once a week.  Rinse it thoroughly once or twice a week.  Replace it every 2-4 weeks.  If you have an infection, such as a cold or pneumonia, change the cannula when you get better. Mask  Replace it every 2-4 weeks.  If you have an infection,  such as a cold or pneumonia, change the mask when you get better. Humidifier bottle  Wash the bottle between each refill: ? Wash it with soap and warm water. ? Rinse it thoroughly. ? Disinfect it and its top. ? Air-dry it.  Make sure it is dry before you refill it. Oxygen concentrator  Clean the air filter at least twice a week according to directions from your home medical equipment and service company.  Wipe down the cabinet every day. To do this: ? Unplug the unit. ? Wipe down the cabinet with a damp cloth. ? Dry the cabinet. Other equipment  Change any extra tubing every 1-3  months.  Follow instructions from your health care provider about taking care of any other equipment. Safety tips Fire safety tips   Keep your oxygen and oxygen supplies at least 5 ft away from sources of heat, flames, and sparks at all times.  Do not allow smoking near your oxygen. Put up "no smoking" signs in your home. Avoid smoking areas when in public.  Do not use materials that can burn (are flammable) while you use oxygen.  When you go to a restaurant with portable oxygen, ask to be seated in the nonsmoking section.  Keep a Data processing manager close by. Let your fire department know that you have oxygen in your home.  Test your home smoke detectors regularly. Traveling  Secure your oxygen tank in the vehicle so that it does not move around. Follow instructions from your medical device company about how to safely secure your tank.  Make sure you have enough oxygen for the amount of time you will be away from home.  If you are planning air travel, contact the airline to find out if they allow the use of an approved portable oxygen concentrator. You may also need documents from your health care provider and medical device company before you travel. General safety tips  If you use an oxygen cylinder, make sure it is in a stand or secured to an object that will not move (fixed object).  If you use liquid oxygen, make sure its container is kept upright.  If you use an oxygen concentrator: ? Dance movement psychotherapist company. Make sure you are given priority service in the event that your power goes out. ? Avoid using extension cords, if possible. Follow these instructions at home:  Use oxygen only as told by your health care provider.  Do not use alcohol or other drugs that make you relax (sedating drugs) unless instructed. They can slow down your breathing rate and make it hard to get in enough oxygen.  Know how and when to order a refill of oxygen.  Always keep a spare tank of  oxygen. Plan ahead for holidays when you may not be able to get a prescription filled.  Use water-based lubricants on your lips or nostrils. Do not use oil-based products like petroleum jelly.  To prevent skin irritation on your cheeks or behind your ears, tuck some gauze under the tubing. Contact a health care provider if:  You get headaches often.  You have shortness of breath.  You have a lasting cough.  You have anxiety.  You are sleepy all the time.  You develop an illness that affects your breathing.  You cannot exercise at your regular level.  You are restless.  You have difficult or irregular breathing, and it is getting worse.  You have a fever.  You have persistent redness under your nose. Get  help right away if:  You are confused.  You have blue lips or fingernails.  You are struggling to breathe. Summary  Your health care provider or a representative from your [employer] will show you how to use your oxygen device. Follow her or his instructions.  If you use an oxygen concentrator, make sure it is plugged in.  Make sure the liter-flow setting on the machine is at the level prescribed by your health care provider.  Keep your oxygen and oxygen supplies at least 5 ft away from sources of heat, flames, and sparks at all times. This information is not intended to replace advice given to you by your health care provider. Make sure you discuss any questions you have with your health care provider. Document Revised: 07/04/2017 Document Reviewed: 08/09/2015 Elsevier Patient Education  [place].

## 2019-05-29 NOTE — Progress Notes (Signed)
@Patient  ID: Teresa Mendez, female    DOB: 1976-01-26, 44 y.o.   MRN: [id number]  Chief Complaint  Patient presents with  . Hospitalization Follow-up    Referring provider: [name], NP  HPI: 44 year old female, former smoker. PMH significant for asthma, emphysema, pulmonary sarcoid. Former patient Dr. [name], recently hospitalized for acute respiratory failure secondary to possible asthma exacerbation, pulmonary edema and community acquired pneumonia. Consulted by PCCM/ Dr. Loanne Drilling.   Hospital course: Presented to ED with worsening shortness of breath x 1 week, Chest pain in ED. BP >230/130. BNP 241. Trop 58. WBC 19.4. Given lasix and started on nitroglycerin infusion. Requiring BIPAP to maintain oxygen saturation. CTA showed ground glass opacity left lower lobe, emphysema. Treated with ceftriaxone, azithromycin and omnicef. S/p solumedrol, completed prednisone. Discharged on 3L oxygen.  05/29/2019  Patient presents today for hospital follow-up. She is doing well, feels her breathing has improved a great deal. Completed antibiotics. Continues to use home oxygen 3L. Denies acute cough or wheezing. She is not currently using Symbicort or Breo inhaler. States that she was not told to continue when she was discharged. Cost is an issue. She has BREO inhaler at home from her hospital stay. States that pollen/wind makes her asthma symptoms worse. Reports this will cause throat irritation, post nasal drip and wheezing. Uses her Albuterol inhaler once a day. She is not currently taking an antihistamine.   No Known Allergies  There is no immunization history for the selected administration types on file for this patient.  Past Medical History:  Diagnosis Date  . Asthma   . Hypertension   . Sarcoidosis   . Sickle cell trait ([place])     Tobacco History: Social History   Tobacco Use  Smoking Status Former Smoker  . Packs/day: 0.50  . Types: Cigarettes  Smokeless Tobacco Never Used   Tobacco Comment   Pt. stated she quit a few months back.    Counseling given: Not Answered Comment: Pt. stated she quit a few months back.    Outpatient Medications Prior to Visit  Medication Sig Dispense Refill  . albuterol (PROVENTIL) (2.5 MG/3ML) 0.083% nebulizer solution Take 3 mLs (2.5 mg total) by nebulization every 6 (six) hours as needed for wheezing or shortness of breath. 150 mL 2  . albuterol (VENTOLIN HFA) 108 (90 Base) MCG/ACT inhaler Inhale 1-2 puffs into the lungs every 6 (six) hours as needed for wheezing or shortness of breath (cough). 8.5 g 0  . amLODipine (NORVASC) 10 MG tablet Take 1 tablet (10 mg total) by mouth daily. 90 tablet 1  . aspirin 81 MG chewable tablet Chew 1 tablet (81 mg total) by mouth daily. 30 tablet 0  . docusate sodium (COLACE) 100 MG capsule Take 1 capsule (100 mg total) by mouth 2 (two) times daily. 60 capsule 0  . DULoxetine (CYMBALTA) 30 MG capsule Take 1 capsule (30 mg total) by mouth daily. 90 capsule 0  . ferrous sulfate 325 (65 FE) MG tablet Take 1 tablet (325 mg total) by mouth daily with breakfast. 30 tablet 0  . hydrochlorothiazide (HYDRODIURIL) 25 MG tablet TAKE 1 TABLET (25 MG TOTAL) BY MOUTH DAILY. 30 tablet 2  . lisinopril (ZESTRIL) 40 MG tablet TAKE 1 TABLET (40 MG TOTAL) BY MOUTH DAILY. 30 tablet 2  . Masks MISC Nebulizer face mask [id number] 1 each 0  . budesonide-formoterol (SYMBICORT) 160-4.5 MCG/ACT inhaler Inhale 2 puffs into the lungs 2 (two) times daily. 1 Inhaler 3  No facility-administered medications prior to visit.    Review of Systems  Review of Systems  HENT: Positive for postnasal drip.   Respiratory: Positive for wheezing. Negative for cough and shortness of breath.   Cardiovascular: Negative.    Physical Exam  BP 126/64 (BP Location: Right Arm, Cuff Size: Normal)   Pulse (!) 105   Temp 97.8 F (36.6 C) (Temporal)   Ht 5\' 4"  (1.626 m)   Wt 249 lb 9.6 oz (113.2 kg)   SpO2 98%   BMI 42.84 kg/m  Physical  Exam Constitutional:      General: She is not in acute distress.    Appearance: Normal appearance. She is obese. She is not ill-appearing.  HENT:     Head: Normocephalic and atraumatic.     Mouth/Throat:     Mouth: Mucous membranes are moist.     Pharynx: Oropharynx is clear.  Cardiovascular:     Rate and Rhythm: Normal rate and regular rhythm.  Pulmonary:     Effort: Pulmonary effort is normal. No respiratory distress.     Breath sounds: No wheezing, rhonchi or rales.     Comments: CTA; 3L oxygen  Skin:    General: Skin is warm and dry.  Neurological:     General: No focal deficit present.     Mental Status: She is alert. Mental status is at baseline.  Psychiatric:        Mood and Affect: Mood normal.        Behavior: Behavior normal.        Thought Content: Thought content normal.        Judgment: Judgment normal.      Lab Results:  CBC    Component Value Date/Time   WBC 21.5 (H) 05/22/2019 0702   RBC 5.00 05/22/2019 0702   HGB 9.7 (L) 05/22/2019 0702   HGB 9.5 (L) 02/07/2018 1458   HCT 33.4 (L) 05/22/2019 0702   HCT 31.2 (L) 02/07/2018 1458   PLT 557 (H) 05/22/2019 0702   PLT 432 02/07/2018 1458   MCV 66.8 (L) 05/22/2019 0702   MCV 70 (L) 02/07/2018 1458   MCH 19.4 (L) 05/22/2019 0702   MCHC 29.0 (L) 05/22/2019 0702   RDW 19.5 (H) 05/22/2019 0702   RDW 16.0 (H) 02/07/2018 1458   LYMPHSABS 8.2 (H) 05/22/2019 0702   MONOABS 0.4 05/22/2019 0702   EOSABS 0.6 (H) 05/22/2019 0702   BASOSABS 0.2 (H) 05/22/2019 0702    BMET    Component Value Date/Time   NA 138 05/22/2019 0253   NA 143 02/07/2018 1458   K 3.7 05/22/2019 0253   CL 94 (L) 05/22/2019 0253   CO2 32 05/22/2019 0253   GLUCOSE 101 (H) 05/22/2019 0253   BUN 20 05/22/2019 0253   BUN 15 02/07/2018 1458   CREATININE 0.70 05/22/2019 0253   CALCIUM 9.3 05/22/2019 0253   GFRNONAA >60 05/22/2019 0253   GFRAA >60 05/22/2019 0253    BNP    Component Value Date/Time   BNP 243.1 (H) 05/17/2019 1608     ProBNP No results found for: PROBNP  Imaging: CT Angio Chest PE W and/or Wo Contrast  Result Date: 05/17/2019 CLINICAL DATA:  Sarcoidosis, asthma.  Positive D-dimer. EXAM: CT ANGIOGRAPHY CHEST WITH CONTRAST TECHNIQUE: Multidetector CT imaging of the chest was performed using the standard protocol during bolus administration of intravenous contrast. Multiplanar CT image reconstructions and MIPs were obtained to evaluate the vascular anatomy. CONTRAST:  90mL OMNIPAQUE IOHEXOL 350 MG/ML SOLN  COMPARISON:  06/10/2007 FINDINGS: Cardiovascular: No filling defects in the pulmonary arteries to suggest pulmonary emboli. Heart is normal size. Aorta is normal caliber. Mediastinum/Nodes: No mediastinal, hilar, or axillary adenopathy. Trachea and esophagus are unremarkable. Thyroid unremarkable. Lungs/Pleura: Large bulla in the upper lobes bilaterally. Scarring in the left lower lobe. Ground-glass airspace opacity in the left lower lobe at the left lung base. No effusions. Upper Abdomen: Imaging into the upper abdomen shows no acute findings. Musculoskeletal: Chest wall soft tissues are unremarkable. No acute bony abnormality. Review of the MIP images confirms the above findings. 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 Ground-glass opacity posterolaterally at the left lung base could reflect early infiltrate/pneumonia. No evidence of pulmonary embolus. Electronically Signed   By: Rolm Baptise M.D.   On: 05/17/2019 20:21   DG Chest Portable 1 View  Result Date: 05/17/2019 CLINICAL DATA:  Respiratory distress, cough. EXAM: PORTABLE CHEST 1 VIEW COMPARISON:  November 08, 2015. FINDINGS: The heart size and mediastinal contours are within normal limits. No pneumothorax or pleural effusion is noted. Bleb is noted in right upper lobe. Lungs are otherwise clear given overlying soft tissue artifact. The visualized skeletal structures are unremarkable. IMPRESSION: No active disease. Electronically  Signed   By: Marijo Conception M.D.   On: 05/17/2019 16:37   ECHOCARDIOGRAM COMPLETE  Result Date: 05/18/2019    ECHOCARDIOGRAM REPORT   Patient Name:   LASTACIA SLISZ Date of Exam: 05/18/2019 Medical Rec #:  [id number]      Height:       64.0 in Accession #:    [id number]     Weight:       254.0 lb Date of Birth:  12-02-1975       BSA:          2.166 m Patient Age:    67 years       BP:           143/89 mmHg Patient Gender: F              HR:           107 bpm. Exam Location:  Inpatient Procedure: 2D Echo, Cardiac Doppler and Color Doppler Indications:    R60.0 Lower extremity edema  History:        Patient has no prior history of Echocardiogram examinations.                 Risk Factors:Hypertension. Sickle Cell Trait. Sarcoidosis.  Sonographer:    Jonelle Sidle Dance Referring Phys: [place]  1. Left ventricular ejection fraction, by estimation, is 60 to 65%. The left ventricle has normal function. The left ventricle has no regional wall motion abnormalities. There is mild left ventricular hypertrophy. Left ventricular diastolic parameters are indeterminate.  2. Right ventricular systolic function is normal. The right ventricular size is normal.  3. Left atrial size was mildly dilated.  4. The mitral valve is normal in structure. No evidence of mitral valve regurgitation. No evidence of mitral stenosis.  5. The aortic valve is tricuspid. Aortic valve regurgitation is trivial. No aortic stenosis is present.  6. Aortic dilatation noted. Aneurysm of the ascending aorta, measuring 38 mm.  7. The inferior vena cava is normal in size with greater than 50% respiratory variability, suggesting right atrial pressure of 3 mmHg. FINDINGS  Left Ventricle: Left ventricular ejection fraction, by estimation, is 60 to 65%. The left ventricle has normal function. The left ventricle has no regional wall  motion abnormalities. The left ventricular internal cavity size was normal in size. There is  mild left  ventricular hypertrophy. Left ventricular diastolic parameters are indeterminate. Right Ventricle: The right ventricular size is normal. No increase in right ventricular wall thickness. Right ventricular systolic function is normal. Left Atrium: Left atrial size was mildly dilated. Right Atrium: Right atrial size was normal in size. Pericardium: There is no evidence of pericardial effusion. Mitral Valve: The mitral valve is normal in structure. Normal mobility of the mitral valve leaflets. No evidence of mitral valve regurgitation. No evidence of mitral valve stenosis. Tricuspid Valve: The tricuspid valve is normal in structure. Tricuspid valve regurgitation is not demonstrated. No evidence of tricuspid stenosis. Aortic Valve: The aortic valve is tricuspid. Aortic valve regurgitation is trivial. No aortic stenosis is present. Pulmonic Valve: The pulmonic valve was normal in structure. Pulmonic valve regurgitation is not visualized. No evidence of pulmonic stenosis. Aorta: Aortic dilatation noted. There is an aneurysm involving the ascending aorta. The aneurysm measures 38 mm. Venous: The inferior vena cava is normal in size with greater than 50% respiratory variability, suggesting right atrial pressure of 3 mmHg. IAS/Shunts: No atrial level shunt detected by color flow Doppler.  LEFT VENTRICLE PLAX 2D LVIDd:         4.54 cm LVIDs:         3.05 cm LV PW:         1.34 cm LV IVS:        1.20 cm LVOT diam:     1.90 cm LV SV:         54 LV SV Index:   25 LVOT Area:     2.84 cm  RIGHT VENTRICLE             IVC RV Basal diam:  2.88 cm     IVC diam: 1.40 cm RV S prime:     14.50 cm/s TAPSE (M-mode): 2.3 cm LEFT ATRIUM             Index       RIGHT ATRIUM           Index LA diam:        4.30 cm 1.99 cm/m  RA Area:     16.90 cm LA Vol (A2C):   88.9 ml 41.05 ml/m RA Volume:   49.10 ml  22.67 ml/m LA Vol (A4C):   53.6 ml 24.75 ml/m LA Biplane Vol: 72.4 ml 33.43 ml/m  AORTIC VALVE LVOT Vmax:   109.00 cm/s LVOT Vmean:   69.300 cm/s LVOT VTI:    0.189 m  AORTA Ao Root diam: 3.20 cm Ao Asc diam:  3.80 cm MV A velocity: 101.75 cm/s                             SHUNTS                             Systemic VTI:  0.19 m                             Systemic Diam: 1.90 cm Candee Furbish MD Electronically signed by Candee Furbish MD Signature Date/Time: 05/18/2019/10:36:29 AM    Final      Assessment & Plan:   Intrinsic asthma - Resume Breo 200 one puff daily  - Resume Claritin 10mg  daily  -  Continue Albuterol hfa 2 puffs every 6 hours for shortness of breath/wheezing   Acute respiratory failure with hypoxia (HCC) - Continue 3L oxygen to keep O2 >90% - Recommend patient get pulse oximeter to monitor oxygen levels - Ordered for Pulmonary function testing  - FU in 4 weeks to establish with pulmonary MD  Community acquired pneumonia - CTA ground glass opacity left lower lobe, treated with azithromycin and omnicef - Repeat imaging in 4 weeks to ensure resolution     [name], NP 05/30/2019

## 2019-05-29 NOTE — Telephone Encounter (Signed)
Can process a benefits investigation for either Breo 200 or Symbicort 160 for patient. Thanks

## 2019-05-30 DIAGNOSIS — D869 Sarcoidosis, unspecified: Secondary | ICD-10-CM | POA: Diagnosis not present

## 2019-05-30 DIAGNOSIS — J189 Pneumonia, unspecified organism: Secondary | ICD-10-CM | POA: Diagnosis not present

## 2019-05-30 DIAGNOSIS — J45909 Unspecified asthma, uncomplicated: Secondary | ICD-10-CM | POA: Diagnosis not present

## 2019-05-30 NOTE — Assessment & Plan Note (Addendum)
-   Continue 3L oxygen to keep O2 >90% - Recommend patient get pulse oximeter to monitor oxygen levels - Ordered for Pulmonary function testing  - FU in 4 weeks to establish with pulmonary MD

## 2019-05-30 NOTE — Assessment & Plan Note (Signed)
-   Resume Breo 200 one puff daily  - Resume Claritin 10mg  daily  - Continue Albuterol hfa 2 puffs every 6 hours for shortness of breath/wheezing

## 2019-05-30 NOTE — Assessment & Plan Note (Signed)
-   CTA ground glass opacity left lower lobe, treated with azithromycin and omnicef - Repeat imaging in 4 weeks to ensure resolution

## 2019-06-01 DIAGNOSIS — R32 Unspecified urinary incontinence: Secondary | ICD-10-CM | POA: Diagnosis not present

## 2019-06-01 MED ORDER — BUDESONIDE-FORMOTEROL FUMARATE 160-4.5 MCG/ACT IN AERO
2.0000 | INHALATION_SPRAY | Freq: Two times a day (BID) | RESPIRATORY_TRACT | 6 refills | Status: DC
Start: 2019-06-01 — End: 2019-07-22

## 2019-06-01 NOTE — Telephone Encounter (Signed)
Patient has [place] Medicaid- Breo requires prior authorization- patient must have tried 2 preferred before it can be approved: Advair, Symbicort, or Dulera.- All 3 preferred will each have a $3.00 copay for a 1 month supply.

## 2019-06-01 NOTE — Telephone Encounter (Signed)
Notified patient. Sending in prescription for symbicort 160.

## 2019-06-02 NOTE — Progress Notes (Signed)
Patient ID: Teresa Mendez, female   DOB: Jan 27, 1976, 44 y.o.   MRN: [id number]    Teresa Mendez, is a 44 y.o. female  [id number]  [id number]  DOB - 1976-01-06  Subjective:  Chief Complaint and HPI: Teresa Mendez is a 44 y.o. female here today  for a follow up visit After hospitalization 4/18-4/23/2021.  She saw pulmonology on 05/29/2019 ad was kept on 3L O2. Has repeat imaging planned in 1 month to ensure resolution of ground glass appearance on xray.  Discharge summary and pulmonology A/P to follow.  No CP.  She is feeling a little better although she still feels weak.  She is able to accomplish ADL on O2.  She does not need new RF.  Still with fatigue and body aches.  Compliant on meds.  No fevers.  Finished antibiotics.  From discharge summary: Brief/Interim Summary: 44 y.o.femalewithhistory of HTN, asthma, sarcoidosis presents to the ER because of worsening shortness of breath ongoing for last 1 week which started off after patient attended funeral. Patient had some sinus discharge which gradually worsened to cough and shortness of breath. Patient has not had any chest pain until patient reached the ER which lasted a few minutes and resolved without any intervention. In the ER patient was requiring BiPAP to maintain sats. CT angiogram of the chest was negative for pulmonary embolism but does show bilateral groundglass opacities. Covid test was negative. Blood pressure was more than 123456 systolic by [id number] diastolic for which patient was started on nitroglycerin infusion. Lasix was given. Patient also started on empiric antibiotics and nebulizer. Initially critical care was consulted since patient symptoms was improving patient is admitted to stepdown. Labs reveal AST 52 ALT is 47 BNP 241 high sensitive troponin was 58 and 76. Sinus tachycardia nonspecific ST changes. WBC count was 19.4 hemoglobin 9.9 platelets 512. Patient admitted for acute respiratory failure hypoxia likely  combination of possible pulmonary edema, pneumonia and asthma.  Discharge Diagnoses:  Principal Problem:   Acute respiratory failure with hypoxia (HCC) Active Problems:   PULMONARY SARCOIDOSIS   Intrinsic asthma   Hypertensive urgency   Chest pain  Acute hypoxic and hypercarbic respiratory failure likely 2/2 hypertensive crisis/flash pulmonary edema Possible CAP Possible asthma exacerbation Initially requiring BiPAP, currently weaned to 1L of O2  Currently afebrile, with leukocytosis (on steroids) COVID-19 negative, flu negative Recent ABG showed pH of 7.34, PCO2 62.4, PO2 98, repeatwith some improvement in PCO2 Procalcitoninnegative CTA chest noted 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, groundglass opacity could reflect early infiltrate/pneumonia, no evidence of PE PCCM had been following, recommended cont abx Will complete 2 more days of azithromycin and omnicef on d/c S/PSolu-Medrol--> completed prednisone,DuoNeb, Pulmicort Dis[facility] with [facility]  Hypertensive crisis Had been suboptimally controlled S/pnitroglycerin infusion Continued on home lisinopril, hydrochlorothiazide, amlodipine Given concerns of asthma, would avoid beta blocker BP better at time of discharge  Hypokalemia Normal this AM  Atypical chest pain Currently no chest pain Likely 2/2 above Troponins with flat trend, no delta gap, EKG with no acute ST changes Echo demonstrated EF of 60 to 65%, no regional wall motion abnormalities, mild left ventricular hypertrophy, left ventricular diastolic parameters were indeterminate  Microcyticiron defanemia/thrombocytosis Hemoglobin downtrending Reports heavy periods, history of fibroids Anemia panelshowed iron 12,TIBC 459, sats 38, ferritin 6, vitamin B12 530, folate 13.2 Status post 1 dose of Feraheme, continue ferrous sulfate Follow-up as an outpatient with OB/GYN  History of sarcoidosis Continue outpatient follow-up  Tobacco  abuse Reports about a pack  a day, has not smoked for the past 2 weeks Cessationearlier this visit  Morbid obesity Recommend diet/lifestyle modification  From pulmonology A/P 05/29/2019: Intrinsic asthma - Resume Breo 200 one puff daily  - Resume Claritin 10mg  daily  - Continue Albuterol hfa 2 puffs every 6 hours for shortness of breath/wheezing   Acute respiratory failure with hypoxia (HCC) - Continue 3L oxygen to keep O2 >90% - Recommend patient get pulse oximeter to monitor oxygen levels - Ordered for Pulmonary function testing  - FU in 4 weeks to establish with pulmonary MD  Community acquired pneumonia - CTA ground glass opacity left lower lobe, treated with azithromycin and omnicef - Repeat imaging in 4 weeks to ensure resolution    ED/Hospital notes reviewed and summarized above.    ROS:   Constitutional:  No f/c, No night sweats, No unexplained weight loss. EENT:  No vision changes, No blurry vision, No hearing changes. No mouth, throat, or ear problems.  Respiratory: No cough, + SOB Cardiac: No CP, no palpitations GI:  No abd pain, No N/V/D. GU: No Urinary s/sx Musculoskeletal: general body aches Neuro: No headache, no dizziness, no motor weakness.  Skin: No rash Endocrine:  No polydipsia. No polyuria.  Psych: Denies SI/HI  No problems updated.  ALLERGIES: No Known Allergies  PAST MEDICAL HISTORY: Past Medical History:  Diagnosis Date  . Asthma   . Hypertension   . Sarcoidosis   . Sickle cell trait ([place])     MEDICATIONS AT HOME: Prior to Admission medications   Medication Sig Start Date End Date Taking? Authorizing Provider  albuterol (PROVENTIL) (2.5 MG/3ML) 0.083% nebulizer solution Take 3 mLs (2.5 mg total) by nebulization every 6 (six) hours as needed for wheezing or shortness of breath. 05/27/19   [name], NP  albuterol (VENTOLIN HFA) 108 (90 Base) MCG/ACT inhaler Inhale 1-2 puffs into the lungs every 6 (six) hours as needed for  wheezing or shortness of breath (cough). 05/27/19   [name], NP  amLODipine (NORVASC) 10 MG tablet Take 1 tablet (10 mg total) by mouth daily. 05/27/19 08/25/19  [name], NP  aspirin 81 MG chewable tablet Chew 1 tablet (81 mg total) by mouth daily. 05/23/19 06/22/19  Donne Hazel, MD  budesonide-formoterol [facility]) 160-4.5 MCG/ACT inhaler Inhale 2 puffs into the lungs 2 (two) times daily. 06/01/19   [name], NP  docusate sodium (COLACE) 100 MG capsule Take 1 capsule (100 mg total) by mouth 2 (two) times daily. 05/22/19 06/21/19  Donne Hazel, MD  DULoxetine (CYMBALTA) 30 MG capsule Take 1 capsule (30 mg total) by mouth daily. 05/27/19 08/25/19  [name], NP  ferrous sulfate 325 (65 FE) MG tablet Take 1 tablet (325 mg total) by mouth daily with breakfast. 05/23/19 06/22/19  Donne Hazel, MD  hydrochlorothiazide (HYDRODIURIL) 25 MG tablet TAKE 1 TABLET (25 MG TOTAL) BY MOUTH DAILY. 05/25/19   [name], MD  lisinopril (ZESTRIL) 40 MG tablet TAKE 1 TABLET (40 MG TOTAL) BY MOUTH DAILY. 05/26/19   [name], MD  loratadine (CLARITIN) 10 MG tablet Take 1 tablet (10 mg total) by mouth daily. 05/29/19   [name], NP  Masks MISC Nebulizer face mask [id number] 05/27/19   [name], NP     Objective:  EXAM:   Vitals:   06/03/19 1406  BP: (!) 145/89  Pulse: (!) 102  Resp: 16  Temp: (!) 95.9 F (35.5 C)  SpO2: 100%  Weight: 251  lb 12.8 oz (114.2 kg)    General appearance : A&OX3. NAD. Non-toxic-appearing.  On 3L O2 HEENT: Atraumatic and Normocephalic.  PERRLA. EOM intact.   Chest/Lungs:  Breathing-non-labored, fair air entry bilaterally, breath sounds normal without rales, rhonchi, or wheezing  CVS: S1 S2 regular, no murmurs, gallops, rubs  Extremities: Bilateral Lower Ext shows no edema, both legs are warm to touch with = pulse throughout Neurology:  CN II-XII grossly intact, Non focal.   Psych:  TP linear. J/I WNL. Normal speech.  Appropriate eye contact and affect.  Skin:  No Rash  Data Review Lab Results  Component Value Date   HGBA1C 5.0 08/22/2016     Assessment & Plan   1. Essential hypertension Improving-continue current regimen of lisinopril, and amlodipine HCTZ - Comprehensive metabolic panel  2. PULMONARY SARCOIDOSIS Improving to baseline  3. Acute respiratory failure with hypoxia (HCC) Continue 3L O2 and f/up with Pulmonary  4. Anemia, unspecified type contniue ferrous sulfate s/p ferraheme in hospital - CBC with Differential/Platelet  5. Hypokalemia Check CMP  6. Elevated LFTs Check CMP  Patient have been counseled extensively about nutrition and exercise  Return in about 6 weeks (around 07/15/2019) for PCP;  chronic conditions.  The patient was given clear instructions to go to ER or return to medical center if symptoms don't improve, worsen or new problems develop. The patient verbalized understanding. The patient was told to call to get lab results if they haven't heard anything in the next week.     Freeman Caldron, PA-C [facility] and [place], [place]   06/03/2019, 2:26 PM

## 2019-06-03 ENCOUNTER — Other Ambulatory Visit: Payer: Self-pay

## 2019-06-03 ENCOUNTER — Ambulatory Visit: Payer: Medicaid Other | Attending: Nurse Practitioner | Admitting: Physician Assistant

## 2019-06-03 VITALS — BP 145/89 | HR 102 | Temp 95.9°F | Resp 16 | Wt 251.8 lb

## 2019-06-03 DIAGNOSIS — E876 Hypokalemia: Secondary | ICD-10-CM

## 2019-06-03 DIAGNOSIS — D869 Sarcoidosis, unspecified: Secondary | ICD-10-CM | POA: Diagnosis not present

## 2019-06-03 DIAGNOSIS — I10 Essential (primary) hypertension: Secondary | ICD-10-CM

## 2019-06-03 DIAGNOSIS — J9601 Acute respiratory failure with hypoxia: Secondary | ICD-10-CM

## 2019-06-03 DIAGNOSIS — R7989 Other specified abnormal findings of blood chemistry: Secondary | ICD-10-CM

## 2019-06-03 DIAGNOSIS — D649 Anemia, unspecified: Secondary | ICD-10-CM

## 2019-06-04 LAB — COMPREHENSIVE METABOLIC PANEL
ALT: 36 IU/L — ABNORMAL HIGH (ref 0–32)
AST: 27 IU/L (ref 0–40)
Albumin/Globulin Ratio: 1.6 (ref 1.2–2.2)
Albumin: 4.2 g/dL (ref 3.8–4.8)
Alkaline Phosphatase: 69 IU/L (ref 39–117)
BUN/Creatinine Ratio: 11 (ref 9–23)
BUN: 11 mg/dL (ref 6–24)
Bilirubin Total: 0.2 mg/dL (ref 0.0–1.2)
CO2: 32 mmol/L — ABNORMAL HIGH (ref 20–29)
Calcium: 9.5 mg/dL (ref 8.7–10.2)
Chloride: 96 mmol/L (ref 96–106)
Creatinine, Ser: 0.98 mg/dL (ref 0.57–1.00)
GFR calc Af Amer: 80 mL/min/{1.73_m2} (ref >59)
GFR calc non Af Amer: 69 mL/min/{1.73_m2} (ref >59)
Globulin, Total: 2.6 g/dL (ref 1.5–4.5)
Glucose: 85 mg/dL (ref 65–99)
Potassium: 3.4 mmol/L — ABNORMAL LOW (ref 3.5–5.2)
Sodium: 142 mmol/L (ref 134–144)
Total Protein: 6.8 g/dL (ref 6.0–8.5)

## 2019-06-04 LAB — CBC WITH DIFFERENTIAL/PLATELET
Basophils Absolute: 0 10*3/uL (ref 0.0–0.2)
Basos: 0 %
EOS (ABSOLUTE): 0.3 10*3/uL (ref 0.0–0.4)
Eos: 3 %
Hematocrit: 34.3 % (ref 34.0–46.6)
Hemoglobin: 10.3 g/dL — ABNORMAL LOW (ref 11.1–15.9)
Immature Grans (Abs): 0 10*3/uL (ref 0.0–0.1)
Immature Granulocytes: 0 %
Lymphocytes Absolute: 2.3 10*3/uL (ref 0.7–3.1)
Lymphs: 27 %
MCH: 22.4 pg — ABNORMAL LOW (ref 26.6–33.0)
MCHC: 30 g/dL — ABNORMAL LOW (ref 31.5–35.7)
MCV: 75 fL — ABNORMAL LOW (ref 79–97)
Monocytes Absolute: 0.7 10*3/uL (ref 0.1–0.9)
Monocytes: 8 %
Neutrophils Absolute: 5.2 10*3/uL (ref 1.4–7.0)
Neutrophils: 62 %
Platelets: 404 10*3/uL (ref 150–450)
RBC: 4.6 x10E6/uL (ref 3.77–5.28)
RDW: 23.2 % — ABNORMAL HIGH (ref 11.7–15.4)
WBC: 8.4 10*3/uL (ref 3.4–10.8)

## 2019-06-10 ENCOUNTER — Telehealth: Payer: Self-pay | Admitting: Critical Care Medicine

## 2019-06-10 ENCOUNTER — Telehealth: Payer: Self-pay | Admitting: Primary Care

## 2019-06-10 DIAGNOSIS — M25532 Pain in left wrist: Secondary | ICD-10-CM

## 2019-06-10 DIAGNOSIS — J189 Pneumonia, unspecified organism: Secondary | ICD-10-CM

## 2019-06-10 DIAGNOSIS — R8290 Unspecified abnormal findings in urine: Secondary | ICD-10-CM

## 2019-06-10 NOTE — Telephone Encounter (Signed)
OK, THANKS

## 2019-06-10 NOTE — Telephone Encounter (Signed)
Patient has a Ct scheduled on 06/26/2019 @ [place] but she has Medicaid and will need to have the Ct done at [place] someone put in a new Ct order so I can get her Ct rescheduled at [place]

## 2019-06-10 NOTE — Telephone Encounter (Signed)
Have her drop off UA/urine culture at [facility]. Make sure she is staying hydrated and taking Claritin. Does she have a hx of arthritis or gout?

## 2019-06-10 NOTE — Telephone Encounter (Signed)
New order has been placed. Routing this back to anita.

## 2019-06-10 NOTE — Telephone Encounter (Signed)
Spoke with patient. She stated that she will drop off a urine sample tomorrow at [facility].   She stated that she does not have a personal history of arthritis or gout but her mother does.

## 2019-06-10 NOTE — Telephone Encounter (Signed)
Called and spoke with pt who stated she developed a tickle in throat 2 days ago and states now her throat is itchy feeling. Pt states she is coughing with the tickle in her throat. Pt states she is getting up come clear phlegm. Pt also states that her wrists are achy some. Pt also states she has some odor after urinating. Asked pt if she believes she has a UTI and she states that she has no itching or burning after urinating, just has an odor.  Asked pt if she has been running a temp and she denies any complaints.    Pt stated for the tickle in her throat, she has tried to sip on water and take peppermint which has helped some.   Pt wanted to know what we could recommend to help with her symptoms. Pt also wanted to know if it would be okay for her to get the covid vaccine. Beth, please advise on all this for pt.

## 2019-06-11 ENCOUNTER — Telehealth: Payer: Self-pay | Admitting: Primary Care

## 2019-06-11 NOTE — Telephone Encounter (Signed)
Patient has been rescheduled at [place] on 06/26/2019 @ 2:00pm per her request. Patient is aware of the location change

## 2019-06-11 NOTE — Telephone Encounter (Signed)
Beth saw the patient on 05/29/19 hospital follow up appt. The patient was given 02 in the hospital note states to continue 02.  I called this patient this morning to schedule the CT appt that Beth had ordered and when I spoke with the patient she stated she was having problems with her home fill system.  It wasn't working properly to fill her 02 tanks.  She stated that she talked with [facility] about a POC but note doesn't mention it.  Mrs. Hakes stated they talked about a lot that day.  I called [employer] and spoke with Levada Dy about the patient having issue with the home fill system.  She was going to get someone to check out her machine. I also asked the patient getting a POC.  Please see Leah from [employer]'s response below  Jimmy Picket, Demetra Shiner, Melissa; [place], Kathyrn Lass, Carron Curie; 1 other   Christian Mate!   We have reached out to the patient and she is wanting a TOC to utilize until she can get a POC. Can you have the MD place an order?

## 2019-06-11 NOTE — Telephone Encounter (Signed)
Beth please advise if ok to order a POC.  Thanks!

## 2019-06-12 NOTE — Telephone Encounter (Addendum)
Does it look like she had a walk in the office? I dont have any mention of this in my note and it was not ordered

## 2019-06-12 NOTE — Telephone Encounter (Signed)
I do not see that she was walked in office

## 2019-06-12 NOTE — Telephone Encounter (Signed)
Spoke with the pt and notified of recs per [facility] and she verbalized understanding. Nothing further needed.

## 2019-06-12 NOTE — Telephone Encounter (Addendum)
Unfortunately we can not order a POC without walking her to see if she would qualify. I would recommend that she resume BREO like we discussed and have her walk at her follow-up.

## 2019-06-14 NOTE — Progress Notes (Addendum)
Reviewed note and agree with plan. Restart Breo and obtain PFTs. Repeat CT chest ordered. Patient will follow-up with Dr. Carlis Abbott in Pulmonary clinic in June 2021.  Rodman Pickle, M.D. [facility] Pulmonary/Critical Care Medicine 06/14/2019 1:00 PM

## 2019-06-22 DIAGNOSIS — R32 Unspecified urinary incontinence: Secondary | ICD-10-CM | POA: Diagnosis not present

## 2019-06-24 ENCOUNTER — Telehealth: Payer: Self-pay | Admitting: Primary Care

## 2019-06-24 NOTE — Telephone Encounter (Signed)
CT has been denied but Teresa Mendez has a Peer to Peer scheduled 5/28 @ 11:00am [facility] is aware not to CXL CT until we have time to get Peer to Peer done

## 2019-06-26 ENCOUNTER — Other Ambulatory Visit: Payer: Medicaid Other

## 2019-06-26 ENCOUNTER — Ambulatory Visit (INDEPENDENT_AMBULATORY_CARE_PROVIDER_SITE_OTHER): Payer: Medicaid Other | Admitting: Primary Care

## 2019-06-26 ENCOUNTER — Inpatient Hospital Stay: Admission: RE | Admit: 2019-06-26 | Payer: Medicaid Other | Source: Ambulatory Visit

## 2019-06-26 DIAGNOSIS — J189 Pneumonia, unspecified organism: Secondary | ICD-10-CM

## 2019-06-26 NOTE — Progress Notes (Signed)
Spoke with Dr. Vianne Bulls for peer-to-peer regarding CT chest wo contrast that was denied. Patient had a CXR on 05/17/19 which showed no active disease. CTA on 05/17/19 showed ground-glass opacity posterolaterally at the left lung base felt to reflect early pneumonia. She was hospitalized and treated for CAP. Ordered for repeat CT chest to ensure resolution.  Authorization code given [id number]. Will send over paperwork in the next day or two.

## 2019-06-26 NOTE — Telephone Encounter (Signed)
[email or web address] 

## 2019-06-26 NOTE — Patient Instructions (Signed)
PEER-TO-PEER for CT CHEST ORDER

## 2019-06-30 ENCOUNTER — Other Ambulatory Visit (HOSPITAL_COMMUNITY)
Admission: RE | Admit: 2019-06-30 | Discharge: 2019-06-30 | Disposition: A | Discharge status: Home / Self Care | Payer: Medicaid Other | Source: Ambulatory Visit | Attending: Critical Care Medicine | Admitting: Critical Care Medicine

## 2019-06-30 DIAGNOSIS — Z20822 Contact with and (suspected) exposure to covid-19: Secondary | ICD-10-CM | POA: Insufficient documentation

## 2019-06-30 DIAGNOSIS — Z01812 Encounter for preprocedural laboratory examination: Secondary | ICD-10-CM | POA: Diagnosis not present

## 2019-06-30 LAB — SARS CORONAVIRUS 2 (TAT 6-24 HRS): SARS Coronavirus 2: NEGATIVE

## 2019-07-01 ENCOUNTER — Ambulatory Visit: Payer: Medicaid Other | Admitting: Critical Care Medicine

## 2019-07-01 DIAGNOSIS — R32 Unspecified urinary incontinence: Secondary | ICD-10-CM | POA: Diagnosis not present

## 2019-07-03 ENCOUNTER — Other Ambulatory Visit: Payer: Self-pay

## 2019-07-03 ENCOUNTER — Ambulatory Visit (INDEPENDENT_AMBULATORY_CARE_PROVIDER_SITE_OTHER): Payer: Medicaid Other

## 2019-07-03 ENCOUNTER — Ambulatory Visit: Payer: Medicaid Other | Admitting: Pulmonary Disease

## 2019-07-03 ENCOUNTER — Encounter: Payer: Self-pay | Admitting: Pulmonary Disease

## 2019-07-03 ENCOUNTER — Ambulatory Visit (INDEPENDENT_AMBULATORY_CARE_PROVIDER_SITE_OTHER): Payer: Medicaid Other | Admitting: Pulmonary Disease

## 2019-07-03 ENCOUNTER — Ambulatory Visit (INDEPENDENT_AMBULATORY_CARE_PROVIDER_SITE_OTHER): Payer: Medicaid Other | Admitting: Critical Care Medicine

## 2019-07-03 VITALS — BP 128/72 | HR 95 | Temp 98.2°F | Ht 64.0 in | Wt 252.0 lb

## 2019-07-03 DIAGNOSIS — D869 Sarcoidosis, unspecified: Secondary | ICD-10-CM | POA: Diagnosis not present

## 2019-07-03 DIAGNOSIS — R079 Chest pain, unspecified: Secondary | ICD-10-CM

## 2019-07-03 DIAGNOSIS — M791 Myalgia, unspecified site: Secondary | ICD-10-CM | POA: Insufficient documentation

## 2019-07-03 DIAGNOSIS — R918 Other nonspecific abnormal finding of lung field: Secondary | ICD-10-CM

## 2019-07-03 DIAGNOSIS — R0602 Shortness of breath: Secondary | ICD-10-CM | POA: Diagnosis not present

## 2019-07-03 DIAGNOSIS — J45909 Unspecified asthma, uncomplicated: Secondary | ICD-10-CM | POA: Diagnosis not present

## 2019-07-03 DIAGNOSIS — J9601 Acute respiratory failure with hypoxia: Secondary | ICD-10-CM | POA: Diagnosis not present

## 2019-07-03 DIAGNOSIS — J438 Other emphysema: Secondary | ICD-10-CM | POA: Diagnosis not present

## 2019-07-03 DIAGNOSIS — I10 Essential (primary) hypertension: Secondary | ICD-10-CM | POA: Diagnosis not present

## 2019-07-03 DIAGNOSIS — J439 Emphysema, unspecified: Secondary | ICD-10-CM | POA: Insufficient documentation

## 2019-07-03 LAB — PULMONARY FUNCTION TEST
DL/VA % pred: 116 %
DL/VA: 5 ml/min/mmHg/L
DLCO cor % pred: 86 %
DLCO cor: 19.39 ml/min/mmHg
DLCO unc % pred: 76 %
DLCO unc: 17.25 ml/min/mmHg
FEF 25-75 Post: 0.77 L/sec
FEF 25-75 Pre: 0.38 L/sec
FEF2575-%Change-Post: 102 %
FEF2575-%Pred-Post: 28 %
FEF2575-%Pred-Pre: 14 %
FEV1-%Change-Post: 35 %
FEV1-%Pred-Post: 47 %
FEV1-%Pred-Pre: 34 %
FEV1-Post: 1.2 L
FEV1-Pre: 0.89 L
FEV1FVC-%Change-Post: 6 %
FEV1FVC-%Pred-Pre: 56 %
FEV6-%Change-Post: 29 %
FEV6-%Pred-Post: 77 %
FEV6-%Pred-Pre: 59 %
FEV6-Post: 2.37 L
FEV6-Pre: 1.83 L
FEV6FVC-%Change-Post: 1 %
FEV6FVC-%Pred-Post: 100 %
FEV6FVC-%Pred-Pre: 99 %
FVC-%Change-Post: 27 %
FVC-%Pred-Post: 76 %
FVC-%Pred-Pre: 60 %
FVC-Post: 2.41 L
FVC-Pre: 1.89 L
Post FEV1/FVC ratio: 50 %
Post FEV6/FVC ratio: 98 %
Pre FEV1/FVC ratio: 47 %
Pre FEV6/FVC Ratio: 97 %
RV % pred: 140 %
RV: 2.5 L
TLC % pred: 88 %
TLC: 4.65 L

## 2019-07-03 NOTE — Assessment & Plan Note (Signed)
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

## 2019-07-03 NOTE — Assessment & Plan Note (Signed)
Patient reporting myalgias all over her body.  She reports she contacted her primary care who diagnosed her with gout.  Plan: Lab work today Schedule in person follow-up with primary care for further evaluation

## 2019-07-03 NOTE — Assessment & Plan Note (Signed)
April/2021 echocardiogram History of atypical chest pain Ongoing atypical chest pain today status post PFT Patient reporting bandlike chest pain across her chest EKG today stable  Plan: EKG Walk today in office Chest x-ray Lab work Close follow-up with our office If symptoms worsen patient needs to seek emergent evaluation

## 2019-07-03 NOTE — Progress Notes (Signed)
@Patient  ID: Teresa Mendez, female    DOB: 1975-11-24, 44 y.o.   MRN: [id number]  Chief Complaint  Patient presents with  . Follow-up    Pulmonary function test, acute chest pain, shortness of breath    Referring provider: [name], NP  HPI:  44 year old female former smoker followed in our office for asthma, emphysema and pulmonary sarcoid.  Former patient of Dr. Mirian Capuchin.  Establishing care with Dr. Carlis Abbott.  PMH: Hypertension Smoker/ Smoking History: Former smoker Maintenance: Symbicort 160 Pt of: Dr. Carlis Abbott  07/03/2019  - Visit   44 year old female former smoker with past medical history significant for asthma, emphysema and pulmonary sarcoid.  Former patient of Dr. [name].  Presented back to our office after hospitalization April/2021 where she was reconsulted with PCCM.  For possible asthma exacerbation, pulmonary edema and community-acquired pneumonia.  Patient has had worsening shortness of breath for 1 week.  Treated with nitroglycerin drip, Lasix, CTA chest showed groundglass opacity and emphysema.  Patient presenting to office today to complete pulmonary function testing.  Patient was able to complete pulmonary function testing but after pulmonary function test started developed acute chest pain and shortness of breath.  Patient added onto office schedule for further evaluation.  EKG performed stable.  Mild LVH.  This is in keeping with April/2021 echocardiogram.  Walk today in office patient did not have oxygen desaturations on 2 L.  Patient was symptomatic and having increased dyspnea and chest pain during walk.  Patient is tearful.  Patient also endorsing myalgias all over her body.  She reports that she contacted primary care and they told her that it was gout.  She did not have a physical evaluation by them.  She also reports increased nausea over the last few weeks.  Questionaires / Pulmonary Flowsheets:   ACT:  No flowsheet data found.  MMRC: No flowsheet data  found.  Epworth:  No flowsheet data found.  Tests:   05/17/2019-CTA chest-bullous emphysematous changes in upper lobes, groundglass opacity posterior laterally in left lung base could reflect early infiltrate or pneumonia, no evidence of PE  07/03/2019-chest x-ray-no acute cardiopulmonary disease  05/18/2019-echocardiogram-LV ejection fraction 60 to 65%, mild LVH, right ventricular systolic function is normal  07/14/2007-pulmonary function test-FVC 3.29 (89% predicted), postbronchodilator ratio 57, postbronchodilator FEV1 2.05 (70% predicted), positive bronchodilator response in FEV1, mid flow reversibility, DLCO 15.1 (58% predicted)  07/03/2019-pulmonary function test-FVC 1.89 (60% predicted), postbronchodilator ratio 50, postbronchodilator FEV1 1.20 (47% duty), positive bronchodilator response, mid flow reversibility, DLCO 17.25 (76% predicted)  FENO:  No results found for: NITRICOXIDE  PFT: PFT Results Latest Ref Rng & Units 07/03/2019  FVC-Pre L 1.89  FVC-Predicted Pre % 60  FVC-Post L 2.41  FVC-Predicted Post % 76  Pre FEV1/FVC % % 47  Post FEV1/FCV % % 50  FEV1-Pre L 0.89  FEV1-Predicted Pre % 34  FEV1-Post L 1.20  DLCO UNC% % 76  DLCO COR %Predicted % 116  TLC L 4.65  TLC % Predicted % 88  RV % Predicted % 140    WALK:  SIX MIN WALK 07/03/2019  Supplimental Oxygen during Test? (L/min) Yes  O2 Flow Rate 3  Type Continuous  Tech Comments: Pt walked at a slow pace wheeling O2 tank. Pt had mild SOB, lower back pain, and mild CP and was only able to walk 1 lap.    Imaging: DG Chest 2 View  Result Date: 07/03/2019 CLINICAL DATA:  Shortness of breath EXAM: CHEST - 2 VIEW COMPARISON:  05/17/2018 FINDINGS: Bullous emphysematous changes in the lung apices. Heart is normal size. No confluent opacities or effusions. No acute bony abnormality. IMPRESSION: No acute cardiopulmonary disease. Electronically Signed   By: Rolm Baptise M.D.   On: 07/03/2019 17:44    Lab Results:  CBC     Component Value Date/Time   WBC 8.4 06/03/2019 1441   WBC 21.5 (H) 05/22/2019 0702   RBC 4.60 06/03/2019 1441   RBC 5.00 05/22/2019 0702   HGB 10.3 (L) 06/03/2019 1441   HCT 34.3 06/03/2019 1441   PLT 404 06/03/2019 1441   MCV 75 (L) 06/03/2019 1441   MCH 22.4 (L) 06/03/2019 1441   MCH 19.4 (L) 05/22/2019 0702   MCHC 30.0 (L) 06/03/2019 1441   MCHC 29.0 (L) 05/22/2019 0702   RDW 23.2 (H) 06/03/2019 1441   LYMPHSABS 2.3 06/03/2019 1441   MONOABS 0.4 05/22/2019 0702   EOSABS 0.3 06/03/2019 1441   BASOSABS 0.0 06/03/2019 1441    BMET    Component Value Date/Time   NA 140 07/03/2019 1647   NA 142 06/03/2019 1441   K 3.4 (L) 07/03/2019 1647   CL 100 07/03/2019 1647   CO2 35 (H) 07/03/2019 1647   GLUCOSE 95 07/03/2019 1647   BUN 12 07/03/2019 1647   BUN 11 06/03/2019 1441   CREATININE 0.85 07/03/2019 1647   CALCIUM 9.3 07/03/2019 1647   GFRNONAA 69 06/03/2019 1441   GFRAA 80 06/03/2019 1441    BNP    Component Value Date/Time   BNP 243.1 (H) 05/17/2019 1608    ProBNP No results found for: PROBNP  Specialty Problems      Pulmonary Problems   DYSPNEA    Qualifier: Diagnosis of  By: Doy Mince LPN, Megan        Intrinsic asthma    Qualifier: Diagnosis of  By: [name] MD, Armando Reichert       Acute respiratory failure with hypoxia ([place])   Community acquired pneumonia   Emphysema lung ([place])      No Known Allergies  There is no immunization history for the selected administration types on file for this patient.  Past Medical History:  Diagnosis Date  . Asthma   . Hypertension   . Sarcoidosis   . Sickle cell trait ([place])     Tobacco History: Social History   Tobacco Use  Smoking Status Former Smoker  . Packs/day: 0.50  . Types: Cigarettes  Smokeless Tobacco Never Used  Tobacco Comment   Pt. stated she quit a few months back.    Counseling given: Yes Comment: Pt. stated she quit a few months back.    Continue to not smoke  Outpatient Encounter  Medications as of 07/03/2019  Medication Sig  . albuterol (PROVENTIL) (2.5 MG/3ML) 0.083% nebulizer solution Take 3 mLs (2.5 mg total) by nebulization every 6 (six) hours as needed for wheezing or shortness of breath.  Marland Kitchen albuterol (VENTOLIN HFA) 108 (90 Base) MCG/ACT inhaler Inhale 1-2 puffs into the lungs every 6 (six) hours as needed for wheezing or shortness of breath (cough).  Marland Kitchen amLODipine (NORVASC) 10 MG tablet Take 1 tablet (10 mg total) by mouth daily.  . budesonide-formoterol (SYMBICORT) 160-4.5 MCG/ACT inhaler Inhale 2 puffs into the lungs 2 (two) times daily.  . DULoxetine (CYMBALTA) 30 MG capsule Take 1 capsule (30 mg total) by mouth daily.  . hydrochlorothiazide (HYDRODIURIL) 25 MG tablet TAKE 1 TABLET (25 MG TOTAL) BY MOUTH DAILY.  Marland Kitchen lisinopril (ZESTRIL) 40 MG tablet TAKE 1 TABLET (  40 MG TOTAL) BY MOUTH DAILY.  Marland Kitchen loratadine (CLARITIN) 10 MG tablet Take 1 tablet (10 mg total) by mouth daily.  . Masks MISC Nebulizer face mask J45.909  . ferrous sulfate 325 (65 FE) MG tablet Take 1 tablet (325 mg total) by mouth daily with breakfast.   No facility-administered encounter medications on file as of 07/03/2019.     Review of Systems  Review of Systems  Constitutional: Positive for fatigue. Negative for activity change and fever.  HENT: Negative for sinus pressure, sinus pain and sore throat.   Respiratory: Positive for shortness of breath. Negative for cough and wheezing.   Cardiovascular: Positive for chest pain. Negative for palpitations.  Gastrointestinal: Positive for nausea. Negative for diarrhea and vomiting.  Musculoskeletal: Positive for arthralgias.  Neurological: Negative for dizziness.  Psychiatric/Behavioral: Negative for sleep disturbance. The patient is nervous/anxious.      Physical Exam  BP 128/72 (BP Location: Left Arm, Patient Position: Sitting, Cuff Size: Normal)   Pulse 95   Temp 98.2 F (36.8 C) (Oral)   Ht 5\' 4"  (1.626 m)   Wt 252 lb (114.3 kg)   LMP  06/29/2019 (Within Days)   SpO2 100%   BMI 43.26 kg/m   Wt Readings from Last 5 Encounters:  07/03/19 252 lb (114.3 kg)  06/03/19 251 lb 12.8 oz (114.2 kg)  05/29/19 249 lb 9.6 oz (113.2 kg)  05/22/19 242 lb 15.2 oz (110.2 kg)  11/20/18 259 lb 4.8 oz (117.6 kg)    BMI Readings from Last 5 Encounters:  07/03/19 43.26 kg/m  06/03/19 43.22 kg/m  05/29/19 42.84 kg/m  05/22/19 41.70 kg/m  11/20/18 44.51 kg/m     Physical Exam Vitals and nursing note reviewed.  Constitutional:      General: She is in acute distress.     Appearance: Normal appearance. She is obese. She is ill-appearing.  HENT:     Head: Normocephalic and atraumatic.     Right Ear: External ear normal.     Left Ear: External ear normal.     Nose: Nose normal. No congestion.     Mouth/Throat:     Mouth: Mucous membranes are moist.     Pharynx: Oropharynx is clear.  Eyes:     Pupils: Pupils are equal, round, and reactive to light.  Cardiovascular:     Rate and Rhythm: Normal rate and regular rhythm.     Pulses: Normal pulses.     Heart sounds: Normal heart sounds. No murmur.  Pulmonary:     Breath sounds: No decreased air movement. No decreased breath sounds, wheezing or rales.  Abdominal:     General: Abdomen is flat. Bowel sounds are normal. There is no distension.     Palpations: Abdomen is soft. There is no mass.  Musculoskeletal:     Cervical back: Normal range of motion.     Right lower leg: No edema.     Left lower leg: No edema.  Skin:    General: Skin is warm and dry.     Capillary Refill: Capillary refill takes less than 2 seconds.  Neurological:     General: No focal deficit present.     Mental Status: She is alert and oriented to person, place, and time. Mental status is at baseline.     Gait: Gait normal.  Psychiatric:        Mood and Affect: Affect is flat and tearful.        Speech: Speech is rapid and pressured.  Behavior: Behavior normal.        Thought Content: Thought  content normal.        Judgment: Judgment normal.       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  Given patient's cardiac symptoms as well as history of cardiac disease.  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 failure  Plan: Obtain repeat CT imaging as already planned Keep close follow-up with our office Walk today in office stable without oxygen desaturations EKG today stable Chest x-ray today Lab work today Explained to patient that if symptoms worsen she needs to seek emergent evaluation  Chest pain April/2021 echocardiogram History of atypical chest pain Ongoing atypical chest pain today status post PFT Patient reporting bandlike chest pain across her chest EKG today stable  Plan: EKG Walk today in office Chest x-ray Lab work Close follow-up with our office If symptoms worsen  patient needs to seek emergent evaluation  Emphysema lung ([place]) Former smoker 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  She reports she contacted her primary care who diagnosed her with gout.  Plan: Lab work today Schedule in person follow-up with primary care for further evaluation    Return in about 4 weeks (around 07/31/2019), or if symptoms worsen or fail to improve, for Follow up with Dr. Carlis Abbott, [place].   Lauraine Rinne, NP 07/03/2019   This appointment required 55 minutes of patient care (this includes precharting, chart review, review of results, face-to-face care, etc.).

## 2019-07-03 NOTE — Assessment & Plan Note (Signed)
Carries diagnosis of pulmonary sarcoid Unclear if this has been biopsy-proven History of mildly elevated ACE level in the past per chart review PFTs today showed mixed restrictive and obstructive lung disease  Plan: Chest x-ray today Lab work today

## 2019-07-03 NOTE — Assessment & Plan Note (Signed)
Dyspnea today is likely multifactorial.  Given patient's cardiac symptoms as well as history of cardiac disease.  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 failure  Plan: Obtain repeat CT imaging as already planned Keep close follow-up with our office Walk today in office stable without oxygen desaturations EKG today stable Chest x-ray today Lab work today Explained to patient that if symptoms worsen she needs to seek emergent evaluation

## 2019-07-03 NOTE — Assessment & Plan Note (Signed)
Recent hospitalization with hypertensive crisis  Plan: Patient needs to discuss with primary care stopping lisinopril and starting replacement blood pressure agent Schedule follow-up with primary care

## 2019-07-03 NOTE — Assessment & Plan Note (Signed)
Plan: Continue forward with planned follow-up repeat CT chest to further evaluate groundglass opacities seen on April/2021 CT

## 2019-07-03 NOTE — Patient Instructions (Addendum)
You were seen today by Teresa Rinne, NP  for:   I am sorry that you are having the symptoms that you are having today.  I am concerned regarding your increased shortness of breath as well as your chest pain.  We performed a EKG and will do lab work as well as a chest x-ray.  If symptoms worsen before we have these results you will need to present to an emergency room for further evaluation.  Please schedule a follow-up appointment with primary care.  Discussed with them the ongoing myalgias and joint pain that you are having.  We will get you scheduled for follow-up with Teresa Mendez in a 30-minute time slot  Take care and stay safe,  Teresa Mendez  1. Shortness of breath  - DG Chest 2 View; Future - CBC with Differential/Platelet; Future - Comp Met (CMET); Future - B Nat Peptide; Future  Walk today in office   2. Chest pain, unspecified type  Labs today Chest x-ray today Walk today in office EKG today  3. Sarcoidosis  Lab work today Chest x-ray today  4. Intrinsic asthma  Continue Symbicort 160 >>> 2 puffs in the morning right when you wake up, rinse out your mouth after use, 12 hours later 2 puffs, rinse after use >>> Take this daily, no matter what >>> This is not a rescue inhaler   5. Myalgias   Lab work today  Please schedule appointment with primary care for in person physical evaluation   We recommend today:  Orders Placed This Encounter  Procedures  . DG Chest 2 View    Standing Status:   Future    Standing Expiration Date:   11/02/2019    Order Specific Question:   Reason for Exam (SYMPTOM  OR DIAGNOSIS REQUIRED)    Answer:   doe    Order Specific Question:   Preferred imaging location?    Answer:   Internal    Order Specific Question:   Radiology Contrast Protocol - do NOT remove file path    Answer:   [email or web address]  . CBC with Differential/Platelet    Standing Status:   Future    Standing Expiration Date:    07/02/2020  . Comp Met (CMET)    Standing Status:   Future    Standing Expiration Date:   07/02/2020  . B Nat Peptide    Standing Status:   Future    Standing Expiration Date:   07/02/2020   Orders Placed This Encounter  Procedures  . DG Chest 2 View  . CBC with Differential/Platelet  . Comp Met (CMET)  . B Nat Peptide   No orders of the defined types were placed in this encounter.   Follow Up:    Return in about 4 weeks (around 07/31/2019), or if symptoms worsen or fail to improve, for Follow up with Teresa Mendez, Teresa Mendez.   Please do your part to reduce the spread of COVID-19:      Reduce your risk of any infection  and COVID19 by using the similar precautions used for avoiding the common cold or flu:  Marland Kitchen Wash your hands often with soap and warm water for at least 20 seconds.  If soap and water are not readily available, use an alcohol-based hand sanitizer with at least 60% alcohol.  . If coughing or sneezing, cover your mouth and nose by coughing or sneezing into the elbow areas of your shirt or coat, into a tissue or into your  sleeve (not your hands). Teresa Mendez A MASK when in public  . Avoid shaking hands with others and consider head nods or verbal greetings only. . Avoid touching your eyes, nose, or mouth with unwashed hands.  . Avoid close contact with people who are sick. . Avoid places or events with large numbers of people in one location, like concerts or sporting events. . If you have some symptoms but not all symptoms, continue to monitor at home and seek medical attention if your symptoms worsen. . If you are having a medical emergency, call 911.   [place] / e-Visit: [email or web address]         [facility]: [facility]: [phone]                   [facility]: [phone]     It is flu season:   >>>  Best ways to protect herself from the flu: Receive the yearly flu vaccine, practice good hand hygiene washing with soap and also using hand sanitizer when available, eat a nutritious meals, get adequate rest, hydrate appropriately   Please contact the office if your symptoms worsen or you have concerns that you are not improving.   Thank you for choosing [facility] for your healthcare, and for allowing [place] to partner with you on your healthcare journey. I am thankful to be able to provide care to you today.   Teresa Quaker FNP-C

## 2019-07-03 NOTE — Progress Notes (Signed)
PFT done today. 

## 2019-07-03 NOTE — Assessment & Plan Note (Signed)
Plan: Continue 3 L O2 Maintain oxygen saturations above 88% Lab work today Walk today in office Chest x-ray

## 2019-07-03 NOTE — Assessment & Plan Note (Signed)
2009 pulmonary function test shows positive bronchodilator response in FEV1 Pulmonary function test today reviewed today that showed positive bronchodilator response Restrictive and obstructive lung disease on pulmonary function test today  Plan: Labs today Chest x-ray today Walk today Continue Breo Ellipta Keep close follow-up with our office

## 2019-07-07 ENCOUNTER — Other Ambulatory Visit: Payer: Self-pay

## 2019-07-07 ENCOUNTER — Encounter: Payer: Self-pay | Admitting: Critical Care Medicine

## 2019-07-07 ENCOUNTER — Ambulatory Visit (INDEPENDENT_AMBULATORY_CARE_PROVIDER_SITE_OTHER): Payer: Medicaid Other | Admitting: Critical Care Medicine

## 2019-07-07 ENCOUNTER — Other Ambulatory Visit: Payer: Self-pay | Admitting: Critical Care Medicine

## 2019-07-07 VITALS — BP 142/80 | HR 100 | Temp 98.6°F | Ht 64.0 in | Wt 252.8 lb

## 2019-07-07 DIAGNOSIS — J45909 Unspecified asthma, uncomplicated: Secondary | ICD-10-CM

## 2019-07-07 DIAGNOSIS — J9611 Chronic respiratory failure with hypoxia: Secondary | ICD-10-CM

## 2019-07-07 DIAGNOSIS — D509 Iron deficiency anemia, unspecified: Secondary | ICD-10-CM | POA: Diagnosis not present

## 2019-07-07 DIAGNOSIS — J4550 Severe persistent asthma, uncomplicated: Secondary | ICD-10-CM | POA: Diagnosis not present

## 2019-07-07 DIAGNOSIS — D869 Sarcoidosis, unspecified: Secondary | ICD-10-CM | POA: Diagnosis not present

## 2019-07-07 DIAGNOSIS — K219 Gastro-esophageal reflux disease without esophagitis: Secondary | ICD-10-CM | POA: Diagnosis not present

## 2019-07-07 LAB — COMPREHENSIVE METABOLIC PANEL
AG Ratio: 1.4 (calc) (ref 1.0–2.5)
ALT: 31 U/L — ABNORMAL HIGH (ref 6–29)
AST: 29 U/L (ref 10–35)
Albumin: 4.3 g/dL (ref 3.6–5.1)
Alkaline phosphatase (APISO): 56 U/L (ref 31–125)
BUN: 12 mg/dL (ref 7–25)
CO2: 35 mmol/L — ABNORMAL HIGH (ref 20–32)
Calcium: 9.3 mg/dL (ref 8.6–10.2)
Chloride: 100 mmol/L (ref 98–110)
Creat: 0.85 mg/dL (ref 0.50–1.10)
Globulin: 3 g/dL (calc) (ref 1.9–3.7)
Glucose, Bld: 95 mg/dL (ref 65–99)
Potassium: 3.4 mmol/L — ABNORMAL LOW (ref 3.5–5.3)
Sodium: 140 mmol/L (ref 135–146)
Total Bilirubin: 0.3 mg/dL (ref 0.2–1.2)
Total Protein: 7.3 g/dL (ref 6.1–8.1)

## 2019-07-07 LAB — ANGIOTENSIN CONVERTING ENZYME: Angiotensin-Converting Enzyme: 12 U/L (ref 9–67)

## 2019-07-07 LAB — CBC WITH DIFFERENTIAL/PLATELET
Absolute Monocytes: 762 cells/uL (ref 200–950)
Basophils Absolute: 30 cells/uL (ref 0–200)
Basophils Relative: 0.3 %
Eosinophils Absolute: 168 cells/uL (ref 15–500)
Eosinophils Relative: 1.7 %
HCT: 37.3 % (ref 35.0–45.0)
Hemoglobin: 11.6 g/dL — ABNORMAL LOW (ref 11.7–15.5)
Lymphs Abs: 2930 cells/uL (ref 850–3900)
MCH: 24.3 pg — ABNORMAL LOW (ref 27.0–33.0)
MCHC: 31.1 g/dL — ABNORMAL LOW (ref 32.0–36.0)
MCV: 78.2 fL — ABNORMAL LOW (ref 80.0–100.0)
MPV: 8.7 fL (ref 7.5–12.5)
Monocytes Relative: 7.7 %
Neutro Abs: 6009 cells/uL (ref 1500–7800)
Neutrophils Relative %: 60.7 %
Platelets: 366 10*3/uL (ref 140–400)
RBC: 4.77 10*6/uL (ref 3.80–5.10)
RDW: 21.9 % — ABNORMAL HIGH (ref 11.0–15.0)
Total Lymphocyte: 29.6 %
WBC: 9.9 10*3/uL (ref 3.8–10.8)

## 2019-07-07 LAB — ANA,IFA RA DIAG PNL W/RFLX TIT/PATN
Anti Nuclear Antibody (ANA): POSITIVE — AB
Cyclic Citrullin Peptide Ab: <16 UNITS
Rheumatoid fact SerPl-aCnc: <14 IU/mL (ref <14)

## 2019-07-07 LAB — ANTI-NUCLEAR AB-TITER (ANA TITER): ANA Titer 1: 1:80 {titer} — ABNORMAL HIGH

## 2019-07-07 LAB — CBC MORPHOLOGY

## 2019-07-07 LAB — BRAIN NATRIURETIC PEPTIDE: Brain Natriuretic Peptide: 10 pg/mL (ref <100)

## 2019-07-07 MED ORDER — CETIRIZINE HCL 10 MG PO TABS: 10.0000 mg | ORAL_TABLET | Freq: Every day | ORAL | 11 refills | Status: DC

## 2019-07-07 MED ORDER — ALBUTEROL SULFATE (2.5 MG/3ML) 0.083% IN NEBU: 2.5000 mg | INHALATION_SOLUTION | Freq: Four times a day (QID) | RESPIRATORY_TRACT | 11 refills | Status: DC | PRN

## 2019-07-07 MED ORDER — MONTELUKAST SODIUM 10 MG PO TABS: 10.0000 mg | ORAL_TABLET | Freq: Every day | ORAL | 11 refills | Status: DC

## 2019-07-07 MED ORDER — CARVEDILOL 3.125 MG PO TABS: 3.1250 mg | ORAL_TABLET | Freq: Two times a day (BID) | ORAL | 1 refills | Status: DC

## 2019-07-07 MED ORDER — BREZTRI AEROSPHERE 160-9-4.8 MCG/ACT IN AERO: 2.0000 | INHALATION_SPRAY | Freq: Two times a day (BID) | RESPIRATORY_TRACT | 11 refills | Status: DC

## 2019-07-07 MED ORDER — FERROUS SULFATE 325 (65 FE) MG PO TABS: 325.0000 mg | ORAL_TABLET | Freq: Every day | ORAL | 2 refills | Status: DC

## 2019-07-07 NOTE — Patient Instructions (Addendum)
Thank you for visiting Dr. Carlis Abbott at [facility]. We recommend the following: Orders Placed This Encounter  Procedures  . IgE  . C-reactive protein  . Sedimentation rate   Orders Placed This Encounter  Procedures  . IgE    Standing Status:   Future    Standing Expiration Date:   07/06/2020  . C-reactive protein    Standing Status:   Future    Standing Expiration Date:   07/06/2020  . Sedimentation rate    Standing Status:   Future    Standing Expiration Date:   07/06/2020    Meds ordered this encounter  Medications  . montelukast (SINGULAIR) 10 MG tablet    Sig: Take 1 tablet (10 mg total) by mouth at bedtime.    Dispense:  30 tablet    Refill:  11  . Budeson-Glycopyrrol-Formoterol (BREZTRI AEROSPHERE) 160-9-4.8 MCG/ACT AERO    Sig: Inhale 2 puffs into the lungs 2 (two) times daily.    Dispense:  10.7 g    Refill:  11  . albuterol (PROVENTIL) (2.5 MG/3ML) 0.083% nebulizer solution    Sig: Take 3 mLs (2.5 mg total) by nebulization every 6 (six) hours as needed for wheezing or shortness of breath.    Dispense:  150 mL    Refill:  11  . cetirizine (ZYRTEC ALLERGY) 10 MG tablet    Sig: Take 1 tablet (10 mg total) by mouth daily.    Dispense:  30 tablet    Refill:  11  . ferrous sulfate 325 (65 FE) MG tablet    Sig: Take 1 tablet (325 mg total) by mouth daily with breakfast.    Dispense:  30 tablet    Refill:  2  . carvedilol (COREG) 3.125 MG tablet    Sig: Take 1 tablet (3.125 mg total) by mouth 2 (two) times daily with a meal.    Dispense:  60 tablet    Refill:  1    When you start Breztri, stop Symbicort. When you start Zyrtec (cetirizine) you can stop the loratidine (Claritin). Keep using albuterol as your rescue inhaler. Keep using your oxygen all the time.  Coreg is your new blood pressure medicine.  Keep taking your amlodipine, but if your BP is better next week, you may be able to stop amlodipine.   Return in about 4 weeks (around 08/04/2019).    Please  do your part to reduce the spread of COVID-19.

## 2019-07-07 NOTE — Progress Notes (Signed)
Synopsis: Referred in 2021 for asthma by Teresa Pounds, NP. Formerly a patient of Dr. Gwenette Mendez in 2009.  Subjective:   PATIENT ID: Teresa Mendez GENDER: female DOB: 30-Nov-1975, MRN: [id number]  Chief Complaint  Patient presents with  . Follow-up    Saw Teresa Mendez on 6/4 Dry cough has got worse. Patient wears 3 liters all the time. Patient wants POC    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 She was diagnosed with asthma in her 54s but was recently hospitalized for the first time in April 2021. She was formerly a patient of Dr. Gwenette Mendez, who had not been seen in several years prior to being hospitalized in April 2021 for acute respiratory failure due to an acute asthma exacerbation. She has been on Symbicort BID but continues to have a chronic dry cough, which worsens when supine. She has had clear mucus production and ongoing SOB, but denies wheezing. She has been coughing to the point of having incontinence. Her activity is significantly limited due to her breathing. Overall she does not feel that her asthma is well controlled. She had chest pain last week after her PFTs and as seen for an urgent visit. She describes a "stinging" feeling in her chest occasionally. Overall her heartburn has improved with dietary modifications and eating less spicy foods. She continues to have heartburn, but is unsure how many days per week. She has not been taking medications for this. She has sinus allergy symptoms, with rhinorrhea, congestion, sneezing, and itchy throat. She is currently taking claritin for this.  She has continued using her 3L O2 since discharge from the hospital in April, but was not previously on O2.  Shoulder, knee, and wrist pain bilaterally for a few weeks. No erythema or warmth.  Almost out of iron pills.  Thinks amlodipine is giving her headaches and wants to try something else for her HTN. Still  taking HCTZ and lisinopril at max dose. Next PCP appt on 6/16.    Past Medical History:  Diagnosis Date  . Asthma   . Hypertension   . Sarcoidosis   . Sickle cell trait (HCC)      Family History  Problem Relation Age of Onset  . Hypertension Mother   . Congestive Heart Failure Mother   . Kidney failure Mother   . Sickle cell trait Mother   . Hypertension Father   . Congestive Heart Failure Father   . Kidney failure Father   . Glaucoma Father      Past Surgical History:  Procedure Laterality Date  . TUBAL LIGATION      Social History   Socioeconomic History  . Marital status: Single    Spouse name: Not on file  . Number of children: Not on file  . Years of education: Not on file  . Highest education level: Not on file  Occupational History  . Not on file  Tobacco Use  . Smoking status: Former Smoker    Packs/day: 0.50    Types: Cigarettes  . Smokeless tobacco: Never Used  . Tobacco comment: Pt. stated she quit a few months back.   Substance and Sexual Activity  . Alcohol use: Yes    Comment: Occasionally   . Drug use: No  . Sexual activity: Yes  Other Topics Concern  . Not on file  Social History Narrative  . Not on file   Social Determinants  of Health   Financial Resource Strain:   . Difficulty of Paying Living Expenses:   Food Insecurity:   . Worried About Charity fundraiser in the Last Year:   . Arboriculturist in the Last Year:   Transportation Needs:   . Film/video editor (Medical):   Marland Kitchen Lack of Transportation (Non-Medical):   Physical Activity:   . Days of Exercise per Week:   . Minutes of Exercise per Session:   Stress:   . Feeling of Stress :   Social Connections:   . Frequency of Communication with Friends and Family:   . Frequency of Social Gatherings with Friends and Family:   . Attends Religious Services:   . Active Member of Clubs or Organizations:   . Attends Archivist Meetings:   Marland Kitchen Marital Status:   Intimate  Partner Violence:   . Fear of Current or Ex-Partner:   . Emotionally Abused:   Marland Kitchen Physically Abused:   . Sexually Abused:      No Known Allergies   There is no immunization history for the selected administration types on file for this patient.  Outpatient Medications Prior to Visit  Medication Sig Dispense Refill  . albuterol (VENTOLIN HFA) 108 (90 Base) MCG/ACT inhaler Inhale 1-2 puffs into the lungs every 6 (six) hours as needed for wheezing or shortness of breath (cough). 8.5 g 0  . amLODipine (NORVASC) 10 MG tablet Take 1 tablet (10 mg total) by mouth daily. 90 tablet 1  . aspirin EC 81 MG tablet Take 81 mg by mouth daily.    . budesonide-formoterol (SYMBICORT) 160-4.5 MCG/ACT inhaler Inhale 2 puffs into the lungs 2 (two) times daily. 1 Inhaler 6  . DULoxetine (CYMBALTA) 30 MG capsule Take 1 capsule (30 mg total) by mouth daily. 90 capsule 0  . hydrochlorothiazide (HYDRODIURIL) 25 MG tablet TAKE 1 TABLET (25 MG TOTAL) BY MOUTH DAILY. 30 tablet 2  . lisinopril (ZESTRIL) 40 MG tablet TAKE 1 TABLET (40 MG TOTAL) BY MOUTH DAILY. 30 tablet 2  . loratadine (CLARITIN) 10 MG tablet Take 1 tablet (10 mg total) by mouth daily. 30 tablet 6  . Masks MISC Nebulizer face mask J45.909 1 each 0  . OXYGEN Inhale into the lungs. 3 liters    . albuterol (PROVENTIL) (2.5 MG/3ML) 0.083% nebulizer solution Take 3 mLs (2.5 mg total) by nebulization every 6 (six) hours as needed for wheezing or shortness of breath. 150 mL 2  . ferrous sulfate 325 (65 FE) MG tablet Take 1 tablet (325 mg total) by mouth daily with breakfast. 30 tablet 0   No facility-administered medications prior to visit.    Review of Systems  Constitutional: Negative for chills, fever and weight loss.  HENT: Positive for congestion.   Respiratory: Positive for cough and shortness of breath.   Cardiovascular: Negative for leg swelling.  Gastrointestinal: Positive for heartburn. Negative for blood in stool, nausea and vomiting.   Genitourinary: Negative for hematuria.  Musculoskeletal: Positive for joint pain.  Skin: Negative for rash.  Neurological: Positive for headaches.  Endo/Heme/Allergies: Positive for environmental allergies.     Objective:   Vitals:   07/07/19 1422  BP: (!) 142/80  Pulse: 100  Temp: 98.6 F (37 C)  TempSrc: Oral  SpO2: 100%  Weight: 252 lb 12.8 oz (114.7 kg)  Height: 5' 4"  (1.626 m)   100% on 3 LPM   BMI Readings from Last 3 Encounters:  07/07/19 43.39 kg/m  07/03/19 43.26  kg/m  06/03/19 43.22 kg/m   Wt Readings from Last 3 Encounters:  07/07/19 252 lb 12.8 oz (114.7 kg)  07/03/19 252 lb (114.3 kg)  06/03/19 251 lb 12.8 oz (114.2 kg)    Physical Exam Vitals reviewed.  Constitutional:      General: She is not in acute distress.    Appearance: She is obese. She is not ill-appearing.  HENT:     Head: Normocephalic and atraumatic.  Eyes:     General: No scleral icterus. Cardiovascular:     Rate and Rhythm: Normal rate and regular rhythm.     Heart sounds: No murmur.  Pulmonary:     Comments: Breathing comfortably on 3L [place], mild tachypnea. No conversational dyspnea. Distant breath sounds. Abdominal:     General: There is no distension.     Palpations: Abdomen is soft.     Tenderness: There is no abdominal tenderness.  Musculoskeletal:        General: No swelling or deformity.     Cervical back: Neck supple.  Lymphadenopathy:     Cervical: No cervical adenopathy.  Skin:    General: Skin is warm and dry.     Findings: No rash.  Neurological:     General: No focal deficit present.     Mental Status: She is alert.     Coordination: Coordination normal.  Psychiatric:        Mood and Affect: Mood normal.        Behavior: Behavior normal.      CBC    Component Value Date/Time   WBC 9.9 07/03/2019 1647   RBC 4.77 07/03/2019 1647   HGB 11.6 (L) 07/03/2019 1647   HGB 10.3 (L) 06/03/2019 1441   HCT 37.3 07/03/2019 1647   HCT 34.3 06/03/2019 1441    PLT 366 07/03/2019 1647   PLT 404 06/03/2019 1441   MCV 78.2 (L) 07/03/2019 1647   MCV 75 (L) 06/03/2019 1441   MCH 24.3 (L) 07/03/2019 1647   MCHC 31.1 (L) 07/03/2019 1647   RDW 21.9 (H) 07/03/2019 1647   RDW 23.2 (H) 06/03/2019 1441   LYMPHSABS 2,930 07/03/2019 1647   LYMPHSABS 2.3 06/03/2019 1441   MONOABS 0.4 05/22/2019 0702   EOSABS 168 07/03/2019 1647   EOSABS 0.3 06/03/2019 1441   BASOSABS 30 07/03/2019 1647   BASOSABS 0.0 06/03/2019 1441    CHEMISTRY Recent Labs  Lab 07/03/19 1647  NA 140  K 3.4*  CL 100  CO2 35*  GLUCOSE 95  BUN 12  CREATININE 0.85  CALCIUM 9.3   Estimated Creatinine Clearance: 102.7 mL/min (by C-G formula based on SCr of 0.85 mg/dL).   HIV negative 04/2019 A1AT WNL  Chest Imaging- films reviewed: CT scan reviewed- Paraseptal and centrilobular emphysema with blebs. Possibly increased GGO throughout remaining lung tissue. No PE.  Pulmonary Functions Testing Results: PFT Results Latest Ref Rng & Units 07/03/2019  FVC-Pre L 1.89  FVC-Predicted Pre % 60  FVC-Post L 2.41  FVC-Predicted Post % 76  Pre FEV1/FVC % % 47  Post FEV1/FCV % % 50  FEV1-Pre L 0.89  FEV1-Predicted Pre % 34  FEV1-Post L 1.20  DLCO UNC% % 76  DLCO COR %Predicted % 116  TLC L 4.65  TLC % Predicted % 88  RV % Predicted % 140   2021- severe obstruction with significant bronchodilator reversibility.  Air trapping without hyperinflation or restriction.  Mild diffusion impairment.  Flow volume loop supports mixed obstruction and restriction.  Echocardiogram 05/18/19: LVEF  60-65%, mild LVH, indeterminate diastolic function. Mildly dilated LA. Normal RV function. Trivial AI. Ascending aortic aneurysm.      Assessment & Plan:     ICD-10-CM   1. Severe persistent asthma without complication  F68.12 IgE    montelukast (SINGULAIR) 10 MG tablet    Budeson-Glycopyrrol-Formoterol (BREZTRI AEROSPHERE) 160-9-4.8 MCG/ACT AERO    cetirizine (ZYRTEC ALLERGY) 10 MG tablet  2.  Intrinsic asthma  J45.909 albuterol (PROVENTIL) (2.5 MG/3ML) 0.083% nebulizer solution  3. Iron deficiency anemia, unspecified iron deficiency anemia type  D50.9 ferrous sulfate 325 (65 FE) MG tablet  4. Gastroesophageal reflux disease, unspecified whether esophagitis present  K21.9   5. Chronic respiratory failure with hypoxia (HCC)  J96.11   6. 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  Iron deficiency anemia; ongoing microcytic anemia although improved -refilled iron for 3 more months -follow up with PCP  RTC in 1 month.  >50 min spent on this encounter including chart review, time spent face to face, charting.   Current Outpatient Medications:  .  albuterol (PROVENTIL) (2.5 MG/3ML) 0.083% nebulizer solution, Take 3 mLs (2.5 mg total) by nebulization every 6 (six) hours as needed for wheezing or shortness of breath., Disp: 150 mL, Rfl: 11 .  albuterol (VENTOLIN HFA) 108 (90 Base) MCG/ACT inhaler, Inhale 1-2 puffs into the lungs every 6 (six) hours as needed for  wheezing or shortness of breath (cough)., Disp: 8.5 g, Rfl: 0 .  amLODipine (NORVASC) 10 MG tablet, Take 1 tablet (10 mg total) by mouth daily., Disp: 90 tablet, Rfl: 1 .  aspirin EC 81 MG tablet, Take 81 mg by mouth daily., Disp: , Rfl:  .  budesonide-formoterol (SYMBICORT) 160-4.5 MCG/ACT inhaler, Inhale 2 puffs into the lungs 2 (two) times daily., Disp: 1 Inhaler, Rfl: 6 .  DULoxetine (CYMBALTA) 30 MG capsule, Take 1 capsule (30 mg total) by mouth daily., Disp: 90 capsule, Rfl: 0 .  ferrous sulfate 325 (65 FE) MG tablet, Take 1 tablet (325 mg total) by mouth daily with breakfast., Disp: 30 tablet, Rfl: 2 .  hydrochlorothiazide (HYDRODIURIL) 25 MG tablet, TAKE 1 TABLET (25 MG TOTAL) BY MOUTH DAILY., Disp: 30 tablet, Rfl: 2 .  lisinopril (ZESTRIL) 40 MG tablet, TAKE 1 TABLET (40 MG TOTAL) BY MOUTH DAILY., Disp: 30 tablet, Rfl: 2 .  loratadine (CLARITIN) 10 MG tablet, Take 1 tablet (10 mg total) by mouth daily., Disp: 30 tablet, Rfl: 6 .  Masks MISC, Nebulizer face mask J45.909, Disp: 1 each, Rfl: 0 .  OXYGEN, Inhale into the lungs. 3 liters, Disp: , Rfl:  .  Budeson-Glycopyrrol-Formoterol (BREZTRI AEROSPHERE) 160-9-4.8 MCG/ACT AERO, Inhale 2 puffs into the lungs 2 (two) times daily.,  Disp: 10.7 g, Rfl: 11 .  carvedilol (COREG) 3.125 MG tablet, Take 1 tablet (3.125 mg total) by mouth 2 (two) times daily with a meal., Disp: 60 tablet, Rfl: 1 .  cetirizine (ZYRTEC ALLERGY) 10 MG tablet, Take 1 tablet (10 mg total) by mouth daily., Disp: 30 tablet, Rfl: 11 .  montelukast (SINGULAIR) 10 MG tablet, Take 1 tablet (10 mg total) by mouth at bedtime., Disp: 30 tablet, Rfl: [place] Sael Furches, DO [facility] 07/07/2019 3:06 PM

## 2019-07-08 ENCOUNTER — Other Ambulatory Visit: Payer: Self-pay | Admitting: Critical Care Medicine

## 2019-07-08 ENCOUNTER — Telehealth: Payer: Self-pay | Admitting: Nurse Practitioner

## 2019-07-08 LAB — C-REACTIVE PROTEIN: CRP: 1.3 mg/dL (ref 0.5–20.0)

## 2019-07-08 LAB — IGE: IgE (Immunoglobulin E), Serum: 22 kU/L (ref <=114)

## 2019-07-08 LAB — SEDIMENTATION RATE: Sed Rate: 40 mm/hr — ABNORMAL HIGH (ref 0–20)

## 2019-07-08 NOTE — Telephone Encounter (Signed)
error 

## 2019-07-08 NOTE — Addendum Note (Signed)
Addended by: Lia Foyer R on: 07/08/2019 02:20 PM   Modules accepted: Orders

## 2019-07-08 NOTE — Telephone Encounter (Signed)
PA request was received from (pharmacy): [facility] [phone]  Medication name and strength: Breztri Aerosphere 160-9-4.8 mcg/act Ordering Provider: Dr. Carlis Abbott  Was PA started with [facility]?: yes If yes, please enter KEY: [id number] Medication tried and failed: Breo Ellipta 200-25, Symbicort 160-4.5 Covered Alternatives: Advair Diskus, Advair HFA inhaler, Dulera inhaler, Symbicort inhaler  PA sent to plan, time frame for approval / denial: 5 business days Routing to [facility] for follow-up

## 2019-07-10 ENCOUNTER — Other Ambulatory Visit: Payer: Self-pay | Admitting: Pulmonary Disease

## 2019-07-10 ENCOUNTER — Telehealth: Payer: Self-pay | Admitting: Critical Care Medicine

## 2019-07-10 DIAGNOSIS — R768 Other specified abnormal immunological findings in serum: Secondary | ICD-10-CM

## 2019-07-10 NOTE — Progress Notes (Signed)
Patient identification verified, results of recent lab work reviewed. (ANA)  Per Wyn Quaker FNP, the ANA is positive with titer of 1: 80. Please refer to rheumatology for further evaluation. Patient may have connective tissue disorder also may explain some of her myalgias.  Please place the order for referral to rheumatology. Associated with elevated ANA.  Patient verbalized understanding of results and need for referral to rheumatology.

## 2019-07-13 NOTE — Telephone Encounter (Signed)
Response from [employer]:    Skeet Latch sent to Harland Dingwall, Lenna Sciara; Delfin Edis, [facility],   I have reached out to the Manager who handles the approval for these. I will get back with you as soon as I have an answer for you.   Thanks!

## 2019-07-13 NOTE — Telephone Encounter (Signed)
Sent URGENT CM to [employer] requesting an update for the O2 order.

## 2019-07-15 ENCOUNTER — Ambulatory Visit: Payer: Medicaid Other | Admitting: Nurse Practitioner

## 2019-07-15 NOTE — Telephone Encounter (Signed)
Will keep encounter open while we wait on update.

## 2019-07-16 NOTE — Telephone Encounter (Signed)
F/U request from [employer] has been sent.

## 2019-07-17 NOTE — Telephone Encounter (Signed)
Skeet Latch sent to Melonie [place], Charlotte Sanes   [facility]!   Per the Manager it is still in the approval process. He could not give me a timeframe of when it would be approved.       Previous Messages   ----- Message -----  From: Skeet Latch  Sent: 07/17/2019  8:22 AM EDT  To: Darlina Guys, Kem Kays, *  Subject: RE: [place]!   The Manager said this is being reviewed and still pending approval. I reached out to him again this morning. I will give you another update as soon as I can.   Thanks!

## 2019-07-21 NOTE — Telephone Encounter (Signed)
Failed symbicort.  Please prescribe Advair 500-50 BID.  Thanks!  [name], DO 07/21/19 7:44 PM [facility]

## 2019-07-21 NOTE — Telephone Encounter (Signed)
Checked Cover My Meds to see if we had a determination and one was not showing. Called [facility] and spoke with Eta for status of the PA.  Pt's PA for Judithann Sauger was denied as pt has not tried/failed 2 of the covered alternatives. Dr. Carlis Abbott, please advise.

## 2019-07-22 ENCOUNTER — Telehealth: Payer: Self-pay | Admitting: Critical Care Medicine

## 2019-07-22 MED ORDER — FLUTICASONE-SALMETEROL 500-50 MCG/DOSE IN AEPB
1.0000 | INHALATION_SPRAY | Freq: Two times a day (BID) | RESPIRATORY_TRACT | 11 refills | Status: DC
Start: 2019-07-22 — End: 2019-11-06

## 2019-07-22 MED FILL — ADVAIR 500/50 DISKUS: 500-50 | 30 days supply | Qty: 60 | Fill #0

## 2019-07-22 NOTE — Telephone Encounter (Signed)
Attempted to call pt but unable to reach. Left message for pt to return call. I have pended the Advair Rx. Once pt returns call, we can then send Rx to pharmacy for pt.

## 2019-07-22 NOTE — Telephone Encounter (Signed)
Called and spoke with pt letting her know that we were switching her to Advair as Judithann Sauger is not covered by insurance and she verbalized understanding. Nothing further needed.

## 2019-07-22 NOTE — Telephone Encounter (Signed)
Called and spoke with pt letting her know that insurance was not covering Breztri that we were switching her to Advair and pt verbalized understanding. Verified preferred pharmacy and sent Rx to pharmacy for pt. Nothing further needed.

## 2019-07-27 ENCOUNTER — Telehealth: Payer: Self-pay | Admitting: Critical Care Medicine

## 2019-07-27 NOTE — Telephone Encounter (Signed)
Patient called with questions about a qualifying walk for a POC. Message sent via community messages to [employer], Hebert Soho. Waiting for response.

## 2019-07-29 NOTE — Telephone Encounter (Signed)
I granted you access. Show me how to check that?

## 2019-07-29 NOTE — Telephone Encounter (Signed)
Teresa Mendez - can you please advise if a response has been received from [employer]. I do not have access to your inbox. Thanks.

## 2019-07-31 DIAGNOSIS — R32 Unspecified urinary incontinence: Secondary | ICD-10-CM | POA: Diagnosis not present

## 2019-08-04 NOTE — Telephone Encounter (Signed)
° ° ° °  RE: POC Received: 1 week ago Skeet Latch sent to Darlina Guys; Robbie Louis, RN; Karoline Caldwell,   Per our warehouse manager this POC request is pending approval.   Thanks        Previous Messages    ----- Message -----  From: Darlina Guys  Sent: 07/28/2019  10:54 AM EDT  To: Darlina Guys, Kem Kays, *  Subject: RE: POC                                         Including others for assistance.  Thank you!

## 2019-08-06 NOTE — Telephone Encounter (Signed)
Spoke with pt. She is aware that [employer] is currently working on this for her. Nothing further was needed.

## 2019-08-07 ENCOUNTER — Ambulatory Visit: Payer: Medicaid Other | Attending: Nurse Practitioner | Admitting: Nurse Practitioner

## 2019-08-07 ENCOUNTER — Encounter: Payer: Self-pay | Admitting: Nurse Practitioner

## 2019-08-07 ENCOUNTER — Other Ambulatory Visit: Payer: Self-pay

## 2019-08-07 DIAGNOSIS — I10 Essential (primary) hypertension: Secondary | ICD-10-CM

## 2019-08-07 DIAGNOSIS — H938X1 Other specified disorders of right ear: Secondary | ICD-10-CM

## 2019-08-07 MED ORDER — FLUTICASONE PROPIONATE 50 MCG/ACT NA SUSP: 2.0000 | Freq: Every day | NASAL | 6 refills | Status: DC

## 2019-08-07 MED ORDER — HYDROCHLOROTHIAZIDE 25 MG PO TABS: 25.0000 mg | ORAL_TABLET | Freq: Every day | ORAL | 2 refills | Status: DC

## 2019-08-07 NOTE — Progress Notes (Signed)
Virtual Visit via Telephone Note Due to national recommendations of social distancing due to [place] 19, telehealth visit is felt to be most appropriate for this patient at this time.  I discussed the limitations, risks, security and privacy concerns of performing an evaluation and management service by telephone and the availability of in person appointments. I also discussed with the patient that there may be a patient responsible charge related to this service. The patient expressed understanding and agreed to proceed.    I connected with Teresa Mendez on 08/07/19  at   9:50 AM EDT  EDT by telephone and verified that I am speaking with the correct person using two identifiers.   Consent I discussed the limitations, risks, security and privacy concerns of performing an evaluation and management service by telephone and the availability of in person appointments. I also discussed with the patient that there may be a patient responsible charge related to this service. The patient expressed understanding and agreed to proceed.   Location of Patient: Private Residence   Location of Provider: [place] and [employer] Office    Persons participating in Telemedicine visit: Geryl Rankins FNP-BC [place]    History of Present Illness: Telemedicine visit for: F/U  has a past medical history of Asthma, Hypertension, Sarcoidosis, and Sickle cell trait ([place]).   She is currently being followed by Pulmonology. Having difficulty obtaining her portable O2 concentrator. Apparently the DME company is waiting to be reimbursed by her insurance carrier prior to providing the equipment to the patient.    Ear Pain: Patient presents with right ear pressure.  Symptoms include plugged sensation in the right ear and post nasal drip.  Symptoms began several days ago and are unchanged since that time. Patient denies fever and sore throat. Ear history: 0 previous ear infections.     Essential Hypertension Blood pressure labile. Denies chest pain, shortness of breath, palpitations, lightheadedness, dizziness, headaches or BLE edema. She endorses medication adherence taking amlodipine 10 mg daily and lisinopril 40 mg daily.  BP Readings from Last 3 Encounters:  07/07/19 (!) 142/80  07/03/19 128/72  06/03/19 (!) 145/89       Past Medical History:  Diagnosis Date  . Asthma   . Hypertension   . Sarcoidosis   . Sickle cell trait [facility])     Past Surgical History:  Procedure Laterality Date  . TUBAL LIGATION      Family History  Problem Relation Age of Onset  . Hypertension Mother   . Congestive Heart Failure Mother   . Kidney failure Mother   . Sickle cell trait Mother   . Hypertension Father   . Congestive Heart Failure Father   . Kidney failure Father   . Glaucoma Father     Social History   Socioeconomic History  . Marital status: Single    Spouse name: Not on file  . Number of children: Not on file  . Years of education: Not on file  . Highest education level: Not on file  Occupational History  . Not on file  Tobacco Use  . Smoking status: Former Smoker    Packs/day: 0.50    Types: Cigarettes  . Smokeless tobacco: Never Used  . Tobacco comment: Pt. stated she quit a few months back.   Vaping Use  . Vaping Use: Former  Substance and Sexual Activity  . Alcohol use: Yes    Comment: Occasionally   . Drug use: No  . Sexual  activity: Yes  Other Topics Concern  . Not on file  Social History Narrative  . Not on file   Social Determinants of Health   Financial Resource Strain:   . Difficulty of Paying Living Expenses:   Food Insecurity:   . Worried About Charity fundraiser in the Last Year:   . Arboriculturist in the Last Year:   Transportation Needs:   . Film/video editor (Medical):   Marland Kitchen Lack of Transportation (Non-Medical):   Physical Activity:   . Days of Exercise per Week:   . Minutes of Exercise per Session:   Stress:    . Feeling of Stress :   Social Connections:   . Frequency of Communication with Friends and Family:   . Frequency of Social Gatherings with Friends and Family:   . Attends Religious Services:   . Active Member of Clubs or Organizations:   . Attends Archivist Meetings:   Marland Kitchen Marital Status:      Observations/Objective: Awake, alert and oriented x 3   ROS  Assessment and Plan: [place] was seen today for ear drainage.  Diagnoses and all orders for this visit:  Pressure sensation in right ear -     fluticasone (FLONASE) 50 MCG/ACT nasal spray; Place 2 sprays into both nostrils daily. Avoid smoking or being around other smokers  Essential hypertension -     hydrochlorothiazide (HYDRODIURIL) 25 MG tablet; Take 1 tablet (25 mg total) by mouth daily. Continue all antihypertensives as prescribed.  Remember to bring in your blood pressure log with you for your follow up appointment.  DASH/Mediterranean Diets are healthier choices for HTN.     Follow Up Instructions Return for Call the office or leave a MyChart message if your ear pain unimproved after 3 to 5 days..     I discussed the assessment and treatment plan with the patient. The patient was provided an opportunity to ask questions and all were answered. The patient agreed with the plan and demonstrated an understanding of the instructions.   The patient was advised to call back or seek an in-person evaluation if the symptoms worsen or if the condition fails to improve as anticipated.  I provided 14 minutes of non-face-to-face time during this encounter including median intraservice time, reviewing previous notes, labs, imaging, medications and explaining diagnosis and management.  Teresa Pounds, FNP-BC

## 2019-08-12 ENCOUNTER — Ambulatory Visit: Payer: Medicaid Other | Admitting: Critical Care Medicine

## 2019-08-12 NOTE — Progress Notes (Deleted)
Synopsis: Referred in 2021 for asthma by Teresa Pounds, NP. Formerly a patient of Dr. [name] in 2009.  Subjective:   PATIENT ID: Teresa Mendez GENDER: female DOB: [birth year], MRN: [id number]  No chief complaint on file.   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 She was diagnosed with asthma in her 23s but was recently hospitalized for the first time in April 2021. She was formerly a patient of Dr. [name], who had not been seen in several years prior to being hospitalized in April 2021 for acute respiratory failure due to an acute asthma exacerbation. She has been on Symbicort BID but continues to have a chronic dry cough, which worsens when supine. She has had clear mucus production and ongoing SOB, but denies wheezing. She has been coughing to the point of having incontinence. Her activity is significantly limited due to her breathing. Overall she does not feel that her asthma is well controlled. She had chest pain last week after her PFTs and as seen for an urgent visit. She describes a "stinging" feeling in her chest occasionally. Overall her heartburn has improved with dietary modifications and eating less spicy foods. She continues to have heartburn, but is unsure how many days  per week. She has not been taking medications for this. She has sinus allergy symptoms, with rhinorrhea, congestion, sneezing, and itchy throat. She is currently taking claritin for this.  She has continued using her 3L O2 since discharge from the hospital in April, but was not previously on O2.  Shoulder, knee, and wrist pain bilaterally for a few weeks. No erythema or warmth.  Almost out of iron pills.  Thinks amlodipine is giving her headaches and wants to try something else for her HTN. Still taking HCTZ and lisinopril at max dose. Next PCP appt on 6/16.   Past Medical History:  Diagnosis Date  . Asthma   . Hypertension   . Sarcoidosis   . Sickle cell trait ([facility])      Family History  Problem Relation Age of Onset  . Hypertension Mother   . Congestive Heart Failure Mother   . Kidney failure Mother   . Sickle cell trait Mother   . Hypertension Father   . Congestive Heart Failure Father   . Kidney failure Father   . Glaucoma Father      Past Surgical History:  Procedure Laterality Date  . TUBAL LIGATION      Social History   Socioeconomic History  . Marital status: Single    Spouse name: Not on file  . Number of children: Not on file  . Years of education: Not on file  . Highest education level: Not on file  Occupational History  . Not on file  Tobacco Use  . Smoking status: Former Smoker    Packs/day: 0.50    Types: Cigarettes  . Smokeless tobacco:  Never Used  . Tobacco comment: Pt. stated she quit a few months back.   Vaping Use  . Vaping Use: Former  Substance and Sexual Activity  . Alcohol use: Yes    Comment: Occasionally   . Drug use: No  . Sexual activity: Yes  Other Topics Concern  . Not on file  Social History Narrative  . Not on file   Social Determinants of Health   Financial Resource Strain:   . Difficulty of Paying Living Expenses:   Food Insecurity:   . Worried About Charity fundraiser in the Last Year:   . Arboriculturist in the Last  Year:   Transportation Needs:   . Film/video editor (Medical):   Marland Kitchen Lack of Transportation (Non-Medical):   Physical Activity:   . Days of Exercise per Week:   . Minutes of Exercise per Session:   Stress:   . Feeling of Stress :   Social Connections:   . Frequency of Communication with Friends and Family:   . Frequency of Social Gatherings with Friends and Family:   . Attends Religious Services:   . Active Member of Clubs or Organizations:   . Attends Archivist Meetings:   Marland Kitchen Marital Status:   Intimate Partner Violence:   . Fear of Current or Ex-Partner:   . Emotionally Abused:   Marland Kitchen Physically Abused:   . Sexually Abused:      No Known Allergies   There is no immunization history for the selected administration types on file for this patient.  Outpatient Medications Prior to Visit  Medication Sig Dispense Refill  . albuterol (PROVENTIL) (2.5 MG/3ML) 0.083% nebulizer solution Take 3 mLs (2.5 mg total) by nebulization every 6 (six) hours as needed for wheezing or shortness of breath. 150 mL 11  . albuterol (VENTOLIN HFA) 108 (90 Base) MCG/ACT inhaler Inhale 1-2 puffs into the lungs every 6 (six) hours as needed for wheezing or shortness of breath (cough). 8.5 g 0  . amLODipine (NORVASC) 10 MG tablet Take 1 tablet (10 mg total) by mouth daily. 90 tablet 1  . aspirin EC 81 MG tablet Take 81 mg by mouth daily.    . carvedilol (COREG) 3.125 MG tablet Take 1 tablet (3.125 mg total) by mouth 2 (two) times daily with a meal. 60 tablet 1  . cetirizine (ZYRTEC ALLERGY) 10 MG tablet Take 1 tablet (10 mg total) by mouth daily. 30 tablet 11  . DULoxetine (CYMBALTA) 30 MG capsule Take 1 capsule (30 mg total) by mouth daily. 90 capsule 0  . ferrous sulfate 325 (65 FE) MG tablet Take 1 tablet (325 mg total) by mouth daily with breakfast. 30 tablet 2  . fluticasone (FLONASE) 50 MCG/ACT nasal spray Place 2 sprays into both nostrils daily. 16 g 6  . Fluticasone-Salmeterol (ADVAIR  DISKUS) 500-50 MCG/DOSE AEPB Inhale 1 puff into the lungs in the morning and at bedtime. 60 each 11  . hydrochlorothiazide (HYDRODIURIL) 25 MG tablet Take 1 tablet (25 mg total) by mouth daily. 90 tablet 2  . lisinopril (ZESTRIL) 40 MG tablet TAKE 1 TABLET (40 MG TOTAL) BY MOUTH DAILY. 30 tablet 2  . loratadine (CLARITIN) 10 MG tablet Take 1 tablet (10 mg total) by mouth daily. 30 tablet 6  . Masks MISC Nebulizer face mask J45.909 1 each 0  . montelukast (SINGULAIR) 10 MG tablet Take 1 tablet (10 mg total) by mouth at bedtime. 30 tablet 11  . OXYGEN Inhale into  the lungs. 3 liters     No facility-administered medications prior to visit.    Review of Systems  Constitutional: Negative for chills, fever and weight loss.  HENT: Positive for congestion.   Respiratory: Positive for cough and shortness of breath.   Cardiovascular: Negative for leg swelling.  Gastrointestinal: Positive for heartburn. Negative for blood in stool, nausea and vomiting.  Genitourinary: Negative for hematuria.  Musculoskeletal: Positive for joint pain.  Skin: Negative for rash.  Neurological: Positive for headaches.  Endo/Heme/Allergies: Positive for environmental allergies.     Objective:   There were no vitals filed for this visit.   on 3 LPM   BMI Readings from Last 3 Encounters:  07/07/19 43.39 kg/m  07/03/19 43.26 kg/m  06/03/19 43.22 kg/m   Wt Readings from Last 3 Encounters:  07/07/19 252 lb 12.8 oz (114.7 kg)  07/03/19 252 lb (114.3 kg)  06/03/19 251 lb 12.8 oz (114.2 kg)    Physical Exam   CBC    Component Value Date/Time   WBC 9.9 07/03/2019 1647   RBC 4.77 07/03/2019 1647   HGB 11.6 (L) 07/03/2019 1647   HGB 10.3 (L) 06/03/2019 1441   HCT 37.3 07/03/2019 1647   HCT 34.3 06/03/2019 1441   PLT 366 07/03/2019 1647   PLT 404 06/03/2019 1441   MCV 78.2 (L) 07/03/2019 1647   MCV 75 (L) 06/03/2019 1441   MCH 24.3 (L) 07/03/2019 1647   MCHC 31.1 (L) 07/03/2019 1647   RDW 21.9 (H)  07/03/2019 1647   RDW 23.2 (H) 06/03/2019 1441   LYMPHSABS 2,930 07/03/2019 1647   LYMPHSABS 2.3 06/03/2019 1441   MONOABS 0.4 05/22/2019 0702   EOSABS 168 07/03/2019 1647   EOSABS 0.3 06/03/2019 1441   BASOSABS 30 07/03/2019 1647   BASOSABS 0.0 06/03/2019 1441    CHEMISTRY No results for input(s): NA, K, CL, CO2, GLUCOSE, BUN, CREATININE, CALCIUM, MG, PHOS in the last 168 hours. CrCl cannot be calculated (Patient's most recent lab result is older than the maximum 21 days allowed.).   HIV negative 04/2019 A1AT WNL  07/07/19: ESR 40 CRP 1.3 IgE 22  Chest Imaging- films reviewed: CT scan reviewed- Paraseptal and centrilobular emphysema with blebs. Possibly increased GGO throughout remaining lung tissue. No PE.  Pulmonary Functions Testing Results: PFT Results Latest Ref Rng & Units 07/03/2019  FVC-Pre L 1.89  FVC-Predicted Pre % 60  FVC-Post L 2.41  FVC-Predicted Post % 76  Pre FEV1/FVC % % 47  Post FEV1/FCV % % 50  FEV1-Pre L 0.89  FEV1-Predicted Pre % 34  FEV1-Post L 1.20  DLCO UNC% % 76  DLCO COR %Predicted % 116  TLC L 4.65  TLC % Predicted % 88  RV % Predicted % 140   2021- severe obstruction with significant bronchodilator reversibility.  Air trapping without hyperinflation or restriction.  Mild diffusion impairment.  Flow volume loop supports mixed obstruction and restriction.  Echocardiogram 05/18/19: LVEF 60-65%, mild LVH, indeterminate diastolic function. Mildly dilated LA. Normal RV function. Trivial AI. Ascending aortic aneurysm.      Assessment & Plan:   No diagnosis found.   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  Iron deficiency anemia; ongoing microcytic anemia although improved -refilled iron for 3 more months -follow up with PCP  RTC in 1 month.  >50 min spent on this encounter including chart review, time spent face to face, charting.   Current Outpatient Medications:  .  albuterol (PROVENTIL) (2.5 MG/3ML) 0.083% nebulizer solution, Take 3 mLs (2.5 mg total) by nebulization every 6 (six) hours as needed for wheezing or shortness of breath., Disp: 150 mL, Rfl: 11 .  albuterol (VENTOLIN HFA) 108 (90 Base) MCG/ACT inhaler, Inhale 1-2 puffs into the lungs every 6 (six) hours as needed for wheezing or shortness of breath (cough)., Disp: 8.5 g, Rfl: 0 .  amLODipine (NORVASC) 10 MG tablet, Take 1 tablet (10 mg total) by mouth daily., Disp: 90 tablet, Rfl: 1 .  aspirin EC 81 MG tablet, Take 81 mg by mouth daily., Disp: , Rfl:  .  carvedilol (COREG) 3.125 MG tablet, Take 1 tablet (3.125 mg total) by mouth 2 (two) times daily with a meal., Disp: 60 tablet, Rfl: 1 .  cetirizine (ZYRTEC ALLERGY) 10 MG tablet, Take 1 tablet (10 mg total) by mouth daily., Disp: 30 tablet, Rfl: 11 .  DULoxetine (CYMBALTA) 30 MG capsule, Take 1 capsule (30 mg total) by mouth daily., Disp: 90 capsule, Rfl: 0 .  ferrous sulfate 325 (65 FE) MG tablet, Take 1 tablet (325 mg total) by mouth daily with breakfast., Disp: 30 tablet, Rfl: 2 .  fluticasone (FLONASE) 50 MCG/ACT nasal spray, Place  2 sprays into both nostrils daily., Disp: 16 g, Rfl: 6 .  Fluticasone-Salmeterol (ADVAIR DISKUS) 500-50 MCG/DOSE AEPB, Inhale 1 puff into the lungs in the morning and at bedtime., Disp: 60 each, Rfl: 11 .  hydrochlorothiazide (HYDRODIURIL) 25 MG tablet, Take 1 tablet (25 mg total) by mouth daily., Disp: 90 tablet, Rfl: 2 .  lisinopril (ZESTRIL) 40 MG tablet, TAKE 1 TABLET (40 MG TOTAL) BY MOUTH DAILY., Disp: 30 tablet, Rfl: 2 .  loratadine (CLARITIN) 10 MG tablet, Take 1 tablet (10 mg total) by mouth daily., Disp: 30 tablet, Rfl: 6 .  Masks MISC, Nebulizer face mask J45.909, Disp: 1 each, Rfl: 0 .  montelukast (SINGULAIR) 10 MG tablet, Take 1 tablet (10 mg total) by mouth at bedtime., Disp: 30 tablet, Rfl: 11 .  OXYGEN, Inhale into the lungs. 3 liters, Disp: , Rfl:      LJulian Hy DO [facility] 08/12/2019 7:26 AM

## 2019-08-19 ENCOUNTER — Encounter: Payer: Self-pay | Admitting: Nurse Practitioner

## 2019-08-26 ENCOUNTER — Telehealth: Payer: Self-pay | Admitting: Critical Care Medicine

## 2019-08-26 ENCOUNTER — Telehealth: Payer: Self-pay

## 2019-08-26 NOTE — Telephone Encounter (Signed)
Call placed to [facility] to check on status of POC order.  Spoke to [facility] who stated that they do not have POCs and there is no pending order in the patient's account.  She stated that the patient was advised on 08/04/2019 that they don't have POC.  Call placed to patient # [id number] to discuss POC status, and questions about disability.   message left with call back requested to this CM # [phone]  Call placed to [facility], spoke to [facility] and informed her of above call to [place].  She will route this message to patient care coordinator.  Call back to this CM # [phone]

## 2019-08-27 NOTE — Telephone Encounter (Signed)
Spoke with the pt and scheduled ov with [facility] for qualifying walk test for POC.

## 2019-08-27 NOTE — Telephone Encounter (Addendum)
I called [facility] and spoke to Tiffany.  She verified pt does not have O2 with them and if we send order to them for portable O2 tanks pt will need qualifying walk.  Last order placed for pt was on 6/9 for poc & was sent to [facility]. I don't see where pt had walk.  I will route message to triage to see if pt has had walk in past 30 days & to schedule if she has not had one.  Will need order for [facility] once walk is done.

## 2019-08-30 DIAGNOSIS — R32 Unspecified urinary incontinence: Secondary | ICD-10-CM | POA: Diagnosis not present

## 2019-09-07 ENCOUNTER — Other Ambulatory Visit: Payer: Self-pay

## 2019-09-07 ENCOUNTER — Ambulatory Visit (INDEPENDENT_AMBULATORY_CARE_PROVIDER_SITE_OTHER): Payer: Medicaid Other | Admitting: Pulmonary Disease

## 2019-09-07 ENCOUNTER — Encounter: Payer: Self-pay | Admitting: Pulmonary Disease

## 2019-09-07 VITALS — BP 130/70 | HR 83 | Temp 97.3°F | Ht 64.0 in | Wt 254.0 lb

## 2019-09-07 DIAGNOSIS — D869 Sarcoidosis, unspecified: Secondary | ICD-10-CM | POA: Diagnosis not present

## 2019-09-07 DIAGNOSIS — I10 Essential (primary) hypertension: Secondary | ICD-10-CM | POA: Diagnosis not present

## 2019-09-07 DIAGNOSIS — J45909 Unspecified asthma, uncomplicated: Secondary | ICD-10-CM

## 2019-09-07 DIAGNOSIS — K219 Gastro-esophageal reflux disease without esophagitis: Secondary | ICD-10-CM | POA: Diagnosis not present

## 2019-09-07 DIAGNOSIS — J438 Other emphysema: Secondary | ICD-10-CM

## 2019-09-07 DIAGNOSIS — J849 Interstitial pulmonary disease, unspecified: Secondary | ICD-10-CM | POA: Insufficient documentation

## 2019-09-07 DIAGNOSIS — R918 Other nonspecific abnormal finding of lung field: Secondary | ICD-10-CM | POA: Insufficient documentation

## 2019-09-07 MED ORDER — OMEPRAZOLE 20 MG PO CPDR: 20.0000 mg | DELAYED_RELEASE_CAPSULE | Freq: Every day | ORAL | 4 refills | Status: DC

## 2019-09-07 MED FILL — OMEPRAZOLE 20 MG CAP: 20 | 30 days supply | Qty: 30 | Fill #0

## 2019-09-07 NOTE — Assessment & Plan Note (Signed)
Plan: We will repeat CT of chest in September/2021

## 2019-09-07 NOTE — Assessment & Plan Note (Signed)
Patient with history of poor blood pressure control Better controlled blood pressure today in office, patient noticed worsening fatigue since starting beta-blocker  Plan: We will have patient schedule in office visit with primary care to further evaluate and discuss Patient probably needs change in blood pressure medication management, goal would be stopping beta-blocker

## 2019-09-07 NOTE — Assessment & Plan Note (Signed)
Plan: We will repeat CT chest in September/2021 Follow-up with our office in 3 months

## 2019-09-07 NOTE — Assessment & Plan Note (Signed)
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

## 2019-09-07 NOTE — Assessment & Plan Note (Signed)
Plan: Continue Advair 500 Follow-up in 3 months with Dr. Carlis Abbott Continue oxygen therapy 1 L continuous or 1 L pulsed

## 2019-09-07 NOTE — Assessment & Plan Note (Signed)
Patient reporting worsening acid reflux Not currently on PPI  Plan: Start omeprazole

## 2019-09-07 NOTE — Assessment & Plan Note (Signed)
Plan: Continue Advair 500 We will order CT of chest to be completed in September/2021

## 2019-09-07 NOTE — Addendum Note (Signed)
Addended byCoralie Keens on: 09/07/2019 02:10 PM   Modules accepted: Orders

## 2019-09-07 NOTE — Patient Instructions (Addendum)
You were seen today by Lauraine Rinne, NP  for:   1. Intrinsic asthma  Continue Advair 500 as prescribed by Dr. Carlis Abbott  Continue to rinse mouth out after use  Continue Singulair daily  Continue Zyrtec  2. Gastroesophageal reflux disease, unspecified whether esophagitis present  - omeprazole (PRILOSEC) 20 MG capsule; Take 1 capsule (20 mg total) by mouth daily.  Dispense: 30 capsule; Refill: 4  Omeprazole 20 mg tablet  >>>Please take 1 tablet daily 15 minutes to 30 minutes before your first meal of the day as well as before your other medications >>>Try to take at the same time each day >>>take this medication daily  GERD management: >>>Avoid laying flat until 2 hours after meals >>>Elevate head of the bed including entire chest >>>Reduce size of meals and amount of fat, acid, spices, caffeine and sweets >>>If you are smoking, Please stop! >>>Decrease alcohol consumption >>>Work on maintaining a healthy weight with normal BMI    3. PULMONARY SARCOIDOSIS  Walk today in office today, patient qualified for POC needing 1 L pulsed  Continue oxygen therapy as prescribed  >>>maintain oxygen saturations greater than 88 percent  >>>if unable to maintain oxygen saturations please contact the office  >>>do not smoke with oxygen  >>>can use nasal saline gel or nasal saline rinses to moisturize nose if oxygen causes dryness   4. Abnormal findings on diagnostic imaging of lung 5. Interstitial pulmonary disease ([place]) 6. Ground glass opacity present on imaging of lung  - CT Chest Wo Contrast; Future  We will repeat a CT of your chest in late September/2021 please complete this prior to presenting back to our office in October/2021  7. Essential hypertension  Suspect your worsened fatigue may be due to the fact that you are now on a beta-blocker  Please schedule a an office visit for further evaluation with primary care to see if they can adjust your blood pressure medications   We  recommend today:  Orders Placed This Encounter  Procedures  . CT Chest Wo Contrast    Standing Status:   Future    Standing Expiration Date:   09/06/2020    Scheduling Instructions:     Complete in end of sept /2021    Order Specific Question:   Is patient pregnant?    Answer:   No    Order Specific Question:   Preferred imaging location?    Answer:   [address]    Order Specific Question:   Release to patient    Answer:   Immediate    Order Specific Question:   Radiology Contrast Protocol - do NOT remove file path    Answer:   [email or web address]   Orders Placed This Encounter  Procedures  . CT Chest Wo Contrast   Meds ordered this encounter  Medications  . omeprazole (PRILOSEC) 20 MG capsule    Sig: Take 1 capsule (20 mg total) by mouth daily.    Dispense:  30 capsule    Refill:  4    Follow Up:    Return in about 3 months (around 12/08/2019), or if symptoms worsen or fail to improve, for After Chest CT, Follow up with Dr. Carlis Abbott.   Please do your part to reduce the spread of COVID-19:      Reduce your risk of any infection  and COVID19 by using the similar precautions used for avoiding the common cold or flu:  Marland Kitchen Wash your hands often with soap and  warm water for at least 20 seconds.  If soap and water are not readily available, use an alcohol-based hand sanitizer with at least 60% alcohol.  . If coughing or sneezing, cover your mouth and nose by coughing or sneezing into the elbow areas of your shirt or coat, into a tissue or into your sleeve (not your hands). Langley Gauss A MASK when in public  . Avoid shaking hands with others and consider head nods or verbal greetings only. . Avoid touching your eyes, nose, or mouth with unwashed hands.  . Avoid close contact with people who are sick. . Avoid places or events with large numbers of people in one location, like concerts or sporting events. . If you have some symptoms but not all symptoms,  continue to monitor at home and seek medical attention if your symptoms worsen. . If you are having a medical emergency, call 911.   [place] / e-Visit: [email or web address]         [facility]: [facility]: [phone]                   [facility]: [phone]     It is flu season:   >>> Best ways to protect herself from the flu: Receive the yearly flu vaccine, practice good hand hygiene washing with soap and also using hand sanitizer when available, eat a nutritious meals, get adequate rest, hydrate appropriately   Please contact the office if your symptoms worsen or you have concerns that you are not improving.   Thank you for choosing [facility] for your healthcare, and for allowing [place] to partner with you on your healthcare journey. I am thankful to be able to provide care to you today.   Wyn Quaker FNP-C

## 2019-09-07 NOTE — Progress Notes (Signed)
_0  ID: Teresa Mendez, female    DOB: 03/13/1975, 44 y.o.   MRN: [id number]  Chief Complaint  Patient presents with  . Follow-up    SOB    Referring provider: [name], NP  HPI:  44 year old female former smoker followed in our office for asthma, emphysema and pulmonary sarcoid.  PMH: Hypertension Smoker/ Smoking History: Former smoker Maintenance: Advair 500 Pt of: Dr. Carlis Abbott  09/07/2019  - Visit   44 year old female former smoker followed in our office for asthma and pulmonary sarcoid.  Followed in our office by Dr. Carlis Abbott.  Patient presenting today to see if she can qualify for a portable oxygen concentrator.  Patient was walked in office.  Patient was last seen in our office in June/2021 by Dr. Carlis Abbott.  It was recommended that time she obtain lab work: ESR, CRP.  Patient continue supplemental oxygen 3 L continuously for now.  Patient was to start montelukast, Breztri and obtain an IgE lab test.  Patient to switch Claritin to Zyrtec.  Patient needs evaluation of GERD and likely needs PPI.  Patient was instructed to follow-up with primary care and start Coreg for better management of hypertension.    Patient is not taking Breztri.  Patient is currently taking Advair 500 prescribed by Dr. Carlis Abbott.  Patient was walked in office today and did qualify for a POC.  She required 1 L pulsed with physical exertion.  Patient reports that overall she is been doing well since last being seen.  She reports adherence to her Advair 500, montelukast, Zyrtec.  She is completed a televisit as a follow-up with primary care.  Unfortunately they did not directly focus on management of her hypertension as this was an acute televisit for ear pain.  Patient's blood pressure is better controlled today.  But she has noticed worsened fatigue since starting Coreg.  We will discuss this today.  Questionaires / Pulmonary Flowsheets:   ACT:  No flowsheet data found.  MMRC: No flowsheet data  found.  Epworth:  No flowsheet data found.  Tests:   05/17/2019-CTA chest-bullous emphysematous changes in upper lobes, groundglass opacity posterior laterally in left lung base could reflect early infiltrate or pneumonia, no evidence of PE  07/03/2019-chest x-ray-no acute cardiopulmonary disease  05/18/2019-echocardiogram-LV ejection fraction 60 to 65%, mild LVH, right ventricular systolic function is normal  07/14/2007-pulmonary function test-FVC 3.29 (89% predicted), postbronchodilator ratio 57, postbronchodilator FEV1 2.05 (70% predicted), positive bronchodilator response in FEV1, mid flow reversibility, DLCO 15.1 (58% predicted)  07/03/2019-pulmonary function test-FVC 1.89 (60% predicted), postbronchodilator ratio 50, postbronchodilator FEV1 1.20 (47% duty), positive bronchodilator response, mid flow reversibility, DLCO 17.25 (76% predicted)  07/07/2019-IgE-22 07/07/2019-C-reactive protein-1.3 07/07/2019-sed rate-40   FENO:  No results found for: NITRICOXIDE  PFT: PFT Results Latest Ref Rng & Units 07/03/2019  FVC-Pre L 1.89  FVC-Predicted Pre % 60  FVC-Post L 2.41  FVC-Predicted Post % 76  Pre FEV1/FVC % % 47  Post FEV1/FCV % % 50  FEV1-Pre L 0.89  FEV1-Predicted Pre % 34  FEV1-Post L 1.20  DLCO uncorrected ml/min/mmHg 17.25  DLCO UNC% % 76  DLCO corrected ml/min/mmHg 19.39  DLCO COR %Predicted % 86  DLVA Predicted % 116  TLC L 4.65  TLC % Predicted % 88  RV % Predicted % 140    WALK:  SIX MIN WALK 07/08/2019 07/03/2019  Supplimental Oxygen during Test? (L/min) Yes Yes  O2 Flow Rate 3 3  Type Pulse Continuous  Tech Comments: Patient walked slow pace  and maintained good O2 sats Pt walked at a slow pace wheeling O2 tank. Pt had mild SOB, lower back pain, and mild CP and was only able to walk 1 lap.    Imaging: No results found.  Lab Results:  CBC    Component Value Date/Time   WBC 9.9 07/03/2019 1647   RBC 4.77 07/03/2019 1647   HGB 11.6 (L) 07/03/2019 1647   HGB  10.3 (L) 06/03/2019 1441   HCT 37.3 07/03/2019 1647   HCT 34.3 06/03/2019 1441   PLT 366 07/03/2019 1647   PLT 404 06/03/2019 1441   MCV 78.2 (L) 07/03/2019 1647   MCV 75 (L) 06/03/2019 1441   MCH 24.3 (L) 07/03/2019 1647   MCHC 31.1 (L) 07/03/2019 1647   RDW 21.9 (H) 07/03/2019 1647   RDW 23.2 (H) 06/03/2019 1441   LYMPHSABS 2,930 07/03/2019 1647   LYMPHSABS 2.3 06/03/2019 1441   MONOABS 0.4 05/22/2019 0702   EOSABS 168 07/03/2019 1647   EOSABS 0.3 06/03/2019 1441   BASOSABS 30 07/03/2019 1647   BASOSABS 0.0 06/03/2019 1441    BMET    Component Value Date/Time   NA 140 07/03/2019 1647   NA 142 06/03/2019 1441   K 3.4 (L) 07/03/2019 1647   CL 100 07/03/2019 1647   CO2 35 (H) 07/03/2019 1647   GLUCOSE 95 07/03/2019 1647   BUN 12 07/03/2019 1647   BUN 11 06/03/2019 1441   CREATININE 0.85 07/03/2019 1647   CALCIUM 9.3 07/03/2019 1647   GFRNONAA 69 06/03/2019 1441   GFRAA 80 06/03/2019 1441    BNP    Component Value Date/Time   BNP 10 07/03/2019 1647    ProBNP No results found for: PROBNP  Specialty Problems      Pulmonary Problems   DYSPNEA    Qualifier: Diagnosis of  By: Doy Mince LPN, Megan        Intrinsic asthma    Qualifier: Diagnosis of  By: [name] MD, Armando Reichert       Acute respiratory failure with hypoxia ([place])   Community acquired pneumonia   Emphysema lung ([place])   Interstitial pulmonary disease ([place])      No Known Allergies  There is no immunization history for the selected administration types on file for this patient.  Past Medical History:  Diagnosis Date  . Asthma   . Hypertension   . Sarcoidosis   . Sickle cell trait ([place])     Tobacco History: Social History   Tobacco Use  Smoking Status Former Smoker  . Packs/day: 0.50  . Types: Cigarettes  Smokeless Tobacco Never Used  Tobacco Comment   Pt. stated she quit a few months back.    Counseling given: Not Answered Comment: Pt. stated she quit a few months back.     Continue to not smoke  Outpatient Encounter Medications as of 09/07/2019  Medication Sig  . albuterol (PROVENTIL) (2.5 MG/3ML) 0.083% nebulizer solution Take 3 mLs (2.5 mg total) by nebulization every 6 (six) hours as needed for wheezing or shortness of breath.  Marland Kitchen albuterol (VENTOLIN HFA) 108 (90 Base) MCG/ACT inhaler Inhale 1-2 puffs into the lungs every 6 (six) hours as needed for wheezing or shortness of breath (cough).  Marland Kitchen aspirin EC 81 MG tablet Take 81 mg by mouth daily.  . carvedilol (COREG) 3.125 MG tablet Take 1 tablet (3.125 mg total) by mouth 2 (two) times daily with a meal.  . cetirizine (ZYRTEC ALLERGY) 10 MG tablet Take 1 tablet (10 mg total)  by mouth daily.  . fluticasone (FLONASE) 50 MCG/ACT nasal spray Place 2 sprays into both nostrils daily.  . Fluticasone-Salmeterol (ADVAIR DISKUS) 500-50 MCG/DOSE AEPB Inhale 1 puff into the lungs in the morning and at bedtime.  . hydrochlorothiazide (HYDRODIURIL) 25 MG tablet Take 1 tablet (25 mg total) by mouth daily.  Marland Kitchen lisinopril (ZESTRIL) 40 MG tablet TAKE 1 TABLET (40 MG TOTAL) BY MOUTH DAILY.  Marland Kitchen loratadine (CLARITIN) 10 MG tablet Take 1 tablet (10 mg total) by mouth daily.  . Masks MISC Nebulizer face mask J45.909  . montelukast (SINGULAIR) 10 MG tablet Take 1 tablet (10 mg total) by mouth at bedtime.  . OXYGEN Inhale into the lungs. 3 liters  . amLODipine (NORVASC) 10 MG tablet Take 1 tablet (10 mg total) by mouth daily.  . DULoxetine (CYMBALTA) 30 MG capsule Take 1 capsule (30 mg total) by mouth daily.  . ferrous sulfate 325 (65 FE) MG tablet Take 1 tablet (325 mg total) by mouth daily with breakfast.  . omeprazole (PRILOSEC) 20 MG capsule Take 1 capsule (20 mg total) by mouth daily.   No facility-administered encounter medications on file as of 09/07/2019.     Review of Systems  Review of Systems  Constitutional: Positive for fatigue. Negative for activity change and fever.  HENT: Negative for sinus pressure, sinus pain and  sore throat.   Respiratory: Positive for cough. Negative for shortness of breath and wheezing.   Cardiovascular: Negative for chest pain and palpitations.  Gastrointestinal: Negative for diarrhea, nausea and vomiting.       +gerd  Musculoskeletal: Negative for arthralgias.  Neurological: Positive for headaches. Negative for dizziness.  Psychiatric/Behavioral: Negative for sleep disturbance. The patient is not nervous/anxious.      Physical Exam  BP 130/70 (BP Location: Left Arm, Cuff Size: Normal)   Pulse 83   Temp (!) 97.3 F (36.3 C) (Oral)   Ht _0  (1.626 m)   Wt 254 lb (115.2 kg)   SpO2 99%   BMI 43.60 kg/m   Wt Readings from Last 5 Encounters:  09/07/19 254 lb (115.2 kg)  07/07/19 252 lb 12.8 oz (114.7 kg)  07/03/19 252 lb (114.3 kg)  06/03/19 251 lb 12.8 oz (114.2 kg)  05/29/19 249 lb 9.6 oz (113.2 kg)    BMI Readings from Last 5 Encounters:  09/07/19 43.60 kg/m  07/07/19 43.39 kg/m  07/03/19 43.26 kg/m  06/03/19 43.22 kg/m  05/29/19 42.84 kg/m     Physical Exam Vitals and nursing note reviewed.  Constitutional:      General: She is not in acute distress.    Appearance: Normal appearance. She is obese.  HENT:     Head: Normocephalic and atraumatic.     Right Ear: External ear normal.     Left Ear: External ear normal.     Nose: Nose normal.     Mouth/Throat:     Mouth: Mucous membranes are moist.     Pharynx: Oropharynx is clear.  Eyes:     Pupils: Pupils are equal, round, and reactive to light.  Cardiovascular:     Rate and Rhythm: Normal rate and regular rhythm.     Pulses: Normal pulses.     Heart sounds: Normal heart sounds. No murmur heard.   Pulmonary:     Effort: Pulmonary effort is normal. No respiratory distress.     Breath sounds: No decreased air movement. No decreased breath sounds, wheezing or rales.  Musculoskeletal:     Cervical back: Normal  range of motion.     Right lower leg: No edema.     Left lower leg: No edema.   Skin:    General: Skin is warm and dry.     Capillary Refill: Capillary refill takes less than 2 seconds.  Neurological:     General: No focal deficit present.     Mental Status: She is alert and oriented to person, place, and time. Mental status is at baseline.     Gait: Gait normal.  Psychiatric:        Mood and Affect: Mood normal.        Behavior: Behavior normal.        Thought Content: Thought content normal.        Judgment: Judgment normal.       Assessment & Plan:   Essential hypertension Patient with history of poor blood pressure control Better controlled blood pressure today in office, patient noticed worsening fatigue since starting beta-blocker  Plan: We will have patient schedule in office visit with primary care to further evaluate and discuss Patient probably needs change in blood pressure medication management, goal would be stopping beta-blocker  Intrinsic asthma Plan: Continue Advair 500 Follow-up in 3 months with Dr. 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 Carlis Abbott.   Lauraine Rinne, NP 09/07/2019   This appointment required 32 minutes of patient care (this includes precharting, chart review, review of results, face-to-face care, etc.).

## 2019-09-15 MED FILL — OMEPRAZOLE 20 MG CAP: 20 | 30 days supply | Qty: 30 | Fill #0

## 2019-09-24 ENCOUNTER — Telehealth: Payer: Self-pay | Admitting: Critical Care Medicine

## 2019-09-24 NOTE — Telephone Encounter (Signed)
ATC patient left message letting her know that order for POC was placed on 09/07/19 due to Covid a lot of the medical supply companies have either been short on POC's or out of them completely due to more people needing oxygen than before and that I have reached out to [employer] to get an update and that once we heard something back we would let her know. Community message sent to [employer]. Will wait for update.

## 2019-09-25 ENCOUNTER — Telehealth: Payer: Self-pay | Admitting: Critical Care Medicine

## 2019-09-25 ENCOUNTER — Telehealth: Payer: Self-pay | Admitting: Nurse Practitioner

## 2019-09-25 NOTE — Telephone Encounter (Signed)
Called and spoke with patient to let her know that we got an update from [employer] and that they would be reaching out to her. She expressed understanding. Below is update from [place] at [facility].  Hello Teresa Mendez,   The POC has finally been approved. Someone will be reaching out to the patient to schedule a pick up for the current homefill station and to deliver the [place].   Nothing further needed at this time.

## 2019-09-25 NOTE — Telephone Encounter (Signed)
Called and spoke with patient to let her know that I should have been more specific when I told her that [employer] was going to reach out to her in regards to her POC. She is asking if her POC is covered by medicaid. I told her that she would have to discuss those options with [employer] when they called her to set everything up. Also told her that if she did have to pay anything they could possibly do a best fit eval to see what option is best for her. She expressed understanding. Nothing further needed at this time.

## 2019-09-25 NOTE — Telephone Encounter (Signed)
ATC several times and got busy signal every time

## 2019-09-25 NOTE — Telephone Encounter (Signed)
Please advise.  Copied from [place] [phone]. Topic: General - Other >> Sep 23, 2019  3:38 PM Yvette Rack wrote: Reason for CRM: June with [employer] called for an update on when the paperwork that was faxed on 09/11/19 will be signed and sent back. June stated it is regarding the patient incontinence supplies. Cb# [phone]

## 2019-09-29 ENCOUNTER — Other Ambulatory Visit: Payer: Self-pay

## 2019-09-29 ENCOUNTER — Ambulatory Visit: Payer: Medicaid Other | Admitting: Critical Care Medicine

## 2019-09-29 ENCOUNTER — Encounter: Payer: Self-pay | Admitting: Critical Care Medicine

## 2019-09-29 VITALS — BP 140/100 | HR 83 | Temp 97.3°F | Ht 64.0 in | Wt 254.6 lb

## 2019-09-29 DIAGNOSIS — J4550 Severe persistent asthma, uncomplicated: Secondary | ICD-10-CM

## 2019-09-29 DIAGNOSIS — D869 Sarcoidosis, unspecified: Secondary | ICD-10-CM

## 2019-09-29 NOTE — Patient Instructions (Addendum)
Thank you for visiting Dr. Carlis Abbott at [facility]. We recommend the following: Orders Placed This Encounter  Procedures  . Ambulatory referral to Ophthalmology   Orders Placed This Encounter  Procedures  . Ambulatory referral to Ophthalmology    Referral Priority:   Routine    Referral Type:   Consultation    Referral Reason:   Specialty Services Required    Requested Specialty:   Ophthalmology    Number of Visits Requested:   1    Keep all of your medications the same. Call [employer] about your oxygen concentrator. We will try to help with this.    Return in about 3 months (around 12/29/2019).    Please do your part to reduce the spread of COVID-19.

## 2019-09-29 NOTE — Progress Notes (Signed)
Synopsis: Referred in 2021 for asthma by Teresa Pounds, NP. Formerly a patient of Dr. [name] in 2009.  Subjective:   PATIENT ID: Teresa Mendez GENDER: female DOB: 18-Dec-1975, MRN: [id number]  Chief Complaint  Patient presents with  . Follow-up    3 liters all the time     [name] is a 44 y/o woman who presents for follow-up of chronic hypoxic respiratory failure due to COPD and sarcoidosis (previously biopsy-proven in 2009).  She has been doing well since her last visit with NP MAC on 09/07/2019.  She has been started on a PPI since that time, and continues on Zyrtec, montelukast, and Advair 500.  She still using 3 L supplemental oxygen due to dropping to 90% on 1 L at home and tachycardia.  She is concerned that her oxygen concentrator at home is not functioning correctly to fill her tanks.  Recently she denies wheezing, coughing, palpitations, syncope, chest pain, rashes, leg edema.  She occasionally has heartburn, but much less than previously.  She sometimes has left eye irritation and blurry vision.  She has not seen an ophthalmologist recently.  No recent treatment for sarcoidosis.    OV 07/07/19: Ms. 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 She was diagnosed with asthma in her 57s but was recently hospitalized for the first time in April 2021. She was formerly a patient of Dr. [name], who had not been seen in several years prior to being hospitalized in April 2021 for acute respiratory failure due to an acute asthma exacerbation. She has been on Symbicort BID but continues to have a chronic dry cough, which worsens when supine. She has had clear mucus production and ongoing SOB, but denies wheezing. She has been coughing to the point of having incontinence. Her activity is significantly limited due to her breathing. Overall she does not feel that her asthma is well controlled. She had chest pain  last week after her PFTs and as seen for an urgent visit. She describes a "stinging" feeling in her chest occasionally. Overall her heartburn has improved with dietary modifications and eating less spicy foods. She continues to have heartburn, but is unsure how many days per week. She has not been taking medications for this. She has sinus allergy symptoms, with rhinorrhea, congestion, sneezing, and itchy throat. She is currently taking claritin for this.  She has continued using her 3L O2 since discharge from the hospital in April, but was not previously on O2.  Shoulder, knee, and wrist pain bilaterally for a few weeks. No erythema or warmth.  Almost out of iron pills.  Thinks amlodipine is giving her headaches and wants to try something else for her HTN. Still taking HCTZ and lisinopril at max dose. Next PCP appt on 6/16.   Past Medical History:  Diagnosis Date  . Asthma   . Hypertension   . Sarcoidosis   . Sickle cell trait (HCC)      Family History  Problem Relation Age of Onset  . Hypertension Mother   . Congestive Heart Failure Mother   . Kidney failure Mother   . Sickle cell trait Mother   . Hypertension Father   . Congestive Heart Failure Father   . Kidney failure Father   . Glaucoma Father      Past Surgical History:  Procedure Laterality Date  . TUBAL LIGATION      Social History  Socioeconomic History  . Marital status: Single    Spouse name: Not on file  . Number of children: Not on file  . Years of education: Not on file  . Highest education level: Not on file  Occupational History  . Not on file  Tobacco Use  . Smoking status: Former Smoker    Packs/day: 0.50    Types: Cigarettes    Quit date: 01/03/2019    Years since quitting: 0.7  . Smokeless tobacco: Never Used  Vaping Use  . Vaping Use: Former  Substance and Sexual Activity  . Alcohol use: Yes    Comment: Occasionally   . Drug use: No  . Sexual activity: Yes  Other Topics Concern  . Not on  file  Social History Narrative  . Not on file   Social Determinants of Health   Financial Resource Strain:   . Difficulty of Paying Living Expenses: Not on file  Food Insecurity:   . Worried About Charity fundraiser in the Last Year: Not on file  . Ran Out of Food in the Last Year: Not on file  Transportation Needs:   . Lack of Transportation (Medical): Not on file  . Lack of Transportation (Non-Medical): Not on file  Physical Activity:   . Days of Exercise per Week: Not on file  . Minutes of Exercise per Session: Not on file  Stress:   . Feeling of Stress : Not on file  Social Connections:   . Frequency of Communication with Friends and Family: Not on file  . Frequency of Social Gatherings with Friends and Family: Not on file  . Attends Religious Services: Not on file  . Active Member of Clubs or Organizations: Not on file  . Attends Archivist Meetings: Not on file  . Marital Status: Not on file  Intimate Partner Violence:   . Fear of Current or Ex-Partner: Not on file  . Emotionally Abused: Not on file  . Physically Abused: Not on file  . Sexually Abused: Not on file     No Known Allergies   There is no immunization history for the selected administration types on file for this patient.  Outpatient Medications Prior to Visit  Medication Sig Dispense Refill  . albuterol (PROVENTIL) (2.5 MG/3ML) 0.083% nebulizer solution Take 3 mLs (2.5 mg total) by nebulization every 6 (six) hours as needed for wheezing or shortness of breath. 150 mL 11  . albuterol (VENTOLIN HFA) 108 (90 Base) MCG/ACT inhaler Inhale 1-2 puffs into the lungs every 6 (six) hours as needed for wheezing or shortness of breath (cough). 8.5 g 0  . amLODipine (NORVASC) 10 MG tablet Take 1 tablet (10 mg total) by mouth daily. 90 tablet 1  . aspirin EC 81 MG tablet Take 81 mg by mouth daily.    . carvedilol (COREG) 3.125 MG tablet Take 1 tablet (3.125 mg total) by mouth 2 (two) times daily with a meal.  60 tablet 1  . cetirizine (ZYRTEC ALLERGY) 10 MG tablet Take 1 tablet (10 mg total) by mouth daily. 30 tablet 11  . DULoxetine (CYMBALTA) 30 MG capsule Take 1 capsule (30 mg total) by mouth daily. 90 capsule 0  . ferrous sulfate 325 (65 FE) MG tablet Take 1 tablet (325 mg total) by mouth daily with breakfast. 30 tablet 2  . fluticasone (FLONASE) 50 MCG/ACT nasal spray Place 2 sprays into both nostrils daily. 16 g 6  . Fluticasone-Salmeterol (ADVAIR DISKUS) 500-50 MCG/DOSE AEPB Inhale 1  puff into the lungs in the morning and at bedtime. 60 each 11  . hydrochlorothiazide (HYDRODIURIL) 25 MG tablet Take 1 tablet (25 mg total) by mouth daily. 90 tablet 2  . lisinopril (ZESTRIL) 40 MG tablet TAKE 1 TABLET (40 MG TOTAL) BY MOUTH DAILY. 30 tablet 2  . loratadine (CLARITIN) 10 MG tablet Take 1 tablet (10 mg total) by mouth daily. 30 tablet 6  . Masks MISC Nebulizer face mask J45.909 1 each 0  . montelukast (SINGULAIR) 10 MG tablet Take 1 tablet (10 mg total) by mouth at bedtime. 30 tablet 11  . omeprazole (PRILOSEC) 20 MG capsule Take 1 capsule (20 mg total) by mouth daily. 30 capsule 4  . OXYGEN Inhale into the lungs. 3 liters     No facility-administered medications prior to visit.    Review of Systems  Constitutional: Negative for chills, fever and weight loss.  HENT: Positive for congestion.   Respiratory: Positive for cough and shortness of breath.   Cardiovascular: Negative for leg swelling.  Gastrointestinal: Positive for heartburn. Negative for blood in stool, nausea and vomiting.  Genitourinary: Negative for hematuria.  Musculoskeletal: Positive for joint pain.  Skin: Negative for rash.  Neurological: Positive for headaches.  Endo/Heme/Allergies: Positive for environmental allergies.     Objective:   Vitals:   09/29/19 1216 09/29/19 1219  BP: (!) 140/100   Pulse: (!) 113 83  Temp: (!) 97.3 F (36.3 C)   TempSrc: Temporal   SpO2: (!) 83% 98%  Weight: 254 lb 9.6 oz (115.5 kg)    Height: _0  (1.626 m)    98% on 3 LPM    (83% on RA) BMI Readings from Last 3 Encounters:  09/29/19 43.70 kg/m  09/07/19 43.60 kg/m  07/07/19 43.39 kg/m   Wt Readings from Last 3 Encounters:  09/29/19 254 lb 9.6 oz (115.5 kg)  09/07/19 254 lb (115.2 kg)  07/07/19 252 lb 12.8 oz (114.7 kg)    Physical Exam Vitals reviewed.  Constitutional:      Appearance: She is obese.     Comments: Chronically ill-appearing, alert and very interactive.  Appears improved compared to previous exams.  HENT:     Head: Normocephalic and atraumatic.  Eyes:     General: No scleral icterus. Cardiovascular:     Rate and Rhythm: Normal rate and regular rhythm.     Heart sounds: No murmur heard.   Pulmonary:     Comments: Breathing comfortably on supplemental oxygen, no wheezing or rales.  Speaking in full sentences. Abdominal:     General: There is no distension.     Palpations: Abdomen is soft.     Tenderness: There is no abdominal tenderness.  Musculoskeletal:        General: No swelling or deformity.     Cervical back: Neck supple.  Lymphadenopathy:     Cervical: No cervical adenopathy.  Skin:    General: Skin is warm and dry.     Findings: No rash.  Neurological:     General: No focal deficit present.     Mental Status: She is alert.     Coordination: Coordination normal.  Psychiatric:        Mood and Affect: Mood normal.        Behavior: Behavior normal.      CBC    Component Value Date/Time   WBC 9.9 07/03/2019 1647   RBC 4.77 07/03/2019 1647   HGB 11.6 (L) 07/03/2019 1647   HGB 10.3 (L) 06/03/2019  1441   HCT 37.3 07/03/2019 1647   HCT 34.3 06/03/2019 1441   PLT 366 07/03/2019 1647   PLT 404 06/03/2019 1441   MCV 78.2 (L) 07/03/2019 1647   MCV 75 (L) 06/03/2019 1441   MCH 24.3 (L) 07/03/2019 1647   MCHC 31.1 (L) 07/03/2019 1647   RDW 21.9 (H) 07/03/2019 1647   RDW 23.2 (H) 06/03/2019 1441   LYMPHSABS 2,930 07/03/2019 1647   LYMPHSABS 2.3 06/03/2019 1441    MONOABS 0.4 05/22/2019 0702   EOSABS 168 07/03/2019 1647   EOSABS 0.3 06/03/2019 1441   BASOSABS 30 07/03/2019 1647   BASOSABS 0.0 06/03/2019 1441    CHEMISTRY No results for input(s): NA, K, CL, CO2, GLUCOSE, BUN, CREATININE, CALCIUM, MG, PHOS in the last 168 hours. CrCl cannot be calculated (Patient's most recent lab result is older than the maximum 21 days allowed.).   HIV negative 04/2019 A1AT WNL  CRP 1.3 (WNL) on 07/07/19 ESR 40 on 07/07/19  Chest Imaging- films reviewed: CT scan reviewed- Paraseptal and centrilobular emphysema with blebs. Possibly increased GGO throughout remaining lung tissue. No PE.  Pulmonary Functions Testing Results: PFT Results Latest Ref Rng & Units 07/03/2019  FVC-Pre L 1.89  FVC-Predicted Pre % 60  FVC-Post L 2.41  FVC-Predicted Post % 76  Pre FEV1/FVC % % 47  Post FEV1/FCV % % 50  FEV1-Pre L 0.89  FEV1-Predicted Pre % 34  FEV1-Post L 1.20  DLCO uncorrected ml/min/mmHg 17.25  DLCO UNC% % 76  DLCO corrected ml/min/mmHg 19.39  DLCO COR %Predicted % 86  DLVA Predicted % 116  TLC L 4.65  TLC % Predicted % 88  RV % Predicted % 140   2021- severe obstruction with significant bronchodilator reversibility.  Air trapping without hyperinflation or restriction.  Mild diffusion impairment.  Flow volume loop supports mixed obstruction and restriction.  Echocardiogram 05/18/19: LVEF 60-65%, mild LVH, indeterminate diastolic function. Mildly dilated LA. Normal RV function. Trivial AI. Ascending aortic aneurysm.      Assessment & Plan:     ICD-10-CM   1. PULMONARY SARCOIDOSIS  D86.9 Ambulatory referral to Ophthalmology  2. Severe persistent asthma without complication  W96.04       Chronic hypoxic respiratory failure- likely multifactorial- sarcoid, COPD, ashtma -Okay to decrease supplemental oxygen to 1 L/min -Continue high dose Advair. -Follow-up CT scan to assess for control of sarcoidosis.  It is unclear if this is contributing to her symptoms or  is more quiescent.  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 Strongly recommend Covid vaccination and continuing precautions.  Needs pneumonia vaccines as well.  Allergic rhinitis -Continue antihistamine -Continue montelukast  GERD -con't PPI  Not discussed today: Iron deficiency anemia; ongoing microcytic anemia although improved -refilled iron for 3 more months -follow up with PCP  Former tobacco abuse -can refer to lung cancer screening at age 49 -congratulated her on success with quitting and encouraged ongoing cessation.  RTC in 3 months.     Current Outpatient Medications:  .  albuterol (PROVENTIL) (2.5 MG/3ML) 0.083% nebulizer solution, Take 3 mLs (2.5 mg total) by nebulization every 6 (six) hours as needed for wheezing or shortness of breath., Disp: 150 mL, Rfl: 11 .  albuterol (VENTOLIN HFA) 108 (90 Base) MCG/ACT inhaler, Inhale 1-2 puffs into the lungs every 6 (six) hours as needed for wheezing or shortness of  breath (cough)., Disp: 8.5 g, Rfl: 0 .  amLODipine (NORVASC) 10 MG tablet, Take 1 tablet (10 mg total) by mouth daily., Disp: 90 tablet, Rfl: 1 .  aspirin EC 81 MG tablet, Take 81 mg by mouth daily., Disp: , Rfl:  .  carvedilol (COREG) 3.125 MG tablet, Take 1 tablet (3.125 mg total) by mouth 2 (two) times daily with a meal., Disp: 60 tablet, Rfl: 1 .  cetirizine (ZYRTEC ALLERGY) 10 MG tablet, Take 1 tablet (10 mg total) by mouth daily., Disp: 30 tablet, Rfl: 11 .  DULoxetine (CYMBALTA) 30 MG capsule, Take 1 capsule (30 mg total) by mouth daily., Disp: 90 capsule, Rfl: 0 .  ferrous sulfate 325 (65 FE) MG tablet, Take 1 tablet (325 mg total) by mouth daily with breakfast., Disp: 30 tablet, Rfl: 2 .  fluticasone (FLONASE) 50 MCG/ACT nasal spray, Place 2  sprays into both nostrils daily., Disp: 16 g, Rfl: 6 .  Fluticasone-Salmeterol (ADVAIR DISKUS) 500-50 MCG/DOSE AEPB, Inhale 1 puff into the lungs in the morning and at bedtime., Disp: 60 each, Rfl: 11 .  hydrochlorothiazide (HYDRODIURIL) 25 MG tablet, Take 1 tablet (25 mg total) by mouth daily., Disp: 90 tablet, Rfl: 2 .  lisinopril (ZESTRIL) 40 MG tablet, TAKE 1 TABLET (40 MG TOTAL) BY MOUTH DAILY., Disp: 30 tablet, Rfl: 2 .  loratadine (CLARITIN) 10 MG tablet, Take 1 tablet (10 mg total) by mouth daily., Disp: 30 tablet, Rfl: 6 .  Masks MISC, Nebulizer face mask J45.909, Disp: 1 each, Rfl: 0 .  montelukast (SINGULAIR) 10 MG tablet, Take 1 tablet (10 mg total) by mouth at bedtime., Disp: 30 tablet, Rfl: 11 .  omeprazole (PRILOSEC) 20 MG capsule, Take 1 capsule (20 mg total) by mouth daily., Disp: 30 capsule, Rfl: 4 .  OXYGEN, Inhale into the lungs. 3 liters, Disp: , Rfl:      [name], DO [facility] 09/29/2019 12:39 PM

## 2019-10-02 NOTE — Telephone Encounter (Signed)
[facility] has not received the fax.

## 2019-10-07 DIAGNOSIS — R32 Unspecified urinary incontinence: Secondary | ICD-10-CM | POA: Diagnosis not present

## 2019-10-07 DIAGNOSIS — J849 Interstitial pulmonary disease, unspecified: Secondary | ICD-10-CM | POA: Diagnosis not present

## 2019-10-07 DIAGNOSIS — D869 Sarcoidosis, unspecified: Secondary | ICD-10-CM | POA: Diagnosis not present

## 2019-10-07 DIAGNOSIS — J439 Emphysema, unspecified: Secondary | ICD-10-CM | POA: Diagnosis not present

## 2019-10-09 ENCOUNTER — Other Ambulatory Visit: Payer: Self-pay | Admitting: Family Medicine

## 2019-10-09 ENCOUNTER — Ambulatory Visit
Admission: RE | Admit: 2019-10-09 | Discharge: 2019-10-09 | Disposition: A | Discharge status: Home / Self Care | Payer: Medicaid Other | Source: Ambulatory Visit | Attending: Pulmonary Disease | Admitting: Pulmonary Disease

## 2019-10-09 DIAGNOSIS — R918 Other nonspecific abnormal finding of lung field: Secondary | ICD-10-CM

## 2019-10-09 DIAGNOSIS — J849 Interstitial pulmonary disease, unspecified: Secondary | ICD-10-CM | POA: Diagnosis not present

## 2019-10-09 DIAGNOSIS — N6312 Unspecified lump in the right breast, upper inner quadrant: Secondary | ICD-10-CM | POA: Diagnosis not present

## 2019-10-09 DIAGNOSIS — J984 Other disorders of lung: Secondary | ICD-10-CM | POA: Diagnosis not present

## 2019-10-09 DIAGNOSIS — J439 Emphysema, unspecified: Secondary | ICD-10-CM | POA: Diagnosis not present

## 2019-10-09 DIAGNOSIS — I10 Essential (primary) hypertension: Secondary | ICD-10-CM

## 2019-10-09 MED FILL — MONTELUKAST SOD 10 MG TAB: 10 | 30 days supply | Qty: 30 | Fill #1

## 2019-10-09 MED FILL — LISINOPRIL 40 MG TABLET: 40 | 60 days supply | Qty: 60 | Fill #0

## 2019-10-09 MED FILL — CARVEDILOL 3.125 MG TABLET: 3.125 | 30 days supply | Qty: 60 | Fill #1

## 2019-10-09 MED FILL — CETIRIZINE HCL 10 MG TABS: 10 | 30 days supply | Qty: 30 | Fill #1

## 2019-10-14 ENCOUNTER — Telehealth: Payer: Self-pay

## 2019-10-14 DIAGNOSIS — N63 Unspecified lump in unspecified breast: Secondary | ICD-10-CM

## 2019-10-14 NOTE — Telephone Encounter (Signed)
CT Chest Wo Contrast (Accession [id number]) (Order [id number]) Imaging Date: 10/09/2019 Department: [facility] Released By: Noralyn Pick Authorizing: Lauraine Rinne, NP  Exam Status  Status  Final [99]  PACS Intelerad Image Link  Show images for CT Chest Wo Contrast Study Result  Narrative & Impression  CLINICAL DATA:  Interstitial lung disease, shortness of breath on exertion, on oxygen therapy.  EXAM: CT CHEST WITHOUT CONTRAST  TECHNIQUE: Multidetector CT imaging of the chest was performed following the standard protocol without IV contrast.  COMPARISON:  05/17/2019 and 06/10/2007.  FINDINGS: Cardiovascular: Heart size normal.  No pericardial effusion.  Mediastinum/Nodes: No pathologically enlarged mediastinal or axillary lymph nodes. Hilar regions are difficult to definitively evaluate without IV contrast. Esophagus is grossly unremarkable.  Lungs/Pleura: Bullous emphysema. Scattered scarring, volume loss and parenchymal retraction, predominating in the perihilar regions bilaterally, similar to 06/10/2007 but slightly more organized. No pleural fluid. Airway is unremarkable.  Upper Abdomen: Visualized portions of the liver, adrenal glands, kidneys, spleen, pancreas, stomach and bowel are grossly unremarkable. Periportal lymph nodes measure up to 11 mm.  Musculoskeletal: Degenerative changes in the spine. Sclerotic lesions in the spine are similar to 06/10/2007. No worrisome lytic or sclerotic lesions. In the upper inner quadrant of the right breast is a 1.4 x 1.4 cm soft tissue nodule (2/56).  IMPRESSION: 1. Interval clearing of previously seen bilateral patchy pulmonary parenchymal ground-glass, indicative of an infectious/inflammatory etiology, including due to COVID-19. 2. Scattered pulmonary parenchymal scarring, similar, but slightly more organized, than on 06/10/2007. 3.  Emphysema (ICD10-J43.9). 4. Right breast  nodule. Physical exam and ultrasound are suggested, as clinically indicated. These results will be called to the ordering clinician or representative by the Radiologist Assistant, and communication documented in the PACS or [employer].   Electronically Signed   By: Lorin Picket M.D.   On: 10/14/2019 08:18   Result History  CT Chest Wo Contrast (Order #[id number]) on 10/14/2019 - Order Result History Report Encounter-Level Documents - 10/09/2019:  Electronic signature on 10/09/2019 12:00 PM - E-signed Scan on 10/09/2019 12:38 PM by Default, Provider, MD     Order-Level Documents:  There are no order-level documents. Vitals  Height Weight BMI (Calculated)  5\' 4"  (1.626 m) 254 lb 9.6 oz (115.5 kg) 43.68  Protocol Documents  Imaging Protocol  Imaging  Imaging Information  CT Chest Wo Contrast: Patient Communication  10/14/2019 9:20 AM Release Now Not seen  Resulted by:  Signed Date/Time  Phone Pager  [place], [place] 10/14/2019 8:18 AM [phone]  Link to IR Documentation Timeline  Sedation  Study Notes   Jinny Sanders on 10/09/2019 12:37 PM  SOB on exertion - on O2 F/u scan COPD No surgery No ca Prior in PACS Hx HTN  Original Order  Ordered On Ordered By   09/07/2019 1:56 PM Lauraine Rinne, NP       External Result Report  External Result Report   Dr.Clark can you please advise.  Thank you

## 2019-10-14 NOTE — Telephone Encounter (Signed)
Please order a mammogram and breast biopsy for her to be completed ASAP. Please let her know there was something on the scan that the radiologist recommended follow up studies for. Thanks!  LPC

## 2019-10-15 ENCOUNTER — Other Ambulatory Visit: Payer: Self-pay | Admitting: Critical Care Medicine

## 2019-10-15 DIAGNOSIS — N63 Unspecified lump in unspecified breast: Secondary | ICD-10-CM

## 2019-10-15 NOTE — Telephone Encounter (Signed)
Sorry, that should have been breast US, not biopsy. That is my fault.

## 2019-10-15 NOTE — Telephone Encounter (Signed)
Called and spoke with pt letting her know that Dr. Carlis Abbott wants her to have a mammogram performed as soon as possible as well as a biopsy of the breast.  Pt verbalized understanding but was concerned to why this was needing to be done. Pt was fine with [place] placing orders for these procedures but is requesting that Dr. Carlis Abbott call her to further explain.  Dr. Carlis Abbott, please advise.

## 2019-10-15 NOTE — Telephone Encounter (Signed)
Tammy, please see pt's mychart messages and advise.   Pt has seen Aaron Edelman previously so if you believe this can wait until he returns tomorrow, please let us know and we can route it to him. Thanks!

## 2019-10-16 NOTE — Telephone Encounter (Signed)
Spoke with Judeen Hammans, [facility] about the message from Dr. Carlis Abbott as Judeen Hammans was the one who helped schedule pt's procedures.  Judeen Hammans said she was told that they did get pt scheduled for the mammogram and the [place], not a biopsy as they WOULD NOT do a biopsy without having an US performed first. She said that they would need to have the [place] first to see if a biopsy was even needing to be done based off of the results.  Routing this info back to Dr. Carlis Abbott.

## 2019-10-16 NOTE — Telephone Encounter (Signed)
That [place] order is for a biopsy I believe (probably an US-guided breast biopsy). I would call the radiology [place] department to be sure, but I think it will be a different order. Sorry I created extra work for you!  LPC

## 2019-10-16 NOTE — Telephone Encounter (Signed)
Thanks

## 2019-10-16 NOTE — Telephone Encounter (Signed)
Pt has been scheduled for a mammogram as well as US breast limited 10/27/19. Dr. Carlis Abbott, please advise if the procedure for the US breast limited was the correct procedure that was to be scheduled as I think that procedure that was scheduled might have been based off of when you had said mammogram and breast biopsy.  Please let us know if anything needs to be changed with her upcoming appts as we do not want her to have to have something done if it was not the right order.

## 2019-10-16 NOTE — Telephone Encounter (Signed)
This can be routed to Dr. Carlis Abbott who is currently in the office.  Wyn Quaker, FNP

## 2019-10-19 ENCOUNTER — Other Ambulatory Visit: Payer: Self-pay | Admitting: Nurse Practitioner

## 2019-10-19 DIAGNOSIS — R7989 Other specified abnormal findings of blood chemistry: Secondary | ICD-10-CM

## 2019-10-19 DIAGNOSIS — I10 Essential (primary) hypertension: Secondary | ICD-10-CM

## 2019-10-19 DIAGNOSIS — R110 Nausea: Secondary | ICD-10-CM

## 2019-10-19 DIAGNOSIS — D649 Anemia, unspecified: Secondary | ICD-10-CM

## 2019-10-19 NOTE — Progress Notes (Signed)
Called and spoke with patient. Discussed reason for mammogram due to right breast nodule noted on imaging. She is currently experiencing intermittent nausea. Will order labs for this. She will come in this week for blood work. She is also inquiring about disability. I will have the case manager reach out to her regarding this. She has no other questions or concerns at this time.

## 2019-10-27 ENCOUNTER — Other Ambulatory Visit: Payer: Self-pay | Admitting: Critical Care Medicine

## 2019-10-27 ENCOUNTER — Ambulatory Visit
Admission: RE | Admit: 2019-10-27 | Discharge: 2019-10-27 | Disposition: A | Discharge status: Home / Self Care | Payer: Medicaid Other | Source: Ambulatory Visit | Attending: Critical Care Medicine | Admitting: Critical Care Medicine

## 2019-10-27 ENCOUNTER — Other Ambulatory Visit: Payer: Self-pay

## 2019-10-27 ENCOUNTER — Other Ambulatory Visit: Payer: Medicaid Other

## 2019-10-27 DIAGNOSIS — R922 Inconclusive mammogram: Secondary | ICD-10-CM | POA: Diagnosis not present

## 2019-10-27 DIAGNOSIS — N63 Unspecified lump in unspecified breast: Secondary | ICD-10-CM

## 2019-10-27 DIAGNOSIS — N6312 Unspecified lump in the right breast, upper inner quadrant: Secondary | ICD-10-CM | POA: Diagnosis not present

## 2019-10-28 ENCOUNTER — Telehealth: Payer: Self-pay | Admitting: Critical Care Medicine

## 2019-10-28 NOTE — Telephone Encounter (Signed)
I called Teresa Mendez to follow up on her breast US results. She has a biopsy scheduled for 10/8. She understands that this could be breast cancer or a benign growth.  [name], DO 10/28/19 11:24 AM [facility]

## 2019-10-30 DIAGNOSIS — R32 Unspecified urinary incontinence: Secondary | ICD-10-CM | POA: Diagnosis not present

## 2019-11-06 ENCOUNTER — Ambulatory Visit
Admission: RE | Admit: 2019-11-06 | Discharge: 2019-11-06 | Disposition: A | Discharge status: Home / Self Care | Payer: Medicaid Other | Source: Ambulatory Visit | Attending: Critical Care Medicine | Admitting: Critical Care Medicine

## 2019-11-06 ENCOUNTER — Other Ambulatory Visit: Payer: Self-pay

## 2019-11-06 ENCOUNTER — Other Ambulatory Visit: Payer: Self-pay | Admitting: Nurse Practitioner

## 2019-11-06 ENCOUNTER — Ambulatory Visit: Payer: Medicaid Other | Attending: Nurse Practitioner | Admitting: Nurse Practitioner

## 2019-11-06 ENCOUNTER — Encounter: Payer: Self-pay | Admitting: Nurse Practitioner

## 2019-11-06 VITALS — BP 151/98 | HR 77 | Temp 97.7°F | Ht 64.0 in | Wt 254.0 lb

## 2019-11-06 DIAGNOSIS — N63 Unspecified lump in unspecified breast: Secondary | ICD-10-CM

## 2019-11-06 DIAGNOSIS — Z1159 Encounter for screening for other viral diseases: Secondary | ICD-10-CM | POA: Diagnosis not present

## 2019-11-06 DIAGNOSIS — Z9981 Dependence on supplemental oxygen: Secondary | ICD-10-CM

## 2019-11-06 DIAGNOSIS — R110 Nausea: Secondary | ICD-10-CM

## 2019-11-06 DIAGNOSIS — R7309 Other abnormal glucose: Secondary | ICD-10-CM

## 2019-11-06 DIAGNOSIS — I10 Essential (primary) hypertension: Secondary | ICD-10-CM | POA: Diagnosis not present

## 2019-11-06 DIAGNOSIS — N6312 Unspecified lump in the right breast, upper inner quadrant: Secondary | ICD-10-CM | POA: Diagnosis not present

## 2019-11-06 DIAGNOSIS — E876 Hypokalemia: Secondary | ICD-10-CM | POA: Diagnosis not present

## 2019-11-06 DIAGNOSIS — R7989 Other specified abnormal findings of blood chemistry: Secondary | ICD-10-CM | POA: Diagnosis not present

## 2019-11-06 DIAGNOSIS — M2550 Pain in unspecified joint: Secondary | ICD-10-CM | POA: Diagnosis not present

## 2019-11-06 DIAGNOSIS — D241 Benign neoplasm of right breast: Secondary | ICD-10-CM | POA: Diagnosis not present

## 2019-11-06 DIAGNOSIS — D649 Anemia, unspecified: Secondary | ICD-10-CM

## 2019-11-06 DIAGNOSIS — J9611 Chronic respiratory failure with hypoxia: Secondary | ICD-10-CM

## 2019-11-06 DIAGNOSIS — D509 Iron deficiency anemia, unspecified: Secondary | ICD-10-CM | POA: Diagnosis not present

## 2019-11-06 DIAGNOSIS — Z131 Encounter for screening for diabetes mellitus: Secondary | ICD-10-CM | POA: Diagnosis not present

## 2019-11-06 DIAGNOSIS — K5909 Other constipation: Secondary | ICD-10-CM

## 2019-11-06 DIAGNOSIS — Z021 Encounter for pre-employment examination: Secondary | ICD-10-CM

## 2019-11-06 DIAGNOSIS — K219 Gastro-esophageal reflux disease without esophagitis: Secondary | ICD-10-CM | POA: Diagnosis not present

## 2019-11-06 MED ORDER — AMLODIPINE BESYLATE 10 MG PO TABS: 10.0000 mg | ORAL_TABLET | Freq: Every day | ORAL | 1 refills | Status: DC

## 2019-11-06 MED ORDER — CARVEDILOL 3.125 MG PO TABS: 3.1250 mg | ORAL_TABLET | Freq: Two times a day (BID) | ORAL | 1 refills | Status: DC

## 2019-11-06 MED ORDER — HYDROCHLOROTHIAZIDE 25 MG PO TABS: 25.0000 mg | ORAL_TABLET | Freq: Every day | ORAL | 2 refills | Status: DC

## 2019-11-06 MED ORDER — DULOXETINE HCL 30 MG PO CPEP: 30.0000 mg | ORAL_CAPSULE | Freq: Every day | ORAL | 0 refills | Status: DC

## 2019-11-06 MED ORDER — FERROUS SULFATE 325 (65 FE) MG PO TABS: 325.0000 mg | ORAL_TABLET | Freq: Every day | ORAL | 2 refills | Status: DC

## 2019-11-06 MED ORDER — DOCUSATE SODIUM 100 MG PO CAPS: 100.0000 mg | ORAL_CAPSULE | Freq: Two times a day (BID) | ORAL | 2 refills | Status: AC

## 2019-11-06 MED ORDER — OMEPRAZOLE 20 MG PO CPDR: 20.0000 mg | DELAYED_RELEASE_CAPSULE | Freq: Every day | ORAL | 1 refills | Status: DC

## 2019-11-06 MED ORDER — FLUTICASONE-SALMETEROL 500-50 MCG/ACT IN AEPB: 1.0000 | INHALATION_SPRAY | Freq: Two times a day (BID) | RESPIRATORY_TRACT | 9 refills | Status: DC

## 2019-11-06 MED FILL — HYDROCHLOROTHIAZIDE 25 MG T: 25 | 90 days supply | Qty: 90 | Fill #0

## 2019-11-06 MED FILL — DULoxetine HCL 30 MG CPEP: 30 | 90 days supply | Qty: 90 | Fill #0

## 2019-11-06 MED FILL — CARVEDILOL 3.125 MG TABLET: 3.125 | 90 days supply | Qty: 180 | Fill #0

## 2019-11-06 MED FILL — AMLODIPINE BESYLATE 10 MG T: 10 | 90 days supply | Qty: 90 | Fill #0

## 2019-11-06 MED FILL — ADVAIR 500/50 DISKUS: 500-50 | 30 days supply | Qty: 60 | Fill #0

## 2019-11-06 MED FILL — OMEPRAZOLE 20 MG CAP: 20 | 90 days supply | Qty: 90 | Fill #0

## 2019-11-06 NOTE — Progress Notes (Signed)
Assessment & Plan:  Teresa Mendez was seen today for hypertension.  Diagnoses and all orders for this visit:  Essential hypertension -     amLODipine (NORVASC) 10 MG tablet; Take 1 tablet (10 mg total) by mouth daily. -     carvedilol (COREG) 3.125 MG tablet; Take 1 tablet (3.125 mg total) by mouth 2 (two) times daily with a meal. -     hydrochlorothiazide (HYDRODIURIL) 25 MG tablet; Take 1 tablet (25 mg total) by mouth daily. -     CMP14+EGFR Continue all antihypertensives as prescribed.  Remember to bring in your blood pressure log with you for your follow up appointment.  DASH/Mediterranean Diets are healthier choices for HTN.    Chronic respiratory failure with hypoxia, on home oxygen therapy (HCC) -     Fluticasone-Salmeterol (ADVAIR DISKUS) 500-50 MCG/DOSE AEPB; Inhale 1 puff into the lungs in the morning and at bedtime.  Gastroesophageal reflux disease, unspecified whether esophagitis present -     omeprazole (PRILOSEC) 20 MG capsule; Take 1 capsule (20 mg total) by mouth daily. INSTRUCTIONS: Avoid GERD Triggers: acidic, spicy or fried foods, caffeine, coffee, sodas,  alcohol and chocolate.   Iron deficiency anemia, unspecified iron deficiency anemia type -     ferrous sulfate 325 (65 FE) MG tablet; Take 1 tablet (325 mg total) by mouth daily with breakfast. -     CBC  Elevated glucose -     Hemoglobin A1c; Future -     Hemoglobin A1c  Elevated LFTs -     Lipid panel  Nausea -     CMP14+EGFR  Need for hepatitis C screening test -     Hepatitis C Antibody  Chronic constipation -     docusate sodium (COLACE) 100 MG capsule; Take 1 capsule (100 mg total) by mouth 2 (two) times daily.  Arthralgia of multiple joints -     DULoxetine (CYMBALTA) 30 MG capsule; Take 1 capsule (30 mg total) by mouth daily. Work on losing weight to help reduce joint pain. May alternate with heat and ice application for pain relief. May also alternate with acetaminophen and Ibuprofen as prescribed  pain relief. Other alternatives include massage, acupuncture and water aerobics.  You must stay active and avoid a sedentary lifestyle.  Patient has been counseled on age-appropriate routine health concerns for screening and prevention. These are reviewed and up-to-date. Referrals have been placed accordingly. Immunizations are up-to-date or declined.    Subjective:   Chief Complaint  Patient presents with  . Hypertension    Pt. is here for hypertension follow up.    HPI Teresa Mendez 44 y.o. female presents to office today for HTN.  has a past medical history of Asthma, Hypertension, Sarcoidosis, and Sickle cell trait ([place]).  She sees pulmonology for Sarcoidosis. She is on chronic O2. Has upcoming appointment with retinal specialist to determine if current visual deficits are related to sarcoidosis.   Would like to speak to our case manager or SW regarding how to apply for disability.    Essential Hypertension She endorses medication compliance. Poorly controlled. She endorses medication compliance taking HCTZ 25 mg daily, amlodipine 10 mg daily, lisinopril 40 mg daily. Denies chest pain,  palpitations, lightheadedness, dizziness, headaches or BLE edema. Will add low dose carvedilol 3.125 mg BID. It does not appear she has been taking this for a several weeks based on med review.  BP Readings from Last 3 Encounters:  11/06/19 (!) 151/98  09/29/19 (!) 140/100  09/07/19 130/70  Joint pain She has chronic generalized joint pain. I have prescribed her cymbalta for this.    Review of Systems  Constitutional: Negative for fever, malaise/fatigue and weight loss.  HENT: Negative.  Negative for nosebleeds.   Eyes: Negative.  Negative for blurred vision, double vision and photophobia.  Respiratory: Positive for shortness of breath (chronic). Negative for cough.   Cardiovascular: Negative.  Negative for chest pain, palpitations and leg swelling.  Gastrointestinal: Positive for  constipation and heartburn. Negative for nausea and vomiting.  Musculoskeletal: Positive for joint pain. Negative for myalgias.  Neurological: Negative.  Negative for dizziness, focal weakness, seizures and headaches.  Psychiatric/Behavioral: Negative.  Negative for suicidal ideas.    Past Medical History:  Diagnosis Date  . Asthma   . Hypertension   . Sarcoidosis   . Sickle cell trait [facility])     Past Surgical History:  Procedure Laterality Date  . TUBAL LIGATION      Family History  Problem Relation Age of Onset  . Hypertension Mother   . Congestive Heart Failure Mother   . Kidney failure Mother   . Sickle cell trait Mother   . Hypertension Father   . Congestive Heart Failure Father   . Kidney failure Father   . Glaucoma Father     Social History Reviewed with no changes to be made today.   Outpatient Medications Prior to Visit  Medication Sig Dispense Refill  . albuterol (PROVENTIL) (2.5 MG/3ML) 0.083% nebulizer solution Take 3 mLs (2.5 mg total) by nebulization every 6 (six) hours as needed for wheezing or shortness of breath. 150 mL 11  . albuterol (VENTOLIN HFA) 108 (90 Base) MCG/ACT inhaler Inhale 1-2 puffs into the lungs every 6 (six) hours as needed for wheezing or shortness of breath (cough). 8.5 g 0  . aspirin EC 81 MG tablet Take 81 mg by mouth daily.    . cetirizine (ZYRTEC ALLERGY) 10 MG tablet Take 1 tablet (10 mg total) by mouth daily. 30 tablet 11  . fluticasone (FLONASE) 50 MCG/ACT nasal spray Place 2 sprays into both nostrils daily. 16 g 6  . lisinopril (ZESTRIL) 40 MG tablet TAKE 1 TABLET (40 MG TOTAL) BY MOUTH DAILY. 60 tablet 2  . loratadine (CLARITIN) 10 MG tablet Take 1 tablet (10 mg total) by mouth daily. 30 tablet 6  . Masks MISC Nebulizer face mask [id number] 1 each 0  . montelukast (SINGULAIR) 10 MG tablet Take 1 tablet (10 mg total) by mouth at bedtime. 30 tablet 11  . OXYGEN Inhale into the lungs. 3 liters    . carvedilol (COREG) 3.125 MG tablet  Take 1 tablet (3.125 mg total) by mouth 2 (two) times daily with a meal. 60 tablet 1  . Fluticasone-Salmeterol (ADVAIR DISKUS) 500-50 MCG/DOSE AEPB Inhale 1 puff into the lungs in the morning and at bedtime. 60 each 11  . omeprazole (PRILOSEC) 20 MG capsule Take 1 capsule (20 mg total) by mouth daily. 30 capsule 4  . amLODipine (NORVASC) 10 MG tablet Take 1 tablet (10 mg total) by mouth daily. 90 tablet 1  . DULoxetine (CYMBALTA) 30 MG capsule Take 1 capsule (30 mg total) by mouth daily. 90 capsule 0  . ferrous sulfate 325 (65 FE) MG tablet Take 1 tablet (325 mg total) by mouth daily with breakfast. 30 tablet 2  . hydrochlorothiazide (HYDRODIURIL) 25 MG tablet Take 1 tablet (25 mg total) by mouth daily. 90 tablet 2   No facility-administered medications prior to visit.  No Known Allergies     Objective:    BP (!) 151/98 (BP Location: Left Arm, Patient Position: Sitting, Cuff Size: Large)   Pulse 77   Temp 97.7 F (36.5 C) (Temporal)   Ht 5' 4"  (1.626 m)   Wt 254 lb (115.2 kg)   LMP 11/01/2019   SpO2 99%   BMI 43.60 kg/m  Wt Readings from Last 3 Encounters:  11/06/19 254 lb (115.2 kg)  09/29/19 254 lb 9.6 oz (115.5 kg)  09/07/19 254 lb (115.2 kg)    Physical Exam       Patient has been counseled extensively about nutrition and exercise as well as the importance of adherence with medications and regular follow-up. The patient was given clear instructions to go to ER or return to medical center if symptoms don't improve, worsen or new problems develop. The patient verbalized understanding.   Follow-up: Return in about 3 months (around 02/06/2020).   [name], FNP-BC [facility] [place], [place]   11/07/2019, 8:54 PM

## 2019-11-07 ENCOUNTER — Encounter: Payer: Self-pay | Admitting: Nurse Practitioner

## 2019-11-07 ENCOUNTER — Other Ambulatory Visit: Payer: Self-pay | Admitting: Nurse Practitioner

## 2019-11-07 LAB — LIPID PANEL
Chol/HDL Ratio: 4.3 ratio (ref 0.0–4.4)
Cholesterol, Total: 201 mg/dL — ABNORMAL HIGH (ref 100–199)
HDL: 47 mg/dL (ref >39)
LDL Chol Calc (NIH): 134 mg/dL — ABNORMAL HIGH (ref 0–99)
Triglycerides: 113 mg/dL (ref 0–149)
VLDL Cholesterol Cal: 20 mg/dL (ref 5–40)

## 2019-11-07 LAB — CMP14+EGFR
ALT: 12 IU/L (ref 0–32)
AST: 14 IU/L (ref 0–40)
Albumin/Globulin Ratio: 1.2 (ref 1.2–2.2)
Albumin: 3.9 g/dL (ref 3.8–4.8)
Alkaline Phosphatase: 77 IU/L (ref 44–121)
BUN/Creatinine Ratio: 14 (ref 9–23)
BUN: 12 mg/dL (ref 6–24)
Bilirubin Total: 0.2 mg/dL (ref 0.0–1.2)
CO2: 32 mmol/L — ABNORMAL HIGH (ref 20–29)
Calcium: 9.3 mg/dL (ref 8.7–10.2)
Chloride: 98 mmol/L (ref 96–106)
Creatinine, Ser: 0.84 mg/dL (ref 0.57–1.00)
GFR calc Af Amer: 96 mL/min/{1.73_m2} (ref >59)
GFR calc non Af Amer: 84 mL/min/{1.73_m2} (ref >59)
Globulin, Total: 3.2 g/dL (ref 1.5–4.5)
Glucose: 87 mg/dL (ref 65–99)
Potassium: 3.3 mmol/L — ABNORMAL LOW (ref 3.5–5.2)
Sodium: 141 mmol/L (ref 134–144)
Total Protein: 7.1 g/dL (ref 6.0–8.5)

## 2019-11-07 LAB — HEPATITIS C ANTIBODY: Hep C Virus Ab: 0.1 s/co ratio (ref 0.0–0.9)

## 2019-11-07 LAB — HEMOGLOBIN A1C
Est. average glucose Bld gHb Est-mCnc: 103 mg/dL
Hgb A1c MFr Bld: 5.2 % (ref 4.8–5.6)

## 2019-11-07 LAB — CBC
Hematocrit: 32.4 % — ABNORMAL LOW (ref 34.0–46.6)
Hemoglobin: 10.4 g/dL — ABNORMAL LOW (ref 11.1–15.9)
MCH: 25.6 pg — ABNORMAL LOW (ref 26.6–33.0)
MCHC: 32.1 g/dL (ref 31.5–35.7)
MCV: 80 fL (ref 79–97)
Platelets: 333 10*3/uL (ref 150–450)
RBC: 4.07 x10E6/uL (ref 3.77–5.28)
RDW: 12.9 % (ref 11.7–15.4)
WBC: 10.4 10*3/uL (ref 3.4–10.8)

## 2019-11-07 MED ORDER — ATORVASTATIN CALCIUM 20 MG PO TABS: 20.0000 mg | ORAL_TABLET | Freq: Every day | ORAL | 3 refills | Status: DC

## 2019-11-07 MED ORDER — POTASSIUM CHLORIDE ER 20 MEQ PO TBCR: 20.0000 meq | EXTENDED_RELEASE_TABLET | Freq: Every day | ORAL | 0 refills | Status: DC

## 2019-11-09 MED FILL — ATORVASTATIN CALCIUM 20 MG: 20 | 90 days supply | Qty: 90 | Fill #0

## 2019-11-09 MED FILL — POTASSIUM CL ER 20 MEQ TAB: 20 | 7 days supply | Qty: 7 | Fill #0

## 2019-11-19 MED FILL — ATORVASTATIN CALCIUM 20 MG: 20 | 90 days supply | Qty: 90 | Fill #0

## 2019-11-19 MED FILL — POTASSIUM CL ER 20 MEQ TAB: 20 | 7 days supply | Qty: 7 | Fill #0

## 2019-11-19 MED FILL — DULoxetine HCL 30 MG CPEP: 30 | 90 days supply | Qty: 90 | Fill #0

## 2019-11-19 MED FILL — CARVEDILOL 3.125 MG TABLET: 3.125 | 90 days supply | Qty: 180 | Fill #0

## 2019-11-19 MED FILL — AMLODIPINE BESYLATE 10 MG T: 10 | 90 days supply | Qty: 90 | Fill #0

## 2019-11-19 MED FILL — ADVAIR 500/50 DISKUS: 500-50 | 30 days supply | Qty: 60 | Fill #0

## 2019-11-19 MED FILL — HYDROCHLOROTHIAZIDE 25 MG T: 25 | 90 days supply | Qty: 90 | Fill #0

## 2019-11-19 MED FILL — OMEPRAZOLE 20 MG CAP: 20 | 90 days supply | Qty: 90 | Fill #0

## 2019-11-23 ENCOUNTER — Telehealth: Payer: Self-pay

## 2019-11-23 NOTE — Telephone Encounter (Signed)
Please help pt tif possible! Copied from [place] [id number]. Topic: General - Inquiry >> Nov 17, 2019 10:34 AM Gillis Ends D wrote: Reason for CRM: Patient called to see what can be done for her to get her medication from the pharmacy. She has no money to get it, she is already at the 100.00 mark and they want allow her anymore credit. She is currently not working and on oxygen. She can be reached at [phone]. Please advise

## 2019-11-24 NOTE — Telephone Encounter (Signed)
Patient called. As she has insurance, she can make any payment towards her existing charge account and pick-up her medications. She can continue to pick-up her rxs as long as she makes payments. Pt verbalized understanding.

## 2019-11-25 DIAGNOSIS — H5213 Myopia, bilateral: Secondary | ICD-10-CM | POA: Diagnosis not present

## 2019-11-27 ENCOUNTER — Emergency Department (HOSPITAL_COMMUNITY): Payer: Medicaid Other

## 2019-11-27 ENCOUNTER — Other Ambulatory Visit: Payer: Self-pay | Admitting: Physician Assistant

## 2019-11-27 ENCOUNTER — Other Ambulatory Visit: Payer: Self-pay

## 2019-11-27 ENCOUNTER — Emergency Department (HOSPITAL_COMMUNITY)
Admission: EM | Admit: 2019-11-27 | Discharge: 2019-11-27 | Disposition: A | Discharge status: Home / Self Care | Payer: Medicaid Other | Attending: Emergency Medicine | Admitting: Emergency Medicine

## 2019-11-27 DIAGNOSIS — R0602 Shortness of breath: Secondary | ICD-10-CM | POA: Insufficient documentation

## 2019-11-27 DIAGNOSIS — Z79899 Other long term (current) drug therapy: Secondary | ICD-10-CM | POA: Diagnosis not present

## 2019-11-27 DIAGNOSIS — J449 Chronic obstructive pulmonary disease, unspecified: Secondary | ICD-10-CM | POA: Diagnosis not present

## 2019-11-27 DIAGNOSIS — R109 Unspecified abdominal pain: Secondary | ICD-10-CM | POA: Insufficient documentation

## 2019-11-27 DIAGNOSIS — I10 Essential (primary) hypertension: Secondary | ICD-10-CM | POA: Diagnosis not present

## 2019-11-27 DIAGNOSIS — Z20822 Contact with and (suspected) exposure to covid-19: Secondary | ICD-10-CM | POA: Insufficient documentation

## 2019-11-27 DIAGNOSIS — Z7982 Long term (current) use of aspirin: Secondary | ICD-10-CM | POA: Diagnosis not present

## 2019-11-27 DIAGNOSIS — Z87891 Personal history of nicotine dependence: Secondary | ICD-10-CM | POA: Insufficient documentation

## 2019-11-27 DIAGNOSIS — R062 Wheezing: Secondary | ICD-10-CM | POA: Diagnosis not present

## 2019-11-27 DIAGNOSIS — J439 Emphysema, unspecified: Secondary | ICD-10-CM | POA: Diagnosis not present

## 2019-11-27 DIAGNOSIS — J441 Chronic obstructive pulmonary disease with (acute) exacerbation: Secondary | ICD-10-CM | POA: Diagnosis not present

## 2019-11-27 DIAGNOSIS — Z7951 Long term (current) use of inhaled steroids: Secondary | ICD-10-CM | POA: Insufficient documentation

## 2019-11-27 DIAGNOSIS — K8020 Calculus of gallbladder without cholecystitis without obstruction: Secondary | ICD-10-CM

## 2019-11-27 DIAGNOSIS — R140 Abdominal distension (gaseous): Secondary | ICD-10-CM | POA: Diagnosis not present

## 2019-11-27 LAB — CBC
HCT: 33.6 % — ABNORMAL LOW (ref 36.0–46.0)
Hemoglobin: 10.4 g/dL — ABNORMAL LOW (ref 12.0–15.0)
MCH: 25.5 pg — ABNORMAL LOW (ref 26.0–34.0)
MCHC: 31 g/dL (ref 30.0–36.0)
MCV: 82.4 fL (ref 80.0–100.0)
Platelets: 312 10*3/uL (ref 150–400)
RBC: 4.08 MIL/uL (ref 3.87–5.11)
RDW: 13.1 % (ref 11.5–15.5)
WBC: 11.2 10*3/uL — ABNORMAL HIGH (ref 4.0–10.5)
nRBC: 0 % (ref 0.0–0.2)

## 2019-11-27 LAB — I-STAT BETA HCG BLOOD, ED (MC, WL, AP ONLY): I-stat hCG, quantitative: <5 m[IU]/mL (ref <5)

## 2019-11-27 LAB — TROPONIN I (HIGH SENSITIVITY)
Troponin I (High Sensitivity): 10 ng/L (ref <18)
Troponin I (High Sensitivity): 9 ng/L (ref <18)

## 2019-11-27 LAB — RESPIRATORY PANEL BY RT PCR (FLU A&B, COVID)
Influenza A by PCR: NEGATIVE
Influenza B by PCR: NEGATIVE
SARS Coronavirus 2 by RT PCR: NEGATIVE

## 2019-11-27 LAB — BASIC METABOLIC PANEL
Anion gap: 8 (ref 5–15)
BUN: 6 mg/dL (ref 6–20)
CO2: 32 mmol/L (ref 22–32)
Calcium: 8.9 mg/dL (ref 8.9–10.3)
Chloride: 97 mmol/L — ABNORMAL LOW (ref 98–111)
Creatinine, Ser: 0.82 mg/dL (ref 0.44–1.00)
GFR, Estimated: >60 mL/min (ref >60)
Glucose, Bld: 124 mg/dL — ABNORMAL HIGH (ref 70–99)
Potassium: 3.3 mmol/L — ABNORMAL LOW (ref 3.5–5.1)
Sodium: 137 mmol/L (ref 135–145)

## 2019-11-27 LAB — BRAIN NATRIURETIC PEPTIDE: B Natriuretic Peptide: 136.5 pg/mL — ABNORMAL HIGH (ref 0.0–100.0)

## 2019-11-27 MED ORDER — PREDNISONE 20 MG PO TABS: 40.0000 mg | ORAL_TABLET | Freq: Every day | ORAL | 0 refills | Status: AC

## 2019-11-27 MED ORDER — METHYLPREDNISOLONE SODIUM SUCC 125 MG IJ SOLR
125.0000 mg | Freq: Once | INTRAMUSCULAR | Status: AC
  Administered 2019-11-27: 125 mg via INTRAVENOUS
  Filled 2019-11-27: qty 2

## 2019-11-27 MED ORDER — IPRATROPIUM-ALBUTEROL 0.5-2.5 (3) MG/3ML IN SOLN
3.0000 mL | Freq: Once | RESPIRATORY_TRACT | Status: AC
  Administered 2019-11-27: 3 mL via RESPIRATORY_TRACT
  Filled 2019-11-27: qty 3

## 2019-11-27 MED ORDER — IOHEXOL 300 MG/ML  SOLN
100.0000 mL | Freq: Once | INTRAMUSCULAR | Status: AC | PRN
  Administered 2019-11-27: 100 mL via INTRAVENOUS

## 2019-11-27 MED ORDER — POTASSIUM CHLORIDE CRYS ER 20 MEQ PO TBCR
40.0000 meq | EXTENDED_RELEASE_TABLET | Freq: Once | ORAL | Status: AC
  Administered 2019-11-27: 40 meq via ORAL
  Filled 2019-11-27: qty 2

## 2019-11-27 MED FILL — predniSONE 20 MG TABS: 20 | 5 days supply | Qty: 10 | Fill #0

## 2019-11-27 NOTE — ED Provider Notes (Signed)
Care received from [facility].  Please see his note for full HPI  In short, 44 year old female with complaints of shortness of breath in the setting of COPD, normally on 3 L of home O2. No increasing O2 requirements. She was also complaining of generalized abdominal pain and soreness.  She received albuterol and Solu-Medrol treatment and improved significant improvement in her breathing.  However she continued to complain of abdominal pain.  Care received of the patient pending following up on the CT of the abdomen  I personally reviewed her imaging No acute abdominal findings, though some cholelithiasis was noted.  On exam, patient continues to state that her breathing is better.  Lung sounds clear on exam.  Overall, stable for discharge.  Will send home with short course of steroids.  Return precautions discussed.  She was understanding is agreeable.  At this stage in the ED course, the patient is medically screened and stable for discharge  Physical Exam  BP (!) 154/96   Pulse 84   Temp 97.7 F (36.5 C) (Oral)   Resp (!) 30   Ht 5\' 4"  (1.626 m)   Wt 115 kg   LMP 11/01/2019   SpO2 100%   BMI 43.52 kg/m   Physical Exam Vitals and nursing note reviewed.  Constitutional:      General: She is not in acute distress.    Appearance: She is well-developed.  HENT:     Head: Normocephalic and atraumatic.  Eyes:     Conjunctiva/sclera: Conjunctivae normal.  Cardiovascular:     Rate and Rhythm: Normal rate and regular rhythm.     Heart sounds: No murmur heard.   Pulmonary:     Effort: Pulmonary effort is normal. No respiratory distress.     Breath sounds: Normal breath sounds. No decreased breath sounds, wheezing, rhonchi or rales.  Chest:     Chest wall: No tenderness.  Abdominal:     Palpations: Abdomen is soft.     Tenderness: There is abdominal tenderness. There is no guarding.     Comments: Mild generalized tenderness   Musculoskeletal:     Cervical back: Neck supple.   Skin:    General: Skin is warm and dry.  Neurological:     Mental Status: She is alert.     ED Course/Procedures     Procedures  MDM         Garald Balding, PA-C 11/27/19 5056    Tegeler, Gwenyth Allegra, MD 11/27/19 [phone]

## 2019-11-27 NOTE — Discharge Instructions (Addendum)
Your work-up today was overall reassuring.  Your CT scan did show that you have some gallstones in your gallbladder, however they are not causing any problems at this time.  Please take the prednisone as directed until finished.  Please make sure to follow-up with your primary care doctor.  Return to the ER for any new or worsening symptoms.

## 2019-11-27 NOTE — ED Provider Notes (Signed)
[facility] EMERGENCY DEPARTMENT Provider Note   CSN: [id number] Arrival date & time: 11/27/19  0110     History Chief Complaint  Patient presents with  . Shortness of Breath    Teresa Mendez is a 44 y.o. female.  Patient presents to the emergency department with a chief complaint of shortness of breath.  She has history of COPD.  She wears 3 L home O2 24/7.  She states that her symptoms worsen over the past day or so.  She denies any fevers, chills, or productive cough.  She also reports abdominal discomfort.  She denies any nausea, vomiting, or diarrhea.  Denies any dysuria.  Denies any successful treatments prior to arrival.  Denies any other associated symptoms.  The history is provided by the patient. No language interpreter was used.       Past Medical History:  Diagnosis Date  . Asthma   . Hypertension   . Sarcoidosis   . Sickle cell trait [facility])     Patient Active Problem List   Diagnosis Date Noted  . Gastroesophageal reflux disease 09/07/2019  . Ground glass opacity present on imaging of lung 09/07/2019  . Interstitial pulmonary disease ([place]) 09/07/2019  . Emphysema lung ([place]) 07/03/2019  . Abnormal findings on diagnostic imaging of lung 07/03/2019  . Myalgia 07/03/2019  . Hypertensive urgency 05/18/2019  . Chest pain 05/18/2019  . Community acquired pneumonia   . Acute respiratory failure with hypoxia ([place]) 05/17/2019  . 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 Essential hypertension 06/27/2007  . DYSPNEA 06/27/2007    Past Surgical History:  Procedure Laterality Date  . TUBAL LIGATION       OB History    Gravida  6   Para  3   Term  3   Preterm      AB  3   Living  3     SAB  3   TAB      Ectopic      Multiple      Live Births  3           Family History  Problem Relation Age of Onset  . Hypertension Mother   . Congestive Heart Failure Mother   . Kidney failure Mother   .  Sickle cell trait Mother   . Hypertension Father   . Congestive Heart Failure Father   . Kidney failure Father   . Glaucoma Father     Social History   Tobacco Use  . Smoking status: Former Smoker    Packs/day: 0.50    Types: Cigarettes    Quit date: 01/03/2019    Years since quitting: 0.8  . Smokeless tobacco: Never Used  Vaping Use  . Vaping Use: Former  Substance Use Topics  . Alcohol use: Yes    Comment: Occasionally   . Drug use: No    Home Medications Prior to Admission medications   Medication Sig Start Date End Date Taking? Authorizing Provider  albuterol (PROVENTIL) (2.5 MG/3ML) 0.083% nebulizer solution Take 3 mLs (2.5 mg total) by nebulization every 6 (six) hours as needed for wheezing or shortness of breath. 07/07/19   [name], DO  albuterol (VENTOLIN HFA) 108 (90 Base) MCG/ACT inhaler Inhale 1-2 puffs into the lungs every 6 (six) hours as needed for wheezing or shortness of breath (cough). 05/27/19   [name], NP  amLODipine (NORVASC) 10 MG tablet Take 1 tablet (10 mg total) by  mouth daily. 11/06/19 02/04/20  [name], NP  aspirin EC 81 MG tablet Take 81 mg by mouth daily.    [provider]  atorvastatin (LIPITOR) 20 MG tablet Take 1 tablet (20 mg total) by mouth daily. 11/07/19   [name], NP  carvedilol (COREG) 3.125 MG tablet Take 1 tablet (3.125 mg total) by mouth 2 (two) times daily with a meal. 11/06/19 02/04/20  [name], NP  cetirizine (ZYRTEC ALLERGY) 10 MG tablet Take 1 tablet (10 mg total) by mouth daily. 07/07/19   [name], DO  docusate sodium (COLACE) 100 MG capsule Take 1 capsule (100 mg total) by mouth 2 (two) times daily. 11/06/19 12/06/19  [name], NP  DULoxetine (CYMBALTA) 30 MG capsule Take 1 capsule (30 mg total) by mouth daily. 11/06/19 02/04/20  [name], NP  ferrous sulfate 325 (65 FE) MG tablet Take 1 tablet (325 mg total) by mouth daily with breakfast. 11/06/19 12/06/19  [name], NP   fluticasone (FLONASE) 50 MCG/ACT nasal spray Place 2 sprays into both nostrils daily. 08/07/19   [name], NP  Fluticasone-Salmeterol (ADVAIR DISKUS) 500-50 MCG/DOSE AEPB Inhale 1 puff into the lungs in the morning and at bedtime. 11/06/19   [name], NP  hydrochlorothiazide (HYDRODIURIL) 25 MG tablet Take 1 tablet (25 mg total) by mouth daily. 11/06/19 02/04/20  [name], NP  lisinopril (ZESTRIL) 40 MG tablet TAKE 1 TABLET (40 MG TOTAL) BY MOUTH DAILY. 10/09/19   [name], MD  loratadine (CLARITIN) 10 MG tablet Take 1 tablet (10 mg total) by mouth daily. 05/29/19   [name], NP  Masks MISC Nebulizer face mask [id number] 05/27/19   [name], NP  montelukast (SINGULAIR) 10 MG tablet Take 1 tablet (10 mg total) by mouth at bedtime. 07/07/19   [name], DO  omeprazole (PRILOSEC) 20 MG capsule Take 1 capsule (20 mg total) by mouth daily. 11/06/19 02/04/20  [name], NP  OXYGEN Inhale into the lungs. 3 liters    [provider]  potassium chloride 20 MEQ TBCR Take 20 mEq by mouth daily for 7 days. 11/07/19 11/14/19  [name], NP    Allergies    Patient has no known allergies.  Review of Systems   Review of Systems  All other systems reviewed and are negative.   Physical Exam Updated Vital Signs BP (!) 154/96   Pulse 84   Temp 97.7 F (36.5 C) (Oral)   Resp (!) 30   Ht 5\' 4"  (1.626 m)   Wt 115 kg   LMP 11/01/2019   SpO2 100%   BMI 43.52 kg/m   Physical Exam Vitals and nursing note reviewed.  Constitutional:      General: She is not in acute distress.    Appearance: She is well-developed.  HENT:     Head: Normocephalic and atraumatic.  Eyes:     Conjunctiva/sclera: Conjunctivae normal.  Cardiovascular:     Rate and Rhythm: Normal rate and regular rhythm.     Heart sounds: No murmur heard.   Pulmonary:     Effort: Pulmonary effort is normal. No respiratory distress.     Breath sounds: Wheezing present.      Comments: Tachypneic with mild wheezes bilaterally Abdominal:     Palpations: Abdomen is soft.     Tenderness: There is abdominal tenderness.  Musculoskeletal:     Cervical back: Neck supple.  Skin:  General: Skin is warm and dry.  Neurological:     Mental Status: She is alert and oriented to person, place, and time.  Psychiatric:        Mood and Affect: Mood normal.        Behavior: Behavior normal.     ED Results / Procedures / Treatments   Labs (all labs ordered are listed, but only abnormal results are displayed) Labs Reviewed  BASIC METABOLIC PANEL - Abnormal; Notable for the following components:      Result Value   Potassium 3.3 (*)    Chloride 97 (*)    Glucose, Bld 124 (*)    All other components within normal limits  CBC - Abnormal; Notable for the following components:   WBC 11.2 (*)    Hemoglobin 10.4 (*)    HCT 33.6 (*)    MCH 25.5 (*)    All other components within normal limits  BRAIN NATRIURETIC PEPTIDE - Abnormal; Notable for the following components:   B Natriuretic Peptide 136.5 (*)    All other components within normal limits  RESPIRATORY PANEL BY RT PCR (FLU A&B, COVID)  I-STAT BETA HCG BLOOD, ED ([facility], [facility], [facility] ONLY)  TROPONIN I (HIGH SENSITIVITY)  TROPONIN I (HIGH SENSITIVITY)    EKG EKG Interpretation  Date/Time:  Friday November 27 2019 01:13:41 EDT Ventricular Rate:  78 PR Interval:  148 QRS Duration: 86 QT Interval:  412 QTC Calculation: 469 R Axis:   76 Text Interpretation: Normal sinus rhythm Nonspecific T wave abnormality Prolonged QT Abnormal ECG When compared with ECG of 05/20/2019, HEART RATE has decreased Confirmed by Delora Fuel ([id number]) on 11/27/2019 2:31:16 AM   Radiology DG Chest 1 View  Result Date: 11/27/2019 CLINICAL DATA:  Shortness of breath. EXAM: CHEST  1 VIEW COMPARISON:  July 03, 2019 FINDINGS: Bullous emphysematous changes are again seen involving the bilateral upper lobes, right greater than left. There is no  evidence of acute infiltrate, pleural effusion or pneumothorax. Both lungs are clear. The visualized skeletal structures are unremarkable. IMPRESSION: Bullous emphysematous changes without evidence of acute or active cardiopulmonary disease. Electronically Signed   By: Virgina Norfolk M.D.   On: 11/27/2019 01:31    Procedures Procedures (including critical care time)  Medications Ordered in ED Medications  iohexol (OMNIPAQUE) 300 MG/ML solution 100 mL (has no administration in time range)  ipratropium-albuterol (DUONEB) 0.5-2.5 (3) MG/3ML nebulizer solution 3 mL (3 mLs Nebulization Given 11/27/19 0433)  potassium chloride SA (KLOR-CON) CR tablet 40 mEq (40 mEq Oral Given 11/27/19 0531)  methylPREDNISolone sodium succinate (SOLU-MEDROL) 125 mg/2 mL injection 125 mg (125 mg Intravenous Given 11/27/19 0529)  ipratropium-albuterol (DUONEB) 0.5-2.5 (3) MG/3ML nebulizer solution 3 mL (3 mLs Nebulization Given 11/27/19 0507)    ED Course  I have reviewed the triage vital signs and the nursing notes.  Pertinent labs & imaging results that were available during my care of the patient were reviewed by me and considered in my medical decision making (see chart for details).    MDM Rules/Calculators/A&P                          Patient here with suspected COPD exacerbation.  She has history of the same.  She wears oxygen at home at baseline (3 L nasal cannula).  She denies any fever, chills, or productive cough.  Will give albuterol treatment and Solu-Medrol.  Patient feels improved regarding her breathing, but is still complaining of  abdominal pain.  She is noted to have mildly elevated white blood cell count.  She does not appear septic or toxic, but will check CT abdomen/pelvis for further evaluation of her abdominal pain.  CT is still pending.  Patient signed out at shift change to [place], [place], who will continue care. Final Clinical Impression(s) / ED Diagnoses Final diagnoses:  None     Rx / DC Orders ED Discharge Orders    None       Montine Circle, PA-C 11/27/19 [id number]    Merrily Pew, MD 11/27/19 2300

## 2019-11-27 NOTE — ED Triage Notes (Addendum)
Per pt she has hx of COPD and yesterday she started having increasing SOB. Pt said she is having a small amount of chest pain that is in the center of her chest when she takes deep breath. No coughs, no fevers, no chills. Pt is nauseated and actively vomiting in triage

## 2019-11-27 NOTE — ED Notes (Signed)
Pt unable to ambulate due to shortness of breath

## 2019-11-30 ENCOUNTER — Telehealth: Payer: Self-pay

## 2019-11-30 DIAGNOSIS — R32 Unspecified urinary incontinence: Secondary | ICD-10-CM | POA: Diagnosis not present

## 2019-11-30 DIAGNOSIS — D869 Sarcoidosis, unspecified: Secondary | ICD-10-CM | POA: Diagnosis not present

## 2019-11-30 DIAGNOSIS — J439 Emphysema, unspecified: Secondary | ICD-10-CM | POA: Diagnosis not present

## 2019-11-30 DIAGNOSIS — J849 Interstitial pulmonary disease, unspecified: Secondary | ICD-10-CM | POA: Diagnosis not present

## 2019-11-30 NOTE — Telephone Encounter (Signed)
Transition Care Management Follow-up Telephone Call  Date of discharge and from where: 11/27/2019 [facility]   How have you been since you were released from the hospital?Doong well  Any questions or concerns? No  Items Reviewed:  Did the pt receive and understand the discharge instructions provided? Yes   Medications obtained and verified? Yes   Other? No   Any new allergies since your discharge? No   Dietary orders reviewed? Yes  Do you have support at home? Yes   Home Care and Equipment/Supplies: Were home health services ordered? not applicable If so, what is the name of the agency?   Has the agency set up a time to come to the patient's home? not applicable Were any new equipment or medical supplies ordered?  No What is the name of the medical supply agency?  Were you able to get the supplies/equipment? not applicable Do you have any questions related to the use of the equipment or supplies? No  Functional Questionnaire: (I = Independent and D = Dependent) ADLs: I  Bathing/Dressing- I  Meal Prep- I  Eating- I  Maintaining continence- I  Transferring/Ambulation- I  Managing Meds- I  Follow up appointments reviewed:   PCP Hospital f/u appt confirmed? No    Specialist Hospital f/u appt confirmed? Yes  She is in the process of making appt with pulmonary  Are transportation arrangements needed? No   If their condition worsens, is the pt aware to call PCP or go to the Emergency Dept.? Yes  Was the patient provided with contact information for the PCP's office or ED? Yes  Was to pt encouraged to call back with questions or concerns? Yes

## 2019-12-01 ENCOUNTER — Telehealth: Payer: Self-pay

## 2019-12-01 NOTE — Telephone Encounter (Signed)
Copied from [place] [id number]. Topic: Appointment Scheduling - Scheduling Inquiry for Clinic >> Dec 01, 2019 11:35 AM Teresa Mendez wrote: Reason for CRM: Patient was recently in the emergency department and will need a follow up ,there are none available.

## 2019-12-08 ENCOUNTER — Other Ambulatory Visit: Payer: Self-pay | Admitting: Nurse Practitioner

## 2019-12-08 DIAGNOSIS — S46911A Strain of unspecified muscle, fascia and tendon at shoulder and upper arm level, right arm, initial encounter: Secondary | ICD-10-CM

## 2019-12-08 MED FILL — predniSONE 20 MG TABS: 20 | 5 days supply | Qty: 10 | Fill #0

## 2019-12-08 MED FILL — MONTELUKAST SOD 10 MG TAB: 10 | 30 days supply | Qty: 30 | Fill #2

## 2019-12-14 ENCOUNTER — Encounter: Payer: Self-pay | Admitting: Nurse Practitioner

## 2019-12-17 ENCOUNTER — Inpatient Hospital Stay: Payer: Medicaid Other | Admitting: Physician Assistant

## 2019-12-22 ENCOUNTER — Inpatient Hospital Stay: Payer: Medicaid Other | Admitting: Internal Medicine

## 2019-12-30 DIAGNOSIS — D869 Sarcoidosis, unspecified: Secondary | ICD-10-CM | POA: Diagnosis not present

## 2019-12-30 DIAGNOSIS — J439 Emphysema, unspecified: Secondary | ICD-10-CM | POA: Diagnosis not present

## 2019-12-30 DIAGNOSIS — R32 Unspecified urinary incontinence: Secondary | ICD-10-CM | POA: Diagnosis not present

## 2019-12-30 DIAGNOSIS — J849 Interstitial pulmonary disease, unspecified: Secondary | ICD-10-CM | POA: Diagnosis not present

## 2020-01-20 ENCOUNTER — Ambulatory Visit: Payer: Medicaid Other | Admitting: Pulmonary Disease

## 2020-01-30 DIAGNOSIS — D869 Sarcoidosis, unspecified: Secondary | ICD-10-CM | POA: Diagnosis not present

## 2020-01-30 DIAGNOSIS — J439 Emphysema, unspecified: Secondary | ICD-10-CM | POA: Diagnosis not present

## 2020-01-30 DIAGNOSIS — R32 Unspecified urinary incontinence: Secondary | ICD-10-CM | POA: Diagnosis not present

## 2020-01-30 DIAGNOSIS — J849 Interstitial pulmonary disease, unspecified: Secondary | ICD-10-CM | POA: Diagnosis not present

## 2020-02-03 ENCOUNTER — Ambulatory Visit: Payer: Medicaid Other | Admitting: Pulmonary Disease

## 2020-02-03 NOTE — Progress Notes (Deleted)
@Patient  ID: , female    DOB: 1975/12/06, 45 y.o.   MRN: [id number]  No chief complaint on file.   Referring provider: [id number], NP  HPI:  45 year old female former smoker followed in our office for asthma, emphysema and pulmonary sarcoid.   PMH: Hypertension Smoker/ Smoking History: Former smoker Maintenance: Advair 500 Pt of: Dr. 49  02/03/2020  - Visit     Questionaires / Pulmonary Flowsheets:   ACT:  No flowsheet data found.  MMRC: No flowsheet data found.  Epworth:  No flowsheet data found.  Tests:   05/17/2019-CTA chest-bullous emphysematous changes in upper lobes, groundglass opacity posterior laterally in left lung base could reflect early infiltrate or pneumonia, no evidence of PE  07/03/2019-chest x-ray-no acute cardiopulmonary disease  05/18/2019-echocardiogram-LV ejection fraction 60 to 65%, mild LVH, right ventricular systolic function is normal  07/14/2007-pulmonary function test-FVC 3.29 (89% predicted), postbronchodilator ratio 57, postbronchodilator FEV1 2.05 (70% predicted), positive bronchodilator response in FEV1, mid flow reversibility, DLCO 15.1 (58% predicted)  07/03/2019-pulmonary function test-FVC 1.89 (60% predicted), postbronchodilator ratio 50, postbronchodilator FEV1 1.20 (47% duty), positive bronchodilator response, mid flow reversibility, DLCO 17.25 (76% predicted)  07/07/2019-IgE-22 07/07/2019-C-reactive protein-1.3 07/07/2019-sed rate-40  FENO:  No results found for: NITRICOXIDE  PFT: PFT Results Latest Ref Rng & Units 07/03/2019  FVC-Pre L 1.89  FVC-Predicted Pre % 60  FVC-Post L 2.41  FVC-Predicted Post % 76  Pre FEV1/FVC % % 47  Post FEV1/FCV % % 50  FEV1-Pre L 0.89  FEV1-Predicted Pre % 34  FEV1-Post L 1.20  DLCO uncorrected ml/min/mmHg 17.25  DLCO UNC% % 76  DLCO corrected ml/min/mmHg 19.39  DLCO COR %Predicted % 86  DLVA Predicted % 116  TLC L 4.65  TLC % Predicted % 88  RV % Predicted % 140     WALK:  SIX MIN WALK 07/08/2019 07/03/2019  Supplimental Oxygen during Test? (L/min) Yes Yes  O2 Flow Rate 3 3  Type Pulse Continuous  Tech Comments: Patient walked slow pace and maintained good O2 sats Pt walked at a slow pace wheeling O2 tank. Pt had mild SOB, lower back pain, and mild CP and was only able to walk 1 lap.    Imaging: No results found.  Lab Results:  CBC    Component Value Date/Time   WBC 11.2 (H) 11/27/2019 0117   RBC 4.08 11/27/2019 0117   HGB 10.4 (L) 11/27/2019 0117   HGB 10.4 (L) 11/06/2019 1519   HCT 33.6 (L) 11/27/2019 0117   HCT 32.4 (L) 11/06/2019 1519   PLT 312 11/27/2019 0117   PLT 333 11/06/2019 1519   MCV 82.4 11/27/2019 0117   MCV 80 11/06/2019 1519   MCH 25.5 (L) 11/27/2019 0117   MCHC 31.0 11/27/2019 0117   RDW 13.1 11/27/2019 0117   RDW 12.9 11/06/2019 1519   LYMPHSABS 2,930 07/03/2019 1647   LYMPHSABS 2.3 06/03/2019 1441   MONOABS 0.4 05/22/2019 0702   EOSABS 168 07/03/2019 1647   EOSABS 0.3 06/03/2019 1441   BASOSABS 30 07/03/2019 1647   BASOSABS 0.0 06/03/2019 1441    BMET    Component Value Date/Time   NA 137 11/27/2019 0117   NA 141 11/06/2019 1519   K 3.3 (L) 11/27/2019 0117   CL 97 (L) 11/27/2019 0117   CO2 32 11/27/2019 0117   GLUCOSE 124 (H) 11/27/2019 0117   BUN 6 11/27/2019 0117   BUN 12 11/06/2019 1519   CREATININE 0.82 11/27/2019 0117   CREATININE 0.85 07/03/2019  1647   CALCIUM 8.9 11/27/2019 0117   GFRNONAA >60 11/27/2019 0117   GFRAA 96 11/06/2019 1519    BNP    Component Value Date/Time   BNP 136.5 (H) 11/27/2019 0118   BNP 10 07/03/2019 1647    ProBNP No results found for: PROBNP  Specialty Problems      Pulmonary Problems   DYSPNEA    Qualifier: Diagnosis of  By: Doy Mince LPN, Megan        Intrinsic asthma    Qualifier: Diagnosis of  By: [name] MD, Armando Reichert       Acute respiratory failure with hypoxia ([place])   Community acquired pneumonia   Emphysema lung ([place])   Interstitial  pulmonary disease ([place])      No Known Allergies  Immunization History  Administered Date(s) Administered  . Influenza,inj,Quad PF,6+ Mos 10/13/2019    Past Medical History:  Diagnosis Date  . Asthma   . Hypertension   . Sarcoidosis   . Sickle cell trait ([place])     Tobacco History: Social History   Tobacco Use  Smoking Status Former Smoker  . Packs/day: 0.50  . Types: Cigarettes  . Quit date: 01/03/2019  . Years since quitting: 1.0  Smokeless Tobacco Never Used   Counseling given: Not Answered   Continue to not smoke  Outpatient Encounter Medications as of 02/03/2020  Medication Sig  . albuterol (PROVENTIL) (2.5 MG/3ML) 0.083% nebulizer solution Take 3 mLs (2.5 mg total) by nebulization every 6 (six) hours as needed for wheezing or shortness of breath.  Marland Kitchen albuterol (VENTOLIN HFA) 108 (90 Base) MCG/ACT inhaler Inhale 1-2 puffs into the lungs every 6 (six) hours as needed for wheezing or shortness of breath (cough).  Marland Kitchen amLODipine (NORVASC) 10 MG tablet Take 1 tablet (10 mg total) by mouth daily.  Marland Kitchen aspirin EC 81 MG tablet Take 81 mg by mouth daily.  Marland Kitchen atorvastatin (LIPITOR) 20 MG tablet Take 1 tablet (20 mg total) by mouth daily.  . carvedilol (COREG) 3.125 MG tablet Take 1 tablet (3.125 mg total) by mouth 2 (two) times daily with a meal.  . cetirizine (ZYRTEC ALLERGY) 10 MG tablet Take 1 tablet (10 mg total) by mouth daily.  . DULoxetine (CYMBALTA) 30 MG capsule Take 1 capsule (30 mg total) by mouth daily.  . ferrous sulfate 325 (65 FE) MG tablet Take 1 tablet (325 mg total) by mouth daily with breakfast.  . fluticasone (FLONASE) 50 MCG/ACT nasal spray Place 2 sprays into both nostrils daily.  . Fluticasone-Salmeterol (ADVAIR DISKUS) 500-50 MCG/DOSE AEPB Inhale 1 puff into the lungs in the morning and at bedtime.  . hydrochlorothiazide (HYDRODIURIL) 25 MG tablet Take 1 tablet (25 mg total) by mouth daily.  Marland Kitchen lisinopril (ZESTRIL) 40 MG tablet TAKE 1 TABLET (40 MG TOTAL) BY  MOUTH DAILY.  Marland Kitchen loratadine (CLARITIN) 10 MG tablet Take 1 tablet (10 mg total) by mouth daily.  . Masks MISC Nebulizer face mask J45.909  . montelukast (SINGULAIR) 10 MG tablet Take 1 tablet (10 mg total) by mouth at bedtime.  Marland Kitchen omeprazole (PRILOSEC) 20 MG capsule Take 1 capsule (20 mg total) by mouth daily.  . OXYGEN Inhale into the lungs. 3 liters  . potassium chloride 20 MEQ TBCR Take 20 mEq by mouth daily for 7 days.   No facility-administered encounter medications on file as of 02/03/2020.     Review of Systems  Review of Systems   Physical Exam  There were no vitals taken for this visit.  Wt Readings from Last 5 Encounters:  11/27/19 253 lb 8.5 oz (115 kg)  11/06/19 254 lb (115.2 kg)  09/29/19 254 lb 9.6 oz (115.5 kg)  09/07/19 254 lb (115.2 kg)  07/07/19 252 lb 12.8 oz (114.7 kg)    BMI Readings from Last 5 Encounters:  11/27/19 43.52 kg/m  11/06/19 43.60 kg/m  09/29/19 43.70 kg/m  09/07/19 43.60 kg/m  07/07/19 43.39 kg/m     Physical Exam    Assessment & Plan:   No problem-specific Assessment & Plan notes found for this encounter.    No follow-ups on file.   Lauraine Rinne, NP 02/03/2020   This appointment required *** minutes of patient care (this includes precharting, chart review, review of results, face-to-face care, etc.).

## 2020-02-08 ENCOUNTER — Ambulatory Visit: Payer: Medicaid Other | Admitting: Nurse Practitioner

## 2020-02-08 ENCOUNTER — Telehealth: Payer: Self-pay | Admitting: Nurse Practitioner

## 2020-02-08 NOTE — Telephone Encounter (Signed)
Copied from [place] [id number]. Topic: General - Other >> Feb 08, 2020  9:25 AM Leward Quan A wrote: Reason for CRM: Patient called in to inform Geryl Rankins say that [employer] have faxed over paper work to be sinned by her so that patient can get new tubing for her nebulizer machine. Asking if this form can please be signed and re faxed to [employer]. Any questions please call patient at  Ph# [phone]

## 2020-02-11 ENCOUNTER — Ambulatory Visit: Payer: Medicaid Other | Admitting: Pulmonary Disease

## 2020-02-15 NOTE — Telephone Encounter (Signed)
Will look for the document and fax it once it is sign.

## 2020-02-17 DIAGNOSIS — R32 Unspecified urinary incontinence: Secondary | ICD-10-CM | POA: Diagnosis not present

## 2020-02-17 MED FILL — MONTELUKAST SOD 10 MG TAB: 10 | 30 days supply | Qty: 30 | Fill #3

## 2020-02-17 MED FILL — ALBUTEROL 0.083% INHAL SOLN: (2.5 MG/3ML | 12 days supply | Qty: 150 | Fill #1

## 2020-02-17 MED FILL — PROAIR HFA 90 MCG INHALER: 108 (90 BAS | 11 days supply | Qty: 9 | Fill #0

## 2020-02-17 NOTE — Telephone Encounter (Signed)
CMA refaxed the form.

## 2020-02-24 DIAGNOSIS — H524 Presbyopia: Secondary | ICD-10-CM | POA: Diagnosis not present

## 2020-02-26 ENCOUNTER — Other Ambulatory Visit: Payer: Self-pay | Admitting: Obstetrics and Gynecology

## 2020-02-26 ENCOUNTER — Other Ambulatory Visit: Payer: Self-pay

## 2020-02-26 NOTE — Patient Instructions (Signed)
Hi Ms. Encinas, thank you for speaking with me today.  Ms. Perkin was given information about Medicaid Managed Care team care coordination services as a part of their Healthy [facility] Medicaid benefit. Valla Leaver Mian verbally consented to engagement with the [facility] Managed Care team.   For questions related to your Healthy [facility] health plan, please call: [phone] or visit the homepage here: [email or web address]  If you would like to schedule transportation through your Healthy [facility] plan, please call the following number at least 2 days in advance of your appointment: [phone]  Ms. Halliday - following are the goals we discussed in your visit today:  Goals Addressed            This Visit's Progress   . [place] My Quality of Life       Timeframe:  Long-Range Goal Priority:  Medium Start Date:           02/26/20                  Expected End Date:    05/26/20                   Follow Up Date 03/28/20   - discuss my treatment options with the doctor or nurse - make shared treatment decisions with doctor        Patient verbalizes understanding of instructions provided today.   The Managed Medicaid care management team will reach out to the patient again over the next 30 days.  The patient has been provided with contact information for the Managed Medicaid care management team and has been advised to call with any health related questions or concerns.   Aida Raider RN, BSN [place]  [facility] - Managed Medicaid High Risk [phone]. Following is a copy of your plan of care:  Patient Care Plan: General Plan of Care (Adult)    Problem Identified: Health Promotion or Disease Self-Management (General Plan of Care)   Priority: Medium  Onset Date: 02/26/2020    Long-Range Goal: Self-Management Plan Developed   Start Date: 02/26/2020  Expected End Date: 05/26/2020  This Visit's  Progress: On track  Priority: Medium  Note:   Current Barriers:  . Chronic Disease Management support.  Nurse Case Manager Clinical Goal(s):  Marland Kitchen Over the next 90 days, patient will attend all scheduled medical appointments . Over the next 30 days, patient will work with CM team pharmacist to review medications.  Interventions:  . Inter-disciplinary care team collaboration (see longitudinal plan of care) . Reviewed medications with patient. [name] with pharmacy regarding medications. . Discussed plans with patient for ongoing care management follow up and provided patient with direct contact information for care management team . Reviewed scheduled/upcoming provider appointments. . Pharmacy referral for medication review.  Patient Goals/Self-Care Activities Over the next 90 days, patient will:  -Self administers medications as prescribed Attends all scheduled provider appointments Calls pharmacy for medication refills Calls provider office for new concerns or questions  Follow Up Plan: The Managed Medicaid care management team will reach out to the patient again over the next 30 days.  The patient has been provided with contact information for the Managed Medicaid care management team and has been advised to call with any health related questions or concerns.

## 2020-02-26 NOTE — Patient Outreach (Signed)
Medicaid Managed Care   Nurse Care Manager Note  02/26/2020 Name:  Teresa Mendez MRN:  [id number] DOB:  1975-10-08  Teresa Mendez is an 45 y.o. year old female who is a primary patient of Teresa Pounds, NP.  The [facility] Managed Care Coordination team was consulted for assistance with:    chronic healthcare management needs.  Teresa Mendez was given information about Medicaid Managed Care Coordination team services today. Teresa Mendez agreed to services and verbal consent obtained.  Engaged with patient by telephone for initial visit in response to provider referral for case management and/or care coordination services.   Assessments/Interventions:  Review of past medical history, allergies, medications, health status, including review of consultants reports, laboratory and other test data, was performed as part of comprehensive evaluation and provision of chronic care management services.  SDOH (Social Determinants of Health) assessments and interventions performed:   Care Plan  No Known Allergies  Medications Reviewed Today    Reviewed by Teresa Medicus, RN (Registered Nurse) on 02/26/20 at 14  Med List Status: <None>  Medication Order Taking? Sig Documenting Provider Last Dose Status Informant  albuterol (PROVENTIL) (2.5 MG/3ML) 0.083% nebulizer solution [id number] Yes Take 3 mLs (2.5 mg total) by nebulization every 6 (six) hours as needed for wheezing or shortness of breath. [name], DO Taking Active   albuterol (VENTOLIN HFA) 108 (90 Base) MCG/ACT inhaler [id number] Yes Inhale 1-2 puffs into the lungs every 6 (six) hours as needed for wheezing or shortness of breath (cough). Teresa Pounds, NP Taking Active   amLODipine (NORVASC) 10 MG tablet [id number]  Take 1 tablet (10 mg total) by mouth daily.  Patient taking differently: Take 10 mg by mouth daily. Taking   Teresa Pounds, NP  Expired 02/04/20 2359 Self  aspirin EC 81 MG tablet [id number] Yes Take 81 mg by mouth  daily. [provider] Taking Active   atorvastatin (LIPITOR) 20 MG tablet [id number] Yes Take 1 tablet (20 mg total) by mouth daily. Teresa Pounds, NP Taking Active   carvedilol (COREG) 3.125 MG tablet [id number]  Take 1 tablet (3.125 mg total) by mouth 2 (two) times daily with a meal.  Patient taking differently: Take 3.125 mg by mouth 2 (two) times daily with a meal. taking   Teresa Pounds, NP  Expired 02/04/20 2359 Self  cetirizine (ZYRTEC ALLERGY) 10 MG tablet [id number] No Take 1 tablet (10 mg total) by mouth daily.  Patient not taking: Reported on 02/26/2020   [name], DO Not Taking Active   DULoxetine (CYMBALTA) 30 MG capsule [id number]  Take 1 capsule (30 mg total) by mouth daily.  Patient taking differently: Take 30 mg by mouth daily. taking   Teresa Pounds, NP  Expired 02/04/20 2359 Self  ferrous sulfate 325 (65 FE) MG tablet [id number]  Take 1 tablet (325 mg total) by mouth daily with breakfast.  Patient taking differently: Take 325 mg by mouth daily with breakfast. taking   Teresa Pounds, NP  Expired 12/06/19 2359 Self  fluticasone (FLONASE) 50 MCG/ACT nasal spray [id number] Yes Place 2 sprays into both nostrils daily. Teresa Pounds, NP Taking Active   Fluticasone-Salmeterol (ADVAIR DISKUS) 500-50 MCG/DOSE AEPB [id number] Yes Inhale 1 puff into the lungs in the morning and at bedtime. Teresa Pounds, NP Taking Active   hydrochlorothiazide (HYDRODIURIL) 25 MG tablet [id number]  Take 1 tablet (25 mg total) by mouth daily.  Patient taking differently: Take 25 mg  by mouth daily. taking   Teresa Pounds, NP  Expired 02/04/20 2359 Self  lisinopril (ZESTRIL) 40 MG tablet [id number] Yes TAKE 1 TABLET (40 MG TOTAL) BY MOUTH DAILY. [name], MD Taking Active   loratadine (CLARITIN) 10 MG tablet [id number] No Take 1 tablet (10 mg total) by mouth daily.  Patient not taking: Reported on 02/26/2020   [name], NP Not Taking Active   Masks [place] [id number]  Yes Nebulizer face mask J45.909 Teresa Pounds, NP Taking Active   montelukast (SINGULAIR) 10 MG tablet [id number] Yes Take 1 tablet (10 mg total) by mouth at bedtime. [name], DO Taking Active   omeprazole (PRILOSEC) 20 MG capsule [id number]  Take 1 capsule (20 mg total) by mouth daily.  Patient taking differently: Take 20 mg by mouth daily. taking   Teresa Pounds, NP  Expired 02/04/20 2359 Self  OXYGEN [id number] Yes Inhale into the lungs. 3 liters [provider] Taking Active   potassium chloride 20 MEQ TBCR [id number]  Take 20 mEq by mouth daily for 7 days. Teresa Pounds, NP  Expired 11/14/19 2359           Patient Active Problem List   Diagnosis Date Noted  . Gastroesophageal reflux disease 09/07/2019  . Ground glass opacity present on imaging of lung 09/07/2019  . Interstitial pulmonary disease ([place]) 09/07/2019  . Emphysema lung ([place]) 07/03/2019  . Abnormal findings on diagnostic imaging of lung 07/03/2019  . Myalgia 07/03/2019  . Hypertensive urgency 05/18/2019  . Chest pain 05/18/2019  . Community acquired pneumonia   . Acute respiratory failure with hypoxia ([place]) 05/17/2019  . 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 Essential hypertension 06/27/2007  . DYSPNEA 06/27/2007    Conditions to be addressed/monitored per PCP order:  chronic healhcare needs-HTN, Asthma Sarcoidosis.  Care Plan : General Plan of Care (Adult)  Updates made by Teresa Medicus, RN since 02/26/2020 12:00 AM    Problem: Health Promotion or Disease Self-Management (General Plan of Care)   Priority: Medium  Onset Date: 02/26/2020    Long-Range Goal: Self-Management Plan Developed   Start Date: 02/26/2020  Expected End Date: 05/26/2020  This Visit's Progress: On track  Priority: Medium  Note:   Current Barriers:  . Chronic Disease Management support.  Nurse Case Manager Clinical Goal(s):  Marland Kitchen Over the next 90 days, patient will attend all scheduled  medical appointments . Over the next 30 days, patient will work with CM team pharmacist to review medications.  Interventions:  . Inter-disciplinary care team collaboration (see longitudinal plan of care) . Reviewed medications with patient. [name] with pharmacy regarding medications. . Discussed plans with patient for ongoing care management follow up and provided patient with direct contact information for care management team . Reviewed scheduled/upcoming provider appointments. . Pharmacy referral for medication review.  Patient Goals/Self-Care Activities Over the next 90 days, patient will:  -Self administers medications as prescribed Attends all scheduled provider appointments Calls pharmacy for medication refills Calls provider office for new concerns or questions  Follow Up Plan: The Managed Medicaid care management team will reach out to the patient again over the next 30 days.  The patient has been provided with contact information for the Managed Medicaid care management team and has been advised to call with any health related questions or concerns.            Follow Up:  Patient agrees to Care Plan and Follow-up.  Plan: The Managed Medicaid care management team will reach out to the patient again over the next 30 days. and The patient has been provided with contact information for the Managed Medicaid care management team and has been advised to call with any health related questions or concerns.  Date/time of next scheduled RN care management/care coordination outreach:  03/28/20 at 1030

## 2020-03-01 DIAGNOSIS — J439 Emphysema, unspecified: Secondary | ICD-10-CM | POA: Diagnosis not present

## 2020-03-01 DIAGNOSIS — D869 Sarcoidosis, unspecified: Secondary | ICD-10-CM | POA: Diagnosis not present

## 2020-03-01 DIAGNOSIS — J849 Interstitial pulmonary disease, unspecified: Secondary | ICD-10-CM | POA: Diagnosis not present

## 2020-03-04 ENCOUNTER — Other Ambulatory Visit: Payer: Self-pay | Admitting: Nurse Practitioner

## 2020-03-04 ENCOUNTER — Other Ambulatory Visit: Payer: Self-pay

## 2020-03-04 DIAGNOSIS — M2550 Pain in unspecified joint: Secondary | ICD-10-CM

## 2020-03-04 DIAGNOSIS — J4550 Severe persistent asthma, uncomplicated: Secondary | ICD-10-CM

## 2020-03-04 MED ORDER — ATORVASTATIN CALCIUM 80 MG PO TABS: 80.0000 mg | ORAL_TABLET | Freq: Every day | ORAL | 0 refills | Status: DC

## 2020-03-04 MED ORDER — CETIRIZINE HCL 10 MG PO TABS: 10.0000 mg | ORAL_TABLET | Freq: Every day | ORAL | 11 refills | Status: DC

## 2020-03-04 MED ORDER — DULOXETINE HCL 60 MG PO CPEP: 60.0000 mg | ORAL_CAPSULE | Freq: Every day | ORAL | 0 refills | Status: DC

## 2020-03-04 NOTE — Progress Notes (Signed)
HI Teresa Mendez,  I made the recommended changes. I would like to note that she has a few medications that are showing as expired and her zyrtec was prescribed for an entire year since last June and she hasn't been picking it up. The pharmacy won't automatically fill medications from that long ago unless she specifically requests them. I really appreciate the feedback. Also the cymbalta was expired as well which shows she really hasn't been taking it as prescribed. Please feel free to reach out at any time!! :)

## 2020-03-04 NOTE — Patient Outreach (Signed)
Medicaid Managed Care    Pharmacy Note  03/04/2020 Name: Teresa Mendez MRN: [id number] DOB: 05-09-1975  [name] is a 45 y.o. year old female who is a primary care patient of [name], NP. [facility] Managed Care Coordination team was consulted for assistance with disease management and care coordination needs.    Engaged with patient Engaged with patient by telephone for follow up visit in response to referral for case management and/or care coordination services.  Teresa Mendez was given information about Managed Medicaid Care Coordination team services today. Valla Leaver Mccormick agreed to services and verbal consent obtained.   Objective:  Lab Results  Component Value Date   CREATININE 0.82 11/27/2019   CREATININE 0.84 11/06/2019   CREATININE 0.85 07/03/2019    Lab Results  Component Value Date   HGBA1C 5.2 11/06/2019       Component Value Date/Time   CHOL 201 (H) 11/06/2019 1519   TRIG 113 11/06/2019 1519   HDL 47 11/06/2019 1519   CHOLHDL 4.3 11/06/2019 1519   LDLCALC 134 (H) 11/06/2019 1519    Other: (TSH, CBC, Vit D, etc.)  Clinical ASCVD: No  The 10-year ASCVD risk score Teresa Bussing DC Jr., et al., 2013) is: 7.9%   Values used to calculate the score:     Age: 73 years     Sex: Female     Is Non-Hispanic African American: Yes     Diabetic: No     Tobacco smoker: No     Systolic Blood Pressure: 086 mmHg     Is BP treated: Yes     HDL Cholesterol: 47 mg/dL     Total Cholesterol: 201 mg/dL    Other: (CHADS2VASc if Afib, PHQ9 if depression, MMRC or CAT for COPD, ACT, DEXA)  BP Readings from Last 3 Encounters:  11/27/19 (!) 155/92  11/06/19 (!) 151/98  09/29/19 (!) 140/100    Assessment/Interventions: Review of patient past medical history, allergies, medications, health status, including review of consultants reports, laboratory and other test data, was performed as part of comprehensive evaluation and provision of chronic care management services.     HTN Home BP: 143/90 but this was months ago -Carvedilol 3.125 -Amlodipine 10mg  -HCTZ 25mg  Lisinopril 40mg  Plan: Check BP daily for 1 week. F/U and will potentially adjust therapy after 1 week of consistent data   Lipids Lab Results  Component Value Date   CHOL 201 (H) 11/06/2019   CHOL 146 08/22/2016   Lab Results  Component Value Date   HDL 47 11/06/2019   HDL 47 08/22/2016   Lab Results  Component Value Date   LDLCALC 134 (H) 11/06/2019   LDLCALC 83 08/22/2016   Lab Results  Component Value Date   TRIG 113 11/06/2019   TRIG 78 08/22/2016   Lab Results  Component Value Date   CHOLHDL 4.3 11/06/2019   CHOLHDL 3.1 08/22/2016   No results found for: LDLDIRECT -Atorvastatin 20mg  Plan: LDL is too high, will ask PCP for higher dose statin   Pain -Medication isn't helping pain at all. Had Dr. Hilaria Ota with Ortho in Jan but she showed up late and they wouldn't see her Duloxetine 30mg  Plan: Ask PCP to try 60mg  though this probably won't help with pain, this should help with her mother passing away  GERD OTC Prilosec -states no Hx of ulcers/bleeding Plan: Taper down   Allergies Monteleukast Cetirizine Plan: PCP only refilled Monteleukast, will ask about Cetirizine  SDOH (Social Determinants of Health) assessments  and interventions performed:    Care Plan  No Known Allergies  Medications Reviewed Today    Reviewed by Teresa Medicus, RN (Registered Nurse) on 02/26/20 at 110  Med List Status: <None>  Medication Order Taking? Sig Documenting Provider Last Dose Status Informant  albuterol (PROVENTIL) (2.5 MG/3ML) 0.083% nebulizer solution [id number] Yes Take 3 mLs (2.5 mg total) by nebulization every 6 (six) hours as needed for wheezing or shortness of breath. [name], DO Taking Active   albuterol (VENTOLIN HFA) 108 (90 Base) MCG/ACT inhaler [id number] Yes Inhale 1-2 puffs into the lungs every 6 (six) hours as needed for wheezing or shortness of breath  (cough). [name], NP Taking Active   amLODipine (NORVASC) 10 MG tablet [id number]  Take 1 tablet (10 mg total) by mouth daily.  Patient taking differently: Take 10 mg by mouth daily. Taking   [name], NP  Expired 02/04/20 2359 Self  aspirin EC 81 MG tablet [id number] Yes Take 81 mg by mouth daily. [provider] Taking Active   atorvastatin (LIPITOR) 20 MG tablet [id number] Yes Take 1 tablet (20 mg total) by mouth daily. [name], NP Taking Active   carvedilol (COREG) 3.125 MG tablet [id number]  Take 1 tablet (3.125 mg total) by mouth 2 (two) times daily with a meal.  Patient taking differently: Take 3.125 mg by mouth 2 (two) times daily with a meal. taking   [name], NP  Expired 02/04/20 2359 Self  cetirizine (ZYRTEC ALLERGY) 10 MG tablet [id number] No Take 1 tablet (10 mg total) by mouth daily.  Patient not taking: Reported on 02/26/2020   [name], DO Not Taking Active   DULoxetine (CYMBALTA) 30 MG capsule [id number]  Take 1 capsule (30 mg total) by mouth daily.  Patient taking differently: Take 30 mg by mouth daily. taking   [name], NP  Expired 02/04/20 2359 Self  ferrous sulfate 325 (65 FE) MG tablet [id number]  Take 1 tablet (325 mg total) by mouth daily with breakfast.  Patient taking differently: Take 325 mg by mouth daily with breakfast. taking   [name], NP  Expired 12/06/19 2359 Self  fluticasone (FLONASE) 50 MCG/ACT nasal spray [id number] Yes Place 2 sprays into both nostrils daily. [name], NP Taking Active   Fluticasone-Salmeterol (ADVAIR DISKUS) 500-50 MCG/DOSE AEPB [id number] Yes Inhale 1 puff into the lungs in the morning and at bedtime. [name], NP Taking Active   hydrochlorothiazide (HYDRODIURIL) 25 MG tablet [id number]  Take 1 tablet (25 mg total) by mouth daily.  Patient taking differently: Take 25 mg by mouth daily. taking   [name], NP  Expired 02/04/20 2359 Self  lisinopril (ZESTRIL)  40 MG tablet [id number] Yes TAKE 1 TABLET (40 MG TOTAL) BY MOUTH DAILY. [name], MD Taking Active   loratadine (CLARITIN) 10 MG tablet [id number] No Take 1 tablet (10 mg total) by mouth daily.  Patient not taking: Reported on 02/26/2020   [name], NP Not Taking Active   Masks [place] [id number] Yes Nebulizer face mask J45.909 [name], NP Taking Active   montelukast (SINGULAIR) 10 MG tablet [id number] Yes Take 1 tablet (10 mg total) by mouth at bedtime. [name], DO Taking Active   omeprazole (PRILOSEC) 20 MG capsule [id number]  Take 1 capsule (20 mg total) by mouth daily.  Patient taking differently: Take 20 mg by mouth daily. taking   [name], NP  Expired  02/04/20 2359 Self  OXYGEN [id number] Yes Inhale into the lungs. 3 liters [provider] Taking Active   potassium chloride 20 MEQ TBCR [id number]  Take 20 mEq by mouth daily for 7 days. [name], NP  Expired 11/14/19 2359           Patient Active Problem List   Diagnosis Date Noted  . Gastroesophageal reflux disease 09/07/2019  . Ground glass opacity present on imaging of lung 09/07/2019  . Interstitial pulmonary disease ([place]) 09/07/2019  . Emphysema lung ([place]) 07/03/2019  . Abnormal findings on diagnostic imaging of lung 07/03/2019  . Myalgia 07/03/2019  . Hypertensive urgency 05/18/2019  . Chest pain 05/18/2019  . Community acquired pneumonia   . Acute respiratory failure with hypoxia ([place]) 05/17/2019  . 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 Essential hypertension 06/27/2007  . DYSPNEA 06/27/2007    Conditions to be addressed/monitored: HTN, Hypertriglyceridemia and Depression  Care Plan : Medication Management  Updates made by Lane Hacker, [place] since 03/04/2020 12:00 AM    Problem: Health Promotion or Disease Self-Management (General Plan of Care)     Goal: Medication Management   Note:   Current Barriers:  . Lost mother recently and is  upset about that. This comes on the heels of losing her brother and Uncle last year . Not sure what medications are and what they're for . Not taking BP consistently  Pharmacist Clinical Goal(s):  Marland Kitchen Over the next 7 days, patient will achieve adherence to monitoring guidelines and medication adherence to achieve therapeutic efficacy through collaboration with PharmD and provider.  .   Interventions: . Inter-disciplinary care team collaboration (see longitudinal plan of care) . Comprehensive medication review performed; medication list updated in electronic medical record  @RXCPHYPERTENSION @ @RXCPHYPERLIPIDEMIA @  Patient Goals/Self-Care Activities . Over the next 7 days, patient will:  - check blood pressure Daily, document, and provide at future appointments  Follow Up Plan: The care management team will reach out to the patient again over the next 7 days.     Task: Mutually Develop and Royce Macadamia Achievement of Patient Goals   Note:   Care Management Activities:    - verbalization of feelings encouraged    Notes:       Medication Assistance: None required. Patient affirms current coverage meets needs.   Follow up: Agree/   Plan: The care management team will reach out to the patient again over the next 7 days.   [place] Constable, Pharm.D., Managed Medicaid Pharmacist - [phone]

## 2020-03-04 NOTE — Patient Instructions (Signed)
Visit Information  [name] was given information about Medicaid Managed Care team care coordination services as a part of their Healthy [facility] Medicaid benefit. Teresa Mendez verbally consented to engagement with the [facility] Managed Care team.   For questions related to your Healthy [facility] health plan, please call: [phone] or visit the homepage here: [email or web address]  If you would like to schedule transportation through your Healthy [facility] plan, please call the following number at least 2 days in advance of your appointment: [phone]  Teresa Mendez - following are the goals we discussed in your visit today:  Goals Addressed            This Visit's Progress   . Manage My Medicine         Please see education materials related to HTN provided as print materials.   Patient verbalizes understanding of instructions provided today.   The Managed Medicaid care management team will reach out to the patient again over the next 7 days.   Teresa Mendez, [facility]  Following is a copy of your plan of care:  Patient Care Plan: General Plan of Care (Adult)    Problem Identified: Health Promotion or Disease Self-Management (General Plan of Care)   Priority: Medium  Onset Date: 02/26/2020    Long-Range Goal: Self-Management Plan Developed   Start Date: 02/26/2020  Expected End Date: 05/26/2020  This Visit's Progress: On track  Priority: Medium  Note:   Current Barriers:  . Chronic Disease Management support.  Nurse Case Manager Clinical Goal(s):  Marland Kitchen Over the next 90 days, patient will attend all scheduled medical appointments . Over the next 30 days, patient will work with CM team pharmacist to review medications.  Interventions:  . Inter-disciplinary care team collaboration (see longitudinal plan of care) . Reviewed medications with patient. Teresa Mendez with pharmacy regarding medications. . Discussed plans with patient for  ongoing care management follow up and provided patient with direct contact information for care management team . Reviewed scheduled/upcoming provider appointments. . Pharmacy referral for medication review.  Patient Goals/Self-Care Activities Over the next 90 days, patient will:  -Self administers medications as prescribed Attends all scheduled provider appointments Calls pharmacy for medication refills Calls provider office for new concerns or questions  Follow Up Plan: The Managed Medicaid care management team will reach out to the patient again over the next 30 days.  The patient has been provided with contact information for the Managed Medicaid care management team and has been advised to call with any health related questions or concerns.       Task: Mutually Develop and Royce Macadamia Achievement of Patient Goals   Note:   Care Management Activities:    - verbalization of feelings encouraged    Notes:    Patient Care Plan: Medication Management    Problem Identified: Health Promotion or Disease Self-Management (General Plan of Care)     Goal: Medication Management   Note:   Current Barriers:  . Lost mother recently and is upset about that. This comes on the heels of losing her brother and Uncle last year . Not sure what medications are and what they're for . Not taking BP consistently  Pharmacist Clinical Goal(s):  Marland Kitchen Over the next 7 days, patient will achieve adherence to monitoring guidelines and medication adherence to achieve therapeutic efficacy through collaboration with PharmD and provider.  .   Interventions: . Inter-disciplinary care team collaboration (see longitudinal plan of care) . Comprehensive medication review performed; medication  list updated in electronic medical record  @RXCPHYPERTENSION @ @RXCPHYPERLIPIDEMIA @  Patient Goals/Self-Care Activities . Over the next 7 days, patient will:  - check blood pressure Daily, document, and provide at future  appointments  Follow Up Plan: The care management team will reach out to the patient again over the next 7 days.     Task: Mutually Develop and Royce Macadamia Achievement of Patient Goals   Note:   Care Management Activities:    - verbalization of feelings encouraged    Notes:

## 2020-03-07 MED FILL — ATORVASTATIN CALCIUM 80 MG: 80 | 90 days supply | Qty: 90 | Fill #0

## 2020-03-07 MED FILL — CETIRIZINE HCL 10 MG TABLET: 10 | 30 days supply | Qty: 30 | Fill #0

## 2020-03-07 MED FILL — DULoxetine HCL 60 MG CPEP: 60 | 90 days supply | Qty: 90 | Fill #0

## 2020-03-11 ENCOUNTER — Other Ambulatory Visit: Payer: Self-pay

## 2020-03-18 ENCOUNTER — Other Ambulatory Visit: Payer: Self-pay

## 2020-03-18 MED FILL — ATORVASTATIN CALCIUM 80 MG: 80 | 90 days supply | Qty: 90 | Fill #0

## 2020-03-18 MED FILL — DULoxetine HCL 60 MG CPEP: 60 | 90 days supply | Qty: 90 | Fill #0

## 2020-03-18 MED FILL — CETIRIZINE HCL 10 MG TABLET: 10 | 30 days supply | Qty: 30 | Fill #0

## 2020-03-21 ENCOUNTER — Other Ambulatory Visit: Payer: Self-pay | Admitting: Nurse Practitioner

## 2020-03-21 DIAGNOSIS — J45909 Unspecified asthma, uncomplicated: Secondary | ICD-10-CM

## 2020-03-21 MED FILL — ALBUTEROL 0.083% INHAL SOLN: (2.5 MG/3ML | 12 days supply | Qty: 150 | Fill #2

## 2020-03-21 MED FILL — PROAIR HFA 90 MCG INHALER: 108 (90 BAS | 25 days supply | Qty: 9 | Fill #0

## 2020-03-23 ENCOUNTER — Other Ambulatory Visit: Payer: Self-pay

## 2020-03-23 ENCOUNTER — Ambulatory Visit (INDEPENDENT_AMBULATORY_CARE_PROVIDER_SITE_OTHER): Payer: Medicaid Other | Admitting: Primary Care

## 2020-03-23 ENCOUNTER — Encounter: Payer: Self-pay | Admitting: Primary Care

## 2020-03-23 ENCOUNTER — Other Ambulatory Visit: Payer: Self-pay | Admitting: Primary Care

## 2020-03-23 ENCOUNTER — Ambulatory Visit (INDEPENDENT_AMBULATORY_CARE_PROVIDER_SITE_OTHER): Payer: Medicaid Other

## 2020-03-23 VITALS — BP 112/80 | HR 94 | Temp 97.1°F | Ht 64.0 in | Wt 248.0 lb

## 2020-03-23 DIAGNOSIS — J449 Chronic obstructive pulmonary disease, unspecified: Secondary | ICD-10-CM

## 2020-03-23 DIAGNOSIS — R0602 Shortness of breath: Secondary | ICD-10-CM | POA: Diagnosis not present

## 2020-03-23 DIAGNOSIS — R918 Other nonspecific abnormal finding of lung field: Secondary | ICD-10-CM | POA: Diagnosis not present

## 2020-03-23 DIAGNOSIS — Z87891 Personal history of nicotine dependence: Secondary | ICD-10-CM | POA: Diagnosis not present

## 2020-03-23 DIAGNOSIS — N63 Unspecified lump in unspecified breast: Secondary | ICD-10-CM | POA: Diagnosis not present

## 2020-03-23 DIAGNOSIS — K219 Gastro-esophageal reflux disease without esophagitis: Secondary | ICD-10-CM | POA: Diagnosis not present

## 2020-03-23 DIAGNOSIS — J4489 Other specified chronic obstructive pulmonary disease: Secondary | ICD-10-CM

## 2020-03-23 DIAGNOSIS — D869 Sarcoidosis, unspecified: Secondary | ICD-10-CM

## 2020-03-23 MED ORDER — PREDNISONE 10 MG PO TABS: ORAL_TABLET | ORAL | 0 refills | Status: DC

## 2020-03-23 MED ORDER — FAMOTIDINE 20 MG PO TABS: 20.0000 mg | ORAL_TABLET | Freq: Every day | ORAL | 3 refills | Status: DC

## 2020-03-23 MED FILL — predniSONE 10 MG TABS: 10 | 12 days supply | Qty: 30 | Fill #0

## 2020-03-23 NOTE — Patient Instructions (Addendum)
   Recommendations:  - Continue Advair 500 one puff twice daily - Use albuterol rescue inhaler every 6 hurs as needed for breakthrough shortness of breath wheezing - Continue Omeprazole once daily in the morning  - Start Famotidine (pepcid) 20mg  at bedtime for cough/ GERD - Due for right diagnostic mammography and possible ultrasound in 6 months (which would be April 2022)  Orders: - CXR today re: shortness of breath  Rx: - Prednisone taper as directed - Famotidine (pepcid) 20mg  at bedtime   Follow-up: - 3 months with [name] or Erin Fulling (new patient- former Carlis Abbott)

## 2020-03-23 NOTE — Progress Notes (Signed)
@Patient  ID: Teresa Mendez, female    DOB: 11/16/1975, 45 y.o.   MRN: [id number]  Chief Complaint  Patient presents with  . Follow-up    SOB started a week ago. Using Proair but still wheezing. Albuterol is working well. 3L ATC. [facility]    Referring provider: [name], NP  HPI: 45 year old female, former smoker quit in 2020. PMH significant for COPD, sarcoidosis (confirmed by bx 2009), chronic hypoxic respiratory failure. Former patient of Dr. Carlis Abbott, last seen in office in August 2021. Maintained on Advair 500, montelukast, Zytec and PPI. Continues to use 3L supplemental oxygen. She has not seen an ophthalmologist recently.  No recent treatment for sarcoidosis.  Previous [facility] pulmonary encounter: OV 07/07/19: 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 She was diagnosed with asthma in her 4s but was recently hospitalized for the first time in April 2021. She was formerly a patient of Dr. [name], who had not been seen in several years prior to being hospitalized in April 2021 for acute respiratory failure due to an acute asthma exacerbation. She has been on Symbicort BID but continues to have a chronic dry cough, which worsens when supine. She has had clear mucus production and ongoing SOB, but denies wheezing. She has been coughing to the point of having incontinence. Her activity is significantly limited due to her breathing. Overall she does not feel that her asthma is well controlled. She had chest pain last week after her PFTs and as seen for an urgent visit. She describes a "stinging" feeling in her chest occasionally. Overall her heartburn has improved with dietary modifications and eating less spicy foods. She continues to have heartburn, but is unsure how many days per week. She has not been taking medications for this. She has sinus allergy symptoms, with rhinorrhea, congestion, sneezing,  and itchy throat. She is currently taking claritin for this.  She has continued using her 3L O2 since discharge from the hospital in April, but was not previously on O2.  Shoulder, knee, and wrist pain bilaterally for a few weeks. No erythema or warmth.  Almost out of iron pills.  Thinks amlodipine is giving her headaches and wants to try something else for her HTN. Still taking HCTZ and lisinopril at max dose. Next PCP appt on 6/16.  03/23/2020 - interim hx  Patient presents today for overdue follow-up COPD/sarcoidosis. No recent exacerbations requiring antibiotics/steriods. She was last seen in ED back in October 2021 for COPD exacerbation. She reports that her breathing has been acutely worse over the last week, feels this is due to weather/cold air. She has slight cough which is worse at night. Intermittent chest tightness which does not last. She is complaint with Advair 500-31mcg 1 puff twice daily. Continues to use 3L oxygen despite recommending she try and decrease liter flow. She has been taking montelukast and omeprazole as prescribed. She just started back on Zyrtec. She lost her mother in January, cymbalta was increased by PCP.   CT chest in September 2021 showed interval clearing previously seen bilateral patchy pulmonary parenchymal ground glass, indicative of infectious/inflammatory etiology. Scattered pulmonary parenchymal scarring similar-slightly more organized than May 2009. Emphysema. Right breast nodule. She had a right breast ultrasound guided bx in October 2021. Pathology revealed fibroadenoma right breast. Need right diagnostic mammography and possible ultrasound in 6 months (April 2022).     No Known Allergies  Immunization History  Administered Date(s) Administered  . Influenza,inj,Quad PF,6+ Mos 10/13/2019  . Moderna Sars-Covid-2 Vaccination 12/08/2019    Past Medical History:  Diagnosis Date  . Asthma   . Hypertension   . Sarcoidosis   . Sickle cell trait ([place])      Tobacco History: Social History   Tobacco Use  Smoking Status Former Smoker  . Packs/day: 0.50  . Types: Cigarettes  . Quit date: 01/03/2019  . Years since quitting: 1.2  Smokeless Tobacco Never Used   Counseling given: Not Answered   Outpatient Medications Prior to Visit  Medication Sig Dispense Refill  . albuterol (PROVENTIL) (2.5 MG/3ML) 0.083% nebulizer solution Take 3 mLs (2.5 mg total) by nebulization every 6 (six) hours as needed for wheezing or shortness of breath. 150 mL 11  . aspirin EC 81 MG tablet Take 81 mg by mouth daily.    Marland Kitchen atorvastatin (LIPITOR) 80 MG tablet Take 1 tablet (80 mg total) by mouth daily. 90 tablet 0  . cetirizine (ZYRTEC ALLERGY) 10 MG tablet Take 1 tablet (10 mg total) by mouth daily. 30 tablet 11  . DULoxetine (CYMBALTA) 60 MG capsule Take 1 capsule (60 mg total) by mouth daily. 90 capsule 0  . fluticasone (FLONASE) 50 MCG/ACT nasal spray Place 2 sprays into both nostrils daily. 16 g 6  . Fluticasone-Salmeterol (ADVAIR DISKUS) 500-50 MCG/DOSE AEPB Inhale 1 puff into the lungs in the morning and at bedtime. 60 each 11  . lisinopril (ZESTRIL) 40 MG tablet TAKE 1 TABLET (40 MG TOTAL) BY MOUTH DAILY. 60 tablet 2  . Masks MISC Nebulizer face mask J45.909 1 each 0  . montelukast (SINGULAIR) 10 MG tablet Take 1 tablet (10 mg total) by mouth at bedtime. 30 tablet 11  . OXYGEN Inhale into the lungs. 3 liters    . PROAIR HFA 108 (90 Base) MCG/ACT inhaler INHALE 1-2 PUFFS INTO THE LUNGS EVERY 6 (SIX) HOURS AS NEEDED FOR WHEEZING OR SHORTNESS OF BREATH (COUGH). 8.5 g 0  . amLODipine (NORVASC) 10 MG tablet Take 1 tablet (10 mg total) by mouth daily. (Patient taking differently: Take 10 mg by mouth daily. Taking) 90 tablet 1  . carvedilol (COREG) 3.125 MG tablet Take 1 tablet (3.125 mg total) by mouth 2 (two) times daily with a meal. (Patient taking differently: Take 3.125 mg by mouth 2 (two) times daily with a meal. taking) 180 tablet 1  . ferrous sulfate 325  (65 FE) MG tablet Take 1 tablet (325 mg total) by mouth daily with breakfast. (Patient taking differently: Take 325 mg by mouth daily with breakfast. taking) 30 tablet 2  . hydrochlorothiazide (HYDRODIURIL) 25 MG tablet Take 1 tablet (25 mg total) by mouth daily. (Patient taking differently: Take 25 mg by mouth daily. taking) 90 tablet 2  . omeprazole (PRILOSEC) 20 MG capsule Take 1 capsule (20 mg total) by mouth daily. (Patient taking differently: Take 20 mg by mouth daily. taking) 90 capsule 1  . potassium chloride 20 MEQ TBCR Take 20 mEq by mouth daily for 7 days. 7 tablet 0   No facility-administered medications prior to visit.   Review of Systems  Review of Systems  Constitutional: Negative.   HENT: Negative.   Respiratory: Positive for cough, chest tightness and shortness of breath.   Cardiovascular: Negative.    Physical Exam  BP 112/80   Pulse 94   Temp (!) 97.1 F (36.2 C)   Ht 5\' 4"  (1.626 m)   Wt 248 lb (112.5 kg)  SpO2 99%   BMI 42.57 kg/m  Physical Exam Constitutional:      Appearance: Normal appearance.  HENT:     Head: Normocephalic and atraumatic.     Mouth/Throat:     Comments: Deferred d/t masking Cardiovascular:     Rate and Rhythm: Normal rate and regular rhythm.  Pulmonary:     Breath sounds: No wheezing, rhonchi or rales.     Comments: CTA, poor effort Skin:    General: Skin is warm and dry.  Neurological:     General: No focal deficit present.     Mental Status: She is alert and oriented to person, place, and time. Mental status is at baseline.  Psychiatric:        Mood and Affect: Mood normal.        Behavior: Behavior normal.        Thought Content: Thought content normal.        Judgment: Judgment normal.      Lab Results:  CBC    Component Value Date/Time   WBC 11.2 (H) 11/27/2019 0117   RBC 4.08 11/27/2019 0117   HGB 10.4 (L) 11/27/2019 0117   HGB 10.4 (L) 11/06/2019 1519   HCT 33.6 (L) 11/27/2019 0117   HCT 32.4 (L) 11/06/2019  1519   PLT 312 11/27/2019 0117   PLT 333 11/06/2019 1519   MCV 82.4 11/27/2019 0117   MCV 80 11/06/2019 1519   MCH 25.5 (L) 11/27/2019 0117   MCHC 31.0 11/27/2019 0117   RDW 13.1 11/27/2019 0117   RDW 12.9 11/06/2019 1519   LYMPHSABS 2,930 07/03/2019 1647   LYMPHSABS 2.3 06/03/2019 1441   MONOABS 0.4 05/22/2019 0702   EOSABS 168 07/03/2019 1647   EOSABS 0.3 06/03/2019 1441   BASOSABS 30 07/03/2019 1647   BASOSABS 0.0 06/03/2019 1441    BMET    Component Value Date/Time   NA 137 11/27/2019 0117   NA 141 11/06/2019 1519   K 3.3 (L) 11/27/2019 0117   CL 97 (L) 11/27/2019 0117   CO2 32 11/27/2019 0117   GLUCOSE 124 (H) 11/27/2019 0117   BUN 6 11/27/2019 0117   BUN 12 11/06/2019 1519   CREATININE 0.82 11/27/2019 0117   CREATININE 0.85 07/03/2019 1647   CALCIUM 8.9 11/27/2019 0117   GFRNONAA >60 11/27/2019 0117   GFRAA 96 11/06/2019 1519    BNP    Component Value Date/Time   BNP 136.5 (H) 11/27/2019 0118   BNP 10 07/03/2019 1647    ProBNP No results found for: PROBNP  Imaging: DG Chest 2 View  Result Date: 03/23/2020 CLINICAL DATA:  Shortness of breath. EXAM: CHEST - 2 VIEW COMPARISON:  November 27, 2019 FINDINGS: Bullous changes are noted in the right upper lobe, stable. There is mild associated scarring in the right upper lobe. The lungs elsewhere are clear. Heart size and pulmonary vascularity within normal limits. No adenopathy. No bone lesions. 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 No edema or airspace opacity. Cardiac silhouette within normal limits. Electronically Signed   By: Lowella Grip III M.D.   On: 03/23/2020 15:42     Assessment & Plan:   Asthma-COPD overlap syndrome (HCC) - Increased shortness of breath x 1 week d/t weather change. Nocturnal cough with intermittent wheezing. She is compliant with Advair 500-30mcg. Sending in prednisone taper for acute exacerbation of her asthma symptoms.   Former smoker -  Former smoker,  quit 2020 - She can be referred to lung cancer screening age  48   GERD (gastroesophageal reflux disease) - Nocturnal cough likely d/t uncontrolled GERD - Continue Omeprazole once daily in the morning  - Start Famotidine (pepcid) 20mg  at bedtime   Breast nodule - She had a right breast ultrasound guided bx in October 2021. Pathology revealed fibroadenoma right breast. Need right diagnostic mammography and possible ultrasound in 6 months (April 2022).  Ground glass opacity present on imaging of lung - CT chest in September 2021 showed interval clearing previously seen bilateral patchy pulmonary parenchymal ground glass, indicative of infectious/inflammatory etiology  PULMONARY SARCOIDOSIS - No active signs of sarcoidosis flare  3 months with [name] or Erin Fulling (new patient- former Carlis Abbott)  [name], NP 03/24/2020

## 2020-03-24 ENCOUNTER — Encounter: Payer: Self-pay | Admitting: Primary Care

## 2020-03-24 ENCOUNTER — Other Ambulatory Visit: Payer: Self-pay

## 2020-03-24 DIAGNOSIS — N63 Unspecified lump in unspecified breast: Secondary | ICD-10-CM | POA: Insufficient documentation

## 2020-03-24 DIAGNOSIS — Z87891 Personal history of nicotine dependence: Secondary | ICD-10-CM | POA: Insufficient documentation

## 2020-03-24 NOTE — Assessment & Plan Note (Signed)
-    Former smoker, quit 2020 - She can be referred to lung cancer screening age 45

## 2020-03-24 NOTE — Progress Notes (Signed)
Please let patient know CXR showed no acute cardiopulmonary process. No pneumonia. It showed bullous emphysema and scarring right lung, both of which are stable.

## 2020-03-24 NOTE — Assessment & Plan Note (Signed)
-   CT chest in September 2021 showed interval clearing previously seen bilateral patchy pulmonary parenchymal ground glass, indicative of infectious/inflammatory etiology

## 2020-03-24 NOTE — Patient Outreach (Signed)
Medicaid Managed Care    Pharmacy Note  03/24/2020 Name: Teresa Mendez MRN: [id number] DOB: 1975-05-02  Teresa Mendez is a 45 y.o. year old female who is a primary care patient of Teresa Pounds, NP. The [facility] Managed Care Coordination team was consulted for assistance with disease management and care coordination needs.    Engaged with patient Engaged with patient by telephone for follow up visit in response to referral for case management and/or care coordination services.  [name] was given information about Managed Medicaid Care Coordination team services today. [name] agreed to services and verbal consent obtained.   Objective:  Lab Results  Component Value Date   CREATININE 0.82 11/27/2019   CREATININE 0.84 11/06/2019   CREATININE 0.85 07/03/2019    Lab Results  Component Value Date   HGBA1C 5.2 11/06/2019       Component Value Date/Time   CHOL 201 (H) 11/06/2019 1519   TRIG 113 11/06/2019 1519   HDL 47 11/06/2019 1519   CHOLHDL 4.3 11/06/2019 1519   LDLCALC 134 (H) 11/06/2019 1519    Other: (TSH, CBC, Vit D, etc.)  Clinical ASCVD: No  The 10-year ASCVD risk score [name], et al., 2013) is: 1.8%   Values used to calculate the score:     Age: 21 years     Sex: Female     Is Non-Hispanic African American: Yes     Diabetic: No     Tobacco smoker: No     Systolic Blood Pressure: 409 mmHg     Is BP treated: Yes     HDL Cholesterol: 47 mg/dL     Total Cholesterol: 201 mg/dL    Other: (CHADS2VASc if Afib, PHQ9 if depression, MMRC or CAT for COPD, ACT, DEXA)  BP Readings from Last 3 Encounters:  03/23/20 112/80  11/27/19 (!) 155/92  11/06/19 (!) 151/98    Assessment/Interventions: Review of patient past medical history, allergies, medications, health status, including review of consultants reports, laboratory and other test data, was performed as part of comprehensive evaluation and provision of chronic care management services.     HTN Home BP: 143/90 but this was months ago -Carvedilol 3.125 -Amlodipine 10mg  -HCTZ 25mg  Lisinopril 40mg  Plan: Check BP daily for 1 week. F/U and will potentially adjust therapy after 1 week of consistent data 03/24/20: 129/81, 132/89, 149/85, 129/90, BP at goal, maintain therapy   Lipids Lab Results  Component Value Date   CHOL 201 (H) 11/06/2019   CHOL 146 08/22/2016   Lab Results  Component Value Date   HDL 47 11/06/2019   HDL 47 08/22/2016   Lab Results  Component Value Date   LDLCALC 134 (H) 11/06/2019   LDLCALC 83 08/22/2016   Lab Results  Component Value Date   TRIG 113 11/06/2019   TRIG 78 08/22/2016   Lab Results  Component Value Date   CHOLHDL 4.3 11/06/2019   CHOLHDL 3.1 08/22/2016   No results found for: LDLDIRECT -Atorvastatin 20mg  Plan: LDL is too high, will ask PCP for higher dose statin   Pain -Medication isn't helping pain at all. Had Dr. Hilaria Ota with Ortho in Jan but she showed up late and they wouldn't see her Duloxetine 30mg  Plan: Ask PCP to try 60mg  though this probably won't help with pain, this should help with her mother passing away 03/24/20: Patient picked up Duloxetine 60mg , will f/u 1 month to see if improved  GERD OTC Prilosec -states no Hx of ulcers/bleeding Plan:  Taper down   Allergies Monteleukast Cetirizine Plan: PCP only refilled Monteleukast, will ask about Cetirizine  SDOH (Social Determinants of Health) assessments and interventions performed:    Care Plan  No Known Allergies  Medications Reviewed Today    Reviewed by Birder Robson, RN (Registered Nurse) on 03/23/20 at 1443  Med List Status: <None>  Medication Order Taking? Sig Documenting Provider Last Dose Status Informant  albuterol (PROVENTIL) (2.5 MG/3ML) 0.083% nebulizer solution [id number] Yes Take 3 mLs (2.5 mg total) by nebulization every 6 (six) hours as needed for wheezing or shortness of breath. [name], DO Taking Active   amLODipine  (NORVASC) 10 MG tablet [id number]  Take 1 tablet (10 mg total) by mouth daily.  Patient taking differently: Take 10 mg by mouth daily. Taking   Teresa Pounds, NP  Expired 02/04/20 2359 Self  aspirin EC 81 MG tablet [id number] Yes Take 81 mg by mouth daily. [provider] Taking Active   atorvastatin (LIPITOR) 80 MG tablet [id number] Yes Take 1 tablet (80 mg total) by mouth daily. Teresa Pounds, NP Taking Active   carvedilol (COREG) 3.125 MG tablet [id number]  Take 1 tablet (3.125 mg total) by mouth 2 (two) times daily with a meal.  Patient taking differently: Take 3.125 mg by mouth 2 (two) times daily with a meal. taking   Teresa Pounds, NP  Expired 02/04/20 2359 Self  cetirizine (ZYRTEC ALLERGY) 10 MG tablet [id number] Yes Take 1 tablet (10 mg total) by mouth daily. Teresa Pounds, NP Taking Active   DULoxetine (CYMBALTA) 60 MG capsule [id number] Yes Take 1 capsule (60 mg total) by mouth daily. Teresa Pounds, NP Taking Active   ferrous sulfate 325 (65 FE) MG tablet [id number]  Take 1 tablet (325 mg total) by mouth daily with breakfast.  Patient taking differently: Take 325 mg by mouth daily with breakfast. taking   Teresa Pounds, NP  Expired 12/06/19 2359 Self  fluticasone (FLONASE) 50 MCG/ACT nasal spray [id number] Yes Place 2 sprays into both nostrils daily. Teresa Pounds, NP Taking Active   Fluticasone-Salmeterol (ADVAIR DISKUS) 500-50 MCG/DOSE AEPB [id number] Yes Inhale 1 puff into the lungs in the morning and at bedtime. Teresa Pounds, NP Taking Active   hydrochlorothiazide (HYDRODIURIL) 25 MG tablet [id number]  Take 1 tablet (25 mg total) by mouth daily.  Patient taking differently: Take 25 mg by mouth daily. taking   Teresa Pounds, NP  Expired 02/04/20 2359 Self  lisinopril (ZESTRIL) 40 MG tablet [id number] Yes TAKE 1 TABLET (40 MG TOTAL) BY MOUTH DAILY. [name], MD Taking Active   Masks MISC [id number] Yes Nebulizer face mask J45.909 Teresa Pounds, NP  Taking Active   montelukast (SINGULAIR) 10 MG tablet [id number] Yes Take 1 tablet (10 mg total) by mouth at bedtime. [name], DO Taking Active   omeprazole (PRILOSEC) 20 MG capsule [id number]  Take 1 capsule (20 mg total) by mouth daily.  Patient taking differently: Take 20 mg by mouth daily. taking   Teresa Pounds, NP  Expired 02/04/20 2359 Self  OXYGEN [id number] Yes Inhale into the lungs. 3 liters [provider] Taking Active   potassium chloride 20 MEQ TBCR [id number]  Take 20 mEq by mouth daily for 7 days. Teresa Pounds, NP  Expired 11/14/19 2359   PROAIR HFA 108 (90 Base) MCG/ACT inhaler [id number] Yes INHALE 1-2 PUFFS INTO THE LUNGS EVERY 6 (SIX) HOURS AS NEEDED FOR WHEEZING OR SHORTNESS OF BREATH (  COUGH). Teresa Pounds, NP Taking Active           Patient Active Problem List   Diagnosis Date Noted  . Gastroesophageal reflux disease 09/07/2019  . Ground glass opacity present on imaging of lung 09/07/2019  . Interstitial pulmonary disease ([place]) 09/07/2019  . Emphysema lung ([place]) 07/03/2019  . Abnormal findings on diagnostic imaging of lung 07/03/2019  . Myalgia 07/03/2019  . Hypertensive urgency 05/18/2019  . Chest pain 05/18/2019  . Community acquired pneumonia   . Acute respiratory failure with hypoxia ([place]) 05/17/2019  . 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 Essential hypertension 06/27/2007  . DYSPNEA 06/27/2007    Conditions to be addressed/monitored: HTN, Hypertriglyceridemia and Depression  Care Plan : Medication Management  Updates made by Lane Hacker, [place] since 03/04/2020 12:00 AM    Problem: Health Promotion or Disease Self-Management (General Plan of Care)     Goal: Medication Management   Note:   Current Barriers:  . Lost mother recently and is upset about that. This comes on the heels of losing her brother and Uncle last year . Not sure what medications are and what they're for . Not taking BP  consistently  Pharmacist Clinical Goal(s):  Marland Kitchen Over the next 7 days, patient will achieve adherence to monitoring guidelines and medication adherence to achieve therapeutic efficacy through collaboration with PharmD and provider.  .   Interventions: . Inter-disciplinary care team collaboration (see longitudinal plan of care) . Comprehensive medication review performed; medication list updated in electronic medical record  @RXCPHYPERTENSION @ @RXCPHYPERLIPIDEMIA @  Patient Goals/Self-Care Activities . Over the next 7 days, patient will:  - check blood pressure Daily, document, and provide at future appointments  Follow Up Plan: The care management team will reach out to the patient again over the next 7 days.     Task: Mutually Develop and Royce Macadamia Achievement of Patient Goals   Note:   Care Management Activities:    - verbalization of feelings encouraged    Notes:       Medication Assistance: None required. Patient affirms current coverage meets needs.   Follow up: Agree/   Plan: The care management team will reach out to the patient again over the next 7 days.   [place] Constable, Pharm.D., Managed Medicaid Pharmacist - [phone]

## 2020-03-24 NOTE — Assessment & Plan Note (Signed)
-   Increased shortness of breath x 1 week d/t weather change. Nocturnal cough with intermittent wheezing. She is compliant with Advair 500-39mcg. Sending in prednisone taper for acute exacerbation of her asthma symptoms.

## 2020-03-24 NOTE — Assessment & Plan Note (Signed)
-   No active signs of sarcoidosis flare

## 2020-03-24 NOTE — Assessment & Plan Note (Signed)
-   She had a right breast ultrasound guided bx in October 2021. Pathology revealed fibroadenoma right breast. Need right diagnostic mammography and possible ultrasound in 6 months (April 2022).

## 2020-03-24 NOTE — Assessment & Plan Note (Signed)
-   Nocturnal cough likely d/t uncontrolled GERD - Continue Omeprazole once daily in the morning  - Start Famotidine (pepcid) 20mg  at bedtime

## 2020-03-25 ENCOUNTER — Ambulatory Visit: Payer: Medicaid Other | Attending: Nurse Practitioner | Admitting: Nurse Practitioner

## 2020-03-25 ENCOUNTER — Other Ambulatory Visit: Payer: Self-pay | Admitting: Nurse Practitioner

## 2020-03-25 ENCOUNTER — Other Ambulatory Visit: Payer: Self-pay

## 2020-03-25 ENCOUNTER — Encounter: Payer: Self-pay | Admitting: Nurse Practitioner

## 2020-03-25 VITALS — BP 110/74 | HR 82 | Temp 97.8°F | Ht 64.0 in | Wt 252.0 lb

## 2020-03-25 DIAGNOSIS — K5909 Other constipation: Secondary | ICD-10-CM

## 2020-03-25 DIAGNOSIS — N393 Stress incontinence (female) (male): Secondary | ICD-10-CM | POA: Diagnosis not present

## 2020-03-25 DIAGNOSIS — Z1211 Encounter for screening for malignant neoplasm of colon: Secondary | ICD-10-CM | POA: Diagnosis not present

## 2020-03-25 DIAGNOSIS — I10 Essential (primary) hypertension: Secondary | ICD-10-CM | POA: Diagnosis not present

## 2020-03-25 DIAGNOSIS — R768 Other specified abnormal immunological findings in serum: Secondary | ICD-10-CM

## 2020-03-25 DIAGNOSIS — D649 Anemia, unspecified: Secondary | ICD-10-CM | POA: Diagnosis not present

## 2020-03-25 MED ORDER — SENNOSIDES-DOCUSATE SODIUM 8.6-50 MG PO TABS: 2.0000 | ORAL_TABLET | Freq: Every day | ORAL | 2 refills | Status: AC

## 2020-03-25 NOTE — Progress Notes (Signed)
Assessment & Plan:  Teresa Mendez was seen today for follow-up.  Diagnoses and all orders for this visit:  Essential hypertension -     CMP14+EGFR Continue all antihypertensives as prescribed.  Remember to bring in your blood pressure log with you for your follow up appointment.  DASH/Mediterranean Diets are healthier choices for HTN.    Stress incontinence of urine -     Urinalysis, Complete  Elevated antinuclear antibody (ANA) level -     Ambulatory referral to Rheumatology  Anemia, unspecified type -     CBC -     Iron, TIBC and Ferritin Panel  Colon cancer screening -     Fecal occult blood, imunochemical([facility])  Chronic constipation -     senna-docusate (SENOKOT-S) 8.6-50 MG tablet; Take 2 tablets by mouth daily.    Patient has been counseled on age-appropriate routine health concerns for screening and prevention. These are reviewed and up-to-date. Referrals have been placed accordingly. Immunizations are up-to-date or declined.    Subjective:   Chief Complaint  Patient presents with  . Follow-up    Patient is here for a follow up on hypertension.    HPI Teresa Mendez 45 y.o. female presents to office today for follow up.  She has a past medical history of Asthma, Hypertension, Sarcoidosis, and Sickle cell trait ([place]).  She is on chronic O2 3L. Currently taking 12 day course of prednisone.   She was referred to rheumatology 07-10-2019 with an ANA titer 1:80 and possible connective tissue disorder. She had a visit with the rheumatologist in [facility] and states she does not want to go back. She got lost trying to find the office despite call the office and letting them know she was on the way and when she arrived was told she had to reschedule and was escorted out by security. Sates she was told that was their standard practice due to the location of the office.   Urinary Incontinence Patient complains of urinary incontinence. This has been present for  several years. She leaks urine with bending, coughing, lifting, standing, walking, sneezing, with urge, with a full bladder, during the night. Patient describes the symptoms as  urge to urinate with little or no warning, urine leakage with coughing/heavy physical activity and urine leaking unpredictably.  Factors associated with symptoms include mulitparous, Morbid obesity, diuretic. Evaluation to date includes UA/CS: normal. Treatment to date includes none.   Essential Hypertension Well controlled. SBP at home 130s. Denies chest pain, shortness of breath, palpitations, lightheadedness, dizziness, headaches or BLE edema. She is currently prescribedHCTZ 25 mg daily, lisinopril 40 mg daily, carvedilol  3.160m BID and amlodipine 10 mg daily. BP Readings from Last 3 Encounters:  03/25/20 110/74  03/23/20 112/80  11/27/19 (!) 155/92     Review of Systems  Constitutional: Negative for fever, malaise/fatigue and weight loss.  HENT: Negative.  Negative for nosebleeds.   Eyes: Negative.  Negative for blurred vision, double vision and photophobia.  Respiratory: Positive for shortness of breath (chronic ). Negative for cough and wheezing.   Cardiovascular: Negative.  Negative for chest pain, palpitations and leg swelling.  Gastrointestinal: Positive for constipation. Negative for abdominal pain, blood in stool, diarrhea, heartburn, melena, nausea and vomiting.  Genitourinary: Negative for dysuria, flank pain and hematuria.       SEE HPI  Musculoskeletal: Negative.  Negative for myalgias.  Neurological: Negative.  Negative for dizziness, focal weakness, seizures and headaches.  Psychiatric/Behavioral: Negative.  Negative for suicidal ideas.  Past Medical History:  Diagnosis Date  . Asthma   . Hypertension   . Sarcoidosis   . Sickle cell trait [facility])     Past Surgical History:  Procedure Laterality Date  . TUBAL LIGATION      Family History  Problem Relation Age of Onset  . Hypertension  Mother   . Congestive Heart Failure Mother   . Kidney failure Mother   . Sickle cell trait Mother   . Hypertension Father   . Congestive Heart Failure Father   . Kidney failure Father   . Glaucoma Father     Social History Reviewed with no changes to be made today.   Outpatient Medications Prior to Visit  Medication Sig Dispense Refill  . albuterol (PROVENTIL) (2.5 MG/3ML) 0.083% nebulizer solution Take 3 mLs (2.5 mg total) by nebulization every 6 (six) hours as needed for wheezing or shortness of breath. 150 mL 11  . aspirin EC 81 MG tablet Take 81 mg by mouth daily.    Marland Kitchen atorvastatin (LIPITOR) 80 MG tablet Take 1 tablet (80 mg total) by mouth daily. 90 tablet 0  . cetirizine (ZYRTEC ALLERGY) 10 MG tablet Take 1 tablet (10 mg total) by mouth daily. 30 tablet 11  . DULoxetine (CYMBALTA) 60 MG capsule Take 1 capsule (60 mg total) by mouth daily. 90 capsule 0  . famotidine (PEPCID) 20 MG tablet Take 1 tablet (20 mg total) by mouth at bedtime. 30 tablet 3  . fluticasone (FLONASE) 50 MCG/ACT nasal spray Place 2 sprays into both nostrils daily. 16 g 6  . Fluticasone-Salmeterol (ADVAIR DISKUS) 500-50 MCG/DOSE AEPB Inhale 1 puff into the lungs in the morning and at bedtime. 60 each 11  . lisinopril (ZESTRIL) 40 MG tablet TAKE 1 TABLET (40 MG TOTAL) BY MOUTH DAILY. 60 tablet 2  . Masks MISC Nebulizer face mask J45.909 1 each 0  . montelukast (SINGULAIR) 10 MG tablet Take 1 tablet (10 mg total) by mouth at bedtime. 30 tablet 11  . OXYGEN Inhale into the lungs. 3 liters    . predniSONE (DELTASONE) 10 MG tablet Take 4 tabs po daily x 3 days; then 3 tabs daily x3 days; then 2 tabs daily x3 days; then 1 tab daily x 3 days; then stop 30 tablet 0  . PROAIR HFA 108 (90 Base) MCG/ACT inhaler INHALE 1-2 PUFFS INTO THE LUNGS EVERY 6 (SIX) HOURS AS NEEDED FOR WHEEZING OR SHORTNESS OF BREATH (COUGH). 8.5 g 0  . amLODipine (NORVASC) 10 MG tablet Take 1 tablet (10 mg total) by mouth daily. (Patient taking  differently: Take 10 mg by mouth daily. Taking) 90 tablet 1  . carvedilol (COREG) 3.125 MG tablet Take 1 tablet (3.125 mg total) by mouth 2 (two) times daily with a meal. (Patient taking differently: Take 3.125 mg by mouth 2 (two) times daily with a meal. taking) 180 tablet 1  . ferrous sulfate 325 (65 FE) MG tablet Take 1 tablet (325 mg total) by mouth daily with breakfast. (Patient taking differently: Take 325 mg by mouth daily with breakfast. taking) 30 tablet 2  . hydrochlorothiazide (HYDRODIURIL) 25 MG tablet Take 1 tablet (25 mg total) by mouth daily. (Patient taking differently: Take 25 mg by mouth daily. taking) 90 tablet 2  . omeprazole (PRILOSEC) 20 MG capsule Take 1 capsule (20 mg total) by mouth daily. (Patient taking differently: Take 20 mg by mouth daily. taking) 90 capsule 1  . potassium chloride 20 MEQ TBCR Take 20 mEq by mouth daily  for 7 days. (Patient not taking: Reported on 03/25/2020) 7 tablet 0   No facility-administered medications prior to visit.    No Known Allergies     Objective:    BP 110/74 (BP Location: Left Arm, Patient Position: Sitting, Cuff Size: Large)   Pulse 82   Temp 97.8 F (36.6 C) (Oral)   Ht 5' 4"  (1.626 m)   Wt 252 lb (114.3 kg)   LMP 02/15/2020 (Exact Date)   SpO2 96%   BMI 43.26 kg/m  Wt Readings from Last 3 Encounters:  03/25/20 252 lb (114.3 kg)  03/23/20 248 lb (112.5 kg)  11/27/19 253 lb 8.5 oz (115 kg)    Physical Exam Vitals and nursing note reviewed.  Constitutional:      Appearance: She is well-developed and well-nourished.  HENT:     Head: Normocephalic and atraumatic.  Eyes:     Extraocular Movements: EOM normal.  Cardiovascular:     Rate and Rhythm: Normal rate and regular rhythm.     Pulses: Intact distal pulses.     Heart sounds: Normal heart sounds. No murmur heard. No friction rub. No gallop.   Pulmonary:     Effort: Pulmonary effort is normal. No tachypnea or respiratory distress.     Breath sounds: Normal  breath sounds. No decreased breath sounds, wheezing, rhonchi or rales.  Chest:     Chest wall: No tenderness.  Abdominal:     General: Bowel sounds are normal.     Palpations: Abdomen is soft.  Musculoskeletal:        General: No edema. Normal range of motion.     Cervical back: Normal range of motion.  Skin:    General: Skin is warm and dry.  Neurological:     Mental Status: She is alert and oriented to person, place, and time.     Coordination: Coordination normal.  Psychiatric:        Mood and Affect: Mood and affect normal.        Behavior: Behavior normal. Behavior is cooperative.        Thought Content: Thought content normal.        Judgment: Judgment normal.          Patient has been counseled extensively about nutrition and exercise as well as the importance of adherence with medications and regular follow-up. The patient was given clear instructions to go to ER or return to medical center if symptoms don't improve, worsen or new problems develop. The patient verbalized understanding.   Follow-up: Return in about 3 months (around 06/22/2020).   [name], FNP-BC [facility] [place], [place]   03/25/2020, 4:06 PM

## 2020-03-26 LAB — URINALYSIS, COMPLETE
Bilirubin, UA: NEGATIVE
Glucose, UA: NEGATIVE
Ketones, UA: NEGATIVE
Nitrite, UA: NEGATIVE
RBC, UA: NEGATIVE
Specific Gravity, UA: 1.016 (ref 1.005–1.030)
Urobilinogen, Ur: 1 mg/dL (ref 0.2–1.0)
pH, UA: 6.5 (ref 5.0–7.5)

## 2020-03-26 LAB — IRON,TIBC AND FERRITIN PANEL
Ferritin: 31 ng/mL (ref 15–150)
Iron Saturation: 25 % (ref 15–55)
Iron: 94 ug/dL (ref 27–159)
Total Iron Binding Capacity: 382 ug/dL (ref 250–450)
UIBC: 288 ug/dL (ref 131–425)

## 2020-03-26 LAB — MICROSCOPIC EXAMINATION
Casts: NONE SEEN /lpf
Epithelial Cells (non renal): >10 /hpf — AB (ref 0–10)
WBC, UA: >30 /hpf — AB (ref 0–5)

## 2020-03-26 LAB — CMP14+EGFR
ALT: 12 IU/L (ref 0–32)
AST: 16 IU/L (ref 0–40)
Albumin/Globulin Ratio: 1.5 (ref 1.2–2.2)
Albumin: 4.2 g/dL (ref 3.8–4.8)
Alkaline Phosphatase: 98 IU/L (ref 44–121)
BUN/Creatinine Ratio: 13 (ref 9–23)
BUN: 9 mg/dL (ref 6–24)
Bilirubin Total: 0.3 mg/dL (ref 0.0–1.2)
CO2: 31 mmol/L — ABNORMAL HIGH (ref 20–29)
Calcium: 9 mg/dL (ref 8.7–10.2)
Chloride: 92 mmol/L — ABNORMAL LOW (ref 96–106)
Creatinine, Ser: 0.68 mg/dL (ref 0.57–1.00)
GFR calc Af Amer: 121 mL/min/{1.73_m2} (ref >59)
GFR calc non Af Amer: 105 mL/min/{1.73_m2} (ref >59)
Globulin, Total: 2.8 g/dL (ref 1.5–4.5)
Glucose: 100 mg/dL — ABNORMAL HIGH (ref 65–99)
Potassium: 3.2 mmol/L — ABNORMAL LOW (ref 3.5–5.2)
Sodium: 137 mmol/L (ref 134–144)
Total Protein: 7 g/dL (ref 6.0–8.5)

## 2020-03-26 LAB — CBC
Hematocrit: 36.6 % (ref 34.0–46.6)
Hemoglobin: 11.5 g/dL (ref 11.1–15.9)
MCH: 24.6 pg — ABNORMAL LOW (ref 26.6–33.0)
MCHC: 31.4 g/dL — ABNORMAL LOW (ref 31.5–35.7)
MCV: 78 fL — ABNORMAL LOW (ref 79–97)
Platelets: 362 10*3/uL (ref 150–450)
RBC: 4.68 x10E6/uL (ref 3.77–5.28)
RDW: 14 % (ref 11.7–15.4)
WBC: 9.6 10*3/uL (ref 3.4–10.8)

## 2020-03-27 ENCOUNTER — Other Ambulatory Visit: Payer: Self-pay | Admitting: Nurse Practitioner

## 2020-03-27 DIAGNOSIS — R82998 Other abnormal findings in urine: Secondary | ICD-10-CM

## 2020-03-27 DIAGNOSIS — Z114 Encounter for screening for human immunodeficiency virus [HIV]: Secondary | ICD-10-CM

## 2020-03-27 MED ORDER — POTASSIUM CHLORIDE CRYS ER 20 MEQ PO TBCR: 20.0000 meq | EXTENDED_RELEASE_TABLET | Freq: Every day | ORAL | 0 refills | Status: DC

## 2020-03-27 MED ORDER — SULFAMETHOXAZOLE-TRIMETHOPRIM 800-160 MG PO TABS: 1.0000 | ORAL_TABLET | Freq: Two times a day (BID) | ORAL | 0 refills | Status: AC

## 2020-03-28 ENCOUNTER — Telehealth: Payer: Self-pay | Admitting: Nurse Practitioner

## 2020-03-28 ENCOUNTER — Other Ambulatory Visit: Payer: Self-pay | Admitting: Obstetrics and Gynecology

## 2020-03-28 MED FILL — POTASSIUM CL ER 20 MEQ TAB: 20 | 14 days supply | Qty: 14 | Fill #0

## 2020-03-28 MED FILL — SULFAMETHOXAZOLE-TMP DS TAB: 800-160 | 3 days supply | Qty: 6 | Fill #0

## 2020-03-28 NOTE — Patient Outreach (Signed)
Care Coordination  03/28/2020  Quierra Silverio Weller 01/03/1976 [id number]   Medicaid Managed Care   Unsuccessful Outreach Note  03/28/2020 Name: LENAY LOVEJOY MRN: [id number] DOB: 10/13/1975  Referred by: [name], NP Reason for referral : High Risk Managed Medicaid (Unsuccessful telephone outreach)   An unsuccessful telephone outreach was attempted today. The patient was referred to the case management team for assistance with care management and care coordination.   Follow Up Plan: A member of the Managed Medicaid  care management team will reach out to the patient again over the next 7 days.   Aida Raider RN, BSN [place]  [facility] - Managed Medicaid High Risk [phone].

## 2020-03-28 NOTE — Patient Instructions (Signed)
Hi Ms. Summerson, sorry we missed you today  - as a part of your Medicaid benefit, you are eligible for care management and care coordination services at no cost or copay. I was unable to reach you by phone today but would be happy to help you with your health related needs. Please feel free to call me at [phone].  A member of the Managed Medicaid care management team will reach out to you again over the next 7 days.   Aida Raider RN, BSN [place]  [facility] - Managed Medicaid High Risk [phone]

## 2020-03-28 NOTE — Telephone Encounter (Signed)
Copied from [place] [id number]. Topic: General - Other >> Mar 28, 2020  9:52 AM Leward Quan A wrote: Reason for CRM: June with [employer] called in to check status of paparwork faxed on 03/22/20 will fax again today. Please advise any questions please call Ph#  [phone]

## 2020-03-28 NOTE — Telephone Encounter (Signed)
Orders have been received and will be faxed once signed.

## 2020-03-29 DIAGNOSIS — D869 Sarcoidosis, unspecified: Secondary | ICD-10-CM | POA: Diagnosis not present

## 2020-03-29 DIAGNOSIS — J439 Emphysema, unspecified: Secondary | ICD-10-CM | POA: Diagnosis not present

## 2020-03-29 DIAGNOSIS — J849 Interstitial pulmonary disease, unspecified: Secondary | ICD-10-CM | POA: Diagnosis not present

## 2020-04-01 MED FILL — LISINOPRIL 40 MG TAB: 40 | 90 days supply | Qty: 90 | Fill #1

## 2020-04-04 ENCOUNTER — Other Ambulatory Visit: Payer: Self-pay | Admitting: Nurse Practitioner

## 2020-04-04 ENCOUNTER — Encounter: Payer: Self-pay | Admitting: Nurse Practitioner

## 2020-04-04 MED ORDER — ONDANSETRON 4 MG PO TBDP: ORAL_TABLET | ORAL | 1 refills | Status: DC

## 2020-04-04 MED FILL — ONDANSETRON ODT 4 MG TABLET: 4 | 13 days supply | Qty: 40 | Fill #0

## 2020-04-04 NOTE — Telephone Encounter (Signed)
Will route to CMA

## 2020-04-04 NOTE — Telephone Encounter (Signed)
June calling back to day following up on these orders.  Did you fax back?

## 2020-04-04 NOTE — Telephone Encounter (Signed)
Form has been signed through e-doc

## 2020-04-06 ENCOUNTER — Telehealth: Payer: Self-pay | Admitting: Nurse Practitioner

## 2020-04-06 DIAGNOSIS — R32 Unspecified urinary incontinence: Secondary | ICD-10-CM | POA: Diagnosis not present

## 2020-04-06 NOTE — Telephone Encounter (Signed)
Attempted to reach Teresa Mendez today to get her phone visit with the Managed Medicaid Team rescheduled. I left my contact info on her voicemail. I will reach out again in a few days if I have not heard back from her.

## 2020-04-20 ENCOUNTER — Other Ambulatory Visit: Payer: Self-pay

## 2020-04-20 NOTE — Patient Instructions (Signed)
Visit Information  Teresa Mendez was given information about Medicaid Managed Care team care coordination services as a part of their Healthy [facility] Medicaid benefit. Teresa Mendez verbally consented to engagement with the [facility] Managed Care team.   For questions related to your Healthy [facility] health plan, please call: [phone] or visit the homepage here: [email or web address]  If you would like to schedule transportation through your Healthy [facility] plan, please call the following number at least 2 days in advance of your appointment: [phone]   Call the [place] at [phone], at any time, 24 hours a day, 7 days a week. If you are in danger or need immediate medical attention call 911.  Teresa Mendez - following are the goals we discussed in your visit today:  Goals Addressed   None     Please see education materials related to BP provided as print materials.   Patient verbalizes understanding of instructions provided today.   The patient has been provided with contact information for the Managed Medicaid care management team and has been advised to call with any health related questions or concerns.   Teresa Mendez, Pharm.D., Managed Medicaid Pharmacist [phone]   Following is a copy of your plan of care:  Patient Care Plan: General Plan of Care (Adult)    Problem Identified: Health Promotion or Disease Self-Management (General Plan of Care)   Priority: Medium  Onset Date: 02/26/2020    Long-Range Goal: Self-Management Plan Developed   Start Date: 02/26/2020  Expected End Date: 05/26/2020  This Visit's Progress: On track  Priority: Medium  Note:   Current Barriers:  . Chronic Disease Management support.  Nurse Case Manager Clinical Goal(s):  Marland Kitchen Over the next 90 days, patient will attend all scheduled medical appointments . Over the next 30 days, patient will work with CM team pharmacist to  review medications.  Interventions:  . Inter-disciplinary care team collaboration (see longitudinal plan of care) . Reviewed medications with patient. Teresa Mendez with pharmacy regarding medications. . Discussed plans with patient for ongoing care management follow up and provided patient with direct contact information for care management team . Reviewed scheduled/upcoming provider appointments. . Pharmacy referral for medication review.  Patient Goals/Self-Care Activities Over the next 90 days, patient will:  -Self administers medications as prescribed Attends all scheduled provider appointments Calls pharmacy for medication refills Calls provider office for new concerns or questions  Follow Up Plan: The Managed Medicaid care management team will reach out to the patient again over the next 30 days.  The patient has been provided with contact information for the Managed Medicaid care management team and has been advised to call with any health related questions or concerns.       Task: Mutually Develop and Royce Macadamia Achievement of Patient Goals   Note:   Care Management Activities:    - verbalization of feelings encouraged    Notes:    Patient Care Plan: Medication Management    Problem Identified: Health Promotion or Disease Self-Management (General Plan of Care)     Goal: Medication Management   Note:   Current Barriers:  . Lost mother recently and is upset about that. This comes on the heels of losing her brother and Uncle last year . Not sure what medications are and what they're for . Not taking BP consistently  Pharmacist Clinical Goal(s):  Marland Kitchen Over the next 7 days, patient will achieve adherence to monitoring guidelines and medication adherence to achieve therapeutic efficacy  through collaboration with PharmD and provider.  .   Interventions: . Inter-disciplinary care team collaboration (see longitudinal plan of care) . Comprehensive medication review performed;  medication list updated in electronic medical record  @RXCPHYPERTENSION @ @RXCPHYPERLIPIDEMIA @  Patient Goals/Self-Care Activities . Over the next 7 days, patient will:  - check blood pressure Daily, document, and provide at future appointments  Follow Up Plan: The care management team will reach out to the patient again over the next 7 days.     Task: Mutually Develop and Royce Macadamia Achievement of Patient Goals   Note:   Care Management Activities:    - verbalization of feelings encouraged    Notes:

## 2020-04-20 NOTE — Patient Outreach (Signed)
Medicaid Managed Care    Pharmacy Note  04/20/2020 Name: Teresa Mendez MRN: [id number] DOB: August 31, 1975  Teresa Mendez is a 45 y.o. year old female who is a primary care patient of Teresa Pounds, NP. The [facility] Managed Care Coordination team was consulted for assistance with disease management and care coordination needs.    Engaged with patient Engaged with patient by telephone for follow up visit in response to referral for case management and/or care coordination services.  [name] was given information about Managed Medicaid Care Coordination team services today. Teresa Mendez agreed to services and verbal consent obtained.   Objective:  Lab Results  Component Value Date   CREATININE 0.68 03/25/2020   CREATININE 0.82 11/27/2019   CREATININE 0.84 11/06/2019    Lab Results  Component Value Date   HGBA1C 5.2 11/06/2019       Component Value Date/Time   CHOL 201 (H) 11/06/2019 1519   TRIG 113 11/06/2019 1519   HDL 47 11/06/2019 1519   CHOLHDL 4.3 11/06/2019 1519   LDLCALC 134 (H) 11/06/2019 1519    Other: (TSH, CBC, Vit D, etc.)  Clinical ASCVD: No  The 10-year ASCVD risk score [name], et al., 2013) is: 1.8%   Values used to calculate the score:     Age: 9 years     Sex: Female     Is Non-Hispanic African American: Yes     Diabetic: No     Tobacco smoker: No     Systolic Blood Pressure: 846 mmHg     Is BP treated: Yes     HDL Cholesterol: 47 mg/dL     Total Cholesterol: 201 mg/dL    Other: (CHADS2VASc if Afib, PHQ9 if depression, MMRC or CAT for COPD, ACT, DEXA)  BP Readings from Last 3 Encounters:  03/25/20 110/74  03/23/20 112/80  11/27/19 (!) 155/92    Assessment/Interventions: Review of patient past medical history, allergies, medications, health status, including review of consultants reports, laboratory and other test data, was performed as part of comprehensive evaluation and provision of chronic care management services.     HTN Home BP: Hasn't tested in a bit -Carvedilol 3.125 -Amlodipine 10mg  -HCTZ 25mg  Lisinopril 40mg  Feb 2022 Plan: Check BP daily for 1 week. F/U and will potentially adjust therapy after 1 week of consistent data 03/24/20: 129/81, 132/89, 149/85, 129/90, BP at goal, maintain therapy Plan: At goal,  patient stable/ symptoms controlled   Lipids Lab Results  Component Value Date   CHOL 201 (H) 11/06/2019   CHOL 146 08/22/2016   Lab Results  Component Value Date   HDL 47 11/06/2019   HDL 47 08/22/2016   Lab Results  Component Value Date   LDLCALC 134 (H) 11/06/2019   LDLCALC 83 08/22/2016   Lab Results  Component Value Date   TRIG 113 11/06/2019   TRIG 78 08/22/2016   Lab Results  Component Value Date   CHOLHDL 4.3 11/06/2019   CHOLHDL 3.1 08/22/2016   No results found for: LDLDIRECT -Atorvastatin 80mg  Plan: At goal,  patient stable/ symptoms controlled    Pain Duloxetine 60mg  Feb 2022 Plan: Ask PCP to try 60mg  though this probably won't help with pain, this should help with her mother passing away 03/24/20: Patient picked up Duloxetine 60mg , will f/u 1 month to see if improved March 2022: Patient content on therapy, at goal, maintain therapy  GERD OTC Prilosec -states no Hx of ulcers/bleeding Plan: Taper down   Allergies Monteleukast  Cetirizine Plan: At goal,  patient stable/ symptoms controlled   Having trouble sleeping: -Maybe 3-4 hours/night -Wants to try Melatonin but can't afford Plan: Try sleep hygiene. Watches TV in bed, organizes bills in bed. Told her bed should be for 1 thing only (Sleep).   SDOH (Social Determinants of Health) assessments and interventions performed:    Care Plan  No Known Allergies  Medications Reviewed Today    Reviewed by Teresa Pounds, NP (Nurse Practitioner) on 03/25/20 at [place] [facility] Status: <None>  Medication Order Taking? Sig Documenting Provider Last Dose Status Informant  albuterol (PROVENTIL) (2.5  MG/3ML) 0.083% nebulizer solution [id number] Yes Take 3 mLs (2.5 mg total) by nebulization every 6 (six) hours as needed for wheezing or shortness of breath. [name], DO Taking Active   amLODipine (NORVASC) 10 MG tablet [id number]  Take 1 tablet (10 mg total) by mouth daily.  Patient taking differently: Take 10 mg by mouth daily. Taking   Teresa Pounds, NP  Expired 02/04/20 2359 Self  aspirin EC 81 MG tablet [id number] Yes Take 81 mg by mouth daily. [provider] Taking Active   atorvastatin (LIPITOR) 80 MG tablet [id number] Yes Take 1 tablet (80 mg total) by mouth daily. Teresa Pounds, NP Taking Active   carvedilol (COREG) 3.125 MG tablet [id number]  Take 1 tablet (3.125 mg total) by mouth 2 (two) times daily with a meal.  Patient taking differently: Take 3.125 mg by mouth 2 (two) times daily with a meal. taking   Teresa Pounds, NP  Expired 02/04/20 2359 Self  cetirizine (ZYRTEC ALLERGY) 10 MG tablet [id number] Yes Take 1 tablet (10 mg total) by mouth daily. Teresa Pounds, NP Taking Active   DULoxetine (CYMBALTA) 60 MG capsule [id number] Yes Take 1 capsule (60 mg total) by mouth daily. Teresa Pounds, NP Taking Active   famotidine (PEPCID) 20 MG tablet [id number] Yes Take 1 tablet (20 mg total) by mouth at bedtime. [name], NP Taking Active   ferrous sulfate 325 (65 FE) MG tablet [id number]  Take 1 tablet (325 mg total) by mouth daily with breakfast.  Patient taking differently: Take 325 mg by mouth daily with breakfast. taking   Teresa Pounds, NP  Expired 12/06/19 2359 Self  fluticasone (FLONASE) 50 MCG/ACT nasal spray [id number] Yes Place 2 sprays into both nostrils daily. Teresa Pounds, NP Taking Active   Fluticasone-Salmeterol (ADVAIR DISKUS) 500-50 MCG/DOSE AEPB [id number] Yes Inhale 1 puff into the lungs in the morning and at bedtime. Teresa Pounds, NP Taking Active   hydrochlorothiazide (HYDRODIURIL) 25 MG tablet [id number]  Take 1 tablet (25 mg total)  by mouth daily.  Patient taking differently: Take 25 mg by mouth daily. taking   Teresa Pounds, NP  Active Self  lisinopril (ZESTRIL) 40 MG tablet [id number] Yes TAKE 1 TABLET (40 MG TOTAL) BY MOUTH DAILY. [name], MD Taking Active   Masks MISC [id number] Yes Nebulizer face mask J45.909 Teresa Pounds, NP Taking Active   montelukast (SINGULAIR) 10 MG tablet [id number] Yes Take 1 tablet (10 mg total) by mouth at bedtime. [name], DO Taking Active   omeprazole (PRILOSEC) 20 MG capsule [id number]  Take 1 capsule (20 mg total) by mouth daily.  Patient taking differently: Take 20 mg by mouth daily. taking   Teresa Pounds, NP  Expired 02/04/20 2359 Self  OXYGEN [id number] Yes Inhale into the lungs. 3 liters [provider] Taking Active  predniSONE (DELTASONE) 10 MG tablet [id number] Yes Take 4 tabs po daily x 3 days; then 3 tabs daily x3 days; then 2 tabs daily x3 days; then 1 tab daily x 3 days; then stop [name], NP Taking Active   PROAIR HFA 108 [phone] Base) MCG/ACT inhaler [id number] Yes INHALE 1-2 PUFFS INTO THE LUNGS EVERY 6 (SIX) HOURS AS NEEDED FOR WHEEZING OR SHORTNESS OF BREATH (COUGH). Teresa Pounds, NP Taking Active   senna-docusate (SENOKOT-S) 8.6-50 MG tablet [id number] Yes Take 2 tablets by mouth daily. Teresa Pounds, NP  Active           Patient Active Problem List   Diagnosis Date Noted  . Former smoker 03/24/2020  . Breast nodule 03/24/2020  . GERD (gastroesophageal reflux disease) 09/07/2019  . Ground glass opacity present on imaging of lung 09/07/2019  . Interstitial pulmonary disease ([place]) 09/07/2019  . Emphysema lung ([place]) 07/03/2019  . Abnormal findings on diagnostic imaging of lung 07/03/2019  . Myalgia 07/03/2019  . Hypertensive urgency 05/18/2019  . Chest pain 05/18/2019  . Acute respiratory failure with hypoxia ([place]) 05/17/2019  . 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 Essential  hypertension 06/27/2007  . DYSPNEA 06/27/2007    Conditions to be addressed/monitored: HTN, Hypertriglyceridemia and Depression  Care Plan : Medication Management  Updates made by Lane Hacker, [place] since 03/04/2020 12:00 AM    Problem: Health Promotion or Disease Self-Management (General Plan of Care)     Goal: Medication Management   Note:   Current Barriers:  . Lost mother recently and is upset about that. This comes on the heels of losing her brother and Uncle last year . Not sure what medications are and what they're for . Not taking BP consistently  Pharmacist Clinical Goal(s):  Marland Kitchen Over the next 7 days, patient will achieve adherence to monitoring guidelines and medication adherence to achieve therapeutic efficacy through collaboration with PharmD and provider.  .   Interventions: . Inter-disciplinary care team collaboration (see longitudinal plan of care) . Comprehensive medication review performed; medication list updated in electronic medical record  @RXCPHYPERTENSION @ @RXCPHYPERLIPIDEMIA @  Patient Goals/Self-Care Activities . Over the next 7 days, patient will:  - check blood pressure Daily, document, and provide at future appointments  Follow Up Plan: The care management team will reach out to the patient again over the next 7 days.     Task: Mutually Develop and Royce Macadamia Achievement of Patient Goals   Note:   Care Management Activities:    - verbalization of feelings encouraged    Notes:       Medication Assistance: None required. Patient affirms current coverage meets needs.   Follow up: Agree/   Plan: The care management team will reach out to the patient again over the next 7 days.   [place] Constable, Pharm.D., Managed Medicaid Pharmacist - [phone]

## 2020-04-25 DIAGNOSIS — R32 Unspecified urinary incontinence: Secondary | ICD-10-CM | POA: Diagnosis not present

## 2020-04-28 ENCOUNTER — Telehealth: Payer: Self-pay | Admitting: Nurse Practitioner

## 2020-04-28 NOTE — Telephone Encounter (Signed)
2nd attempt to reach Teresa Mendez to get her rescheduled with the [facility] on the Managed Medicaid Team. I left my name and number for to call me back.

## 2020-04-29 DIAGNOSIS — R32 Unspecified urinary incontinence: Secondary | ICD-10-CM | POA: Diagnosis not present

## 2020-04-29 DIAGNOSIS — J439 Emphysema, unspecified: Secondary | ICD-10-CM | POA: Diagnosis not present

## 2020-04-29 DIAGNOSIS — D869 Sarcoidosis, unspecified: Secondary | ICD-10-CM | POA: Diagnosis not present

## 2020-04-29 DIAGNOSIS — J849 Interstitial pulmonary disease, unspecified: Secondary | ICD-10-CM | POA: Diagnosis not present

## 2020-05-04 ENCOUNTER — Other Ambulatory Visit: Payer: Self-pay

## 2020-05-04 ENCOUNTER — Other Ambulatory Visit: Payer: Self-pay | Admitting: Nurse Practitioner

## 2020-05-04 MED FILL — Omeprazole Cap Delayed Release 20 MG: ORAL | 30 days supply | Qty: 30 | Fill #0 | Status: AC

## 2020-05-04 MED FILL — Albuterol Sulfate Soln Nebu 0.083% (2.5 MG/3ML): RESPIRATORY_TRACT | 13 days supply | Qty: 150 | Fill #0 | Status: AC

## 2020-05-04 MED FILL — Fluticasone-Salmeterol Aer Powder BA 500-50 MCG/ACT: RESPIRATORY_TRACT | 30 days supply | Qty: 60 | Fill #0 | Status: AC

## 2020-05-04 MED FILL — Montelukast Sodium Tab 10 MG (Base Equiv): ORAL | 30 days supply | Qty: 30 | Fill #0 | Status: AC

## 2020-05-04 NOTE — Telephone Encounter (Signed)
Requested medication (s) are due for refill today - unknown  Requested medication (s) are on the active medication list -yes  Future visit scheduled -no  Last refill: 03/21/20  Notes to clinic: Request RF- [facility]- no active chart for visit profile  Requested Prescriptions  Pending Prescriptions Disp Refills   albuterol (PROAIR HFA) 108 (90 Base) MCG/ACT inhaler 8.5 g 0    Sig: INHALE 1-2 PUFFS INTO THE LUNGS EVERY 6 (SIX) HOURS AS NEEDED FOR WHEEZING OR SHORTNESS OF BREATH (COUGH).      Pulmonology:  Beta Agonists Failed - 05/04/2020  2:48 PM      Failed - One inhaler should last at least one month. If the patient is requesting refills earlier, contact the patient to check for uncontrolled symptoms.      Failed - Valid encounter within last 12 months    Recent Outpatient Visits   None                atorvastatin (LIPITOR) 80 MG tablet 90 tablet 0    Sig: TAKE 1 TABLET (80 MG TOTAL) BY MOUTH DAILY.      Cardiovascular:  Antilipid - Statins Failed - 05/04/2020  2:48 PM      Failed - Total Cholesterol in normal range and within 360 days    No results found for: CHOL, POCCHOL, CHOLTOT        Failed - LDL in normal range and within 360 days    No results found for: LDLCALC, LDLC, HIRISKLDL, POCLDL, LDLDIRECT, REALLDLC, TOTLDLC        Failed - HDL in normal range and within 360 days    No results found for: HDL, POCHDL        Failed - Triglycerides in normal range and within 360 days    No results found for: TRIG, POCTRIG        Failed - Valid encounter within last 12 months    Recent Outpatient Visits   None              Passed - Patient is not pregnant          Requested Prescriptions  Pending Prescriptions Disp Refills   albuterol (PROAIR HFA) 108 (90 Base) MCG/ACT inhaler 8.5 g 0    Sig: INHALE 1-2 PUFFS INTO THE LUNGS EVERY 6 (SIX) HOURS AS NEEDED FOR WHEEZING OR SHORTNESS OF BREATH (COUGH).      Pulmonology:  Beta Agonists Failed - 05/04/2020  2:48 PM       Failed - One inhaler should last at least one month. If the patient is requesting refills earlier, contact the patient to check for uncontrolled symptoms.      Failed - Valid encounter within last 12 months    Recent Outpatient Visits   None                atorvastatin (LIPITOR) 80 MG tablet 90 tablet 0    Sig: TAKE 1 TABLET (80 MG TOTAL) BY MOUTH DAILY.      Cardiovascular:  Antilipid - Statins Failed - 05/04/2020  2:48 PM      Failed - Total Cholesterol in normal range and within 360 days    No results found for: CHOL, POCCHOL, CHOLTOT        Failed - LDL in normal range and within 360 days    No results found for: LDLCALC, LDLC, HIRISKLDL, POCLDL, LDLDIRECT, REALLDLC, TOTLDLC        Failed - HDL in normal range  and within 360 days    No results found for: HDL, POCHDL        Failed - Triglycerides in normal range and within 360 days    No results found for: TRIG, POCTRIG        Failed - Valid encounter within last 12 months    Recent Outpatient Visits   None              Passed - Patient is not pregnant

## 2020-05-05 ENCOUNTER — Other Ambulatory Visit: Payer: Self-pay

## 2020-05-06 ENCOUNTER — Other Ambulatory Visit: Payer: Self-pay

## 2020-05-10 ENCOUNTER — Other Ambulatory Visit: Payer: Self-pay

## 2020-05-10 MED FILL — Cetirizine HCl Tab 10 MG: ORAL | 30 days supply | Qty: 30 | Fill #0 | Status: AC

## 2020-05-10 MED FILL — Cetirizine HCl Tab 10 MG: ORAL | 30 days supply | Qty: 30 | Fill #0 | Status: CN

## 2020-05-11 ENCOUNTER — Other Ambulatory Visit: Payer: Self-pay

## 2020-05-16 NOTE — Progress Notes (Signed)
Office Visit Note  Patient: Teresa Mendez             Date of Birth: 14-Oct-1975           MRN: [id number]             PCP: [name], NP Referring: [name], NP Visit Date: 05/30/2020 Occupation: @GUAROCC @  Subjective:  Shortness of breath.   History of Present Illness: Teresa Mendez is a 45 y.o. female seen in the consultation per request of her PCP.  According to the patient her symptoms started with asthma and shortness of breath since she was 45 years old.  She was under care of Dr. [name] for many years.  She was diagnosed with sarcoidosis in 2009 by Dr. [name].  We treated her with prednisone and antibiotics for COPD and sarcoidosis on as needed basis.  In April 2021 she was hospitalized with acute respiratory failure in the ICU.  She states she was hospitalized for a week and then was discharged.  She was initially under care of Dr. [name] and then after he left she was followed by Geraldo Pitter.  She was seen in the emergency room in October 2021 for COPD flare.  She has been treated with inhalers and steroids.  CT scan done on September 2021 showed bilateral patchy pulmonary parenchymal groundglass appearance.  She has been on oxygen since April 2021.  She has a follow-up appointment coming up with  the pulmonologist.  There is no history of oral ulcers, nasal ulcers, sicca symptoms, malar rash, photosensitivity, Raynaud's phenomenon or lymphadenopathy.  She complains of pain and discomfort in her hands, feet, knee joints, shoulders, neck, lower back and  muscle pain.  There is no history of joint swelling.  There is no family history of autoimmune disease.  Gravida 6, para 3, miscarriages 3.  Activities of Daily Living:  Patient reports morning stiffness for 2-3  hours.   Patient Reports nocturnal pain.  Difficulty dressing/grooming: Reports Difficulty climbing stairs: Reports Difficulty getting out of chair: Reports Difficulty using hands for taps, buttons,  cutlery, and/or writing: Reports  Review of Systems  Constitutional: Positive for fatigue. Negative for night sweats, weight gain and weight loss.  HENT: Negative for mouth sores, trouble swallowing, trouble swallowing, mouth dryness and nose dryness.   Eyes: Positive for dryness. Negative for pain, redness, itching and visual disturbance.  Respiratory: Positive for cough, shortness of breath and difficulty breathing.   Cardiovascular: Negative for chest pain, palpitations, hypertension, irregular heartbeat and swelling in legs/feet.  Gastrointestinal: Negative for blood in stool, constipation and diarrhea.  Endocrine: Negative for increased urination.  Genitourinary: Negative for difficulty urinating and vaginal dryness.  Musculoskeletal: Positive for arthralgias, joint pain, myalgias, morning stiffness, muscle tenderness and myalgias. Negative for joint swelling and muscle weakness.  Skin: Negative for color change, rash, hair loss, redness, skin tightness, ulcers and sensitivity to sunlight.  Allergic/Immunologic: Negative for susceptible to infections.  Neurological: Positive for numbness and weakness. Negative for dizziness, headaches, memory loss and night sweats.  Hematological: Negative for bruising/bleeding tendency and swollen glands.  Psychiatric/Behavioral: Negative for depressed mood, confusion and sleep disturbance. The patient is nervous/anxious.     PMFS History:  Patient Active Problem List   Diagnosis Date Noted  . Former smoker 03/24/2020  . Breast nodule 03/24/2020  . GERD (gastroesophageal reflux disease) 09/07/2019  . Ground glass opacity present on imaging of lung 09/07/2019  . Interstitial pulmonary disease ([place]) 09/07/2019  .  Emphysema lung ([place]) 07/03/2019  . Abnormal findings on diagnostic imaging of lung 07/03/2019  . Myalgia 07/03/2019  . Hypertensive urgency 05/18/2019  . Chest pain 05/18/2019  . Acute respiratory failure with hypoxia ([place]) 05/17/2019  .  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 Essential hypertension 06/27/2007  . DYSPNEA 06/27/2007    Past Medical History:  Diagnosis Date  . Asthma   . Hypertension   . Sarcoidosis   . Sickle cell trait (HCC)     Family History  Problem Relation Age of Onset  . Hypertension Mother   . Congestive Heart Failure Mother   . Kidney failure Mother   . Sickle cell trait Mother   . Hypertension Father   . Congestive Heart Failure Father   . Kidney failure Father   . Glaucoma Father   . Hypertension Brother   . Liver disease Brother   . Sickle cell trait Son   . Sickle cell trait Daughter   . Sickle cell trait Daughter    Past Surgical History:  Procedure Laterality Date  . BREAST SURGERY Right    biopsy  . TUBAL LIGATION     Social History   Social History Narrative  . Not on file   Immunization History  Administered Date(s) Administered  . Influenza,inj,Quad PF,6+ Mos 10/13/2019  . Moderna Sars-Covid-2 Vaccination 12/08/2019  . PFIZER(Purple Top)SARS-COV-2 Vaccination 05/21/2020     Objective: Vital Signs: BP (!) 131/93 (BP Location: Right Arm, Patient Position: Sitting, Cuff Size: Normal)   Pulse 89   Resp 17   Ht 5' 5.5" (1.664 m)   Wt 261 lb (118.4 kg)   BMI 42.77 kg/m    Physical Exam Vitals and nursing note reviewed.  Constitutional:      Appearance: She is well-developed.  HENT:     Head: Normocephalic and atraumatic.  Eyes:     Conjunctiva/sclera: Conjunctivae normal.  Cardiovascular:     Rate and Rhythm: Normal rate and regular rhythm.     Heart sounds: Normal heart sounds.  Pulmonary:     Effort: Pulmonary effort is normal.     Breath sounds: Normal breath sounds.  Abdominal:     General: Bowel sounds are normal.     Palpations: Abdomen is soft.  Musculoskeletal:     Cervical back: Normal range of motion.  Lymphadenopathy:     Cervical: No cervical adenopathy.  Skin:    General: Skin is warm and  dry.     Capillary Refill: Capillary refill takes less than 2 seconds.  Neurological:     Mental Status: She is alert and oriented to person, place, and time.  Psychiatric:        Behavior: Behavior normal.      Musculoskeletal Exam: She had discomfort range of motion of her cervical spine.  Lower back was difficult to assess due to body habitus.  Shoulder joints with good range of motion with some discomfort.  Elbow joints, wrist joints, MCPs PIPs and DIPs with good range of motion with no synovitis.  Hip joints and knee joints with good range of motion.  There was no warmth swelling or effusion over knee joints.  There was no tenderness over ankles, MTPs or PIPs.  No synovitis was noted.  CDAI Exam: CDAI Score: -- Patient Global: --; Provider Global: -- Swollen: --; Tender: -- Joint Exam 05/30/2020   No joint exam has been documented for this visit   There is currently no information documented on  the homunculus. Go to the Rheumatology activity and complete the homunculus joint exam.  Investigation: No additional findings.  Imaging: No results found.  Recent Labs: Lab Results  Component Value Date   WBC 9.6 03/25/2020   HGB 11.5 03/25/2020   PLT 362 03/25/2020   NA 137 03/25/2020   K 3.2 (L) 03/25/2020   CL 92 (L) 03/25/2020   CO2 31 (H) 03/25/2020   GLUCOSE 100 (H) 03/25/2020   BUN 9 03/25/2020   CREATININE 0.68 03/25/2020   BILITOT 0.3 03/25/2020   ALKPHOS 98 03/25/2020   AST 16 03/25/2020   ALT 12 03/25/2020   PROT 7.0 03/25/2020   ALBUMIN 4.2 03/25/2020   CALCIUM 9.0 03/25/2020   GFRAA 121 03/25/2020    Speciality Comments: No specialty comments available.  Procedures:  No procedures performed Allergies: Patient has no known allergies.   Assessment / Plan:     Visit Diagnoses: Positive ANA (antinuclear antibody) - 07/03/19: ANA 1:80NS, ESR 40, CRP 1.3, RF<14, Anti-CCP<16, Ace 12, BNP 10, -she has low titer positive ANA.  She has no clinical features of  systemic lupus or other autoimmune disease at this point.  I will obtain additional antibodies.  Plan: Anti-scleroderma antibody, RNP Antibody, Anti-Smith antibody, Sjogrens syndrome-A extractable nuclear antibody, Sjogrens syndrome-B extractable nuclear antibody, Anti-DNA antibody, double-stranded, C3 and C4  Ground glass opacity present on imaging of lung-she is followed by pulmonary.  All pulmonary records were reviewed.  CT chest findings were reviewed.  Interstitial pulmonary disease (HCC)  Asthma-COPD overlap syndrome (HCC)-patient gives longstanding history of COPD.  She is former smoker and quit smoking in 2019.  She gets frequent exacerbation of COPD requiring prednisone and inhalers.  PULMONARY SARCOIDOSIS - 2009-biopsy-proven per records.  She has had no flares of sarcoidosis since her initial visit per patient.  She was treated with prednisone.  Pain in both hands -she complains of pain and discomfort in her bilateral hands.  No synovitis was noted.  Plan: XR Hand 2 View Right, XR Hand 2 View Left.  X-rays of bilateral hands were consistent with osteoarthritis.  Chronic pain of both knees -she complains of knee joint discomfort for many years.  No warmth swelling or effusion was noted.  Plan: XR KNEE 3 VIEW RIGHT, XR KNEE 3 VIEW LEFT.  X-ray showed bilateral moderate osteoarthritis and moderate chondromalacia patella.  Pain in both feet -she complains of pain in bilateral feet.  No synovitis was noted.  Plan: XR Foot 2 Views Right, XR Foot 2 Views Left.  X-rays are consistent with osteoarthritis.  Chronic midline low back pain without sciatica -she has chronic lower back pain.  She states lower back pain is severe.  She denies any radiculopathy.  Per her request I will obtain x-ray of her lumbar spine.  Plan: XR Lumbar Spine 2-3 Views  Myalgia -she complains of achiness in all of her muscles.  Plan: CK, TSH  Other fatigue - Plan: TSH  Essential hypertension-her blood pressure is  elevated today.  I advised her to monitor blood pressure closely.  Former smoker - 1-1/2 pack/day for 20 years.  She quit smoking in 2019.  Orders: Orders Placed This Encounter  Procedures  . XR Hand 2 View Right  . XR Hand 2 View Left  . XR KNEE 3 VIEW RIGHT  . XR KNEE 3 VIEW LEFT  . XR Foot 2 Views Right  . XR Foot 2 Views Left  . XR Lumbar Spine 2-3 Views  . CK  .  TSH  . Anti-scleroderma antibody  . RNP Antibody  . Anti-Smith antibody  . Sjogrens syndrome-A extractable nuclear antibody  . Sjogrens syndrome-B extractable nuclear antibody  . Anti-DNA antibody, double-stranded  . C3 and C4   No orders of the defined types were placed in this encounter.    Follow-Up Instructions: Return for ILD, polyarthralgia, myalgia.   Bo Merino, MD  Note - This record has been created using Editor, commissioning.  Chart creation errors have been sought, but may not always  have been located. Such creation errors do not reflect on  the standard of medical care.

## 2020-05-26 ENCOUNTER — Other Ambulatory Visit: Payer: Self-pay

## 2020-05-29 DIAGNOSIS — J439 Emphysema, unspecified: Secondary | ICD-10-CM | POA: Diagnosis not present

## 2020-05-29 DIAGNOSIS — J849 Interstitial pulmonary disease, unspecified: Secondary | ICD-10-CM | POA: Diagnosis not present

## 2020-05-29 DIAGNOSIS — D869 Sarcoidosis, unspecified: Secondary | ICD-10-CM | POA: Diagnosis not present

## 2020-05-30 ENCOUNTER — Ambulatory Visit: Payer: Medicaid Other | Admitting: Rheumatology

## 2020-05-30 ENCOUNTER — Ambulatory Visit: Payer: Self-pay

## 2020-05-30 ENCOUNTER — Encounter: Payer: Self-pay | Admitting: Rheumatology

## 2020-05-30 ENCOUNTER — Other Ambulatory Visit: Payer: Self-pay

## 2020-05-30 VITALS — BP 131/93 | HR 89 | Resp 17 | Ht 65.5 in | Wt 261.0 lb

## 2020-05-30 DIAGNOSIS — J449 Chronic obstructive pulmonary disease, unspecified: Secondary | ICD-10-CM

## 2020-05-30 DIAGNOSIS — R768 Other specified abnormal immunological findings in serum: Secondary | ICD-10-CM | POA: Diagnosis not present

## 2020-05-30 DIAGNOSIS — G8929 Other chronic pain: Secondary | ICD-10-CM | POA: Diagnosis not present

## 2020-05-30 DIAGNOSIS — I10 Essential (primary) hypertension: Secondary | ICD-10-CM

## 2020-05-30 DIAGNOSIS — R918 Other nonspecific abnormal finding of lung field: Secondary | ICD-10-CM

## 2020-05-30 DIAGNOSIS — M79671 Pain in right foot: Secondary | ICD-10-CM | POA: Diagnosis not present

## 2020-05-30 DIAGNOSIS — D869 Sarcoidosis, unspecified: Secondary | ICD-10-CM | POA: Diagnosis not present

## 2020-05-30 DIAGNOSIS — J849 Interstitial pulmonary disease, unspecified: Secondary | ICD-10-CM

## 2020-05-30 DIAGNOSIS — M25561 Pain in right knee: Secondary | ICD-10-CM

## 2020-05-30 DIAGNOSIS — M79642 Pain in left hand: Secondary | ICD-10-CM | POA: Diagnosis not present

## 2020-05-30 DIAGNOSIS — M25562 Pain in left knee: Secondary | ICD-10-CM

## 2020-05-30 DIAGNOSIS — M79672 Pain in left foot: Secondary | ICD-10-CM | POA: Diagnosis not present

## 2020-05-30 DIAGNOSIS — M5459 Other low back pain: Secondary | ICD-10-CM

## 2020-05-30 DIAGNOSIS — Z87891 Personal history of nicotine dependence: Secondary | ICD-10-CM

## 2020-05-30 DIAGNOSIS — R32 Unspecified urinary incontinence: Secondary | ICD-10-CM | POA: Diagnosis not present

## 2020-05-30 DIAGNOSIS — R5383 Other fatigue: Secondary | ICD-10-CM | POA: Diagnosis not present

## 2020-05-30 DIAGNOSIS — M545 Low back pain, unspecified: Secondary | ICD-10-CM

## 2020-05-30 DIAGNOSIS — M791 Myalgia, unspecified site: Secondary | ICD-10-CM

## 2020-05-30 DIAGNOSIS — M79641 Pain in right hand: Secondary | ICD-10-CM | POA: Diagnosis not present

## 2020-05-31 LAB — CK: Total CK: 144 U/L — ABNORMAL HIGH (ref 29–143)

## 2020-05-31 LAB — SJOGRENS SYNDROME-B EXTRACTABLE NUCLEAR ANTIBODY: SSB (La) (ENA) Antibody, IgG: <1 AI

## 2020-05-31 LAB — C3 AND C4
C3 Complement: 184 mg/dL (ref 83–193)
C4 Complement: 41 mg/dL (ref 15–57)

## 2020-05-31 LAB — ANTI-DNA ANTIBODY, DOUBLE-STRANDED: ds DNA Ab: 1 IU/mL

## 2020-05-31 LAB — ANTI-SMITH ANTIBODY: ENA SM Ab Ser-aCnc: <1 AI

## 2020-05-31 LAB — RNP ANTIBODY: Ribonucleic Protein(ENA) Antibody, IgG: <1 AI

## 2020-05-31 LAB — SJOGRENS SYNDROME-A EXTRACTABLE NUCLEAR ANTIBODY: SSA (Ro) (ENA) Antibody, IgG: <1 AI

## 2020-05-31 LAB — ANTI-SCLERODERMA ANTIBODY: Scleroderma (Scl-70) (ENA) Antibody, IgG: <1 AI

## 2020-05-31 LAB — TSH: TSH: 0.83 mIU/L

## 2020-05-31 NOTE — Progress Notes (Signed)
CK is mildly elevated which is not significant.  TSH is normal.  All other autoimmune labs are negative.  I will discuss results at the follow-up visit.

## 2020-06-01 ENCOUNTER — Telehealth: Payer: Self-pay | Admitting: Pulmonary Disease

## 2020-06-01 DIAGNOSIS — J45909 Unspecified asthma, uncomplicated: Secondary | ICD-10-CM

## 2020-06-01 MED ORDER — ALBUTEROL SULFATE HFA 108 (90 BASE) MCG/ACT IN AERS: INHALATION_SPRAY | RESPIRATORY_TRACT | 2 refills | Status: DC

## 2020-06-01 NOTE — Telephone Encounter (Signed)
Primary Pulmonologist: [facility] but switching to [place]  Last office visit and with whom: 03/22/20 with Beth  What do we see them for (pulmonary problems): asthma/copd Last OV assessment/plan: Asthma-COPD overlap syndrome (HCC) - Increased shortness of breath x 1 week d/t weather change. Nocturnal cough with intermittent wheezing. She is compliant with Advair 500-39mcg. Sending in prednisone taper for acute exacerbation of her asthma symptoms.   Former smoker -  Former smoker, quit 2020 - She can be referred to lung cancer screening age 45   GERD (gastroesophageal reflux disease) - Nocturnal cough likely d/t uncontrolled GERD - Continue Omeprazole once daily in the morning  - Start Famotidine (pepcid) 20mg  at bedtime   Breast nodule - She had a right breast ultrasound guided bx in October 2021. Pathology revealed fibroadenoma right breast. Need right diagnostic mammography and possible ultrasound in 6 months (April 2022).  Ground glass opacity present on imaging of lung - CT chest in September 2021 showed interval clearing previously seen bilateral patchy pulmonary parenchymal ground glass, indicative of infectious/inflammatory etiology  PULMONARY SARCOIDOSIS - No active signs of sarcoidosis flare  3 months with [name] or Erin Fulling (new patient- former Carlis Abbott)  Was appointment offered to patient (explain)?  She wanted recommendations first. She is scheduled to see [place] later this month.    Reason for call: Spoke with patient. She stated that she has noticed an increase in her SOB for the past 4 days. She believes it is related to the pollen because she has noticed the SOB really increases when she is outside. She is currently on 3L of O2. She does have a cough but the phlegm has been clear so far. Denied any fevers. She took an at home covid test today which came back negative. She is still using her Advair inhaler daily.   (examples of things to ask: : When did symptoms start? Fever?  Cough? Productive? Color to sputum? More sputum than usual? Wheezing? Have you needed increased oxygen? Are you taking your respiratory medications? What over the counter measures have you tried?)  No Known Allergies  Immunization History  Administered Date(s) Administered  . Influenza,inj,Quad PF,6+ Mos 10/13/2019  . Moderna Sars-Covid-2 Vaccination 12/08/2019  . PFIZER(Purple Top)SARS-COV-2 Vaccination 05/21/2020   Pharmacy is [facility] on [address].   She is requesting prednisone.   SG, can you please advise since MH is off today. Thanks.

## 2020-06-01 NOTE — Telephone Encounter (Signed)
OK to renew rescue inhaler. Send in Prednisone taper; 10 mg tablets: 4 tabs x 2 days, 3 tabs x 2 days, 2 tabs x 2 days 1 tab x 2 days then stop. MUST be seen in office for follow up.  Make sure she is taking non sedating antihistamine as this is most likely related to pollen and weather change. She must seek emergency care if shortness of breath does not improve with treatment.

## 2020-06-01 NOTE — Telephone Encounter (Signed)
I have called and LM on VM for the pt to call us back.   Pended the medications until we speak with the pt.

## 2020-06-02 MED ORDER — PREDNISONE 10 MG PO TABS: ORAL_TABLET | ORAL | 0 refills | Status: DC

## 2020-06-02 NOTE — Telephone Encounter (Signed)
Called and spoke with patient. She is aware of the prednisone taper and instructions. She is aware to keep her appt with [name]. Medication [facility].   Nothing further needed at time of call.

## 2020-06-02 NOTE — Telephone Encounter (Signed)
ATC patient.  LMTCB. 

## 2020-06-17 NOTE — Progress Notes (Deleted)
Office Visit Note  Patient: Teresa Mendez             Date of Birth: [birth year]           MRN: [id number]             PCP: [name], NP Referring: [name], NP Visit Date: 06/23/2020 Occupation: @GUAROCC @  Subjective:  No chief complaint on file.   History of Present Illness: Teresa Mendez is a 45 y.o. female ***   Activities of Daily Living:  Patient reports morning stiffness for *** {minute/hour:19697}.   Patient {ACTIONS;DENIES/REPORTS:21021675::"Denies"} nocturnal pain.  Difficulty dressing/grooming: {ACTIONS;DENIES/REPORTS:21021675::"Denies"} Difficulty climbing stairs: {ACTIONS;DENIES/REPORTS:21021675::"Denies"} Difficulty getting out of chair: {ACTIONS;DENIES/REPORTS:21021675::"Denies"} Difficulty using hands for taps, buttons, cutlery, and/or writing: {ACTIONS;DENIES/REPORTS:21021675::"Denies"}  No Rheumatology ROS completed.   PMFS History:  Patient Active Problem List   Diagnosis Date Noted  . Former smoker 03/24/2020  . Breast nodule 03/24/2020  . GERD (gastroesophageal reflux disease) 09/07/2019  . Ground glass opacity present on imaging of lung 09/07/2019  . Interstitial pulmonary disease ([place]) 09/07/2019  . Emphysema lung ([place]) 07/03/2019  . Abnormal findings on diagnostic imaging of lung 07/03/2019  . Myalgia 07/03/2019  . Hypertensive urgency 05/18/2019  . Chest pain 05/18/2019  . Acute respiratory failure with hypoxia ([place]) 05/17/2019  . 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 Asthma-COPD overlap syndrome ([place]) 07/14/2007  . Essential hypertension 06/27/2007  . DYSPNEA 06/27/2007    Past Medical History:  Diagnosis Date  . Asthma   . Hypertension   . Sarcoidosis   . Sickle cell trait (HCC)     Family History  Problem Relation Age of Onset  . Hypertension Mother   . Congestive Heart Failure Mother   . Kidney failure Mother   . Sickle cell trait Mother   . Hypertension Father   . Congestive Heart Failure Father   . Kidney  failure Father   . Glaucoma Father   . Hypertension Brother   . Liver disease Brother   . Sickle cell trait Son   . Sickle cell trait Daughter   . Sickle cell trait Daughter    Past Surgical History:  Procedure Laterality Date  . BREAST SURGERY Right    biopsy  . TUBAL LIGATION     Social History   Social History Narrative  . Not on file   Immunization History  Administered Date(s) Administered  . Influenza,inj,Quad PF,6+ Mos 10/13/2019  . Moderna Sars-Covid-2 Vaccination 12/08/2019  . PFIZER(Purple Top)SARS-COV-2 Vaccination 05/21/2020     Objective: Vital Signs: There were no vitals taken for this visit.   Physical Exam   Musculoskeletal Exam: ***  CDAI Exam: CDAI Score: -- Patient Global: --; Provider Global: -- Swollen: --; Tender: -- Joint Exam 06/23/2020   No joint exam has been documented for this visit   There is currently no information documented on the homunculus. Go to the Rheumatology activity and complete the homunculus joint exam.  Investigation: No additional findings.  Imaging: XR Foot 2 Views Left  Result Date: 05/30/2020 PIP and DIP narrowing was noted.  No intertarsal, tibiotalar or subtalar joint space narrowing was noted.  No erosive changes were noted. Impression: These findings are consistent with osteoarthritis of the foot.  XR Foot 2 Views Right  Result Date: 05/30/2020 PIP and DIP narrowing was noted.  No intertarsal, tibiotalar or subtalar joint space narrowing was noted.  No erosive changes were noted. Impression: These findings are consistent with osteoarthritis of the foot.  XR Hand  2 View Left  Result Date: 05/30/2020 PIP and DIP narrowing was noted.  No MCP, intercarpal or radiocarpal joint space narrowing was noted.  No erosive changes were noted. Impression: These findings are consistent with osteoarthritis of the hand.  XR Hand 2 View Right  Result Date: 05/30/2020 PIP and DIP narrowing was noted.  No MCP, intercarpal or  radiocarpal joint space narrowing was noted.  No erosive changes were noted. Impression: These findings are consistent with osteoarthritis of the hand.  XR KNEE 3 VIEW LEFT  Result Date: 05/30/2020 Moderate medial compartment narrowing was noted.  Moderate patellofemoral narrowing was noted.  No chondrocalcinosis was noted. Impression: These findings are consistent moderate osteoarthritis and moderate chondromalacia patella.  XR KNEE 3 VIEW RIGHT  Result Date: 05/30/2020 Moderate medial compartment narrowing was noted.  Moderate patellofemoral narrowing was noted.  No chondrocalcinosis was noted. Impression: These findings are consistent moderate osteoarthritis and moderate chondromalacia patella.  XR Lumbar Spine 2-3 Views  Result Date: 05/30/2020 No significant disc space narrowing was noted.  Facet joint arthropathy was noted.  No SI joint changes were noted. Impression: These findings are consistent with facet joint arthropathy.   Recent Labs: Lab Results  Component Value Date   WBC 9.6 03/25/2020   HGB 11.5 03/25/2020   PLT 362 03/25/2020   NA 137 03/25/2020   K 3.2 (L) 03/25/2020   CL 92 (L) 03/25/2020   CO2 31 (H) 03/25/2020   GLUCOSE 100 (H) 03/25/2020   BUN 9 03/25/2020   CREATININE 0.68 03/25/2020   BILITOT 0.3 03/25/2020   ALKPHOS 98 03/25/2020   AST 16 03/25/2020   ALT 12 03/25/2020   PROT 7.0 03/25/2020   ALBUMIN 4.2 03/25/2020   CALCIUM 9.0 03/25/2020   GFRAA 121 03/25/2020   May 30, 2020 ENA negative, complements normal, CK1 44, TSH normal  07/03/19: ANA 1:80NS, ESR 40, CRP 1.3, RF<14, Anti-CCP<16, Ace 12, BNP 10  Speciality Comments: No specialty comments available.  Procedures:  No procedures performed Allergies: Patient has no known allergies.   Assessment / Plan:     Visit Diagnoses: No diagnosis found.  Orders: No orders of the defined types were placed in this encounter.  No orders of the defined types were placed in this  encounter.   Face-to-face time spent with patient was *** minutes. Greater than 50% of time was spent in counseling and coordination of care.  Follow-Up Instructions: No follow-ups on file.   Bo Merino, MD  Note - This record has been created using Editor, commissioning.  Chart creation errors have been sought, but may not always  have been located. Such creation errors do not reflect on  the standard of medical care.

## 2020-06-20 ENCOUNTER — Other Ambulatory Visit: Payer: Self-pay

## 2020-06-20 ENCOUNTER — Ambulatory Visit (INDEPENDENT_AMBULATORY_CARE_PROVIDER_SITE_OTHER): Payer: Medicaid Other | Admitting: Pulmonary Disease

## 2020-06-20 ENCOUNTER — Encounter: Payer: Self-pay | Admitting: Pulmonary Disease

## 2020-06-20 VITALS — BP 160/90 | HR 106 | Temp 97.8°F | Ht 61.0 in | Wt 258.0 lb

## 2020-06-20 DIAGNOSIS — J9601 Acute respiratory failure with hypoxia: Secondary | ICD-10-CM | POA: Diagnosis not present

## 2020-06-20 DIAGNOSIS — J449 Chronic obstructive pulmonary disease, unspecified: Secondary | ICD-10-CM

## 2020-06-20 DIAGNOSIS — J438 Other emphysema: Secondary | ICD-10-CM | POA: Diagnosis not present

## 2020-06-20 DIAGNOSIS — R0602 Shortness of breath: Secondary | ICD-10-CM | POA: Diagnosis not present

## 2020-06-20 MED ORDER — BREZTRI AEROSPHERE 160-9-4.8 MCG/ACT IN AERO
2.0000 | INHALATION_SPRAY | Freq: Two times a day (BID) | RESPIRATORY_TRACT | 11 refills | Status: DC
  Filled 2020-06-20 – 2020-09-12 (×4): qty 10.7, 30d supply, fill #0
  Filled 2021-01-31: qty 10.7, 30d supply, fill #1
  Filled 2021-03-28: qty 10.7, 30d supply, fill #0

## 2020-06-20 MED ORDER — BREZTRI AEROSPHERE 160-9-4.8 MCG/ACT IN AERO: 2.0000 | INHALATION_SPRAY | Freq: Two times a day (BID) | RESPIRATORY_TRACT | 0 refills | Status: DC

## 2020-06-20 NOTE — Patient Instructions (Signed)
Nice to meet you  For now stop Advair  Use Breztri 2 puffs twice a day, rinse your mouth out after every use.  If insurance does not approve this we may need to a prior authorization for we may need to resume the Advair and add another inhaler so you can get all the medicines that are needed.  Please call us if the Judithann Sauger is too expensive and we will work on that.  Return to clinic for follow-up with [name] in 6 months or sooner as needed

## 2020-06-20 NOTE — Progress Notes (Signed)
Synopsis: Referred in 2021 for asthma by Teresa Pounds, NP. Formerly a patient of Dr. [name] in 2009.  Subjective:   PATIENT ID: Teresa Mendez GENDER: female DOB: [birth year], MRN: [id number]  Chief Complaint  Patient presents with  . Follow-up    SOB improved    [name] is a 45 y/o woman who presents for follow-up of chronic hypoxic respiratory failure due to COPD and sarcoidosis (previously biopsy-proven in 2009).  Also with asthma (sig BD response) that responds well to prednisone, worsens with outdoor activities.  She  continues on Zyrtec, montelukast, and Advair 500.  She still using 3 L supplemental oxygen due to dropping to 90% on 1 L at home and tachycardia.  No recent treatment for sarcoidosis. Treated for asthma exacerbation first week May 2022 with improvement after steroids.    Patient feels okay today.  Certainly improved from earlier in the month when she was treated for asthma exacerbation.  Current course of prednisone stated helped very much.  Improved wheezing, improved dyspnea on exertion.  She has baseline dyspnea that is largely unchanged but certainly a problem.  Not sure if Advair is helping very much.  She thinks she was last treated prior to May 2022 for exacerbation late in 2021.  At least 2 exacerbations in the last year.  Discussed at length the rationale for triple inhaled therapy which I think she would benefit from.  Reviewed prior PFTs in 2021 that showed significant bronchodilator response as well as hyperinflation.  Review chest x-ray 03/2020 that shows bullous upper lobe disease otherwise clear lungs.  2 most recent pulmonary notes reviewed.     Past Medical History:  Diagnosis Date  . Asthma   . Hypertension   . Sarcoidosis   . Sickle cell trait (HCC)      Family History  Problem Relation Age of Onset  . Hypertension Mother   . Congestive Heart Failure Mother   . Kidney failure Mother   . Sickle cell trait Mother   . Hypertension Father    . Congestive Heart Failure Father   . Kidney failure Father   . Glaucoma Father   . Hypertension Brother   . Liver disease Brother   . Sickle cell trait Son   . Sickle cell trait Daughter   . Sickle cell trait Daughter      Past Surgical History:  Procedure Laterality Date  . BREAST SURGERY Right    biopsy  . TUBAL LIGATION      Social History   Socioeconomic History  . Marital status: Single    Spouse name: Not on file  . Number of children: Not on file  . Years of education: Not on file  . Highest education level: Not on file  Occupational History  . Not on file  Tobacco Use  . Smoking status: Former Smoker    Packs/day: 0.50    Types: Cigarettes    Quit date: 01/03/2019    Years since quitting: 1.4  . Smokeless tobacco: Never Used  Vaping Use  . Vaping Use: Former  Substance and Sexual Activity  . Alcohol use: Yes    Comment: Occasionally   . Drug use: No  . Sexual activity: Yes  Other Topics Concern  . Not on file  Social History Narrative  . Not on file   Social Determinants of Health   Financial Resource Strain: Not on file  Food Insecurity: Not on file  Transportation Needs: Not on file  Physical  Activity: Not on file  Stress: Not on file  Social Connections: Not on file  Intimate Partner Violence: Not on file     No Known Allergies   Immunization History  Administered Date(s) Administered  . Influenza,inj,Quad PF,6+ Mos 10/13/2019  . PFIZER(Purple Top)SARS-COV-2 Vaccination 05/21/2020, 06/16/2020    Outpatient Medications Prior to Visit  Medication Sig Dispense Refill  . albuterol (PROVENTIL) (2.5 MG/3ML) 0.083% nebulizer solution TAKE 3 MLS (2.5 MG TOTAL) BY NEBULIZATION EVERY 6 (SIX) HOURS AS NEEDED FOR WHEEZING OR SHORTNESS OF BREATH. 150 mL 11  . albuterol (VENTOLIN HFA) 108 (90 Base) MCG/ACT inhaler INHALE 1-2 PUFFS INTO THE LUNGS EVERY 6 (SIX) HOURS AS NEEDED FOR WHEEZING OR SHORTNESS OF BREATH (COUGH). 8.5 g 0  . amLODipine  (NORVASC) 10 MG tablet TAKE 1 TABLET (10 MG TOTAL) BY MOUTH DAILY. 90 tablet 1  . aspirin EC 81 MG tablet Take 81 mg by mouth daily.    Marland Kitchen atorvastatin (LIPITOR) 80 MG tablet TAKE 1 TABLET (80 MG TOTAL) BY MOUTH DAILY. 90 tablet 0  . carvedilol (COREG) 3.125 MG tablet TAKE 1 TABLET (3.125 MG TOTAL) BY MOUTH 2 (TWO) TIMES DAILY WITH A MEAL. 180 tablet 1  . cetirizine (ZYRTEC) 10 MG tablet TAKE 1 TABLET (10 MG TOTAL) BY MOUTH DAILY. 30 tablet 11  . DULoxetine (CYMBALTA) 60 MG capsule TAKE 1 CAPSULE (60 MG TOTAL) BY MOUTH DAILY. 90 capsule 0  . famotidine (PEPCID) 20 MG tablet Take 1 tablet (20 mg total) by mouth at bedtime. 30 tablet 3  . fluticasone (FLONASE) 50 MCG/ACT nasal spray Place 2 sprays into both nostrils daily. 16 g 6  . hydrochlorothiazide (HYDRODIURIL) 25 MG tablet TAKE 1 TABLET (25 MG TOTAL) BY MOUTH DAILY. 90 tablet 2  . lisinopril (ZESTRIL) 40 MG tablet TAKE 1 TABLET (40 MG TOTAL) BY MOUTH DAILY. 60 tablet 2  . Masks MISC Nebulizer face mask J45.909 1 each 0  . montelukast (SINGULAIR) 10 MG tablet TAKE 1 TABLET (10 MG TOTAL) BY MOUTH AT BEDTIME. 30 tablet 11  . omeprazole (PRILOSEC) 20 MG capsule TAKE 1 CAPSULE (20 MG TOTAL) BY MOUTH DAILY. 90 capsule 1  . ondansetron (ZOFRAN-ODT) 4 MG disintegrating tablet PLACE ON THE TONGUE TO DISSOLVE. DO NOT SWALLOW. TAKE ONE TABLET BY MOUTH EVERY 8 HOURS AS NEEDED FOR NAUSEA OR VOMITING. 40 tablet 1  . OXYGEN Inhale into the lungs. 3 liters    . potassium chloride SA (KLOR-CON) 20 MEQ tablet TAKE 1 TABLET (20 MEQ TOTAL) BY MOUTH DAILY FOR 14 DAYS. 14 tablet 0  . senna-docusate (SENOKOT-S) 8.6-50 MG tablet TAKE 2 TABLETS BY MOUTH DAILY. 60 tablet 2  . albuterol (PROAIR HFA) 108 (90 Base) MCG/ACT inhaler INHALE 1-2 PUFFS INTO THE LUNGS EVERY 6 (SIX) HOURS AS NEEDED FOR WHEEZING OR SHORTNESS OF BREATH (COUGH). 8.5 g 2  . fluticasone-salmeterol (ADVAIR) 500-50 MCG/ACT AEPB Inhale 1 puff into the lungs in the morning and at bedtime. 60 each 9  .  predniSONE (DELTASONE) 10 MG tablet Take 4 tabs x 2 days, 3 tabs x 2 days, 2 tabs x 2 days, 1 tab x 2 days then stop 20 tablet 0  . ferrous sulfate 325 (65 FE) MG tablet Take 1 tablet (325 mg total) by mouth daily with breakfast. (Patient taking differently: Take 325 mg by mouth daily with breakfast. taking) 30 tablet 2   No facility-administered medications prior to visit.      Objective:   Vitals:   06/20/20 1454  BP: (!) 160/90  Pulse: (!) 106  Temp: 97.8 F (36.6 C)  SpO2: 99%  Weight: 258 lb (117 kg)  Height: _0  (1.549 m)   99% on 3 LPM    (83% on RA) BMI Readings from Last 3 Encounters:  06/20/20 48.75 kg/m  05/30/20 42.77 kg/m  03/25/20 43.26 kg/m   Wt Readings from Last 3 Encounters:  06/20/20 258 lb (117 kg)  05/30/20 261 lb (118.4 kg)  03/25/20 252 lb (114.3 kg)    Physical exam: General: Well-appearing, no acute distress Eyes: EOMI, no icterus Pulmonary: Distant sounds, clear to auscultation bilaterally, normal work of breathing on nasal cannula Cardiovascular: Tachycardic, regular rhythm  CBC    Component Value Date/Time   WBC 9.6 03/25/2020 1214   WBC 11.2 (H) 11/27/2019 0117   RBC 4.68 03/25/2020 1214   RBC 4.08 11/27/2019 0117   HGB 11.5 03/25/2020 1214   HCT 36.6 03/25/2020 1214   PLT 362 03/25/2020 1214   MCV 78 (L) 03/25/2020 1214   MCH 24.6 (L) 03/25/2020 1214   MCH 25.5 (L) 11/27/2019 0117   MCHC 31.4 (L) 03/25/2020 1214   MCHC 31.0 11/27/2019 0117   RDW 14.0 03/25/2020 1214   LYMPHSABS 2,930 07/03/2019 1647   LYMPHSABS 2.3 06/03/2019 1441   MONOABS 0.4 05/22/2019 0702   EOSABS 168 07/03/2019 1647   EOSABS 0.3 06/03/2019 1441   BASOSABS 30 07/03/2019 1647   BASOSABS 0.0 06/03/2019 1441    CHEMISTRY No results for input(s): NA, K, CL, CO2, GLUCOSE, BUN, CREATININE, CALCIUM, MG, PHOS in the last 168 hours. CrCl cannot be calculated (Patient's most recent lab result is older than the maximum 21 days allowed.).   HIV negative  04/2019 A1AT WNL  CRP 1.3 (WNL) on 07/07/19 ESR 40 on 07/07/19  Chest Imaging- films reviewed: CT scan reviewed- Paraseptal and centrilobular emphysema with blebs. Possibly increased GGO throughout remaining lung tissue. No PE.  Pulmonary Functions Testing Results: PFT Results Latest Ref Rng & Units 07/03/2019  FVC-Pre L 1.89  FVC-Predicted Pre % 60  FVC-Post L 2.41  FVC-Predicted Post % 76  Pre FEV1/FVC % % 47  Post FEV1/FCV % % 50  FEV1-Pre L 0.89  FEV1-Predicted Pre % 34  FEV1-Post L 1.20  DLCO uncorrected ml/min/mmHg 17.25  DLCO UNC% % 76  DLCO corrected ml/min/mmHg 19.39  DLCO COR %Predicted % 86  DLVA Predicted % 116  TLC L 4.65  TLC % Predicted % 88  RV % Predicted % 140   2021- severe obstruction with significant bronchodilator reversibility.  Air trapping without hyperinflation or restriction.  Mild diffusion impairment.  Flow volume loop supports mixed obstruction and restriction.  Echocardiogram 05/18/19: LVEF 60-65%, mild LVH, indeterminate diastolic function. Mildly dilated LA. Normal RV function. Trivial AI. Ascending aortic aneurysm.      Assessment & Plan:     ICD-10-CM   1. Other emphysema ([place])  J43.8   2. Asthma-COPD overlap syndrome ([place])  J44.9   3. Acute respiratory failure with hypoxia (HCC)  J96.01   4. DYSPNEA  R06.02       Chronic hypoxic respiratory failure- likely multifactorial- sarcoid, COPD, asthma: 2 exacerbations last several months responsive to oral prednisone. -Escalate high dose advair to high dose Breztri - may require prior auth, triple therapy justified per GOLD COPD guidelines for Stage D disease in effort to reduce risk of repeat exacerbation  Severe persistent allergic (eosinophilic) asthma; likely COPD overlap -Breztri -Continue montelukast -albuterol PRN -Consider biologic therapy if continues  to exacerbate on ICS/LABA/LAMA  Allergic rhinitis -Continue antihistamine -Continue montelukast  RTC in 6 months.     Current  Outpatient Medications:  .  albuterol (PROVENTIL) (2.5 MG/3ML) 0.083% nebulizer solution, TAKE 3 MLS (2.5 MG TOTAL) BY NEBULIZATION EVERY 6 (SIX) HOURS AS NEEDED FOR WHEEZING OR SHORTNESS OF BREATH., Disp: 150 mL, Rfl: 11 .  albuterol (VENTOLIN HFA) 108 (90 Base) MCG/ACT inhaler, INHALE 1-2 PUFFS INTO THE LUNGS EVERY 6 (SIX) HOURS AS NEEDED FOR WHEEZING OR SHORTNESS OF BREATH (COUGH)., Disp: 8.5 g, Rfl: 0 .  amLODipine (NORVASC) 10 MG tablet, TAKE 1 TABLET (10 MG TOTAL) BY MOUTH DAILY., Disp: 90 tablet, Rfl: 1 .  aspirin EC 81 MG tablet, Take 81 mg by mouth daily., Disp: , Rfl:  .  atorvastatin (LIPITOR) 80 MG tablet, TAKE 1 TABLET (80 MG TOTAL) BY MOUTH DAILY., Disp: 90 tablet, Rfl: 0 .  Budeson-Glycopyrrol-Formoterol (BREZTRI AEROSPHERE) 160-9-4.8 MCG/ACT AERO, Inhale 2 puffs into the lungs in the morning and at bedtime., Disp: 4.8 g, Rfl: 0 .  Budeson-Glycopyrrol-Formoterol (BREZTRI AEROSPHERE) 160-9-4.8 MCG/ACT AERO, Inhale 2 puffs into the lungs in the morning and at bedtime., Disp: 10.7 g, Rfl: 11 .  carvedilol (COREG) 3.125 MG tablet, TAKE 1 TABLET (3.125 MG TOTAL) BY MOUTH 2 (TWO) TIMES DAILY WITH A MEAL., Disp: 180 tablet, Rfl: 1 .  cetirizine (ZYRTEC) 10 MG tablet, TAKE 1 TABLET (10 MG TOTAL) BY MOUTH DAILY., Disp: 30 tablet, Rfl: 11 .  DULoxetine (CYMBALTA) 60 MG capsule, TAKE 1 CAPSULE (60 MG TOTAL) BY MOUTH DAILY., Disp: 90 capsule, Rfl: 0 .  famotidine (PEPCID) 20 MG tablet, Take 1 tablet (20 mg total) by mouth at bedtime., Disp: 30 tablet, Rfl: 3 .  fluticasone (FLONASE) 50 MCG/ACT nasal spray, Place 2 sprays into both nostrils daily., Disp: 16 g, Rfl: 6 .  hydrochlorothiazide (HYDRODIURIL) 25 MG tablet, TAKE 1 TABLET (25 MG TOTAL) BY MOUTH DAILY., Disp: 90 tablet, Rfl: 2 .  lisinopril (ZESTRIL) 40 MG tablet, TAKE 1 TABLET (40 MG TOTAL) BY MOUTH DAILY., Disp: 60 tablet, Rfl: 2 .  Masks MISC, Nebulizer face mask J45.909, Disp: 1 each, Rfl: 0 .  montelukast (SINGULAIR) 10 MG tablet, TAKE  1 TABLET (10 MG TOTAL) BY MOUTH AT BEDTIME., Disp: 30 tablet, Rfl: 11 .  omeprazole (PRILOSEC) 20 MG capsule, TAKE 1 CAPSULE (20 MG TOTAL) BY MOUTH DAILY., Disp: 90 capsule, Rfl: 1 .  ondansetron (ZOFRAN-ODT) 4 MG disintegrating tablet, PLACE ON THE TONGUE TO DISSOLVE. DO NOT SWALLOW. TAKE ONE TABLET BY MOUTH EVERY 8 HOURS AS NEEDED FOR NAUSEA OR VOMITING., Disp: 40 tablet, Rfl: 1 .  OXYGEN, Inhale into the lungs. 3 liters, Disp: , Rfl:  .  potassium chloride SA (KLOR-CON) 20 MEQ tablet, TAKE 1 TABLET (20 MEQ TOTAL) BY MOUTH DAILY FOR 14 DAYS., Disp: 14 tablet, Rfl: 0 .  senna-docusate (SENOKOT-S) 8.6-50 MG tablet, TAKE 2 TABLETS BY MOUTH DAILY., Disp: 60 tablet, Rfl: 2 .  ferrous sulfate 325 (65 FE) MG tablet, Take 1 tablet (325 mg total) by mouth daily with breakfast. (Patient taking differently: Take 325 mg by mouth daily with breakfast. taking), Disp: 30 tablet, Rfl: 2     Lanier Clam, MD [facility] 06/20/2020 3:31 PM

## 2020-06-23 ENCOUNTER — Ambulatory Visit: Payer: Medicaid Other | Admitting: Rheumatology

## 2020-06-23 DIAGNOSIS — M19071 Primary osteoarthritis, right ankle and foot: Secondary | ICD-10-CM

## 2020-06-23 DIAGNOSIS — J849 Interstitial pulmonary disease, unspecified: Secondary | ICD-10-CM

## 2020-06-23 DIAGNOSIS — M47816 Spondylosis without myelopathy or radiculopathy, lumbar region: Secondary | ICD-10-CM

## 2020-06-23 DIAGNOSIS — J449 Chronic obstructive pulmonary disease, unspecified: Secondary | ICD-10-CM

## 2020-06-23 DIAGNOSIS — I10 Essential (primary) hypertension: Secondary | ICD-10-CM

## 2020-06-23 DIAGNOSIS — D869 Sarcoidosis, unspecified: Secondary | ICD-10-CM

## 2020-06-23 DIAGNOSIS — R768 Other specified abnormal immunological findings in serum: Secondary | ICD-10-CM

## 2020-06-23 DIAGNOSIS — M170 Bilateral primary osteoarthritis of knee: Secondary | ICD-10-CM

## 2020-06-23 DIAGNOSIS — Z87891 Personal history of nicotine dependence: Secondary | ICD-10-CM

## 2020-06-23 DIAGNOSIS — M19041 Primary osteoarthritis, right hand: Secondary | ICD-10-CM

## 2020-06-29 ENCOUNTER — Ambulatory Visit: Payer: Medicaid Other | Attending: Nurse Practitioner | Admitting: Nurse Practitioner

## 2020-06-29 ENCOUNTER — Other Ambulatory Visit: Payer: Self-pay

## 2020-06-29 ENCOUNTER — Telehealth: Payer: Self-pay | Admitting: Pulmonary Disease

## 2020-06-29 ENCOUNTER — Encounter: Payer: Self-pay | Admitting: Nurse Practitioner

## 2020-06-29 DIAGNOSIS — J849 Interstitial pulmonary disease, unspecified: Secondary | ICD-10-CM | POA: Diagnosis not present

## 2020-06-29 DIAGNOSIS — J439 Emphysema, unspecified: Secondary | ICD-10-CM | POA: Diagnosis not present

## 2020-06-29 DIAGNOSIS — I10 Essential (primary) hypertension: Secondary | ICD-10-CM

## 2020-06-29 DIAGNOSIS — R32 Unspecified urinary incontinence: Secondary | ICD-10-CM | POA: Diagnosis not present

## 2020-06-29 DIAGNOSIS — D869 Sarcoidosis, unspecified: Secondary | ICD-10-CM | POA: Diagnosis not present

## 2020-06-29 NOTE — Progress Notes (Signed)
Virtual Visit via Telephone Note Due to national recommendations of social distancing due to [place] 19, telehealth visit is felt to be most appropriate for this patient at this time.  I discussed the limitations, risks, security and privacy concerns of performing an evaluation and management service by telephone and the availability of in person appointments. I also discussed with the patient that there may be a patient responsible charge related to this service. The patient expressed understanding and agreed to proceed.    I connected with Teresa Mendez on 06/29/20  at   9:50 AM EDT  EDT by telephone and verified that I am speaking with the correct person using two identifiers.   Consent I discussed the limitations, risks, security and privacy concerns of performing an evaluation and management service by telephone and the availability of in person appointments. I also discussed with the patient that there may be a patient responsible charge related to this service. The patient expressed understanding and agreed to proceed.   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  She was started on Breztri a few weeks ago by her pulmonologist and is currently awaiting prior authorization as it was initially not approved by her insurance.  She states she has noted significant improvement in her breathing with taking the samples of Breztri given by her pulmonologist.  Other pulmonary medications include Proventil nebs, albuterol inhaler as needed, montelukast and cetirizine 10 mg daily.  She also uses supplemental O2.  She was referred to Dr. Estanislado Pandy for positive  ANA.  Labs negative for any autoimmune disease.  Imaging mostly showing moderate osteo-arthritis in multiple joints, lumbar facet joint arthropathy and moderate chondromalacia of the left patella.  She missed her follow-up appointment with the rheumatologist few weeks ago due to family issues.  I have encouraged her to follow-up and schedule another appointment.  ESSENTIAL HYPERTENSION She endorses adherence taking carvedilol 3.125 mg twice daily, amlodipine 10 mg daily lisinopril 40 mg daily and hydrochlorothiazide 25 mg daily.  If potassium continues borderline low will need to stop hydrochlorothiazide and likely increase carvedilol. Denies chest pain, shortness of breath, palpitations, lightheadedness, dizziness, headaches or BLE edema.  BP Readings from Last 3 Encounters:  06/20/20 (!) 160/90  05/30/20 (!) 131/93  03/25/20 110/74    Past Medical History:  Diagnosis Date  . Asthma   . Hypertension   . Sarcoidosis   . Sickle cell trait [facility])     Past Surgical History:  Procedure Laterality Date  . BREAST SURGERY Right    biopsy  . TUBAL LIGATION      Family History  Problem Relation Age of Onset  . Hypertension Mother   . Congestive Heart Failure Mother   . Kidney failure Mother   . Sickle cell trait Mother   . Hypertension Father   . Congestive Heart Failure Father   . Kidney failure Father   . Glaucoma Father   . Hypertension Brother   . Liver disease Brother   . Sickle cell trait Son   . Sickle cell trait Daughter   . Sickle cell trait Daughter     Social History   Socioeconomic History  . Marital status:  Single    Spouse name: Not on file  . Number of children: Not on file  . Years of education: Not on file  . Highest education level: Not on file  Occupational History  . Not on file  Tobacco Use  . Smoking status: Former Smoker    Packs/day: 0.50    Types: Cigarettes    Quit date: 01/03/2019    Years since quitting: 1.4  . Smokeless tobacco: Never Used   Vaping Use  . Vaping Use: Former  Substance and Sexual Activity  . Alcohol use: Yes    Comment: Occasionally   . Drug use: No  . Sexual activity: Yes  Other Topics Concern  . Not on file  Social History Narrative  . Not on file   Social Determinants of Health   Financial Resource Strain: Not on file  Food Insecurity: Not on file  Transportation Needs: Not on file  Physical Activity: Not on file  Stress: Not on file  Social Connections: Not on file     Observations/Objective: Awake, alert and oriented x 3   Review of Systems  Constitutional: Negative for fever, malaise/fatigue and weight loss.  HENT: Negative.  Negative for nosebleeds.   Eyes: Negative.  Negative for blurred vision, double vision and photophobia.  Respiratory: Positive for shortness of breath (chronic). Negative for cough.   Cardiovascular: Negative.  Negative for chest pain, palpitations and leg swelling.  Gastrointestinal: Negative.  Negative for heartburn, nausea and vomiting.  Musculoskeletal: Positive for back pain, joint pain and myalgias.  Neurological: Negative.  Negative for dizziness, focal weakness, seizures and headaches.  Psychiatric/Behavioral: Negative.  Negative for suicidal ideas.    Assessment and Plan: Diagnoses and all orders for this visit:  Essential hypertension Continue all antihypertensives as prescribed.  Remember to bring in your blood pressure log with you for your follow up appointment.  DASH/Mediterranean Diets are healthier choices for HTN.     Follow Up Instructions Return in about 3 months (around 09/29/2020).     I discussed the assessment and treatment plan with the patient. The patient was provided an opportunity to ask questions and all were answered. The patient agreed with the plan and demonstrated an understanding of the instructions.   The patient was advised to call back or seek an in-person evaluation if the symptoms worsen or if the condition fails to  improve as anticipated.  I provided 19 minutes of non-face-to-face time during this encounter including median intraservice time, reviewing previous notes, labs, imaging, medications and explaining diagnosis and management.  Teresa Pounds, FNP-BC

## 2020-06-29 NOTE — Telephone Encounter (Signed)
PA request was received from (pharmacy): [facility] Phone:[phone] Fax: [phone] Medication name and strength: Breztri 160-9-4.46mcg Ordering Provider: Hunsucker  Was PA started with CMM?: yes If yes, please enter KEY: [id number] Medication tried and failed: Symbicort, Breo, Advair  PA sent to plan, time frame for approval / denial: Outcome Approvedtoday PA Case: [id number], Status: Approved, Coverage Starts on: 06/29/2020 12:00:00 AM, Coverage Ends on: 06/29/2021 12:00:00 AM.   Bonne Dolores to call pharmacy to let them know the status of the  PA but pharmacy appeared to be closed as nobody answered. Will call pharmacy tomorrow 6/2 to make them aware that it was approved.

## 2020-07-07 ENCOUNTER — Other Ambulatory Visit: Payer: Self-pay | Admitting: Critical Care Medicine

## 2020-07-07 ENCOUNTER — Other Ambulatory Visit: Payer: Self-pay

## 2020-07-07 ENCOUNTER — Other Ambulatory Visit: Payer: Self-pay | Admitting: Nurse Practitioner

## 2020-07-07 DIAGNOSIS — D509 Iron deficiency anemia, unspecified: Secondary | ICD-10-CM

## 2020-07-07 MED FILL — Omeprazole Cap Delayed Release 20 MG: ORAL | 30 days supply | Qty: 30 | Fill #1 | Status: AC

## 2020-07-07 MED FILL — Hydrochlorothiazide Tab 25 MG: ORAL | 90 days supply | Qty: 90 | Fill #0 | Status: AC

## 2020-07-07 MED FILL — Lisinopril Tab 40 MG: ORAL | 30 days supply | Qty: 30 | Fill #0 | Status: AC

## 2020-07-07 MED FILL — Cetirizine HCl Tab 10 MG: ORAL | 30 days supply | Qty: 30 | Fill #1 | Status: AC

## 2020-07-07 NOTE — Telephone Encounter (Signed)
Called pt's pharmacy to see if pt was able to pick up Rx. Spoke with Magda Paganini to make her aware that the PA was approved and she said she would get Rx ready for pt. Nothing further needed.

## 2020-07-08 ENCOUNTER — Other Ambulatory Visit: Payer: Self-pay

## 2020-07-08 MED ORDER — MONTELUKAST SODIUM 10 MG PO TABS
ORAL_TABLET | Freq: Every day | ORAL | 11 refills | Status: DC
  Filled 2020-07-08: qty 30, 30d supply, fill #0
  Filled 2020-08-29 – 2020-09-12 (×2): qty 30, 30d supply, fill #1
  Filled 2020-11-16: qty 30, 30d supply, fill #2
  Filled 2021-01-31: qty 30, 30d supply, fill #3
  Filled 2021-02-04: qty 30, 30d supply, fill #0
  Filled 2021-03-28: qty 30, 30d supply, fill #1
  Filled 2021-06-13 – 2021-06-22 (×2): qty 30, 30d supply, fill #2

## 2020-07-08 MED ORDER — FERROUS SULFATE 325 (65 FE) MG PO TABS
325.0000 mg | ORAL_TABLET | Freq: Every day | ORAL | 2 refills | Status: DC
  Filled 2020-07-08 – 2021-02-13 (×8): qty 30, 30d supply, fill #0

## 2020-07-08 MED ORDER — ALBUTEROL SULFATE (2.5 MG/3ML) 0.083% IN NEBU
3.0000 mL | INHALATION_SOLUTION | Freq: Four times a day (QID) | RESPIRATORY_TRACT | 11 refills | Status: DC | PRN
  Filled 2020-07-08: qty 150, 13d supply, fill #0
  Filled 2020-08-29 – 2020-09-12 (×2): qty 150, 13d supply, fill #1
  Filled 2020-11-16: qty 180, 15d supply, fill #2
  Filled 2021-01-31: qty 180, 15d supply, fill #3
  Filled 2021-02-04: qty 180, 15d supply, fill #0
  Filled 2021-06-13 – 2021-06-22 (×2): qty 180, 15d supply, fill #1

## 2020-07-11 ENCOUNTER — Other Ambulatory Visit: Payer: Self-pay

## 2020-07-14 ENCOUNTER — Other Ambulatory Visit: Payer: Self-pay

## 2020-07-19 NOTE — Patient Outreach (Signed)
Medicaid Managed Care    Pharmacy Note  07/20/2020 Name: Teresa Mendez MRN: [id number] DOB: 1975-08-24  Teresa Mendez is a 45 y.o. year old female who is a primary care Teresa Mendez of Teresa Pounds, NP. The [facility] Managed Care Coordination team was consulted for assistance with disease management and care coordination needs.    Engaged with Teresa Mendez Engaged with Teresa Mendez by telephone for follow up visit in response to referral for case management and/or care coordination services.  Teresa Mendez was given information about Managed Medicaid Care Coordination team services today. Teresa Mendez agreed to services and verbal consent obtained.   Objective:  Lab Results  Component Value Date   CREATININE 0.68 03/25/2020   CREATININE 0.82 11/27/2019   CREATININE 0.84 11/06/2019    Lab Results  Component Value Date   HGBA1C 5.2 11/06/2019       Component Value Date/Time   CHOL 201 (H) 11/06/2019 1519   TRIG 113 11/06/2019 1519   HDL 47 11/06/2019 1519   CHOLHDL 4.3 11/06/2019 1519   LDLCALC 134 (H) 11/06/2019 1519    Other: (TSH, CBC, Vit D, etc.)  Clinical ASCVD: No  The 10-year ASCVD risk score Teresa Mendez DC Jr., et al., 2013) is: 9.5%   Values used to calculate the score:     Age: 89 years     Sex: Female     Is Non-Hispanic African American: Yes     Diabetic: No     Tobacco smoker: No     Systolic Blood Pressure: 852 mmHg     Is BP treated: Yes     HDL Cholesterol: 47 mg/dL     Total Cholesterol: 201 mg/dL    Other: (CHADS2VASc if Afib, PHQ9 if depression, MMRC or CAT for COPD, ACT, DEXA)  BP Readings from Last 3 Encounters:  06/20/20 (!) 160/90  05/30/20 (!) 131/93  03/25/20 110/74    Assessment/Interventions: Review of Teresa Mendez past medical history, allergies, medications, health status, including review of consultants reports, laboratory and other test data, was performed as part of comprehensive evaluation and provision of chronic care management services.     HTN Goal: <140/90 BP Readings from Last 3 Encounters:  06/20/20 (!) 160/90  05/30/20 (!) 131/93  03/25/20 110/74  -Carvedilol 3.125 -Amlodipine 10mg  -HCTZ 25mg  Lisinopril 40mg  Feb 2022 Plan: Check BP daily for 1 week. F/U and will potentially adjust therapy after 1 week of consistent data 03/24/20: BP readings are 129/81, 132/89, 149/85, 129/90, BP at goal, maintain therapy June 2022: none written down, normally 120 per Teresa Mendez if Teresa Mendez takes meds, will go to 778'E systolic if non-compliant Plan: At goal,  Teresa Mendez stable/ symptoms controlled    Lipids Goal: Moderate intensity statin and LDL<100  The 10-year ASCVD risk score Teresa Mendez DC Brooke Bonito., et al., 2013) is: 9.5%   Values used to calculate the score:     Age: 34 years     Sex: Female     Is Non-Hispanic African American: Yes     Diabetic: No     Tobacco smoker: No     Systolic Blood Pressure: 423 mmHg     Is BP treated: Yes     HDL Cholesterol: 47 mg/dL     Total Cholesterol: 201 mg/dL  Lab Results  Component Value Date   CHOL 201 (H) 11/06/2019   CHOL 146 08/22/2016   Lab Results  Component Value Date   HDL 47 11/06/2019   HDL 47 08/22/2016   Lab Results  Component  Value Date   LDLCALC 134 (H) 11/06/2019   LDLCALC 83 08/22/2016   Lab Results  Component Value Date   TRIG 113 11/06/2019   TRIG 78 08/22/2016   Lab Results  Component Value Date   CHOLHDL 4.3 11/06/2019   CHOLHDL 3.1 08/22/2016   No results found for: LDLDIRECT -Atorvastatin 80mg  June 2022: Now that Teresa Mendez's on high intensity statin, due for repeat labs. Will ask PCP    Pain Pain Scale:  -Feb 2022: Teresa Mendez didn't quantify pain but stated Teresa Mendez is suffering and wants stronger therapy -March 2022: Teresa Mendez side-stepped question and didn't fully answer -June 2022: Per Teresa Mendez last month was 9/10 but now is a 3/10 *Able to go out and walk due to new inhaler so her pain is better Duloxetine 60mg  Feb 2022 Plan: Ask PCP to try 60mg  though this  probably won't help with pain, this should help with her mother passing away 03/24/20: Teresa Mendez picked up Duloxetine 60mg , will f/u 1 month to see if improved March 2022: Teresa Mendez content on therapy, at goal, maintain therapy Plan: At goal,  Teresa Mendez stable/ symptoms controlled   GERD Omeprazole 20mg  -states no Hx of ulcers/bleeding March 2022 Plan: Taper down June 2022: Per fill Hx, is taking PRN and picking up every 2-3 months   Allergies Monteleukast Cetirizine Plan: At goal,  Teresa Mendez stable/ symptoms controlled   Insomnia Having trouble sleeping: -Maybe 3-4 hours/night -Wants to try Melatonin but can't afford -Used to work 2nd shift March 2022 Plan: Try sleep hygiene. Watches TV in bed, organizes bills in bed. Told her bed should be for 1 thing only (Sleep). June 2022: Teresa Mendez states her mind is up and racing, hard to sleep. Unable to perform GAD-7 or PHQ-9 but Teresa Mendez did admit Teresa Mendez believes anxiety could be the cause and was open to finding someone to talk with. Told Teresa Mendez to schedule f/u for 3 months with Dr. Raul Mendez (No f/u scheduled) and I'll ask PCP today to get referral in for her for counseling   COPD/Asthma -Dr. Silas Mendez -Pulmonary Sarcoidosis Tobacco Use: Medium Risk   Smoking Tobacco Use: Former   Smokeless Tobacco Use: Never  PPD: 0.5 Quite Date: 01/03/19  CAT:No flowsheet data found. 07/20/20: 22  Pulmonary Functions Testing Results:  TLC  Date Value Ref Range Status  07/03/2019 4.65 L Final   PF Readings from Last 3 Encounters:  No data found for PF   Pulmonary Functions Testing Results: PFT Results Latest Ref Rng & Units 07/03/2019  FVC-Pre L 1.89  FVC-Predicted Pre % 60  FVC-Post L 2.41  FVC-Predicted Post % 76  Pre FEV1/FVC % % 47  Post FEV1/FCV % % 50  FEV1-Pre L 0.89  FEV1-Predicted Pre % 34  FEV1-Post L 1.20  DLCO uncorrected ml/min/mmHg 17.25  DLCO UNC% % 76  DLCO corrected ml/min/mmHg 19.39  DLCO COR %Predicted % 86  DLVA Predicted %  116  TLC L 4.65  TLC % Predicted % 88  RV % Predicted % 140    Breztri Monteleukast 3 L supplemental oxygen due to dropping to 90% on 1 L at home and tachycardia. Tried/Failed: Advair June 2022: States Judithann Sauger is helping a lot (Subjectively). Conducted CAT test today but unable to find past scores to compare to. Will coordinate with Dr. Silas Mendez to try to get past values for comparison  Misc: Teresa Mendez would like obesity Dx added to chart as well as "past smoker" so Teresa Mendez can get extra benefits from "Healthy [employer]." Once those are in, will call  [phone] to make sure applied to her profile Plan: Will ask PCP    SDOH (Social Determinants of Health) assessments and interventions performed:    Care Plan  No Known Allergies  Medications Reviewed Today     Reviewed by Teresa Pounds, NP (Nurse Practitioner) on 06/29/20 at 1237  Med List Status: <None>   Medication Order Taking? Sig Documenting Provider Last Dose Status Informant  albuterol (PROVENTIL) (2.5 MG/3ML) 0.083% nebulizer solution [id number] No TAKE 3 MLS (2.5 MG TOTAL) BY NEBULIZATION EVERY 6 (SIX) HOURS AS NEEDED FOR WHEEZING OR SHORTNESS OF BREATH. [name], DO Taking Active   albuterol (VENTOLIN HFA) 108 (90 Base) MCG/ACT inhaler [id number] No INHALE 1-2 PUFFS INTO THE LUNGS EVERY 6 (SIX) HOURS AS NEEDED FOR WHEEZING OR SHORTNESS OF BREATH (COUGH). Teresa Pounds, NP Taking Active   amLODipine (NORVASC) 10 MG tablet [id number] No TAKE 1 TABLET (10 MG TOTAL) BY MOUTH DAILY. Teresa Pounds, NP Taking Active   aspirin EC 81 MG tablet [id number] No Take 81 mg by mouth daily. [provider] Taking Active   atorvastatin (LIPITOR) 80 MG tablet [id number] No TAKE 1 TABLET (80 MG TOTAL) BY MOUTH DAILY. Teresa Pounds, NP Taking Active   Budeson-Glycopyrrol-Formoterol (BREZTRI AEROSPHERE) 160-9-4.8 MCG/ACT [name] [id number]  Inhale 2 puffs into the lungs in the morning and at bedtime. Hunsucker, Bonna Gains, MD  Active    Budeson-Glycopyrrol-Formoterol (BREZTRI AEROSPHERE) 160-9-4.8 MCG/ACT [name] [id number]  Inhale 2 puffs into the lungs in the morning and at bedtime. Hunsucker, Bonna Gains, MD  Active   carvedilol (COREG) 3.125 MG tablet [id number] No TAKE 1 TABLET (3.125 MG TOTAL) BY MOUTH 2 (TWO) TIMES DAILY WITH A MEAL. Teresa Pounds, NP Taking Active   cetirizine (ZYRTEC) 10 MG tablet [id number] No TAKE 1 TABLET (10 MG TOTAL) BY MOUTH DAILY. Teresa Pounds, NP Taking Active   DULoxetine (CYMBALTA) 60 MG capsule [id number] No TAKE 1 CAPSULE (60 MG TOTAL) BY MOUTH DAILY. Teresa Pounds, NP Taking Active   famotidine (PEPCID) 20 MG tablet [id number] No Take 1 tablet (20 mg total) by mouth at bedtime. [name], NP Taking Active   ferrous sulfate 325 (65 FE) MG tablet [id number] No Take 1 tablet (325 mg total) by mouth daily with breakfast.  Teresa Mendez taking differently: Take 325 mg by mouth daily with breakfast. taking   Teresa Pounds, NP Taking Active Self  fluticasone (FLONASE) 50 MCG/ACT nasal spray [id number] No Place 2 sprays into both nostrils daily. Teresa Pounds, NP Taking Active   hydrochlorothiazide (HYDRODIURIL) 25 MG tablet [id number] No TAKE 1 TABLET (25 MG TOTAL) BY MOUTH DAILY. Teresa Pounds, NP Taking Active   lisinopril (ZESTRIL) 40 MG tablet [id number] No TAKE 1 TABLET (40 MG TOTAL) BY MOUTH DAILY. [name], MD Taking Active   Masks MISC [id number] No Nebulizer face mask J45.909 Teresa Pounds, NP Taking Active   montelukast (SINGULAIR) 10 MG tablet [id number] No TAKE 1 TABLET (10 MG TOTAL) BY MOUTH AT BEDTIME. [name], DO Taking Active   omeprazole (PRILOSEC) 20 MG capsule [id number] No TAKE 1 CAPSULE (20 MG TOTAL) BY MOUTH DAILY. Teresa Pounds, NP Taking Active   ondansetron (ZOFRAN-ODT) 4 MG disintegrating tablet [id number] No PLACE ON THE TONGUE TO DISSOLVE. DO NOT SWALLOW. TAKE ONE TABLET BY MOUTH EVERY 8 HOURS AS NEEDED FOR NAUSEA OR VOMITING. Teresa Pounds, NP  Taking Active   OXYGEN [id number] No Inhale into the lungs. 3 liters  [provider] Taking Active   potassium chloride SA (KLOR-CON) 20 MEQ tablet [id number] No TAKE 1 TABLET (20 MEQ TOTAL) BY MOUTH DAILY FOR 14 DAYS. Teresa Pounds, NP Taking Active   senna-docusate (SENOKOT-S) 8.6-50 MG tablet [id number] No TAKE 2 TABLETS BY MOUTH DAILY. Teresa Pounds, NP Taking Active             Teresa Mendez Active Problem List   Diagnosis Date Noted   Former smoker 03/24/2020   Breast nodule 03/24/2020   GERD (gastroesophageal reflux disease) 09/07/2019   Ground glass opacity present on imaging of lung 09/07/2019   Interstitial pulmonary disease ([place]) 09/07/2019   Emphysema lung ([place]) 07/03/2019   Abnormal findings on diagnostic imaging of lung 07/03/2019   Myalgia 07/03/2019   Hypertensive urgency 05/18/2019   Chest pain 05/18/2019   Acute respiratory failure with hypoxia ([place]) 05/17/2019   PULMONARY SARCOIDOSIS 07/14/2007   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 This comes on the heels of losing her brother and Uncle last year Not sure what medications are and what they're for Not taking BP consistently  Pharmacist Clinical Goal(s):  Over the next 7 days, Teresa Mendez will achieve adherence to monitoring guidelines and medication adherence to achieve therapeutic efficacy through collaboration with PharmD and provider.    Interventions: Inter-disciplinary care team collaboration (see longitudinal plan of care) Comprehensive medication review  performed; medication list updated in electronic medical record  @RXCPHYPERTENSION @ @RXCPHYPERLIPIDEMIA @  Teresa Mendez Goals/Self-Care Activities Over the next 7 days, Teresa Mendez will:  - check blood pressure Daily, document, and provide at future appointments  Follow Up Plan: The care management team will reach out to the Teresa Mendez again over the next 7 days.      Task: Mutually Develop and Royce Macadamia Achievement of Teresa Mendez Goals   Note:   Care Management Activities:    - verbalization of feelings encouraged    Notes:       Medication Assistance: None required. Teresa Mendez affirms current coverage meets needs.   Follow up: Agree/   Plan: The care management team will reach out to the Teresa Mendez again over the next 7 days.   [place] Constable, Pharm.D., Managed Medicaid Pharmacist - [phone]

## 2020-07-20 ENCOUNTER — Other Ambulatory Visit: Payer: Self-pay

## 2020-07-20 NOTE — Patient Instructions (Signed)
Visit Information  Teresa Mendez was given information about Medicaid Managed Care team care coordination services as a part of their Healthy [facility] Medicaid benefit. Teresa Mendez verbally consented to engagement with the [facility] Managed Care team.   For questions related to your Healthy [facility] health plan, please call: [phone] or visit the homepage here: [email or web address]  If you would like to schedule transportation through your Healthy [facility] plan, please call the following number at least 2 days in advance of your appointment: [phone]   Call the [place] at [phone], at any time, 24 hours a day, 7 days a week. If you are in danger or need immediate medical attention call 911.  Teresa Mendez - following are the goals we discussed in your visit today:   Goals Addressed   None     Please see education materials related to HTN provided as print materials.   Patient verbalizes understanding of instructions provided today.   The patient has been provided with contact information for the Managed Medicaid care management team and has been advised to call with any health related questions or concerns.   [place] Constable, Pharm.D., Managed Medicaid Pharmacist [phone]   Following is a copy of your plan of care:  Patient Care Plan: General Plan of Care (Adult)     Problem Identified: Health Promotion or Disease Self-Management (General Plan of Care)   Priority: Medium  Onset Date: 02/26/2020     Long-Range Goal: Self-Management Plan Developed   Start Date: 02/26/2020  Expected End Date: 05/26/2020  This Visit's Progress: On track  Priority: Medium  Note:   Current Barriers:  Chronic Disease Management support.  Nurse Case Manager Clinical Goal(s):  Over the next 90 days, patient will attend all scheduled medical appointments Over the next 30 days, patient will work with CM team pharmacist to  review medications.  Interventions:  Inter-disciplinary care team collaboration (see longitudinal plan of care) Reviewed medications with patient. Collaborated with pharmacy regarding medications. Discussed plans with patient for ongoing care management follow up and provided patient with direct contact information for care management team Reviewed scheduled/upcoming provider appointments. Pharmacy referral for medication review.  Patient Goals/Self-Care Activities Over the next 90 days, patient will:  -Self administers medications as prescribed Attends all scheduled provider appointments Calls pharmacy for medication refills Calls provider office for new concerns or questions  Follow Up Plan: The Managed Medicaid care management team will reach out to the patient again over the next 30 days.  The patient has been provided with contact information for the Managed Medicaid care management team and has been advised to call with any health related questions or concerns.        Task: Mutually Develop and Royce Macadamia Achievement of Patient Goals   Note:   Care Management Activities:    - verbalization of feelings encouraged    Notes:     Patient Care Plan: Medication Management     Problem Identified: Health Promotion or Disease Self-Management (General Plan of Care)      Goal: Medication Management   Note:   Current Barriers:  Lost mother recently and is upset about that. This comes on the heels of losing her brother and Uncle last year Not sure what medications are and what they're for Not taking BP consistently  Pharmacist Clinical Goal(s):  Over the next 7 days, patient will achieve adherence to monitoring guidelines and medication adherence to achieve therapeutic efficacy through collaboration with PharmD and provider.  Interventions: Inter-disciplinary care team collaboration (see longitudinal plan of care) Comprehensive medication review performed; medication list  updated in electronic medical record  @RXCPHYPERTENSION @ @RXCPHYPERLIPIDEMIA @  Patient Goals/Self-Care Activities Over the next 7 days, patient will:  - check blood pressure Daily, document, and provide at future appointments  Follow Up Plan: The care management team will reach out to the patient again over the next 7 days.      Task: Mutually Develop and Royce Macadamia Achievement of Patient Goals   Note:   Care Management Activities:    - verbalization of feelings encouraged    Notes:

## 2020-07-22 ENCOUNTER — Other Ambulatory Visit: Payer: Self-pay | Admitting: Obstetrics and Gynecology

## 2020-07-22 NOTE — Patient Instructions (Signed)
Hi Ms. Fobes, I am sorry I missed you today- as a part of your Medicaid benefit, you are eligible for care management and care coordination services at no cost or copay. I was unable to reach you by phone today but would be happy to help you with your health related needs. Please feel free to call me at [phone].  A member of the Managed Medicaid care management team will reach out to you again over the next 7-14 days.   Aida Raider RN, BSN [place]  [facility] Curator - Managed Medicaid High Risk [phone].

## 2020-07-22 NOTE — Patient Outreach (Signed)
Care Coordination  07/22/2020  [name] August 15, 1975 [id number]   Medicaid Managed Care   Unsuccessful Outreach Note  07/22/2020 Name: Teresa Mendez MRN: [id number] DOB: 1975-04-05  Referred by: [name], NP Reason for referral : High Risk Managed Medicaid (Unsuccessful telephone outreach)   A second unsuccessful telephone outreach was attempted today. The patient was referred to the case management team for assistance with care management and care coordination.   Follow Up Plan: The care management team will reach out to the patient again over the next 7-14 days.   Aida Raider RN, BSN [place]  [facility] Curator - Managed Medicaid High Risk [phone]

## 2020-07-29 ENCOUNTER — Other Ambulatory Visit: Payer: Self-pay | Admitting: Licensed Clinical Social Worker

## 2020-07-29 DIAGNOSIS — D869 Sarcoidosis, unspecified: Secondary | ICD-10-CM | POA: Diagnosis not present

## 2020-07-29 DIAGNOSIS — R32 Unspecified urinary incontinence: Secondary | ICD-10-CM | POA: Diagnosis not present

## 2020-07-29 DIAGNOSIS — J439 Emphysema, unspecified: Secondary | ICD-10-CM | POA: Diagnosis not present

## 2020-07-29 DIAGNOSIS — J849 Interstitial pulmonary disease, unspecified: Secondary | ICD-10-CM | POA: Diagnosis not present

## 2020-07-29 NOTE — Patient Instructions (Signed)
Visit Information  Ms. Orsino was given information about Medicaid Managed Care team care coordination services as a part of their Healthy [facility] Medicaid benefit. Valla Leaver Majeed verbally consented to engagement with the [facility] Managed Care team.   For questions related to your Healthy [facility] health plan, please call: [phone] or visit the homepage here: [email or web address]  If you would like to schedule transportation through your Healthy [facility] plan, please call the following number at least 2 days in advance of your appointment: [phone]   Call the [place] at [phone], at any time, 24 hours a day, 7 days a week. If you are in danger or need immediate medical attention call 911.  Ms. Steffey - following are the goals we discussed in your visit today:   Goals Addressed             This Visit's Progress    Track and Manage My Symptoms-Depression       Timeframe:  Long-Range Goal Priority:  High Start Date:    07/29/20                         Expected End Date:  ongoing                 Follow Up Date - 08/09/20   - avoid negative self-talk - develop a personal safety plan - develop a plan to deal with triggers like holidays, anniversaries - exercise at least 2 to 3 times per week - have a plan for how to handle bad days - journal feelings and what helps to feel better or worse - spend time or talk with others at least 2 to 3 times per week - spend time or talk with others every day - watch for early signs of feeling worse - write in journal every day    Why is this important?   Keeping track of your progress will help your treatment team find the right mix of medicine and therapy for you.  Write in your journal every day.  Day-to-day changes in depression symptoms are normal. It may be more helpful to check your progress at the end of each week instead of every day.     Notes:           [name], BSW, MSW, [employer] Managed Medicaid LCSW [email or web address] Phone: [phone]

## 2020-07-29 NOTE — Patient Outreach (Signed)
Medicaid Managed Care Social Work Note  07/29/2020 Name:  Teresa Mendez MRN:  [id number] DOB:  [birth year]  Teresa Mendez is an 45 y.o. year old female who is a primary patient of Teresa Pounds, NP.  The Medicaid Managed Care Coordination team was consulted for assistance with:  [place] and Resources  [name] was given information about Medicaid Managed CareCoordination services today. Teresa Mendez agreed to services and verbal consent obtained.  Engaged with patient  for by telephone forinitial visit in response to referral for case management and/or care coordination services.   Assessments/Interventions:  Review of past medical history, allergies, medications, health status, including review of consultants reports, laboratory and other test data, was performed as part of comprehensive evaluation and provision of chronic care management services.  SDOH: (Social Determinant of Health) assessments and interventions performed: SDOH Interventions    Flowsheet Row Most Recent Value  SDOH Interventions   SDOH Interventions for the Following Domains Depression  Depression Interventions/Treatment  Patient refuses Treatment       Advanced Directives Status:  See Care Plan for related entries.  Care Plan                 No Known Allergies  Medications Reviewed Today     Reviewed by Teresa Pounds, NP (Nurse Practitioner) on 06/29/20 at 1237  Med List Status: <None>   Medication Order Taking? Sig Documenting Provider Last Dose Status Informant  albuterol (PROVENTIL) (2.5 MG/3ML) 0.083% nebulizer solution [id number] No TAKE 3 MLS (2.5 MG TOTAL) BY NEBULIZATION EVERY 6 (SIX) HOURS AS NEEDED FOR WHEEZING OR SHORTNESS OF BREATH. Teresa Hy, DO Taking Active   albuterol (VENTOLIN HFA) 108 (90 Base) MCG/ACT inhaler [id number] No INHALE 1-2 PUFFS INTO THE LUNGS EVERY 6 (SIX) HOURS AS NEEDED FOR WHEEZING OR SHORTNESS OF BREATH (COUGH). Teresa Pounds, NP Taking Active    amLODipine (NORVASC) 10 MG tablet [id number] No TAKE 1 TABLET (10 MG TOTAL) BY MOUTH DAILY. Teresa Pounds, NP Taking Active   aspirin EC 81 MG tablet [id number] No Take 81 mg by mouth daily. [provider] Taking Active   atorvastatin (LIPITOR) 80 MG tablet [id number] No TAKE 1 TABLET (80 MG TOTAL) BY MOUTH DAILY. Teresa Pounds, NP Taking Active   Budeson-Glycopyrrol-Formoterol (BREZTRI AEROSPHERE) 160-9-4.8 MCG/ACT Teresa Mendez [id number]  Inhale 2 puffs into the lungs in the morning and at bedtime. Mendez, Teresa Gains, MD  Active   Budeson-Glycopyrrol-Formoterol (BREZTRI AEROSPHERE) 160-9-4.8 MCG/ACT Teresa Mendez [id number]  Inhale 2 puffs into the lungs in the morning and at bedtime. Mendez, Teresa Gains, MD  Active   carvedilol (COREG) 3.125 MG tablet [id number] No TAKE 1 TABLET (3.125 MG TOTAL) BY MOUTH 2 (TWO) TIMES DAILY WITH A MEAL. Teresa Pounds, NP Taking Active   cetirizine (ZYRTEC) 10 MG tablet [id number] No TAKE 1 TABLET (10 MG TOTAL) BY MOUTH DAILY. Teresa Pounds, NP Taking Active   DULoxetine (CYMBALTA) 60 MG capsule [id number] No TAKE 1 CAPSULE (60 MG TOTAL) BY MOUTH DAILY. Teresa Pounds, NP Taking Active   famotidine (PEPCID) 20 MG tablet [id number] No Take 1 tablet (20 mg total) by mouth at bedtime. Teresa Ehrich, NP Taking Active   ferrous sulfate 325 (65 FE) MG tablet [id number] No Take 1 tablet (325 mg total) by mouth daily with breakfast.  Patient taking differently: Take 325 mg by mouth daily with breakfast. taking   Teresa Pounds, NP Taking Active Self  fluticasone (FLONASE)  50 MCG/ACT nasal spray [id number] No Place 2 sprays into both nostrils daily. Teresa Pounds, NP Taking Active   hydrochlorothiazide (HYDRODIURIL) 25 MG tablet [id number] No TAKE 1 TABLET (25 MG TOTAL) BY MOUTH DAILY. Teresa Pounds, NP Taking Active   lisinopril (ZESTRIL) 40 MG tablet [id number] No TAKE 1 TABLET (40 MG TOTAL) BY MOUTH DAILY. Teresa Rakes, MD Taking Active   Masks MISC  [id number] No Nebulizer face mask J45.909 Teresa Pounds, NP Taking Active   montelukast (SINGULAIR) 10 MG tablet [id number] No TAKE 1 TABLET (10 MG TOTAL) BY MOUTH AT BEDTIME. Teresa Hy, DO Taking Active   omeprazole (PRILOSEC) 20 MG capsule [id number] No TAKE 1 CAPSULE (20 MG TOTAL) BY MOUTH DAILY. Teresa Pounds, NP Taking Active   ondansetron (ZOFRAN-ODT) 4 MG disintegrating tablet [id number] No PLACE ON THE TONGUE TO DISSOLVE. DO NOT SWALLOW. TAKE ONE TABLET BY MOUTH EVERY 8 HOURS AS NEEDED FOR NAUSEA OR VOMITING. Teresa Pounds, NP Taking Active   OXYGEN [id number] No Inhale into the lungs. 3 liters [provider] Taking Active   potassium chloride SA (KLOR-CON) 20 MEQ tablet [id number] No TAKE 1 TABLET (20 MEQ TOTAL) BY MOUTH DAILY FOR 14 DAYS. Teresa Pounds, NP Taking Active   senna-docusate (SENOKOT-S) 8.6-50 MG tablet [id number] No TAKE 2 TABLETS BY MOUTH DAILY. 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   Essential hypertension 06/27/2007   DYSPNEA 06/27/2007    Conditions to be addressed/monitored per PCP order:  Anxiety and Depression  Care Plan : LCSW plan of care  Updates made by Teresa Cutter, LCSW since 07/29/2020 12:00 AM     Problem: Depression Identification (Depression)      Long-Range Goal: Depressive Symptoms Identified   Start Date: 07/29/2020  Priority: High   Note:   Timeframe:  Long-Range Goal Priority:  High Start Date:    07/29/20                         Expected End Date:  ongoing                 Follow Up Date - 08/09/20   Current barriers:   Chronic Mental Health needs related to Stress, anxiety and depression Mental Health Concerns  Needs Support, Education, and Care Coordination in order to meet unmet mental health needs. Clinical Goal(s): Within the next 120 days, patient will work with [facility] LCSW to gain coping skill education to decrease depression and anxiety and will determine if she willing to gain a referral to therapy and medication management. Clinical Interventions:  Assessed patient's previous and current treatment, coping skills, support system and barriers to care  Patient reports that she is not sure if she wants a referral for mental health support and would like some time to consider this offer. [facility] LCSW will follow  up with patient on 08/09/20. Patient is appreciative of this.  Review various resources, discussed options and provided patient information about Discussed several options for long term counseling based on need and insurance Depression screen reviewed , Solution-Focused Strategies, Mindfulness or Relaxation Training, Active listening / Reflection utilized , Psychoeducation for mental health needs , Brief CBT , Participation in counseling encouraged , and Suicidal Ideation/Homicidal Ideation assessed: ; Patient interviewed and appropriate assessments performed Discussed plans with patient for ongoing care management follow up and provided patient with direct contact information for care management team Assisted patient/caregiver with obtaining information about health plan benefits Provided education and assistance to client regarding Advanced Directives. Discussed several options for long term counseling based on need and insurance.  Assisted patient with narrowing the options down to [facility]  ) Collaboration with PCP regarding development and update of comprehensive plan of care as evidenced by provider attestation and co-signature Inter-disciplinary care team collaboration (see longitudinal plan of care) Patient Goals/Self-Care Activities: Over the next 120 days - barriers to treatment adherence identified - complementary therapy use encouraged - counseling provided - depression screen reviewed - self-awareness of emotional triggers encouraged - strategies to manage emotional triggers promoted - suicide risk screen reviewed - avoid negative self-talk - develop a personal safety plan - develop a plan to deal with triggers like holidays, anniversaries - exercise at least 2 to 3 times per week - have a plan for how to handle bad days - journal feelings and what helps to feel better or worse - spend time or talk with others at least 2 to 3 times per week - spend time or talk with others every day - watch for early signs of feeling worse - write in journal every day - begin personal counseling - call and visit an old friend - check out volunteer opportunities - join a support group - laugh; watch a funny movie or comedian - learn and use visualization or guided imagery - perform a random act of kindness - practice relaxation or meditation daily - start or continue a personal journal - talk about feelings with a friend, family or spiritual advisor - practice positive thinking and self-talk Continue with compliance of taking medication        Follow up:  Patient agrees to Care Plan and Follow-up.  Plan: The Managed Medicaid care management team will reach out to the patient again over the next 14 days.  Date of next scheduled Social Work care management/care coordination outreach:  08/09/20  [name], BSW, MSW, LCSW Managed Medicaid LCSW [email or web address] Phone: [phone]

## 2020-08-09 ENCOUNTER — Ambulatory Visit: Payer: Self-pay

## 2020-08-09 ENCOUNTER — Telehealth: Payer: Self-pay | Admitting: Licensed Clinical Social Worker

## 2020-08-09 NOTE — Patient Outreach (Signed)
  [facility]) Care Management  [facility] Social Work  08/09/2020  ARES TEGTMEYER 03-02-1975 [id number]   Encounter Medications:  Outpatient Encounter Medications as of 08/09/2020  Medication Sig   albuterol (PROVENTIL) (2.5 MG/3ML) 0.083% nebulizer solution TAKE 3 MLS (2.5 MG TOTAL) BY NEBULIZATION EVERY 6 (SIX) HOURS AS NEEDED FOR WHEEZING OR SHORTNESS OF BREATH.   albuterol (VENTOLIN HFA) 108 (90 Base) MCG/ACT inhaler INHALE 1-2 PUFFS INTO THE LUNGS EVERY 6 (SIX) HOURS AS NEEDED FOR WHEEZING OR SHORTNESS OF BREATH (COUGH).   amLODipine (NORVASC) 10 MG tablet TAKE 1 TABLET (10 MG TOTAL) BY MOUTH DAILY.   aspirin EC 81 MG tablet Take 81 mg by mouth daily.   atorvastatin (LIPITOR) 80 MG tablet TAKE 1 TABLET (80 MG TOTAL) BY MOUTH DAILY.   Budeson-Glycopyrrol-Formoterol (BREZTRI AEROSPHERE) 160-9-4.8 MCG/ACT AERO Inhale 2 puffs into the lungs in the morning and at bedtime.   Budeson-Glycopyrrol-Formoterol (BREZTRI AEROSPHERE) 160-9-4.8 MCG/ACT AERO Inhale 2 puffs into the lungs in the morning and at bedtime.   carvedilol (COREG) 3.125 MG tablet TAKE 1 TABLET (3.125 MG TOTAL) BY MOUTH 2 (TWO) TIMES DAILY WITH A MEAL.   cetirizine (ZYRTEC) 10 MG tablet TAKE 1 TABLET (10 MG TOTAL) BY MOUTH DAILY.   DULoxetine (CYMBALTA) 60 MG capsule TAKE 1 CAPSULE (60 MG TOTAL) BY MOUTH DAILY.   famotidine (PEPCID) 20 MG tablet Take 1 tablet (20 mg total) by mouth at bedtime.   ferrous sulfate 325 (65 FE) MG tablet Take 1 tablet (325 mg total) by mouth daily with breakfast.   fluticasone (FLONASE) 50 MCG/ACT nasal spray Place 2 sprays into both nostrils daily.   hydrochlorothiazide (HYDRODIURIL) 25 MG tablet TAKE 1 TABLET (25 MG TOTAL) BY MOUTH DAILY.   lisinopril (ZESTRIL) 40 MG tablet TAKE 1 TABLET (40 MG TOTAL) BY MOUTH DAILY.   Masks MISC Nebulizer face mask J45.909   montelukast (SINGULAIR) 10 MG tablet TAKE 1 TABLET (10 MG TOTAL) BY MOUTH AT BEDTIME.   omeprazole (PRILOSEC) 20 MG capsule TAKE  1 CAPSULE (20 MG TOTAL) BY MOUTH DAILY.   ondansetron (ZOFRAN-ODT) 4 MG disintegrating tablet PLACE ON THE TONGUE TO DISSOLVE. DO NOT SWALLOW. TAKE ONE TABLET BY MOUTH EVERY 8 HOURS AS NEEDED FOR NAUSEA OR VOMITING.   OXYGEN Inhale into the lungs. 3 liters   potassium chloride SA (KLOR-CON) 20 MEQ tablet TAKE 1 TABLET (20 MEQ TOTAL) BY MOUTH DAILY FOR 14 DAYS.   senna-docusate (SENOKOT-S) 8.6-50 MG tablet TAKE 2 TABLETS BY MOUTH DAILY.   No facility-administered encounter medications on file as of 08/09/2020.    Functional Status:  No flowsheet data found.  Fall/Depression Screening:  PHQ 2/9 Scores 06/03/2019 08/22/2018 04/24/2017 08/22/2016  PHQ - 2 Score 0 0 0 0  PHQ- 9 Score - 0 3 6    Assessment:  Care Plan There are no care plans that you recently modified to display for this patient.    Goals Addressed   None    LCSW completed [facility] outreach attempt today but was unable to reach patient successfully. A HIPPA compliant voice message was unable to be left encouraging patient to return call once available. LCSW will reschedule patient's [facility] Social Work appointment if no return call has been made.  [name], BSW, MSW, [employer] Managed Medicaid LCSW [email or web address] Phone: [phone]

## 2020-08-09 NOTE — Patient Instructions (Signed)
Teresa Mendez ,   The [facility] Managed Care Team is available to provide assistance to you with your healthcare needs at no cost and as a benefit of your [facility] Health plan. Please reach out to me at the number below. I am available to be of assistance to you regarding your healthcare needs. .   Thank you,   [name], BSW, MSW, LCSW Managed Medicaid LCSW [email or web address] Phone: [phone]

## 2020-08-15 DIAGNOSIS — J189 Pneumonia, unspecified organism: Secondary | ICD-10-CM | POA: Diagnosis not present

## 2020-08-15 DIAGNOSIS — D869 Sarcoidosis, unspecified: Secondary | ICD-10-CM | POA: Diagnosis not present

## 2020-08-15 DIAGNOSIS — J45909 Unspecified asthma, uncomplicated: Secondary | ICD-10-CM | POA: Diagnosis not present

## 2020-08-29 ENCOUNTER — Other Ambulatory Visit: Payer: Self-pay | Admitting: Nurse Practitioner

## 2020-08-29 ENCOUNTER — Other Ambulatory Visit: Payer: Self-pay | Admitting: Family Medicine

## 2020-08-29 ENCOUNTER — Other Ambulatory Visit: Payer: Self-pay

## 2020-08-29 DIAGNOSIS — D869 Sarcoidosis, unspecified: Secondary | ICD-10-CM | POA: Diagnosis not present

## 2020-08-29 DIAGNOSIS — J439 Emphysema, unspecified: Secondary | ICD-10-CM | POA: Diagnosis not present

## 2020-08-29 DIAGNOSIS — R32 Unspecified urinary incontinence: Secondary | ICD-10-CM | POA: Diagnosis not present

## 2020-08-29 DIAGNOSIS — J849 Interstitial pulmonary disease, unspecified: Secondary | ICD-10-CM | POA: Diagnosis not present

## 2020-08-29 MED ORDER — ALBUTEROL SULFATE HFA 108 (90 BASE) MCG/ACT IN AERS
INHALATION_SPRAY | RESPIRATORY_TRACT | 0 refills | Status: DC
Start: 2020-08-29 — End: 2021-03-28
  Filled 2020-08-29 – 2020-09-12 (×2): qty 8.5, 25d supply, fill #0

## 2020-08-29 MED ORDER — ATORVASTATIN CALCIUM 80 MG PO TABS
ORAL_TABLET | Freq: Every day | ORAL | 0 refills | Status: DC
  Filled 2020-08-29 – 2020-09-12 (×2): qty 90, 90d supply, fill #0

## 2020-08-29 MED ORDER — MASKS MISC: 0 refills | Status: DC

## 2020-08-29 MED ORDER — LISINOPRIL 40 MG PO TABS
ORAL_TABLET | Freq: Every day | ORAL | 1 refills | Status: DC
  Filled 2020-08-29: qty 30, 30d supply, fill #0
  Filled 2020-09-12: qty 60, 60d supply, fill #0
  Filled 2021-01-31: qty 60, 60d supply, fill #1
  Filled 2021-03-28: qty 60, 60d supply, fill #0

## 2020-08-29 MED ORDER — POTASSIUM CHLORIDE CRYS ER 20 MEQ PO TBCR
EXTENDED_RELEASE_TABLET | ORAL | 0 refills | Status: DC
  Filled 2020-08-29 – 2020-09-12 (×2): qty 14, 14d supply, fill #0

## 2020-08-29 MED ORDER — DULOXETINE HCL 60 MG PO CPEP
ORAL_CAPSULE | Freq: Every day | ORAL | 0 refills | Status: DC
  Filled 2020-08-29 – 2020-09-12 (×2): qty 90, 90d supply, fill #0

## 2020-08-29 MED FILL — Amlodipine Besylate Tab 10 MG (Base Equivalent): ORAL | 90 days supply | Qty: 90 | Fill #0 | Status: CN

## 2020-08-29 MED FILL — Cetirizine HCl Tab 10 MG: ORAL | 30 days supply | Qty: 30 | Fill #2 | Status: CN

## 2020-08-29 MED FILL — Hydrochlorothiazide Tab 25 MG: ORAL | 90 days supply | Qty: 90 | Fill #1 | Status: CN

## 2020-08-29 MED FILL — Ondansetron Orally Disintegrating Tab 4 MG: ORAL | 13 days supply | Qty: 40 | Fill #0 | Status: CN

## 2020-08-29 MED FILL — Omeprazole Cap Delayed Release 20 MG: ORAL | 30 days supply | Qty: 30 | Fill #2 | Status: CN

## 2020-08-29 MED FILL — Sennosides-Docusate Sodium Tab 8.6-50 MG: ORAL | 30 days supply | Qty: 60 | Fill #0 | Status: CN

## 2020-08-29 MED FILL — Carvedilol Tab 3.125 MG: ORAL | 90 days supply | Qty: 180 | Fill #0 | Status: CN

## 2020-08-29 NOTE — Telephone Encounter (Signed)
Requested medication (s) are due for refill today: No  Requested medication (s) are on the active medication list: Yes  Last refill:  03/27/20  Future visit scheduled: No  Notes to clinic:  See request.    Requested Prescriptions  Pending Prescriptions Disp Refills   potassium chloride SA (KLOR-CON) 20 MEQ tablet 14 tablet 0    Sig: TAKE 1 TABLET (20 MEQ TOTAL) BY MOUTH DAILY FOR 14 DAYS.      Endocrinology:  Minerals - Potassium Supplementation Failed - 08/29/2020 11:51 AM      Failed - K in normal range and within 360 days    Potassium  Date Value Ref Range Status  03/25/2020 3.2 (L) 3.5 - 5.2 mmol/L Final          Passed - Cr in normal range and within 360 days    Creat  Date Value Ref Range Status  07/03/2019 0.85 0.50 - 1.10 mg/dL Final   Creatinine, Ser  Date Value Ref Range Status  03/25/2020 0.68 0.57 - 1.00 mg/dL Final    Comment:                   **Effective March 28, 2020 [facility] will begin**                  reporting the 2021 CKD-EPI creatinine equation that                  estimates kidney function without a race variable.    Creatinine, POC  Date Value Ref Range Status  08/22/2016 200 mg/dL Final          Passed - Valid encounter within last 12 months    Recent Outpatient Visits           2 months ago Essential hypertension   [place], Vernia Buff, NP   5 months ago Essential hypertension   [place], Vernia Buff, NP   9 months ago Essential hypertension   [place], Vernia Buff, NP   1 year ago Pressure sensation in right ear   [name], NP   1 year ago Essential hypertension   [place], Levada Dy M, [place]                 Signed Prescriptions Disp Refills   albuterol (PROAIR HFA) 108 (90 Base) MCG/ACT inhaler 8.5 g 0    Sig: INHALE 1-2  PUFFS INTO THE LUNGS EVERY 6 (SIX) HOURS AS NEEDED FOR WHEEZING OR SHORTNESS OF BREATH (COUGH).      Pulmonology:  Beta Agonists Failed - 08/29/2020 11:51 AM      Failed - One inhaler should last at least one month. If the patient is requesting refills earlier, contact the patient to check for uncontrolled symptoms.      Passed - Valid encounter within last 12 months    Recent Outpatient Visits           2 months ago Essential hypertension   [place], Vernia Buff, NP   5 months ago Essential hypertension   [place], NP   9 months ago Essential hypertension   [place], Vernia Buff, NP   1 year ago Pressure sensation in right ear   [place], [place] W, NP   1 year ago Essential hypertension   [place], [place]                  DULoxetine (CYMBALTA) 60 MG capsule 90 capsule 0    Sig: TAKE 1 CAPSULE (60 MG TOTAL) BY MOUTH DAILY.      Psychiatry: Antidepressants - SNRI Failed - 08/29/2020 11:51 AM      Failed - Last BP in normal range    BP Readings from Last 1 Encounters:  06/20/20 (!) 160/90          Passed - Valid encounter within last 6 months    Recent Outpatient Visits           2 months ago Essential hypertension   [place], Vernia Buff, NP   5 months ago Essential hypertension   [place], Vernia Buff, NP   9 months ago Essential hypertension   [place], Vernia Buff, NP   1 year ago Pressure sensation in right ear   [place], [place] W, NP   1 year ago Essential hypertension   [place], [place]                  atorvastatin (LIPITOR) 80 MG tablet 90  tablet 0    Sig: TAKE 1 TABLET (80 MG TOTAL) BY MOUTH DAILY.      Cardiovascular:  Antilipid - Statins Failed - 08/29/2020 11:51 AM      Failed - Total Cholesterol in normal range and within 360 days    Cholesterol, Total  Date Value Ref Range Status  11/06/2019 201 (H) 100 - 199 mg/dL Final          Failed - LDL in normal range and within 360 days    LDL Chol Calc (NIH)  Date Value Ref Range Status  11/06/2019 134 (H) 0 - 99 mg/dL Final          Passed - HDL in normal range and within 360 days    HDL  Date Value Ref Range Status  11/06/2019 47 >39 mg/dL Final          Passed - Triglycerides in normal range and within 360 days    Triglycerides  Date Value Ref Range Status  11/06/2019 113 0 - 149 mg/dL Final          Passed - Patient is not pregnant      Passed - Valid encounter within last 12 months    Recent Outpatient Visits           2 months ago Essential hypertension   [place], Vernia Buff, NP   5 months ago Essential hypertension   [place], Vernia Buff, NP   9 months ago Essential hypertension   [place], Vernia Buff, NP   1 year ago Pressure sensation in right ear   [place], Vernia Buff, NP   1 year ago Essential hypertension   [place], [place], [place]

## 2020-08-29 NOTE — Telephone Encounter (Signed)
Requested Prescriptions  Pending Prescriptions Disp Refills  . lisinopril (ZESTRIL) 40 MG tablet 60 tablet 1    Sig: TAKE 1 TABLET (40 MG TOTAL) BY MOUTH DAILY.     Cardiovascular:  ACE Inhibitors Failed - 08/29/2020 11:51 AM      Failed - K in normal range and within 180 days    Potassium  Date Value Ref Range Status  03/25/2020 3.2 (L) 3.5 - 5.2 mmol/L Final         Failed - Last BP in normal range    BP Readings from Last 1 Encounters:  06/20/20 (!) 160/90         Passed - Cr in normal range and within 180 days    Creat  Date Value Ref Range Status  07/03/2019 0.85 0.50 - 1.10 mg/dL Final   Creatinine, Ser  Date Value Ref Range Status  03/25/2020 0.68 0.57 - 1.00 mg/dL Final    Comment:                   **Effective March 28, 2020 [facility] will begin**                  reporting the 2021 CKD-EPI creatinine equation that                  estimates kidney function without a race variable.    Creatinine, POC  Date Value Ref Range Status  08/22/2016 200 mg/dL Final         Passed - Patient is not pregnant      Passed - Valid encounter within last 6 months    Recent Outpatient Visits          2 months ago Essential hypertension   [place], Vernia Buff, NP   5 months ago Essential hypertension   [place] [name], NP   9 months ago Essential hypertension   [place], Vernia Buff, NP   1 year ago Pressure sensation in right ear   [place], Zelda W, NP   1 year ago Essential hypertension   [place], [place], [place]

## 2020-08-30 ENCOUNTER — Other Ambulatory Visit: Payer: Self-pay

## 2020-08-30 ENCOUNTER — Telehealth: Payer: Self-pay | Admitting: Licensed Clinical Social Worker

## 2020-08-30 ENCOUNTER — Ambulatory Visit: Payer: Self-pay

## 2020-08-30 NOTE — Patient Outreach (Signed)
  [facility]) Care Management  [facility] Social Work  08/30/2020  WARNETTA SHANDOR 1975/08/13 [id number]  Encounter Medications:  Outpatient Encounter Medications as of 08/30/2020  Medication Sig   albuterol (PROAIR HFA) 108 (90 Base) MCG/ACT inhaler INHALE 1-2 PUFFS INTO THE LUNGS EVERY 6 (SIX) HOURS AS NEEDED FOR WHEEZING OR SHORTNESS OF BREATH (COUGH).   albuterol (PROVENTIL) (2.5 MG/3ML) 0.083% nebulizer solution TAKE 3 MLS (2.5 MG TOTAL) BY NEBULIZATION EVERY 6 (SIX) HOURS AS NEEDED FOR WHEEZING OR SHORTNESS OF BREATH.   amLODipine (NORVASC) 10 MG tablet TAKE 1 TABLET (10 MG TOTAL) BY MOUTH DAILY.   aspirin EC 81 MG tablet Take 81 mg by mouth daily.   atorvastatin (LIPITOR) 80 MG tablet TAKE 1 TABLET (80 MG TOTAL) BY MOUTH DAILY.   Budeson-Glycopyrrol-Formoterol (BREZTRI AEROSPHERE) 160-9-4.8 MCG/ACT AERO Inhale 2 puffs into the lungs in the morning and at bedtime.   Budeson-Glycopyrrol-Formoterol (BREZTRI AEROSPHERE) 160-9-4.8 MCG/ACT AERO Inhale 2 puffs into the lungs in the morning and at bedtime.   carvedilol (COREG) 3.125 MG tablet TAKE 1 TABLET (3.125 MG TOTAL) BY MOUTH 2 (TWO) TIMES DAILY WITH A MEAL.   cetirizine (ZYRTEC) 10 MG tablet TAKE 1 TABLET (10 MG TOTAL) BY MOUTH DAILY.   DULoxetine (CYMBALTA) 60 MG capsule TAKE 1 CAPSULE (60 MG TOTAL) BY MOUTH DAILY.   famotidine (PEPCID) 20 MG tablet Take 1 tablet (20 mg total) by mouth at bedtime.   ferrous sulfate 325 (65 FE) MG tablet Take 1 tablet (325 mg total) by mouth daily with breakfast.   fluticasone (FLONASE) 50 MCG/ACT nasal spray Place 2 sprays into both nostrils daily.   hydrochlorothiazide (HYDRODIURIL) 25 MG tablet TAKE 1 TABLET (25 MG TOTAL) BY MOUTH DAILY.   lisinopril (ZESTRIL) 40 MG tablet TAKE 1 TABLET (40 MG TOTAL) BY MOUTH DAILY.   Masks MISC Nebulizer face mask J45.909   montelukast (SINGULAIR) 10 MG tablet TAKE 1 TABLET (10 MG TOTAL) BY MOUTH AT BEDTIME.   omeprazole (PRILOSEC) 20 MG capsule TAKE 1  CAPSULE (20 MG TOTAL) BY MOUTH DAILY.   ondansetron (ZOFRAN-ODT) 4 MG disintegrating tablet PLACE ON THE TONGUE TO DISSOLVE. DO NOT SWALLOW. TAKE ONE TABLET BY MOUTH EVERY 8 HOURS AS NEEDED FOR NAUSEA OR VOMITING.   OXYGEN Inhale into the lungs. 3 liters   potassium chloride SA (KLOR-CON) 20 MEQ tablet TAKE 1 TABLET (20 MEQ TOTAL) BY MOUTH DAILY FOR 14 DAYS.   senna-docusate (SENOKOT-S) 8.6-50 MG tablet TAKE 2 TABLETS BY MOUTH DAILY.   No facility-administered encounter medications on file as of 08/30/2020.    Functional Status:  No flowsheet data found.  Fall/Depression Screening:  PHQ 2/9 Scores 06/03/2019 08/22/2018 04/24/2017 08/22/2016  PHQ - 2 Score 0 0 0 0  PHQ- 9 Score - 0 3 6    Assessment:  Care Plan There are no care plans that you recently modified to display for this patient.    Goals Addressed   None    LCSW completed [facility] outreach attempt today but was unable to reach patient successfully. A HIPPA compliant voice message was left encouraging patient to return call once available. LCSW will reschedule patient's [facility] Social Work appointment if no return call has been made.  [name], BSW, MSW, [employer] Managed Medicaid LCSW [email or web address] Phone: [phone]

## 2020-09-06 ENCOUNTER — Other Ambulatory Visit: Payer: Self-pay

## 2020-09-07 ENCOUNTER — Other Ambulatory Visit: Payer: Self-pay | Admitting: Licensed Clinical Social Worker

## 2020-09-07 DIAGNOSIS — F321 Major depressive disorder, single episode, moderate: Secondary | ICD-10-CM

## 2020-09-07 DIAGNOSIS — F4321 Adjustment disorder with depressed mood: Secondary | ICD-10-CM

## 2020-09-07 NOTE — Patient Instructions (Signed)
Visit Information  Ms. Gandolfi was given information about Medicaid Managed Care team care coordination services as a part of their Healthy [facility] Medicaid benefit. Valla Leaver Mata verbally consented to engagement with the [facility] Managed Care team.   If you are experiencing a medical emergency, please call 911 or report to your local emergency department or urgent care.   If you have a non-emergency medical problem during routine business hours, please contact your provider's office and ask to speak with a nurse.   For questions related to your Healthy [facility] health plan, please call: [phone] or visit the homepage here: [email or web address]  If you would like to schedule transportation through your Healthy [facility] plan, please call the following number at least 2 days in advance of your appointment: [phone]  Call the [place] at [phone], at any time, 24 hours a day, 7 days a week. If you are in danger or need immediate medical attention call 911.  If you would like help to quit smoking, call 1-800-QUIT-NOW 517-341-7553) OR Espaol: 1-855-Djelo-Ya QO:409462) o para ms informacin haga clic aqu or Text READY to 200-400 to register via text  Ms. Vine - following are the goals we discussed in your visit today:   Goals Addressed             This Visit's Progress    Track and Manage My Symptoms-Depression       Timeframe:  Long-Range Goal Priority:  High Start Date:    07/29/20                         Expected End Date:  ongoing                 Follow Up Date - 09/29/20   - avoid negative self-talk - develop a personal safety plan - develop a plan to deal with triggers like holidays, anniversaries - exercise at least 2 to 3 times per week - have a plan for how to handle bad days - journal feelings and what helps to feel better or worse - spend time or talk with others at least 2 to 3 times per  week - spend time or talk with others every day - watch for early signs of feeling worse - write in journal every day    Why is this important?   Keeping track of your progress will help your treatment team find the right mix of medicine and therapy for you.  Write in your journal every day.  Day-to-day changes in depression symptoms are normal. It may be more helpful to check your progress at the end of each week instead of every day.     Notes:        [name], BSW, MSW, [employer] Managed Medicaid LCSW [email or web address] Phone: [phone]

## 2020-09-07 NOTE — Patient Outreach (Signed)
Medicaid Managed Care Social Work Note  09/07/2020 Name:  Teresa Mendez MRN:  [id number] DOB:  1975-11-29  [name] is an 45 y.o. year old female who is a primary patient of [name], NP.  The Medicaid Managed Care Coordination team was consulted for assistance with:  [place] and Resources  Teresa Mendez was given information about Medicaid Managed Care Coordination team services today. Teresa Mendez Patient agreed to services and verbal consent obtained.  Engaged with patient  for by telephone forfollow up visit in response to referral for case management and/or care coordination services.   Assessments/Interventions:  Review of past medical history, allergies, medications, health status, including review of consultants reports, laboratory and other test data, was performed as part of comprehensive evaluation and provision of chronic care management services.  SDOH: (Social Determinant of Health) assessments and interventions performed: SDOH Interventions    Flowsheet Row Most Recent Value  SDOH Interventions   SDOH Interventions for the Following Domains Stress  Stress Interventions Provide Counseling  Depression Interventions/Treatment  Referral to Psychiatry       Advanced Directives Status:  See Care Plan for related entries.  Care Plan                 No Known Allergies  Medications Reviewed Today     Reviewed by Greg Cutter, LCSW (Social Worker) on 09/07/20 at 51  Med List Status: <None>   Medication Order Taking? Sig Documenting Provider Last Dose Status Informant  albuterol (PROAIR HFA) 108 (90 Base) MCG/ACT inhaler [id number]  INHALE 1-2 PUFFS INTO THE LUNGS EVERY 6 (SIX) HOURS AS NEEDED FOR WHEEZING OR SHORTNESS OF BREATH (COUGH). [name], NP  Active   albuterol (PROVENTIL) (2.5 MG/3ML) 0.083% nebulizer solution [id number]  TAKE 3 MLS (2.5 MG TOTAL) BY NEBULIZATION EVERY 6 (SIX) HOURS AS NEEDED FOR WHEEZING OR SHORTNESS OF  BREATH. Hunsucker, Bonna Gains, MD  Active   amLODipine (NORVASC) 10 MG tablet [id number] No TAKE 1 TABLET (10 MG TOTAL) BY MOUTH DAILY. [name], NP Taking Active   aspirin EC 81 MG tablet [id number] No Take 81 mg by mouth daily. [provider] Taking Active   atorvastatin (LIPITOR) 80 MG tablet [id number]  TAKE 1 TABLET (80 MG TOTAL) BY MOUTH DAILY. [name], NP  Active   Budeson-Glycopyrrol-Formoterol (BREZTRI AEROSPHERE) 160-9-4.8 MCG/ACT [name] [id number]  Inhale 2 puffs into the lungs in the morning and at bedtime. Hunsucker, Bonna Gains, MD  Active   Budeson-Glycopyrrol-Formoterol (BREZTRI AEROSPHERE) 160-9-4.8 MCG/ACT [name] [id number]  Inhale 2 puffs into the lungs in the morning and at bedtime. Hunsucker, Bonna Gains, MD  Active   carvedilol (COREG) 3.125 MG tablet [id number] No TAKE 1 TABLET (3.125 MG TOTAL) BY MOUTH 2 (TWO) TIMES DAILY WITH A MEAL. [name], NP Taking Active   cetirizine (ZYRTEC) 10 MG tablet [id number] No TAKE 1 TABLET (10 MG TOTAL) BY MOUTH DAILY. [name], NP Taking Active   DULoxetine (CYMBALTA) 60 MG capsule [id number]  TAKE 1 CAPSULE (60 MG TOTAL) BY MOUTH DAILY. [name], NP  Active   famotidine (PEPCID) 20 MG tablet [id number] No Take 1 tablet (20 mg total) by mouth at bedtime. [name], NP Taking Active   ferrous sulfate 325 (65 FE) MG tablet [id number]  Take 1 tablet (325 mg total) by mouth daily with breakfast. [name], NP  Expired 08/07/20 2359   fluticasone (FLONASE) 50 MCG/ACT nasal spray  [id number] No Place 2 sprays into both nostrils daily. [name], NP Taking Active   hydrochlorothiazide (HYDRODIURIL) 25 MG tablet [id number] No TAKE 1 TABLET (25 MG TOTAL) BY MOUTH DAILY. [name], NP Taking Active   lisinopril (ZESTRIL) 40 MG tablet [id number]  TAKE 1 TABLET (40 MG TOTAL) BY MOUTH DAILY. [name], NP  Active   Masks MISC [id number]  Nebulizer face mask J45.909 [name], NP  Active    montelukast (SINGULAIR) 10 MG tablet [id number]  TAKE 1 TABLET (10 MG TOTAL) BY MOUTH AT BEDTIME. Hunsucker, Bonna Gains, MD  Active   omeprazole (PRILOSEC) 20 MG capsule [id number] No TAKE 1 CAPSULE (20 MG TOTAL) BY MOUTH DAILY. [name], NP Taking Active   ondansetron (ZOFRAN-ODT) 4 MG disintegrating tablet [id number] No PLACE ON THE TONGUE TO DISSOLVE. DO NOT SWALLOW. TAKE ONE TABLET BY MOUTH EVERY 8 HOURS AS NEEDED FOR NAUSEA OR VOMITING. [name], NP Taking Active   OXYGEN [id number] No Inhale into the lungs. 3 liters [provider] Taking Active   potassium chloride SA (KLOR-CON) 20 MEQ tablet [id number]  TAKE 1 TABLET (20 MEQ TOTAL) BY MOUTH DAILY FOR 14 DAYS. [name], NP  Active   senna-docusate (SENOKOT-S) 8.6-50 MG tablet [id number] No TAKE 2 TABLETS BY MOUTH DAILY. 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   Essential hypertension 06/27/2007   DYSPNEA 06/27/2007    Conditions to be addressed/monitored per PCP order:  Depression and Grief  Care Plan : LCSW plan of care  Updates made by Greg Cutter, LCSW since 09/07/2020 12:00 AM     Problem: Depression Identification (Depression)      Long-Range Goal: Depressive Symptoms Identified   Start Date: 07/29/2020  Priority: High  Note:   Timeframe:  Long-Range Goal Priority:  High Start Date:    07/29/20                          Expected End Date:  ongoing                 Follow Up Date - 09/29/20   Current barriers:   Chronic Mental Health needs related to Stress, anxiety and depression Mental Health Concerns  Needs Support, Education, and Care Coordination in order to meet unmet mental health needs. Clinical Goal(s): Within the next 120 days, patient will work with [facility] LCSW to gain coping skill education to decrease depression and anxiety and will determine if she willing to gain a referral to therapy and medication management. Clinical Interventions:  Assessed patient's previous and current treatment, coping skills, support system and barriers to care  Patient reports that she is not sure if she wants a referral for mental health support and would like some time to consider this offer. [facility] LCSW will follow up with patient  on 08/09/20. Patient is appreciative of this. UPDATE 09/07/20- Patient is agreeable to counseling and medication management referral to [facility]. Referral placed on 09/07/20. Patient is experiencing active grief and depression due to the loss of her mother.  Patient reports that she successfully filed for disability and is waiting to hear back on their decision.  Review various resources, discussed options and provided patient information about Discussed several options for long term counseling based on need and insurance Depression screen reviewed , Solution-Focused Strategies, Mindfulness or Relaxation Training, Active listening / Reflection utilized , Psychoeducation for mental health needs , Brief CBT , Participation in counseling encouraged , and Suicidal Ideation/Homicidal Ideation assessed: ; Patient interviewed and appropriate assessments performed Discussed plans with patient for ongoing care management follow up and provided patient with direct contact information for care management team Assisted patient/caregiver with obtaining information about  health plan benefits Provided education and assistance to client regarding Advanced Directives. Discussed several options for long term counseling based on need and insurance.  Assisted patient with narrowing the options down to [facility]  ) Patient reports that she is still having issues affording her medications even though she has entered into a pharmacy program that is $10 per month. She is asking for [facility] LCSW to update [facility] Pharmacist on this financial barrier. [facility] LCSW sent this update on 09/07/20. Inter-disciplinary care team collaboration (see longitudinal plan of care) Patient Goals/Self-Care Activities: Over the next 120 days - barriers to treatment adherence identified - complementary therapy use encouraged - counseling provided - depression screen reviewed - self-awareness of emotional triggers encouraged - strategies to manage emotional triggers promoted - suicide risk screen reviewed - avoid negative self-talk - develop a personal safety plan - develop a plan to deal with triggers like holidays, anniversaries - exercise at least 2 to 3 times per week - have a plan for how to handle bad days - journal feelings and what helps to feel better or worse - spend time or talk with others at least 2 to 3 times per week - spend time or talk with others every day - watch for early signs of feeling worse - write in journal every day - begin personal counseling - call and visit an old friend - check out volunteer opportunities - join a support group - laugh; watch a funny movie or comedian - learn and use visualization or guided imagery - perform a random act of kindness - practice relaxation or meditation daily - start or continue a personal journal - talk about feelings with a friend, family or spiritual advisor - practice positive thinking and self-talk Continue with compliance of taking medication        Follow up:  Patient agrees to Care  Plan and Follow-up.  Plan: The Managed Medicaid care management team will reach out to the patient again over the next 30 days.  Date of next scheduled Social Work care management/care coordination outreach:  09/29/20  [name], BSW, MSW, LCSW Managed Medicaid LCSW [email or web address] Phone: [phone]

## 2020-09-12 ENCOUNTER — Other Ambulatory Visit: Payer: Self-pay

## 2020-09-12 MED FILL — Carvedilol Tab 3.125 MG: ORAL | 90 days supply | Qty: 180 | Fill #0 | Status: AC

## 2020-09-12 MED FILL — Ondansetron Orally Disintegrating Tab 4 MG: ORAL | 13 days supply | Qty: 40 | Fill #0 | Status: AC

## 2020-09-12 MED FILL — Omeprazole Cap Delayed Release 20 MG: ORAL | 30 days supply | Qty: 30 | Fill #2 | Status: AC

## 2020-09-12 MED FILL — Amlodipine Besylate Tab 10 MG (Base Equivalent): ORAL | 90 days supply | Qty: 90 | Fill #0 | Status: AC

## 2020-09-12 MED FILL — Cetirizine HCl Tab 10 MG: ORAL | 30 days supply | Qty: 30 | Fill #2 | Status: AC

## 2020-09-15 ENCOUNTER — Other Ambulatory Visit: Payer: Self-pay

## 2020-09-21 DIAGNOSIS — J189 Pneumonia, unspecified organism: Secondary | ICD-10-CM | POA: Diagnosis not present

## 2020-09-21 DIAGNOSIS — J45909 Unspecified asthma, uncomplicated: Secondary | ICD-10-CM | POA: Diagnosis not present

## 2020-09-21 DIAGNOSIS — D869 Sarcoidosis, unspecified: Secondary | ICD-10-CM | POA: Diagnosis not present

## 2020-09-29 ENCOUNTER — Telehealth: Payer: Self-pay | Admitting: Licensed Clinical Social Worker

## 2020-09-29 ENCOUNTER — Ambulatory Visit: Payer: Self-pay

## 2020-09-29 DIAGNOSIS — R32 Unspecified urinary incontinence: Secondary | ICD-10-CM | POA: Diagnosis not present

## 2020-09-29 NOTE — Patient Instructions (Signed)
Teresa Mendez ,   The [facility] Managed Care Team is available to provide assistance to you with your healthcare needs at no cost and as a benefit of your [facility] Health plan. I'm sorry I was unable to reach you today for our scheduled appointment. Our care guide will call you to reschedule our telephone appointment. Please call me at the number below. I am available to be of assistance to you regarding your healthcare needs. .   Thank you,  [name], BSW, MSW, LCSW Managed Medicaid LCSW [email or web address] Phone: [phone]

## 2020-09-29 NOTE — Patient Outreach (Signed)
[facility]) Care Management  09/29/2020  Teresa Mendez 18-Nov-1975 [id number]   LCSW completed [facility] outreach attempt today but was unable to reach patient successfully. A HIPPA compliant voice message was unable to be left encouraging patient to return call once available. LCSW will ask Scheduling Care Guide to reschedule [facility] SW appointment with patient as well.  [name], BSW, MSW, [employer] Managed Medicaid LCSW [email or web address] Phone: [phone]

## 2020-10-06 ENCOUNTER — Other Ambulatory Visit: Payer: Self-pay | Admitting: Licensed Clinical Social Worker

## 2020-10-06 NOTE — Patient Instructions (Signed)
Visit Information  Teresa Mendez was given information about Medicaid Managed Care team care coordination services as a part of their Healthy [facility] Medicaid benefit. Teresa Mendez verbally consented to engagement with the [facility] Managed Care team.   If you are experiencing a medical emergency, please call 911 or report to your local emergency department or urgent care.   If you have a non-emergency medical problem during routine business hours, please contact your provider's office and ask to speak with a nurse.   For questions related to your Healthy [facility] health plan, please call: [phone] or visit the homepage here: [email or web address]  If you would like to schedule transportation through your Healthy [facility] plan, please call the following number at least 2 days in advance of your appointment: [phone]  Call the [place] at [phone], at any time, 24 hours a day, 7 days a week. If you are in danger or need immediate medical attention call 911.  If you would like help to quit smoking, call 1-800-QUIT-NOW 820-875-3199) OR Espaol: 1-855-Djelo-Ya HD:1601594) o para ms informacin haga clic aqu or Text READY to 200-400 to register via text  Teresa Mendez - following are the goals we discussed in your visit today:   Goals Addressed             This Visit's Progress    Track and Manage My Symptoms-Depression       Timeframe:  Long-Range Goal Priority:  High Start Date:    07/29/20                         Expected End Date:  ongoing                 Follow Up Date - 11/10/20   - avoid negative self-talk - develop a personal safety plan - develop a plan to deal with triggers like holidays, anniversaries - exercise at least 2 to 3 times per week - have a plan for how to handle bad days - journal feelings and what helps to feel better or worse - spend time or talk with others at least 2 to 3 times  per week - spend time or talk with others every day - watch for early signs of feeling worse - write in journal every day    Why is this important?   Keeping track of your progress will help your treatment team find the right mix of medicine and therapy for you.  Write in your journal every day.  Day-to-day changes in depression symptoms are normal. It may be more helpful to check your progress at the end of each week instead of every day.     Notes:        [name], BSW, MSW, [employer] Managed Medicaid LCSW [email or web address] Phone: [phone]

## 2020-10-06 NOTE — Patient Outreach (Signed)
Medicaid Managed Care Social Work Note  10/06/2020 Name:  Teresa Mendez MRN:  [id number] DOB:  Aug 09, 1975  Teresa Mendez is an 45 y.o. year old female who is a primary patient of Teresa Pounds, NP.  The Medicaid Managed Care Coordination team was consulted for assistance with:  [place] and Resources  Ms. Godar was given information about Medicaid Managed Care Coordination team services today. Teresa Mendez Patient agreed to services and verbal consent obtained.  Engaged with patient  for by telephone forfollow up visit in response to referral for case management and/or care coordination services.   Assessments/Interventions:  Review of past medical history, allergies, medications, health status, including review of consultants reports, laboratory and other test data, was performed as part of comprehensive evaluation and provision of chronic care management services.  SDOH: (Social Determinant of Health) assessments and interventions performed: SDOH Interventions    Flowsheet Row Most Recent Value  SDOH Interventions   Stress Interventions Provide Counseling  Depression Interventions/Treatment  Referral to Psychiatry       Advanced Directives Status:  See Care Plan for related entries.  Care Plan                 No Known Allergies  Medications Reviewed Today     Reviewed by Teresa Cutter, LCSW (Social Worker) on 09/07/20 at 69  Med List Status: <None>   Medication Order Taking? Sig Documenting Provider Last Dose Status Informant  albuterol (PROAIR HFA) 108 (90 Base) MCG/ACT inhaler [id number]  INHALE 1-2 PUFFS INTO THE LUNGS EVERY 6 (SIX) HOURS AS NEEDED FOR WHEEZING OR SHORTNESS OF BREATH (COUGH). Teresa Pounds, NP  Active   albuterol (PROVENTIL) (2.5 MG/3ML) 0.083% nebulizer solution [id number]  TAKE 3 MLS (2.5 MG TOTAL) BY NEBULIZATION EVERY 6 (SIX) HOURS AS NEEDED FOR WHEEZING OR SHORTNESS OF BREATH. Hunsucker, Bonna Gains, MD  Active   amLODipine  (NORVASC) 10 MG tablet [id number] No TAKE 1 TABLET (10 MG TOTAL) BY MOUTH DAILY. Teresa Pounds, NP Taking Active   aspirin EC 81 MG tablet [id number] No Take 81 mg by mouth daily. [provider] Taking Active   atorvastatin (LIPITOR) 80 MG tablet [id number]  TAKE 1 TABLET (80 MG TOTAL) BY MOUTH DAILY. Teresa Pounds, NP  Active   Budeson-Glycopyrrol-Formoterol (BREZTRI AEROSPHERE) 160-9-4.8 MCG/ACT [name] [id number]  Inhale 2 puffs into the lungs in the morning and at bedtime. Hunsucker, Bonna Gains, MD  Active   Budeson-Glycopyrrol-Formoterol (BREZTRI AEROSPHERE) 160-9-4.8 MCG/ACT [name] [id number]  Inhale 2 puffs into the lungs in the morning and at bedtime. Hunsucker, Bonna Gains, MD  Active   carvedilol (COREG) 3.125 MG tablet [id number] No TAKE 1 TABLET (3.125 MG TOTAL) BY MOUTH 2 (TWO) TIMES DAILY WITH A MEAL. Teresa Pounds, NP Taking Active   cetirizine (ZYRTEC) 10 MG tablet [id number] No TAKE 1 TABLET (10 MG TOTAL) BY MOUTH DAILY. Teresa Pounds, NP Taking Active   DULoxetine (CYMBALTA) 60 MG capsule [id number]  TAKE 1 CAPSULE (60 MG TOTAL) BY MOUTH DAILY. Teresa Pounds, NP  Active   famotidine (PEPCID) 20 MG tablet [id number] No Take 1 tablet (20 mg total) by mouth at bedtime. [name], NP Taking Active   ferrous sulfate 325 (65 FE) MG tablet [id number]  Take 1 tablet (325 mg total) by mouth daily with breakfast. Teresa Pounds, NP  Expired 08/07/20 2359   fluticasone (FLONASE) 50 MCG/ACT nasal spray [id number] No Place 2 sprays into both nostrils  daily. Teresa Pounds, NP Taking Active   hydrochlorothiazide (HYDRODIURIL) 25 MG tablet [id number] No TAKE 1 TABLET (25 MG TOTAL) BY MOUTH DAILY. Teresa Pounds, NP Taking Active   lisinopril (ZESTRIL) 40 MG tablet [id number]  TAKE 1 TABLET (40 MG TOTAL) BY MOUTH DAILY. Teresa Pounds, NP  Active   Masks MISC [id number]  Nebulizer face mask J45.909 Teresa Pounds, NP  Active   montelukast (SINGULAIR) 10 MG tablet [id number]  TAKE  1 TABLET (10 MG TOTAL) BY MOUTH AT BEDTIME. Hunsucker, Bonna Gains, MD  Active   omeprazole (PRILOSEC) 20 MG capsule [id number] No TAKE 1 CAPSULE (20 MG TOTAL) BY MOUTH DAILY. Teresa Pounds, NP Taking Active   ondansetron (ZOFRAN-ODT) 4 MG disintegrating tablet [id number] No PLACE ON THE TONGUE TO DISSOLVE. DO NOT SWALLOW. TAKE ONE TABLET BY MOUTH EVERY 8 HOURS AS NEEDED FOR NAUSEA OR VOMITING. Teresa Pounds, NP Taking Active   OXYGEN [id number] No Inhale into the lungs. 3 liters [provider] Taking Active   potassium chloride SA (KLOR-CON) 20 MEQ tablet [id number]  TAKE 1 TABLET (20 MEQ TOTAL) BY MOUTH DAILY FOR 14 DAYS. Teresa Pounds, NP  Active   senna-docusate (SENOKOT-S) 8.6-50 MG tablet [id number] No TAKE 2 TABLETS BY MOUTH DAILY. 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/14/2007   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   Essential hypertension 06/27/2007   DYSPNEA 06/27/2007    Conditions to be addressed/monitored per PCP order:  Anxiety and Depression  Care Plan : LCSW plan of care  Updates made by Teresa Cutter, LCSW since 10/06/2020 12:00 AM     Problem: Depression Identification (Depression)      Long-Range Goal: Depressive Symptoms Identified   Start Date: 07/29/2020  Priority: High  Note:   Timeframe:  Long-Range Goal Priority:  High Start Date:    07/29/20                         Expected End Date:  ongoing                  Follow Up Date - 11/10/20   Current barriers:   Chronic Mental Health needs related to Stress, anxiety and depression Mental Health Concerns  Needs Support, Education, and Care Coordination in order to meet unmet mental health needs. Clinical Goal(s): Within the next 120 days, patient will work with [facility] LCSW to gain coping skill education to decrease depression and anxiety and will determine if she willing to gain a referral to therapy and medication management. Clinical Interventions:  Assessed patient's previous and current treatment, coping skills, support system and barriers to care  Patient reports that she is not sure if she wants a referral for mental health support and would like some time to consider this offer. [facility] LCSW will follow up with patient on 08/09/20. Patient is appreciative of this. UPDATE  09/07/20- Patient is agreeable to counseling and medication management referral to [facility]. Referral placed on 09/07/20. Patient is experiencing active grief and depression due to the loss of her mother. Update 10/06/20- Patient reports not hearing from [facility] yet. Message was went to [facility] regarding this concern. Patient reports wanting either HH or PCS and will contact her primary care doctor today to schedule an appointment for this need. Patient reports that she successfully filed for disability and is waiting to hear back on their decision.  Review various resources, discussed options and provided patient information about Discussed several options for long term counseling based on need and insurance Depression screen reviewed , Solution-Focused Strategies, Mindfulness or Relaxation Training, Active listening / Reflection utilized , Psychoeducation for mental health needs , Brief CBT , Participation in counseling encouraged , and Suicidal Ideation/Homicidal Ideation assessed: ; Patient interviewed and appropriate assessments performed Discussed plans  with patient for ongoing care management follow up and provided patient with direct contact information for care management team Assisted patient/caregiver with obtaining information about health plan benefits Provided education and assistance to client regarding Advanced Directives. Discussed several options for long term counseling based on need and insurance.  Assisted patient with narrowing the options down to [facility]  ) Patient reports that she is still having issues affording her medications even though she has entered into a pharmacy program that is $10 per month. She is asking for [facility] LCSW to update [facility] Pharmacist on this financial barrier. [facility] LCSW sent this update on 09/07/20. Inter-disciplinary care team collaboration (see longitudinal plan of care) Patient Goals/Self-Care Activities: Over the next 120 days - barriers to treatment adherence identified - complementary therapy use encouraged - counseling provided - depression screen reviewed - self-awareness of emotional triggers encouraged - strategies to manage emotional triggers promoted - suicide risk screen reviewed - avoid negative self-talk - develop a personal safety plan - develop a plan to deal with triggers like holidays, anniversaries - exercise at least 2 to 3 times per week - have a plan for how to handle bad days - journal feelings and what helps to feel better or worse - spend time or talk with others at least 2 to 3 times per week - spend time or talk with others every day - watch for early signs of feeling worse - write in journal every day - begin personal counseling - call and visit an old friend - check out volunteer opportunities - join a support group - laugh; watch a funny movie or comedian - learn and use visualization or guided imagery - perform a random act of kindness - practice relaxation or meditation daily - start or continue a personal journal - talk about  feelings with a friend, family or spiritual advisor - practice positive thinking and self-talk Continue with compliance of taking medication        Follow up:  Patient agrees to Care Plan and Follow-up.  Plan: The Managed Medicaid care management team will reach out to the patient again over the next 45 days.  Date/time of next scheduled Social Work care management/care coordination outreach:  11/10/20  Teresa Mendez, BSW, MSW, LCSW Managed Medicaid LCSW [email or web address] Phone: [phone]

## 2020-10-14 ENCOUNTER — Telehealth: Payer: Self-pay | Admitting: Nurse Practitioner

## 2020-10-14 ENCOUNTER — Encounter: Payer: Self-pay | Admitting: Nurse Practitioner

## 2020-10-14 NOTE — Telephone Encounter (Signed)
Please add patient to Monday at 810 for PCS form

## 2020-10-17 ENCOUNTER — Ambulatory Visit: Payer: Medicaid Other | Attending: Nurse Practitioner | Admitting: Nurse Practitioner

## 2020-10-17 ENCOUNTER — Encounter: Payer: Self-pay | Admitting: Nurse Practitioner

## 2020-10-17 ENCOUNTER — Other Ambulatory Visit: Payer: Self-pay

## 2020-10-17 DIAGNOSIS — Z0289 Encounter for other administrative examinations: Secondary | ICD-10-CM

## 2020-10-17 NOTE — Progress Notes (Signed)
Virtual Visit via Telephone Note Due to national recommendations of social distancing due to [place] 19, telehealth visit is felt to be most appropriate for this patient at this time.  I discussed the limitations, risks, security and privacy concerns of performing an evaluation and management service by telephone and the availability of in person appointments. I also discussed with the patient that there may be a patient responsible charge related to this service. The patient expressed understanding and agreed to proceed.    I connected with Teresa Mendez on 10/17/20  at   8:10 AM EDT  EDT by telephone and verified that I am speaking with the correct person using two identifiers.  Location of Patient: Private Residence   Location of Provider: [place] and [place] participating in Telemedicine visit: Geryl Rankins FNP-BC Teresa Mendez    History of Present Illness: Telemedicine visit for: [facility] request  She is requesting PCS services for the following assistance: washing dishes, cleaning up, house chores.  She does get short of breath when ambulating to the mailbox. She is on home O2 2-3L. History of pulmonary sarcoidosis.  She denies needed assistance with the following: bathing, dressing, grooming, toileting or eating.      Past Medical History:  Diagnosis Date   Asthma    Hypertension    Sarcoidosis    Sickle cell trait ([place])     Past Surgical History:  Procedure Laterality Date   BREAST SURGERY Right    biopsy   TUBAL LIGATION      Family History  Problem Relation Age of Onset   Hypertension Mother    Congestive Heart Failure Mother    Kidney failure Mother    Sickle cell trait Mother    Hypertension Father    Congestive Heart Failure Father    Kidney failure Father    Glaucoma Father    Hypertension Brother    Liver disease Brother    Sickle cell trait Son    Sickle cell trait Daughter    Sickle cell trait Daughter     Social  History   Socioeconomic History   Marital status: Single    Spouse name: Not on file   Number of children: Not on file   Years of education: Not on file   Highest education level: Not on file  Occupational History   Not on file  Tobacco Use   Smoking status: Former    Packs/day: 0.50    Types: Cigarettes    Quit date: 01/02/2019    Years since quitting: 1.7   Smokeless tobacco: Never  Vaping Use   Vaping Use: Former  Substance and Sexual Activity   Alcohol use: Yes    Comment: Occasionally    Drug use: No   Sexual activity: Yes  Other Topics Concern   Not on file  Social History Narrative   Not on file   Social Determinants of Health   Financial Resource Strain: Not on file  Food Insecurity: Not on file  Transportation Needs: Not on file  Physical Activity: Not on file  Stress: Not on file  Social Connections: Not on file     Observations/Objective: Awake, alert and oriented x 3   Review of Systems  Constitutional:  Positive for malaise/fatigue. Negative for fever and weight loss.  HENT: Negative.  Negative for nosebleeds.   Eyes: Negative.  Negative for blurred vision, double vision and photophobia.  Respiratory:  Positive for cough and shortness of  breath.   Cardiovascular: Negative.  Negative for chest pain, palpitations and leg swelling.  Gastrointestinal: Negative.  Negative for heartburn, nausea and vomiting.  Musculoskeletal: Negative.  Negative for myalgias.  Neurological: Negative.  Negative for dizziness, focal weakness, seizures and headaches.  Psychiatric/Behavioral: Negative.  Negative for suicidal ideas.    Assessment and Plan: There are no diagnoses linked to this encounter.   Follow Up Instructions No follow-ups on file.     I discussed the assessment and treatment plan with the patient. The patient was provided an opportunity to ask questions and all were answered. The patient agreed with the plan and demonstrated an understanding of the  instructions.   The patient was advised to call back or seek an in-person evaluation if the symptoms worsen or if the condition fails to improve as anticipated.  I provided 8 minutes of non-face-to-face time during this encounter including median intraservice time, reviewing previous notes, labs, imaging, medications and explaining diagnosis and management.  Teresa Pounds, FNP-BC

## 2020-10-20 ENCOUNTER — Ambulatory Visit: Payer: Self-pay

## 2020-10-23 NOTE — Progress Notes (Deleted)
Office Visit Note  Patient: Teresa Mendez             Date of Birth: [birth year]           MRN: [id number]             PCP: [name], NP Referring: [name], NP Visit Date: 10/27/2020 Occupation: _0 @  Subjective:  No chief complaint on file.   History of Present Illness: Teresa Mendez is a 45 y.o. female ***   Activities of Daily Living:  Patient reports morning stiffness for *** {minute/hour:19697}.   Patient {ACTIONS;DENIES/REPORTS:21021675::"Denies"} nocturnal pain.  Difficulty dressing/grooming: {ACTIONS;DENIES/REPORTS:21021675::"Denies"} Difficulty climbing stairs: {ACTIONS;DENIES/REPORTS:21021675::"Denies"} Difficulty getting out of chair: {ACTIONS;DENIES/REPORTS:21021675::"Denies"} Difficulty using hands for taps, buttons, cutlery, and/or writing: {ACTIONS;DENIES/REPORTS:21021675::"Denies"}  No Rheumatology ROS completed.   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   Essential hypertension 06/27/2007   DYSPNEA 06/27/2007    Past Medical History:  Diagnosis Date   Asthma    Hypertension    Sarcoidosis    Sickle cell trait ([place])     Family History  Problem Relation Age of Onset   Hypertension Mother    Congestive Heart Failure Mother    Kidney failure Mother    Sickle cell trait Mother    Hypertension Father    Congestive Heart Failure Father    Kidney failure Father    Glaucoma  Father    Hypertension Brother    Liver disease Brother    Sickle cell trait Son    Sickle cell trait Daughter    Sickle cell trait Daughter    Past Surgical History:  Procedure Laterality Date   BREAST SURGERY Right    biopsy   TUBAL LIGATION     Social History   Social History Narrative   Not on file   Immunization History  Administered Date(s) Administered   Influenza,inj,Quad PF,6+ Mos 10/13/2019   PFIZER(Purple Top)SARS-COV-2 Vaccination 05/21/2020, 06/16/2020     Objective: Vital Signs: There were no vitals taken for this visit.   Physical Exam   Musculoskeletal Exam: ***  CDAI Exam: CDAI Score: -- Patient Global: --; Provider Global: -- Swollen: --; Tender: -- Joint Exam 10/27/2020   No joint exam has been documented for this visit   There is currently no information documented on the homunculus. Go to the Rheumatology activity and complete the homunculus joint exam.  Investigation: No additional findings.  Imaging: No results found.  Recent Labs: Lab Results  Component Value Date   WBC 9.6 03/25/2020   HGB 11.5 03/25/2020   PLT 362 03/25/2020   NA 137 03/25/2020   K 3.2 (L) 03/25/2020   CL 92 (L) 03/25/2020   CO2 31 (H) 03/25/2020   GLUCOSE 100 (H) 03/25/2020   BUN 9 03/25/2020   CREATININE 0.68 03/25/2020   BILITOT 0.3 03/25/2020   ALKPHOS 98 03/25/2020   AST 16 03/25/2020   ALT 12 03/25/2020   PROT 7.0 03/25/2020  ALBUMIN 4.2 03/25/2020   CALCIUM 9.0 03/25/2020   GFRAA 121 03/25/2020   May 30, 2020 CK144, TSH normal, ENA negative, C3-C4 normal  07/03/19: ANA 1:80NS, ESR 40, CRP 1.3, RF<14, Anti-CCP<16, Ace 12, BNP 10   IMPRESSION: 1. Interval clearing of previously seen bilateral patchy pulmonary parenchymal ground-glass, indicative of an infectious/inflammatory etiology, including due to COVID-19. 2. Scattered pulmonary parenchymal scarring, similar, but slightly more organized, than on 06/10/2007. 3.  Emphysema  (ICD10-J43.9). 4. Right breast nodule. Physical exam and ultrasound are suggested, as clinically indicated. These results will be called to the ordering clinician or representative by the Radiologist Assistant, and communication documented in the PACS or [employer].     Electronically Signed   By: Lorin Picket M.D.   On: 10/14/2019 08:18  Speciality Comments: No specialty comments available.  Procedures:  No procedures performed Allergies: Patient has no known allergies.   Assessment / Plan:     Visit Diagnoses: No diagnosis found.  Orders: No orders of the defined types were placed in this encounter.  No orders of the defined types were placed in this encounter.   Face-to-face time spent with patient was *** minutes. Greater than 50% of time was spent in counseling and coordination of care.  Follow-Up Instructions: No follow-ups on file.   Bo Merino, MD  Note - This record has been created using Editor, commissioning.  Chart creation errors have been sought, but may not always  have been located. Such creation errors do not reflect on  the standard of medical care.

## 2020-10-27 ENCOUNTER — Ambulatory Visit: Payer: Medicaid Other | Admitting: Rheumatology

## 2020-10-27 DIAGNOSIS — R768 Other specified abnormal immunological findings in serum: Secondary | ICD-10-CM

## 2020-10-27 DIAGNOSIS — I10 Essential (primary) hypertension: Secondary | ICD-10-CM

## 2020-10-27 DIAGNOSIS — R5383 Other fatigue: Secondary | ICD-10-CM

## 2020-10-27 DIAGNOSIS — M791 Myalgia, unspecified site: Secondary | ICD-10-CM

## 2020-10-27 DIAGNOSIS — M19041 Primary osteoarthritis, right hand: Secondary | ICD-10-CM

## 2020-10-27 DIAGNOSIS — M47816 Spondylosis without myelopathy or radiculopathy, lumbar region: Secondary | ICD-10-CM

## 2020-10-27 DIAGNOSIS — D869 Sarcoidosis, unspecified: Secondary | ICD-10-CM

## 2020-10-27 DIAGNOSIS — M170 Bilateral primary osteoarthritis of knee: Secondary | ICD-10-CM

## 2020-10-27 DIAGNOSIS — Z87891 Personal history of nicotine dependence: Secondary | ICD-10-CM

## 2020-10-27 DIAGNOSIS — J449 Chronic obstructive pulmonary disease, unspecified: Secondary | ICD-10-CM

## 2020-10-27 DIAGNOSIS — M19071 Primary osteoarthritis, right ankle and foot: Secondary | ICD-10-CM

## 2020-10-29 DIAGNOSIS — R32 Unspecified urinary incontinence: Secondary | ICD-10-CM | POA: Diagnosis not present

## 2020-11-04 ENCOUNTER — Ambulatory Visit: Payer: Self-pay

## 2020-11-04 ENCOUNTER — Telehealth: Payer: Self-pay | Admitting: Pharmacist

## 2020-11-04 NOTE — Patient Outreach (Signed)
11/04/2020 Name: Teresa Mendez MRN: [id number] DOB: November 02, 1975  Referred by: [name], NP Reason for referral : No chief complaint on file.   An unsuccessful telephone outreach was attempted today. The patient was referred to the case management team for assistance with care management and care coordination.    A successful telephone outreach was attempted today. Patient stated some family surprised her and she was unable to chat. Requested to re-schedule.  Follow Up Plan: The Managed Medicaid care management team will reach out to the patient again over the next 30 days. Appointment has been re-scheduled  Hughes Better PharmD, CPP [place] [phone]

## 2020-11-05 DIAGNOSIS — J439 Emphysema, unspecified: Secondary | ICD-10-CM | POA: Diagnosis not present

## 2020-11-05 DIAGNOSIS — J849 Interstitial pulmonary disease, unspecified: Secondary | ICD-10-CM | POA: Diagnosis not present

## 2020-11-05 DIAGNOSIS — D869 Sarcoidosis, unspecified: Secondary | ICD-10-CM | POA: Diagnosis not present

## 2020-11-08 ENCOUNTER — Other Ambulatory Visit: Payer: Self-pay

## 2020-11-08 ENCOUNTER — Ambulatory Visit: Payer: Medicaid Other | Attending: Nurse Practitioner | Admitting: Nurse Practitioner

## 2020-11-08 ENCOUNTER — Telehealth: Payer: Self-pay | Admitting: Nurse Practitioner

## 2020-11-08 ENCOUNTER — Encounter: Payer: Self-pay | Admitting: Nurse Practitioner

## 2020-11-08 DIAGNOSIS — Z9981 Dependence on supplemental oxygen: Secondary | ICD-10-CM

## 2020-11-08 DIAGNOSIS — J9611 Chronic respiratory failure with hypoxia: Secondary | ICD-10-CM

## 2020-11-08 DIAGNOSIS — D869 Sarcoidosis, unspecified: Secondary | ICD-10-CM | POA: Diagnosis not present

## 2020-11-08 NOTE — Telephone Encounter (Signed)
No answer. LVM to return call to office

## 2020-11-08 NOTE — Progress Notes (Addendum)
Virtual Visit via Telephone Note Due to national recommendations of social distancing due to [place] 19, telehealth visit is felt to be most appropriate for this patient at this time.  I discussed the limitations, risks, security and privacy concerns of performing an evaluation and management service by telephone and the availability of in person appointments. I also discussed with the patient that there may be a patient responsible charge related to this service. The patient expressed understanding and agreed to proceed.    I connected with [name] on 11/08/20  at   8:10 AM EDT  EDT by telephone and verified that I am speaking with the correct person using two identifiers.  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   We ordered PCS for Ms. Lentz several weeks ago. Today was a courtesy call to see how things were going with home assistance.  She states there were some issues with her medicaid that her case worker is handling at this time and she does not currently have PCS in her home. Doing well today.  Needs a new nebulizer machine. States she called [employer] and was told they do not accept requests for DME such as nebulizers any more and referred Ms. Flack back to me. Will check with our RN CM here at the clinic for advice on this.  Denies chest pain, any worsening shortness of breath, palpitations, lightheadedness, dizziness, headaches or BLE edema.   Past Medical History:  Diagnosis Date   Asthma    Hypertension    Sarcoidosis    Sickle cell trait ([place])     Past Surgical History:  Procedure Laterality Date   BREAST SURGERY  Right    biopsy   TUBAL LIGATION      Family History  Problem Relation Age of Onset   Hypertension Mother    Congestive Heart Failure Mother    Kidney failure Mother    Sickle cell trait Mother    Hypertension Father    Congestive Heart Failure Father    Kidney failure Father    Glaucoma Father    Hypertension Brother    Liver disease Brother    Sickle cell trait Son    Sickle cell trait Daughter    Sickle cell trait Daughter     Social History   Socioeconomic History   Marital status: Single    Spouse name: Not on file   Number of children: Not on file   Years of education: Not on file   Highest education level: Not on file  Occupational History   Not on file  Tobacco Use   Smoking status: Former    Packs/day: 0.50    Types: Cigarettes    Quit date: 01/02/2019    Years since quitting: 1.8   Smokeless tobacco: Never  Vaping Use   Vaping Use: Former  Substance and Sexual Activity   Alcohol use: Yes    Comment: Occasionally    Drug use: No   Sexual activity: Yes  Other Topics Concern   Not on file  Social History Narrative   Not on file   Social Determinants of Health   Financial Resource Strain: Not on file  Food Insecurity: Not on file  Transportation Needs: Not on file  Physical Activity: Not on file  Stress: Not on file  Social Connections: Not on file    Review of Systems  Constitutional:  Positive for malaise/fatigue.  Respiratory:  Positive for shortness of breath (chronic). Negative for hemoptysis and wheezing.   Cardiovascular:  Negative for chest pain and leg swelling.  Gastrointestinal:  Negative for abdominal pain.  Genitourinary: Negative.   Musculoskeletal: Negative.   Skin: Negative.   Psychiatric/Behavioral: Negative.     Observations/Objective: Awake, alert and oriented x 3    Assessment and Plan: Diagnoses and all orders for this visit:  PULMONARY SARCOIDOSIS Awaiting PCS. Her case manager is working on this. We have already  faxed the form from our office Needs new nebulizer machine. 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 HOME O2 on 1-3L     Follow Up Instructions Return in about 2 months (around 01/08/2021) for HTN.     I discussed the assessment and treatment plan with the patient. The patient was provided an opportunity to ask questions and all were answered. The patient agreed with the plan and demonstrated an understanding of the instructions.   The patient was advised to call back or seek an in-person evaluation if the symptoms worsen or if the condition fails to improve as anticipated.  I provided 8 minutes of non-face-to-face time during this encounter including median intraservice time, reviewing previous notes, labs, imaging, medications and explaining diagnosis and management.  [name], FNP-BC

## 2020-11-09 DIAGNOSIS — D860 Sarcoidosis of lung: Secondary | ICD-10-CM | POA: Diagnosis not present

## 2020-11-09 DIAGNOSIS — J449 Chronic obstructive pulmonary disease, unspecified: Secondary | ICD-10-CM | POA: Diagnosis not present

## 2020-11-09 DIAGNOSIS — J9611 Chronic respiratory failure with hypoxia: Secondary | ICD-10-CM | POA: Diagnosis not present

## 2020-11-10 ENCOUNTER — Ambulatory Visit: Payer: Self-pay

## 2020-11-14 ENCOUNTER — Other Ambulatory Visit: Payer: Self-pay | Admitting: Obstetrics and Gynecology

## 2020-11-14 ENCOUNTER — Other Ambulatory Visit: Payer: Self-pay

## 2020-11-14 NOTE — Patient Instructions (Signed)
Hi Teresa Mendez, thank you for speaking with me today, have a nice afternoon.  Teresa Mendez was given information about Medicaid Managed Care team care coordination services as a part of their Healthy [facility] Medicaid benefit. Teresa Mendez verbally consented to engagement with the [facility] Managed Care team.   If you are experiencing a medical emergency, please call 911 or report to your local emergency department or urgent care.   If you have a non-emergency medical problem during routine business hours, please contact your provider's office and ask to speak with a nurse.   For questions related to your Healthy [facility] health plan, please call: [phone] or visit the homepage here: [email or web address]  If you would like to schedule transportation through your Healthy [facility] plan, please call the following number at least 2 days in advance of your appointment: [phone]  Call the [place] at [phone], at any time, 24 hours a day, 7 days a week. If you are in danger or need immediate medical attention call 911.  If you would like help to quit smoking, call 1-800-QUIT-NOW (707) 671-7953) OR Espaol: 1-855-Djelo-Ya (1-749-449-6759) o para ms informacin haga clic aqu or Text READY to 200-400 to register via text  Teresa Mendez - following are the goals we discussed in your visit today:   Goals Addressed             This Visit's Progress    [place] My Quality of Life       Timeframe:  Long-Range Goal Priority:  Medium Start Date:           02/26/20                  Expected End Date:  ongoing                Follow Up Date:  12/15/20   - discuss my treatment options with the doctor or nurse - make shared treatment decisions with doctor   Update 11/14/20:  Patient trying to get PCS.  Approved for disability 08/04/20    The patient verbalized understanding of instructions provided today and declined a print  copy of patient instruction materials.   The Managed Medicaid care management team will reach out to the patient again over the next 30 days.  The  Patient has been provided with contact information for the Managed Medicaid care management team and has been advised to call with any health related questions or concerns.   Aida Raider RN, BSN [place]  [facility] - Managed Medicaid High Risk [phone].   Following is a copy of your plan of care:  Care Plan : General Plan of Care (Adult)  Updates made by Gayla Medicus, RN since 11/14/2020 12:00 AM     Problem: Health Promotion or Disease Self-Management (General Plan of Care)   Priority: Medium  Onset Date: 02/26/2020     Long-Range Goal: Self-Management Plan Developed   Start Date: 02/26/2020  Expected End Date: 02/14/2021  Recent Progress: On track  Priority: Medium  Note:   Current Barriers:  Chronic Disease Management support.  Patient approved for disability 08/04/20.  Awaiting PCS approval.  Nurse Case Manager Clinical Goal(s):  Over the next 90 days, patient will attend all scheduled medical appointments Over the next 30 days, patient will work with CM team pharmacist to review medications.  Interventions:  Inter-disciplinary care team collaboration (see longitudinal plan of care) Reviewed medications with patient. Collaborated with  pharmacy regarding medications. Discussed plans with patient for ongoing care management follow up and provided patient with direct contact information for care management team Reviewed scheduled/upcoming provider appointments. Pharmacy referral for medication review-Pharmacy following Collaborated with SW. SW referral for [facility] and disability assistance.  Patient Goals/Self-Care Activities Over the next 90 days, patient will:  -Self administers medications as prescribed Attends all scheduled provider appointments Calls pharmacy for medication  refills Calls provider office for new concerns or questions  Follow Up Plan: The Managed Medicaid care management team will reach out to the patient again over the next 30 days.  The patient has been provided with contact information for the Managed Medicaid care management team and has been advised to call with any health related questions or concerns.

## 2020-11-14 NOTE — Patient Outreach (Signed)
Medicaid Managed Care   Nurse Care Manager Note  11/14/2020 Name:  Teresa Mendez MRN:  [id number] DOB:  10-Jun-1975  Teresa Mendez is an 45 y.o. year old female who is a primary patient of Teresa Pounds, NP.  The [facility] Managed Care Coordination team was consulted for assistance with:    Chronic healthcare management needs.  [name] was given information about Medicaid Managed Care Coordination team services today. Teresa Mendez Patient agreed to services and verbal consent obtained.  Engaged with patient by telephone for follow up visit in response to provider referral for case management and/or care coordination services.   Assessments/Interventions:  Review of past medical history, allergies, medications, health status, including review of consultants reports, laboratory and other test data, was performed as part of comprehensive evaluation and provision of chronic care management services.  SDOH (Social Determinants of Health) assessments and interventions performed: SDOH Interventions    Flowsheet Row Most Recent Value  SDOH Interventions   Food Insecurity Interventions Intervention Not Indicated  Housing Interventions Intervention Not Indicated  Intimate Partner Violence Interventions Intervention Not Indicated  Physical Activity Interventions Intervention Not Indicated, Other (Comments)  [not physically able]  Transportation Interventions Intervention Not Indicated       Care Plan  No Known Allergies  Medications Reviewed Today     Reviewed by Gayla Medicus, RN (Registered Nurse) on 11/14/20 at 1417  Med List Status: <None>   Medication Order Taking? Sig Documenting Provider Last Dose Status Informant  albuterol (PROAIR HFA) 108 (90 Base) MCG/ACT inhaler [id number]  INHALE 1-2 PUFFS INTO THE LUNGS EVERY 6 (SIX) HOURS AS NEEDED FOR WHEEZING OR SHORTNESS OF BREATH (COUGH). Teresa Pounds, NP  Active   albuterol (PROVENTIL) (2.5 MG/3ML) 0.083% nebulizer solution  [id number]  TAKE 3 MLS (2.5 MG TOTAL) BY NEBULIZATION EVERY 6 (SIX) HOURS AS NEEDED FOR WHEEZING OR SHORTNESS OF BREATH. Hunsucker, Bonna Gains, MD  Active   amLODipine (NORVASC) 10 MG tablet [id number] No TAKE 1 TABLET (10 MG TOTAL) BY MOUTH DAILY. Teresa Pounds, NP Taking Active   aspirin EC 81 MG tablet [id number] No Take 81 mg by mouth daily. [provider] Taking Active   atorvastatin (LIPITOR) 80 MG tablet [id number]  TAKE 1 TABLET (80 MG TOTAL) BY MOUTH DAILY. Teresa Pounds, NP  Active   Budeson-Glycopyrrol-Formoterol (BREZTRI AEROSPHERE) 160-9-4.8 MCG/ACT [name] [id number]  Inhale 2 puffs into the lungs in the morning and at bedtime. Hunsucker, Bonna Gains, MD  Active   Budeson-Glycopyrrol-Formoterol (BREZTRI AEROSPHERE) 160-9-4.8 MCG/ACT [name] [id number]  Inhale 2 puffs into the lungs in the morning and at bedtime. Hunsucker, Bonna Gains, MD  Active   carvedilol (COREG) 3.125 MG tablet [id number] No TAKE 1 TABLET (3.125 MG TOTAL) BY MOUTH 2 (TWO) TIMES DAILY WITH A MEAL. Teresa Pounds, NP Taking Active   cetirizine (ZYRTEC) 10 MG tablet [id number] No TAKE 1 TABLET (10 MG TOTAL) BY MOUTH DAILY. Teresa Pounds, NP Taking Active   DULoxetine (CYMBALTA) 60 MG capsule [id number]  TAKE 1 CAPSULE (60 MG TOTAL) BY MOUTH DAILY. Teresa Pounds, NP  Active   famotidine (PEPCID) 20 MG tablet [id number] No Take 1 tablet (20 mg total) by mouth at bedtime. [name], NP Taking Active   ferrous sulfate 325 (65 FE) MG tablet [id number]  Take 1 tablet (325 mg total) by mouth daily with breakfast. Teresa Pounds, NP  Expired 08/07/20 2359   fluticasone (FLONASE) 50 MCG/ACT nasal spray [id number] No  Place 2 sprays into both nostrils daily. Teresa Pounds, NP Taking Active   hydrochlorothiazide (HYDRODIURIL) 25 MG tablet [id number] No TAKE 1 TABLET (25 MG TOTAL) BY MOUTH DAILY. Teresa Pounds, NP Taking Expired 11/05/20 2359   lisinopril (ZESTRIL) 40 MG tablet [id number]  TAKE 1 TABLET (40 MG TOTAL)  BY MOUTH DAILY. Teresa Pounds, NP  Active   Masks MISC [id number]  Nebulizer face mask J45.909 Teresa Pounds, NP  Active   montelukast (SINGULAIR) 10 MG tablet [id number]  TAKE 1 TABLET (10 MG TOTAL) BY MOUTH AT BEDTIME. Hunsucker, Bonna Gains, MD  Active   omeprazole (PRILOSEC) 20 MG capsule [id number] No TAKE 1 CAPSULE (20 MG TOTAL) BY MOUTH DAILY. Teresa Pounds, NP Taking Expired 11/05/20 2359   ondansetron (ZOFRAN-ODT) 4 MG disintegrating tablet [id number] No PLACE ON THE TONGUE TO DISSOLVE. DO NOT SWALLOW. TAKE ONE TABLET BY MOUTH EVERY 8 HOURS AS NEEDED FOR NAUSEA OR VOMITING. Teresa Pounds, NP Taking Active   OXYGEN [id number] No Inhale into the lungs. 3 liters [provider] Taking Active   potassium chloride SA (KLOR-CON) 20 MEQ tablet [id number]  TAKE 1 TABLET (20 MEQ TOTAL) BY MOUTH DAILY FOR 14 DAYS. Teresa Pounds, NP  Active   senna-docusate (SENOKOT-S) 8.6-50 MG tablet [id number] No TAKE 2 TABLETS BY MOUTH DAILY. 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   Essential hypertension 06/27/2007   DYSPNEA 06/27/2007    Conditions to be addressed/monitored per PCP order:   chronic healthcare management needs, asthma, HTN, COPD, Sarcoidosis, GERD, SCC trait,   Care Plan : General Plan of Care (Adult)  Updates made by Gayla Medicus, RN since 11/14/2020 12:00 AM     Problem:  Health Promotion or Disease Self-Management (General Plan of Care)   Priority: Medium  Onset Date: 02/26/2020     Long-Range Goal: Self-Management Plan Developed   Start Date: 02/26/2020  Expected End Date: 02/14/2021  Recent Progress: On track  Priority: Medium  Note:   Current Barriers:  Chronic Disease Management support.  Patient approved for disability 08/04/20.  Awaiting PCS approval.  Nurse Case Manager Clinical Goal(s):  Over the next 90 days, patient will attend all scheduled medical appointments Over the next 30 days, patient will work with CM team pharmacist to review medications.  Interventions:  Inter-disciplinary care team collaboration (see longitudinal plan of care) Reviewed medications with patient. Collaborated with pharmacy regarding medications. Discussed plans with patient for ongoing care management follow up and provided patient with direct contact information for care management team Reviewed scheduled/upcoming provider appointments. Pharmacy referral for medication review-Pharmacy following Collaborated with SW. SW referral for [facility] and disability assistance.  Patient Goals/Self-Care Activities Over the next 90 days, patient will:  -Self administers medications as prescribed Attends all scheduled provider appointments Calls pharmacy for medication refills Calls provider office for new concerns or questions  Follow Up Plan: The Managed Medicaid care management team will reach out to the patient again over the next 30 days.  The patient has been provided with contact information for the Managed Medicaid care management team and has been advised to call with any health related questions or concerns.   Follow Up:  Patient agrees to Care Plan and Follow-up.  Plan: The Managed Medicaid care management team will reach out to the patient again over the next 30 days. and The  Patient has been provided with contact information for the Managed Medicaid care management  team and has been advised to call with any health related questions or concerns.  Date/time of next scheduled RN care management/care coordination outreach: 12/15/20 at 0900

## 2020-11-16 ENCOUNTER — Other Ambulatory Visit: Payer: Self-pay

## 2020-11-16 ENCOUNTER — Other Ambulatory Visit: Payer: Self-pay | Admitting: Nurse Practitioner

## 2020-11-16 MED FILL — Cetirizine HCl Tab 10 MG: ORAL | 30 days supply | Qty: 30 | Fill #3 | Status: AC

## 2020-11-16 MED FILL — Sennosides-Docusate Sodium Tab 8.6-50 MG: ORAL | 30 days supply | Qty: 60 | Fill #0 | Status: CN

## 2020-11-16 NOTE — Telephone Encounter (Signed)
What kind of mask is she requesting? There is a nebulizer mask prescription already ordered from a month or so ago. Thanks!

## 2020-11-16 NOTE — Telephone Encounter (Signed)
Requested medications are due for refill today.  yes  Requested medications are on the active medications list.  yes  Last refill. 11/06/2019 for both  Future visit scheduled.   yes  Notes to clinic.  Both rx's were written to expire 11/05/2020

## 2020-11-17 ENCOUNTER — Other Ambulatory Visit: Payer: Self-pay

## 2020-11-17 MED ORDER — OMEPRAZOLE 20 MG PO CPDR
DELAYED_RELEASE_CAPSULE | Freq: Every day | ORAL | 2 refills | Status: DC
  Filled 2020-11-17: qty 30, 30d supply, fill #0
  Filled 2021-01-31: qty 30, 30d supply, fill #1
  Filled 2021-02-04: qty 30, 30d supply, fill #0
  Filled 2021-03-28: qty 30, 30d supply, fill #1

## 2020-11-17 NOTE — Telephone Encounter (Signed)
Called and left a vm to return call to office.

## 2020-11-18 ENCOUNTER — Other Ambulatory Visit: Payer: Self-pay

## 2020-11-21 ENCOUNTER — Other Ambulatory Visit: Payer: Self-pay

## 2020-11-29 ENCOUNTER — Ambulatory Visit: Payer: Self-pay

## 2020-11-29 ENCOUNTER — Telehealth: Payer: Self-pay | Admitting: Pharmacist

## 2020-11-29 DIAGNOSIS — R32 Unspecified urinary incontinence: Secondary | ICD-10-CM | POA: Diagnosis not present

## 2020-11-29 NOTE — Patient Outreach (Signed)
11/29/2020 Name: Teresa Mendez MRN: [id number] DOB: 10/07/1975  Referred by: [name], NP Reason for referral : No chief complaint on file.   A second unsuccessful telephone outreach was attempted today. The patient was referred to the case management team for assistance with care management and care coordination.    Follow Up Plan: A HIPAA compliant phone message was left for the patient providing contact information and requesting a return call. and The Managed Medicaid care management team will reach out to the patient again over the next 10 days.   Hughes Better PharmD, CPP High Risk Managed Medicaid [place] [phone]

## 2020-12-01 ENCOUNTER — Other Ambulatory Visit: Payer: Self-pay

## 2020-12-01 ENCOUNTER — Ambulatory Visit (HOSPITAL_COMMUNITY): Payer: Self-pay | Admitting: Licensed Clinical Social Worker

## 2020-12-01 ENCOUNTER — Ambulatory Visit (HOSPITAL_COMMUNITY): Payer: Medicaid Other | Admitting: Licensed Clinical Social Worker

## 2020-12-01 ENCOUNTER — Telehealth (HOSPITAL_COMMUNITY): Payer: Self-pay | Admitting: Licensed Clinical Social Worker

## 2020-12-01 NOTE — Telephone Encounter (Signed)
LCSW sent two links to pt phone at 1 and 105 with no response. LCSW f/u with a PC at 110 and pt answered. She asked for 5 minutes, LCSW was agreeable to 5 minutes. Pt joined the chat a 116. LCSW explained policy of [facility] of 15 minutes late to any appointment is considered a  "no call/no show". But because this was patient first time LCSW was agreeable to complete the assessment. Pt stated, "I am alright you seem to have an attitude". LCSW did explain that it was not an attitude that LCSW was trying to get across, it was the policies in place to make sure all patients seen are treated the same. Pt did not want medication management or therapy services. LCSW did provide the patient the option to complete assessment with this LCSW and then transfer to a different LCSW if she would like. Again, she declined but stated "I will keep that in mind".

## 2020-12-05 ENCOUNTER — Ambulatory Visit (HOSPITAL_COMMUNITY): Payer: Medicaid Other

## 2020-12-15 ENCOUNTER — Other Ambulatory Visit: Payer: Self-pay | Admitting: Obstetrics and Gynecology

## 2020-12-15 NOTE — Patient Outreach (Addendum)
Care Coordination  12/15/2020  Fiora Weill Dumond Nov 07, 1975 [id number]   Medicaid Managed Care   Unsuccessful Outreach Note  12/15/2020 Name: DAVIELLE LINGELBACH MRN: [id number] DOB: Nov 12, 1975  Referred by: [name], NP Reason for referral : High Risk Managed Medicaid (Unsuccessful telephone outreach)   Third unsuccessful telephone outreach was attempted today. The patient was referred to the case management team for assistance with care management and care coordination. The patient's primary care provider has been notified of our unsuccessful attempts to make or maintain contact with the patient. The care management team is pleased to engage with this patient at any time in the future should he/she be interested in assistance from the care management team.    Aida Raider RN, [place] Management Coordinator - Managed [facility] High Risk [phone].

## 2020-12-15 NOTE — Patient Instructions (Signed)
Hi Ms. Keeran, sorry I missed you today- as a part of your Medicaid benefit, you are eligible for care management and care coordination services at no cost or copay. I was unable to reach you by phone today but would be happy to help you with your health related needs. Please feel free to call me at [phone].  A member of the Managed Medicaid care management team will reach out to you again over the next 7-14 days.   Aida Raider RN, BSN [place]  [facility]ator - Managed Medicaid High Risk [phone].

## 2020-12-24 DIAGNOSIS — J45909 Unspecified asthma, uncomplicated: Secondary | ICD-10-CM | POA: Diagnosis not present

## 2020-12-24 DIAGNOSIS — D869 Sarcoidosis, unspecified: Secondary | ICD-10-CM | POA: Diagnosis not present

## 2020-12-24 DIAGNOSIS — J189 Pneumonia, unspecified organism: Secondary | ICD-10-CM | POA: Diagnosis not present

## 2020-12-28 ENCOUNTER — Ambulatory Visit: Payer: Medicaid Other | Admitting: Nurse Practitioner

## 2020-12-30 DIAGNOSIS — R32 Unspecified urinary incontinence: Secondary | ICD-10-CM | POA: Diagnosis not present

## 2021-01-02 ENCOUNTER — Ambulatory Visit: Payer: Medicaid Other

## 2021-01-05 ENCOUNTER — Ambulatory Visit: Payer: Medicaid Other | Admitting: Physician Assistant

## 2021-01-20 DIAGNOSIS — J45909 Unspecified asthma, uncomplicated: Secondary | ICD-10-CM | POA: Diagnosis not present

## 2021-01-20 DIAGNOSIS — D869 Sarcoidosis, unspecified: Secondary | ICD-10-CM | POA: Diagnosis not present

## 2021-01-20 DIAGNOSIS — J189 Pneumonia, unspecified organism: Secondary | ICD-10-CM | POA: Diagnosis not present

## 2021-01-23 DIAGNOSIS — D869 Sarcoidosis, unspecified: Secondary | ICD-10-CM | POA: Diagnosis not present

## 2021-01-23 DIAGNOSIS — J189 Pneumonia, unspecified organism: Secondary | ICD-10-CM | POA: Diagnosis not present

## 2021-01-23 DIAGNOSIS — J45909 Unspecified asthma, uncomplicated: Secondary | ICD-10-CM | POA: Diagnosis not present

## 2021-01-26 ENCOUNTER — Other Ambulatory Visit: Payer: Self-pay

## 2021-01-26 ENCOUNTER — Encounter: Payer: Self-pay | Admitting: Physician Assistant

## 2021-01-26 ENCOUNTER — Ambulatory Visit: Payer: Medicaid Other | Attending: Nurse Practitioner | Admitting: Physician Assistant

## 2021-01-26 VITALS — BP 116/80 | HR 97 | Ht 61.0 in | Wt 266.1 lb

## 2021-01-26 DIAGNOSIS — N951 Menopausal and female climacteric states: Secondary | ICD-10-CM | POA: Diagnosis not present

## 2021-01-26 DIAGNOSIS — N926 Irregular menstruation, unspecified: Secondary | ICD-10-CM

## 2021-01-26 DIAGNOSIS — D259 Leiomyoma of uterus, unspecified: Secondary | ICD-10-CM

## 2021-01-26 DIAGNOSIS — D649 Anemia, unspecified: Secondary | ICD-10-CM | POA: Diagnosis not present

## 2021-01-26 DIAGNOSIS — L853 Xerosis cutis: Secondary | ICD-10-CM | POA: Diagnosis not present

## 2021-01-26 MED ORDER — TRIAMCINOLONE ACETONIDE 0.1 % EX CREA
1.0000 "application " | TOPICAL_CREAM | Freq: Two times a day (BID) | CUTANEOUS | 1 refills | Status: DC
  Filled 2021-01-26: qty 45, 23d supply, fill #0

## 2021-01-26 NOTE — Progress Notes (Signed)
Patient ID: Teresa Mendez, female   DOB: February 05, 1975, 45 y.o.   MRN: [id number]   Teresa Mendez, is a 45 y.o. female  HEN:[id number]  TIR:[id number]  DOB - May 25, 1975  Chief Complaint  Patient presents with   Hypertension       Subjective:   Teresa Mendez is a 45 y.o. female here today for irregular bleeding.  Up until this month, her periods were regular and sometimes heavy.  Earlier in the month she had a period that seemed normal then it stopped then a few days later she started again then stopped then a few days later started again.  Some of these days it has been heavy enough to saturate 3 depends daily.  Some days only spotting.  She is on daily iron supplements.  No pelvic pain.  No urinary s/sx.  She has known uterine fibroids.  No hot flashes or night sweats.  She also has dry skin on her hands since the weather got cold.    No problems updated.  ALLERGIES: No Known Allergies  PAST MEDICAL HISTORY: Past Medical History:  Diagnosis Date   Asthma    Hypertension    Sarcoidosis    Sickle cell trait ([place])     MEDICATIONS AT HOME: Prior to Admission medications   Medication Sig Start Date End Date Taking? Authorizing Provider  albuterol (PROAIR HFA) 108 (90 Base) MCG/ACT inhaler INHALE 1-2 PUFFS INTO THE LUNGS EVERY 6 (SIX) HOURS AS NEEDED FOR WHEEZING OR SHORTNESS OF BREATH (COUGH). 08/29/20 08/29/21 Yes [name], NP  albuterol (PROVENTIL) (2.5 MG/3ML) 0.083% nebulizer solution TAKE 3 MLS (2.5 MG TOTAL) BY NEBULIZATION EVERY 6 (SIX) HOURS AS NEEDED FOR WHEEZING OR SHORTNESS OF BREATH. 07/08/20 07/08/21 Yes Hunsucker, Bonna Gains, MD  aspirin EC 81 MG tablet Take 81 mg by mouth daily.   Yes [provider]  atorvastatin (LIPITOR) 80 MG tablet TAKE 1 TABLET (80 MG TOTAL) BY MOUTH DAILY. 08/29/20 08/29/21 Yes [name], NP  Budeson-Glycopyrrol-Formoterol (BREZTRI AEROSPHERE) 160-9-4.8 MCG/ACT AERO Inhale 2 puffs into the lungs in the morning and at bedtime. 06/20/20   Yes Hunsucker, Bonna Gains, MD  Budeson-Glycopyrrol-Formoterol (BREZTRI AEROSPHERE) 160-9-4.8 MCG/ACT AERO Inhale 2 puffs into the lungs in the morning and at bedtime. 06/20/20  Yes Hunsucker, Bonna Gains, MD  cetirizine (ZYRTEC) 10 MG tablet TAKE 1 TABLET (10 MG TOTAL) BY MOUTH DAILY. 03/04/20 03/04/21 Yes [name], NP  DULoxetine (CYMBALTA) 60 MG capsule TAKE 1 CAPSULE (60 MG TOTAL) BY MOUTH DAILY. 08/29/20 08/29/21 Yes [name], NP  famotidine (PEPCID) 20 MG tablet Take 1 tablet (20 mg total) by mouth at bedtime. 03/23/20  Yes [name], NP  fluticasone [facility]) 50 MCG/ACT nasal spray Place 2 sprays into both nostrils daily. 08/07/19  Yes [name], NP  lisinopril (ZESTRIL) 40 MG tablet TAKE 1 TABLET (40 MG TOTAL) BY MOUTH DAILY. 08/29/20 08/29/21 Yes [name], NP  Masks MISC Nebulizer face mask Q76.195 08/29/20  Yes [name], NP  montelukast (SINGULAIR) 10 MG tablet TAKE 1 TABLET (10 MG TOTAL) BY MOUTH AT BEDTIME. 07/08/20 07/08/21 Yes Hunsucker, Bonna Gains, MD  omeprazole (PRILOSEC) 20 MG capsule TAKE 1 CAPSULE (20 MG TOTAL) BY MOUTH DAILY. 11/17/20 11/17/21 Yes Newlin, Enobong, MD  ondansetron (ZOFRAN-ODT) 4 MG disintegrating tablet PLACE ON THE TONGUE TO DISSOLVE. DO NOT SWALLOW. TAKE ONE TABLET BY MOUTH EVERY 8 HOURS AS NEEDED FOR NAUSEA OR VOMITING. 04/04/20 04/04/21 Yes [name], NP  OXYGEN Inhale into the lungs. 3 liters   Yes [provider]  potassium chloride SA (KLOR-CON) 20 MEQ tablet TAKE 1 TABLET (20 MEQ TOTAL) BY MOUTH DAILY FOR 14 DAYS. 08/29/20 08/29/21 Yes [name], NP  senna-docusate (SENOKOT-S) 8.6-50 MG tablet TAKE 2 TABLETS BY MOUTH DAILY. 03/25/20 03/25/21 Yes [name], NP  triamcinolone cream (KENALOG) 0.1 % Apply 1 application topically 2 (two) times daily. 01/26/21  Yes Zimal Weisensel M, PA-C  amLODipine (NORVASC) 10 MG tablet TAKE 1 TABLET (10 MG TOTAL) BY MOUTH DAILY. 11/06/19 12/14/20  [name], NP  carvedilol  (COREG) 3.125 MG tablet TAKE 1 TABLET (3.125 MG TOTAL) BY MOUTH 2 (TWO) TIMES DAILY WITH A MEAL. 11/06/19 12/14/20  [name], NP  ferrous sulfate 325 (65 FE) MG tablet Take 1 tablet (325 mg total) by mouth daily with breakfast. 07/08/20 08/07/20  [name], NP  hydrochlorothiazide (HYDRODIURIL) 25 MG tablet TAKE 1 TABLET (25 MG TOTAL) BY MOUTH DAILY. 11/06/19 11/05/20  [name], NP    ROS: Neg HEENT Neg resp Neg cardiac Neg GI Neg GU Neg MS Neg psych Neg neuro  Objective:   Vitals:   01/26/21 0932  BP: 116/80  Pulse: 97  SpO2: 100%  Weight: 266 lb 2 oz (120.7 kg)  Height: 5\' 1"  (1.549 m)   Exam General appearance : Awake, alert, not in any distress. Speech Clear. Not toxic looking, on O2 HEENT: Atraumatic and Normocephalic Neck: Supple, no JVD. No cervical lymphadenopathy.  Chest: Good air entry bilaterally, CTAB.  No rales/rhonchi/wheezing CVS: S1 S2 regular, no murmurs.  Abdomen: Bowel sounds present, Non tender and not distended with no gaurding, rigidity or rebound. Extremities: B/L Lower Ext shows no edema, both legs are warm to touch Neurology: Awake alert, and oriented X 3, CN II-XII intact, Non focal Skin: No Rash  Data Review Lab Results  Component Value Date   HGBA1C 5.2 11/06/2019   HGBA1C 5.0 08/22/2016    Assessment & Plan   1. Uterine leiomyoma, unspecified location Known- - Ambulatory referral to Gynecology  2. Irregular bleeding If becomes heavier or sever-go to ED.  Patient verbalizes understanding.  She is hemodynamically stable today - Thyroid Panel With TSH - FSH/LH - Ambulatory referral to Gynecology  3. Perimenopausal - Thyroid Panel With TSH - FSH/LH - Ambulatory referral to Gynecology  4. Anemia, unspecified type On daily iron  - CBC with Differential/Platelet - Iron, TIBC and Ferritin Panel  5. Dry skin - Thyroid Panel With TSH - triamcinolone cream (KENALOG) 0.1 %; Apply 1 application topically 2 (two)  times daily.  Dispense: 45 g; Refill: 1    Patient have been counseled extensively about nutrition and exercise. Other issues discussed during this visit include: low cholesterol diet, weight control and daily exercise, foot care, annual eye examinations at Ophthalmology, importance of adherence with medications and regular follow-up. We also discussed long term complications of uncontrolled diabetes and hypertension.   Return in about 3 months (around 04/26/2021) for PCP for chronic dz managemant.  The patient was given clear instructions to go to ER or return to medical center if symptoms don't improve, worsen or new problems develop. The patient verbalized understanding. The patient was told to call to get lab results if they haven't heard anything in the next week.      Freeman Caldron, PA-C [facility] and [facility] [place], [place]   01/26/2021, 10:10 AM

## 2021-01-26 NOTE — Progress Notes (Signed)
Bleeding heavy and dry skin

## 2021-01-27 LAB — IRON,TIBC AND FERRITIN PANEL
Ferritin: 17 ng/mL (ref 15–150)
Iron Saturation: 12 % — ABNORMAL LOW (ref 15–55)
Iron: 45 ug/dL (ref 27–159)
Total Iron Binding Capacity: 385 ug/dL (ref 250–450)
UIBC: 340 ug/dL (ref 131–425)

## 2021-01-27 LAB — CBC WITH DIFFERENTIAL/PLATELET
Basophils Absolute: 0 10*3/uL (ref 0.0–0.2)
Basos: 0 %
EOS (ABSOLUTE): 0.2 10*3/uL (ref 0.0–0.4)
Eos: 2 %
Hematocrit: 29.8 % — ABNORMAL LOW (ref 34.0–46.6)
Hemoglobin: 9.2 g/dL — ABNORMAL LOW (ref 11.1–15.9)
Immature Grans (Abs): 0 10*3/uL (ref 0.0–0.1)
Immature Granulocytes: 0 %
Lymphocytes Absolute: 2.6 10*3/uL (ref 0.7–3.1)
Lymphs: 28 %
MCH: 23.4 pg — ABNORMAL LOW (ref 26.6–33.0)
MCHC: 30.9 g/dL — ABNORMAL LOW (ref 31.5–35.7)
MCV: 76 fL — ABNORMAL LOW (ref 79–97)
Monocytes Absolute: 0.7 10*3/uL (ref 0.1–0.9)
Monocytes: 8 %
Neutrophils Absolute: 5.8 10*3/uL (ref 1.4–7.0)
Neutrophils: 62 %
Platelets: 454 10*3/uL — ABNORMAL HIGH (ref 150–450)
RBC: 3.94 x10E6/uL (ref 3.77–5.28)
RDW: 14.9 % (ref 11.7–15.4)
WBC: 9.3 10*3/uL (ref 3.4–10.8)

## 2021-01-27 LAB — THYROID PANEL WITH TSH
Free Thyroxine Index: 2.7 (ref 1.2–4.9)
T3 Uptake Ratio: 25 % (ref 24–39)
T4, Total: 10.7 ug/dL (ref 4.5–12.0)
TSH: 0.788 u[IU]/mL (ref 0.450–4.500)

## 2021-01-27 LAB — FSH/LH
FSH: 19.3 m[IU]/mL
LH: 7.9 m[IU]/mL

## 2021-01-30 ENCOUNTER — Other Ambulatory Visit: Payer: Self-pay | Admitting: Nurse Practitioner

## 2021-01-30 DIAGNOSIS — R32 Unspecified urinary incontinence: Secondary | ICD-10-CM | POA: Diagnosis not present

## 2021-01-30 MED ORDER — TRIAMCINOLONE ACETONIDE 0.1 % EX OINT
1.0000 "application " | TOPICAL_OINTMENT | Freq: Two times a day (BID) | CUTANEOUS | 0 refills | Status: DC
  Filled 2021-01-30 – 2021-02-04 (×3): qty 60, 30d supply, fill #0

## 2021-01-31 ENCOUNTER — Other Ambulatory Visit: Payer: Self-pay | Admitting: Nurse Practitioner

## 2021-01-31 ENCOUNTER — Other Ambulatory Visit: Payer: Self-pay

## 2021-01-31 MED FILL — Cetirizine HCl Tab 10 MG: ORAL | 30 days supply | Qty: 30 | Fill #4 | Status: CN

## 2021-01-31 MED FILL — Sennosides-Docusate Sodium Tab 8.6-50 MG: ORAL | 30 days supply | Qty: 60 | Fill #0 | Status: CN

## 2021-02-01 ENCOUNTER — Other Ambulatory Visit: Payer: Self-pay

## 2021-02-04 ENCOUNTER — Other Ambulatory Visit: Payer: Self-pay

## 2021-02-04 ENCOUNTER — Encounter: Payer: Self-pay | Admitting: *Deleted

## 2021-02-04 MED FILL — Cetirizine HCl Tab 10 MG: ORAL | 30 days supply | Qty: 30 | Fill #0 | Status: AC

## 2021-02-10 ENCOUNTER — Other Ambulatory Visit: Payer: Self-pay

## 2021-02-10 ENCOUNTER — Encounter: Payer: Self-pay | Admitting: Physician Assistant

## 2021-02-12 MED ORDER — MASKS MISC: 0 refills | Status: DC

## 2021-02-13 ENCOUNTER — Other Ambulatory Visit: Payer: Self-pay

## 2021-02-14 DIAGNOSIS — J45909 Unspecified asthma, uncomplicated: Secondary | ICD-10-CM | POA: Diagnosis not present

## 2021-02-17 DIAGNOSIS — J9611 Chronic respiratory failure with hypoxia: Secondary | ICD-10-CM | POA: Diagnosis not present

## 2021-02-23 DIAGNOSIS — J189 Pneumonia, unspecified organism: Secondary | ICD-10-CM | POA: Diagnosis not present

## 2021-02-23 DIAGNOSIS — D869 Sarcoidosis, unspecified: Secondary | ICD-10-CM | POA: Diagnosis not present

## 2021-02-23 DIAGNOSIS — J45909 Unspecified asthma, uncomplicated: Secondary | ICD-10-CM | POA: Diagnosis not present

## 2021-03-02 DIAGNOSIS — R32 Unspecified urinary incontinence: Secondary | ICD-10-CM | POA: Diagnosis not present

## 2021-03-08 DIAGNOSIS — J439 Emphysema, unspecified: Secondary | ICD-10-CM | POA: Diagnosis not present

## 2021-03-08 DIAGNOSIS — D869 Sarcoidosis, unspecified: Secondary | ICD-10-CM | POA: Diagnosis not present

## 2021-03-08 DIAGNOSIS — J849 Interstitial pulmonary disease, unspecified: Secondary | ICD-10-CM | POA: Diagnosis not present

## 2021-03-17 ENCOUNTER — Telehealth: Payer: Self-pay | Admitting: Nurse Practitioner

## 2021-03-17 NOTE — Telephone Encounter (Signed)
Caller checking on the status if [employer] or [employer] forms requesting personal care services faxed to [phone] were received. Caller will fax again to [phone] note when received and fax back to [facility] fax # [phone].

## 2021-03-21 ENCOUNTER — Telehealth: Payer: Self-pay

## 2021-03-21 NOTE — Telephone Encounter (Signed)
Spoke to insurance and they are faxing over form to be filled out and fax back.

## 2021-03-26 DIAGNOSIS — J45909 Unspecified asthma, uncomplicated: Secondary | ICD-10-CM | POA: Diagnosis not present

## 2021-03-26 DIAGNOSIS — J189 Pneumonia, unspecified organism: Secondary | ICD-10-CM | POA: Diagnosis not present

## 2021-03-26 DIAGNOSIS — D869 Sarcoidosis, unspecified: Secondary | ICD-10-CM | POA: Diagnosis not present

## 2021-03-28 ENCOUNTER — Other Ambulatory Visit: Payer: Self-pay

## 2021-03-28 ENCOUNTER — Encounter: Payer: Self-pay | Admitting: Nurse Practitioner

## 2021-03-28 ENCOUNTER — Other Ambulatory Visit: Payer: Self-pay | Admitting: Nurse Practitioner

## 2021-03-28 ENCOUNTER — Telehealth: Payer: Self-pay | Admitting: Nurse Practitioner

## 2021-03-28 ENCOUNTER — Telehealth (HOSPITAL_BASED_OUTPATIENT_CLINIC_OR_DEPARTMENT_OTHER): Payer: Medicaid Other | Admitting: Nurse Practitioner

## 2021-03-28 DIAGNOSIS — K219 Gastro-esophageal reflux disease without esophagitis: Secondary | ICD-10-CM

## 2021-03-28 DIAGNOSIS — N393 Stress incontinence (female) (male): Secondary | ICD-10-CM

## 2021-03-28 DIAGNOSIS — Z0289 Encounter for other administrative examinations: Secondary | ICD-10-CM

## 2021-03-28 DIAGNOSIS — J40 Bronchitis, not specified as acute or chronic: Secondary | ICD-10-CM

## 2021-03-28 DIAGNOSIS — D869 Sarcoidosis, unspecified: Secondary | ICD-10-CM | POA: Diagnosis not present

## 2021-03-28 MED ORDER — FAMOTIDINE 20 MG PO TABS: 20.0000 mg | ORAL_TABLET | Freq: Every day | ORAL | 3 refills | Status: DC

## 2021-03-28 MED ORDER — PREDNISONE 20 MG PO TABS
20.0000 mg | ORAL_TABLET | Freq: Every day | ORAL | 0 refills | Status: AC
  Filled 2021-03-28: qty 5, 5d supply, fill #0

## 2021-03-28 NOTE — Telephone Encounter (Signed)
No answer lvm

## 2021-03-28 NOTE — Progress Notes (Addendum)
Virtual Visit via Telephone Note Due to national recommendations of social distancing due to [place] 19, telehealth visit is felt to be most appropriate for this patient at this time.  I discussed the limitations, risks, security and privacy concerns of performing an evaluation and management service by telephone and the availability of in person appointments. I also discussed with the patient that there may be a patient responsible charge related to this service. The patient expressed understanding and agreed to proceed.    I connected with Teresa Mendez on 03/28/21  at  11:10 AM EST  EDT by telephone and verified that I am speaking with the correct person using two identifiers.  Location of Patient: Private Residence   Location of Provider: [place] and [place] participating in Telemedicine visit: Geryl Rankins FNP-BC SITA MANGEN    History of Present Illness: Telemedicine visit for: stress incontinence. 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   Urinary Incontinence Patient complains of urinary incontinence. This has been present for several years. She leaks urine with bending, coughing, lifting, standing, walking, sneezing, with urge, with a full bladder, during the night. Patient describes the symptoms as  urge to urinate with little or no warning, urine leakage with coughing/heavy physical activity and urine leaking unpredictably.  Factors associated with symptoms include mulitparous, Morbid obesity, diuretic medication. Evaluation to date includes UA/CS: normal.   Acute Bronchitis: Patient presents for presents evaluation of productive cough with sputum described as tenacious. Symptoms began a few weeks ago and are unchanged since that time.  Past history is significant for asthma, COPD, and  chronic O2 2-4L . She is using inhalers and nebs as prescribed with only minimal relief of cough and shortness of breath.   Past Medical History:  Diagnosis Date   Asthma    Hypertension    Sarcoidosis    Sickle cell trait ([place])    Stress incontinence     Past Surgical History:  Procedure Laterality Date   BREAST SURGERY Right    biopsy   TUBAL LIGATION      Family History  Problem Relation Age of Onset   Hypertension Mother    Congestive Heart Failure Mother    Kidney failure Mother    Sickle cell trait Mother    Hypertension Father    Congestive Heart Failure Father    Kidney failure Father    Glaucoma Father    Hypertension Brother    Liver disease Brother    Sickle cell trait Son    Sickle cell trait Daughter    Sickle cell trait Daughter     Social History   Socioeconomic History   Marital status: Single    Spouse name: Not on file   Number of children: Not on file   Years of education: Not on file   Highest education level: Not on file  Occupational History   Not on file  Tobacco Use   Smoking status: Former    Packs/day: 0.50    Types: Cigarettes    Quit date: 01/02/2019    Years since quitting: 2.2   Smokeless tobacco: Never  Vaping Use   Vaping Use: Former  Substance and Sexual Activity   Alcohol use: Yes    Comment: Occasionally    Drug use: No   Sexual activity: Yes  Other Topics  Concern   Not on file  Social History Narrative   Not on file   Social Determinants of Health   Financial Resource Strain: Not on file  Food Insecurity: No Food Insecurity   Worried About Running Out of Food in the Last Year: Never true   Ran Out of Food in the Last Year: Never true  Transportation Needs: No Transportation Needs   Lack of Transportation (Medical): No   Lack of Transportation (Non-Medical): No  Physical Activity: Inactive   Days of Exercise per Week: 0 days   Minutes of Exercise per Session: 0 min  Stress: Not on file  Social Connections: Not  on file     Observations/Objective: Awake, alert and oriented x 3   Review of Systems  Constitutional:  Negative for fever, malaise/fatigue and weight loss.  HENT: Negative.  Negative for nosebleeds.   Eyes: Negative.  Negative for blurred vision, double vision and photophobia.  Respiratory:  Positive for cough, sputum production and shortness of breath. Negative for hemoptysis and wheezing.   Cardiovascular: Negative.  Negative for chest pain, palpitations and leg swelling.  Gastrointestinal: Negative.  Negative for heartburn, nausea and vomiting.  Musculoskeletal: Negative.  Negative for myalgias.  Neurological: Negative.  Negative for dizziness, focal weakness, seizures and headaches.  Psychiatric/Behavioral: Negative.  Negative for suicidal ideas.    Assessment and Plan: Diagnoses and all orders for this visit:  Stress incontinence of urine Encounter for completion of form with patient Form completed for incontinence supplies  PULMONARY SARCOIDOSIS -     predniSONE (DELTASONE) 20 MG tablet; Take 1 tablet (20 mg total) by mouth daily with breakfast for 5 days.  Bronchitis -     predniSONE (DELTASONE) 20 MG tablet; Take 1 tablet (20 mg total) by mouth daily with breakfast for 5 days.  Gastroesophageal reflux disease without esophagitis -     famotidine (PEPCID) 20 MG tablet; Take 1 tablet (20 mg total) by mouth at bedtime. INSTRUCTIONS: Avoid GERD Triggers: acidic, spicy or fried foods, caffeine, coffee, sodas,  alcohol and chocolate.       Follow Up Instructions Return if symptoms worsen or fail to improve.     I discussed the assessment and treatment plan with the patient. The patient was provided an opportunity to ask questions and all were answered. The patient agreed with the plan and demonstrated an understanding of the instructions.   The patient was advised to call back or seek an in-person evaluation if the symptoms worsen or if the condition fails to improve as  anticipated.  I provided 11 minutes of non-face-to-face time during this encounter including median intraservice time, reviewing previous notes, labs, imaging, medications and explaining diagnosis and management.  Teresa Pounds, FNP-BC

## 2021-03-29 ENCOUNTER — Other Ambulatory Visit: Payer: Self-pay | Admitting: Pharmacist

## 2021-03-29 ENCOUNTER — Other Ambulatory Visit: Payer: Self-pay

## 2021-03-29 MED ORDER — ALBUTEROL SULFATE HFA 108 (90 BASE) MCG/ACT IN AERS
1.0000 | INHALATION_SPRAY | Freq: Four times a day (QID) | RESPIRATORY_TRACT | 0 refills | Status: DC | PRN
Start: 2021-03-29 — End: 2021-06-13
  Filled 2021-03-29: qty 18, 25d supply, fill #0

## 2021-03-29 MED ORDER — CETIRIZINE HCL 10 MG PO TABS
ORAL_TABLET | Freq: Every day | ORAL | 0 refills | Status: DC
  Filled 2021-03-29: qty 30, 30d supply, fill #0

## 2021-03-29 MED ORDER — CARVEDILOL 3.125 MG PO TABS
ORAL_TABLET | Freq: Two times a day (BID) | ORAL | 0 refills | Status: DC
  Filled 2021-03-29: qty 60, 30d supply, fill #0

## 2021-03-29 MED ORDER — DULOXETINE HCL 60 MG PO CPEP
ORAL_CAPSULE | Freq: Every day | ORAL | 0 refills | Status: DC
  Filled 2021-03-29: qty 30, 30d supply, fill #0

## 2021-03-29 MED ORDER — HYDROCHLOROTHIAZIDE 25 MG PO TABS
ORAL_TABLET | Freq: Every day | ORAL | 0 refills | Status: DC
  Filled 2021-03-29: qty 30, 30d supply, fill #0

## 2021-03-29 MED ORDER — ATORVASTATIN CALCIUM 80 MG PO TABS
ORAL_TABLET | Freq: Every day | ORAL | 0 refills | Status: DC
  Filled 2021-03-29: qty 30, 30d supply, fill #0

## 2021-03-29 MED ORDER — ALBUTEROL SULFATE HFA 108 (90 BASE) MCG/ACT IN AERS
INHALATION_SPRAY | RESPIRATORY_TRACT | 0 refills | Status: DC
  Filled 2021-03-29: qty 8.5, 25d supply, fill #0

## 2021-03-29 MED ORDER — AMLODIPINE BESYLATE 10 MG PO TABS
ORAL_TABLET | Freq: Every day | ORAL | 0 refills | Status: DC
  Filled 2021-03-29: qty 30, 30d supply, fill #0

## 2021-03-29 NOTE — Telephone Encounter (Signed)
Requested medication (s) are due for refill today:   Yes for all 8  Requested medication (s) are on the active medication list:   Yes for all 8  Future visit scheduled:   Yes 04/28/2021 with Zelda   Last ordered: HCTZ 11/06/2019 #90, 1 refill; Zyrtec 11/21/2020 #30, 11 refills Coreg 11/06/2019 #180, 1 refill Amlodipine 11/06/2019 #90, 1 refill Potassium 08/29/2020 #14, 0 refills Albuterol inhaler 08/29/2020 8.5 g, 0 refills Cymbalta 08/29/2020 #90, 0 refills Lipitor 08/29/2020 #90, 0 refills  Returned because protocol criteria not met.   Labs due.     Requested Prescriptions  Pending Prescriptions Disp Refills   hydrochlorothiazide (HYDRODIURIL) 25 MG tablet 90 tablet 2    Sig: TAKE 1 TABLET (25 MG TOTAL) BY MOUTH DAILY.     Cardiovascular: Diuretics - Thiazide Failed - 03/28/2021  2:42 PM      Failed - Cr in normal range and within 180 days    Creat  Date Value Ref Range Status  07/03/2019 0.85 0.50 - 1.10 mg/dL Final   Creatinine, Ser  Date Value Ref Range Status  03/25/2020 0.68 0.57 - 1.00 mg/dL Final    Comment:                   **Effective March 28, 2020 [facility] will begin**                  reporting the 2021 CKD-EPI creatinine equation that                  estimates kidney function without a race variable.    Creatinine, POC  Date Value Ref Range Status  08/22/2016 200 mg/dL Final          Failed - K in normal range and within 180 days    Potassium  Date Value Ref Range Status  03/25/2020 3.2 (L) 3.5 - 5.2 mmol/L Final          Failed - Na in normal range and within 180 days    Sodium  Date Value Ref Range Status  03/25/2020 137 134 - 144 mmol/L Final          Passed - Last BP in normal range    BP Readings from Last 1 Encounters:  01/26/21 116/80          Passed - Valid encounter within last 6 months    Recent Outpatient Visits           Yesterday Stress incontinence of urine   [place], Vernia Buff, NP   2  months ago Uterine leiomyoma, unspecified location   [place], [place], [place]   4 months ago [place], Vernia Buff, NP   5 months ago Encounter for completion of form with patient   [place], Vernia Buff, NP   9 months ago Essential hypertension   [place], Vernia Buff, NP       Future Appointments             In 1 month [name], NP [place]             cetirizine (ZYRTEC) 10 MG tablet 30 tablet 11    Sig: TAKE 1 TABLET (10 MG TOTAL) BY MOUTH DAILY.     Ear, Nose, and Throat:  Antihistamines 2 Failed - 03/28/2021  2:42 PM      Failed - Cr in normal range and within 360 days    Creat  Date Value Ref Range Status  07/03/2019 0.85 0.50 - 1.10 mg/dL Final   Creatinine, Ser  Date Value Ref Range Status  03/25/2020 0.68 0.57 - 1.00 mg/dL Final    Comment:                   **Effective March 28, 2020 [facility] will begin**                  reporting the 2021 CKD-EPI creatinine equation that                  estimates kidney function without a race variable.    Creatinine, POC  Date Value Ref Range Status  08/22/2016 200 mg/dL Final          Passed - Valid encounter within last 12 months    Recent Outpatient Visits           Yesterday Stress incontinence of urine   [place], Vernia Buff, NP   2 months ago Uterine leiomyoma, unspecified location   [place], [place], [place]   4 months ago [place], Vernia Buff, NP   5 months ago Encounter for completion of form with patient   [place], Vernia Buff, NP   9 months ago Essential hypertension   [place], Vernia Buff, NP       Future Appointments             In 1 month [name], NP [place]             carvedilol (COREG) 3.125 MG tablet 180 tablet 1    Sig: TAKE 1 TABLET (3.125 MG TOTAL) BY MOUTH 2 (TWO) TIMES DAILY WITH A MEAL.     Cardiovascular: Beta Blockers 3 Failed - 03/28/2021  2:42 PM      Failed - Cr in normal range and within 360 days    Creat  Date Value Ref Range Status  07/03/2019 0.85 0.50 - 1.10 mg/dL Final   Creatinine, Ser  Date Value Ref Range Status  03/25/2020 0.68 0.57 - 1.00 mg/dL Final    Comment:                   **Effective March 28, 2020 [facility] will begin**                  reporting the 2021 CKD-EPI creatinine equation that                  estimates kidney function without a race variable.    Creatinine, POC  Date Value Ref Range Status  08/22/2016 200 mg/dL Final          Failed - AST in normal range and within 360 days    AST  Date Value Ref Range Status  03/25/2020 16 0 - 40 IU/L Final          Failed - ALT in normal range and within 360 days    ALT  Date Value Ref Range Status  03/25/2020 12 0 - 32 IU/L Final          Passed -  Last BP in normal range    BP Readings from Last 1 Encounters:  01/26/21 116/80          Passed - Last Heart Rate in normal range    Pulse Readings from Last 1 Encounters:  01/26/21 97          Passed - Valid encounter within last 6 months    Recent Outpatient Visits           Yesterday Stress incontinence of urine   [place], Vernia Buff, NP   2 months ago Uterine leiomyoma, unspecified location   [place], [place], [place]   4 months ago [place], Vernia Buff, NP   5 months ago Encounter for completion of form with patient   [place], Vernia Buff, NP   9 months ago  Essential hypertension   [place], Vernia Buff, NP       Future Appointments             In 1 month [name], NP [place]             amLODipine (NORVASC) 10 MG tablet 90 tablet 1    Sig: TAKE 1 TABLET (10 MG TOTAL) BY MOUTH DAILY.     Cardiovascular: Calcium Channel Blockers 2 Passed - 03/28/2021  2:42 PM      Passed - Last BP in normal range    BP Readings from Last 1 Encounters:  01/26/21 116/80          Passed - Last Heart Rate in normal range    Pulse Readings from Last 1 Encounters:  01/26/21 97          Passed - Valid encounter within last 6 months    Recent Outpatient Visits           Yesterday Stress incontinence of urine   [place], Vernia Buff, NP   2 months ago Uterine leiomyoma, unspecified location   [place], [place], [place]   4 months ago [place], Vernia Buff, NP   5 months ago Encounter for completion of form with patient   [place], Vernia Buff, NP   9 months ago Essential hypertension   [place], Vernia Buff, NP       Future Appointments             In 1 month [name], NP [place]             potassium chloride SA (KLOR-CON M) 20 MEQ tablet 14 tablet 0    Sig: TAKE 1 TABLET (20 MEQ TOTAL) BY MOUTH DAILY FOR 14 DAYS.     Endocrinology:  Minerals - Potassium Supplementation Failed - 03/28/2021  2:42 PM      Failed - K in normal range and within 360 days    Potassium  Date Value Ref Range Status  03/25/2020 3.2 (L) 3.5 - 5.2 mmol/L Final          Failed - Cr in normal range and within 360 days    Creat  Date Value Ref Range Status  07/03/2019  0.85 0.50 - 1.10 mg/dL Final   Creatinine, Ser  Date Value  Ref Range Status  03/25/2020 0.68 0.57 - 1.00 mg/dL Final    Comment:                   **Effective March 28, 2020 [facility] will begin**                  reporting the 2021 CKD-EPI creatinine equation that                  estimates kidney function without a race variable.    Creatinine, POC  Date Value Ref Range Status  08/22/2016 200 mg/dL Final          Passed - Valid encounter within last 12 months    Recent Outpatient Visits           Yesterday Stress incontinence of urine   [place], Vernia Buff, NP   2 months ago Uterine leiomyoma, unspecified location   [place], [place], [place]   4 months ago [place], Vernia Buff, NP   5 months ago Encounter for completion of form with patient   [place], Vernia Buff, NP   9 months ago Essential hypertension   [place], Vernia Buff, NP       Future Appointments             In 1 month [name], NP [place]             albuterol (PROAIR HFA) 108 (90 Base) MCG/ACT inhaler 8.5 g 0    Sig: INHALE 1-2 PUFFS INTO THE LUNGS EVERY 6 (SIX) HOURS AS NEEDED FOR WHEEZING OR SHORTNESS OF BREATH (COUGH).     Pulmonology:  Beta Agonists 2 Passed - 03/28/2021  2:42 PM      Passed - Last BP in normal range    BP Readings from Last 1 Encounters:  01/26/21 116/80          Passed - Last Heart Rate in normal range    Pulse Readings from Last 1 Encounters:  01/26/21 97          Passed - Valid encounter within last 12 months    Recent Outpatient Visits           Yesterday Stress incontinence of urine   [place], Vernia Buff, NP   2 months ago Uterine leiomyoma, unspecified location   [place], [employer],  [place]   4 months ago [place], Vernia Buff, NP   5 months ago Encounter for completion of form with patient   [place], Vernia Buff, NP   9 months ago Essential hypertension   [place], Vernia Buff, NP       Future Appointments             In 1 month [name], NP [place]             DULoxetine (CYMBALTA) 60 MG capsule 90 capsule 0    Sig: TAKE 1 CAPSULE (60 MG TOTAL) BY MOUTH DAILY.  Psychiatry: Antidepressants - SNRI - duloxetine Failed - 03/28/2021  2:42 PM      Failed - Cr in normal range and within 360 days    Creat  Date Value Ref Range Status  07/03/2019 0.85 0.50 - 1.10 mg/dL Final   Creatinine, Ser  Date Value Ref Range Status  03/25/2020 0.68 0.57 - 1.00 mg/dL Final    Comment:                   **Effective March 28, 2020 [facility] will begin**                  reporting the 2021 CKD-EPI creatinine equation that                  estimates kidney function without a race variable.    Creatinine, POC  Date Value Ref Range Status  08/22/2016 200 mg/dL Final          Failed - eGFR is 30 or above and within 360 days    GFR calc Af Amer  Date Value Ref Range Status  03/25/2020 121 >59 mL/min/1.73 Final    Comment:    **In accordance with recommendations from the NKF-ASN Task force,**   [facility] is in the process of updating its eGFR calculation to the   2021 CKD-EPI creatinine equation that estimates kidney function   without a race variable.    GFR, Estimated  Date Value Ref Range Status  11/27/2019 >60 >60 mL/min Final    Comment:    (NOTE) Calculated using the CKD-EPI Creatinine Equation (2021)    GFR calc non Af Amer  Date Value Ref Range Status  03/25/2020 105 >59 mL/min/1.73 Final          Passed - Completed PHQ-2 or PHQ-9 in the last 360 days      Passed - Last  BP in normal range    BP Readings from Last 1 Encounters:  01/26/21 116/80          Passed - Valid encounter within last 6 months    Recent Outpatient Visits           Yesterday Stress incontinence of urine   [place], Vernia Buff, NP   2 months ago Uterine leiomyoma, unspecified location   [place], [place], [place]   4 months ago [place], Vernia Buff, NP   5 months ago Encounter for completion of form with patient   [place], Vernia Buff, NP   9 months ago Essential hypertension   [place], NP       Future Appointments             In 1 month [name], NP [place]             atorvastatin (LIPITOR) 80 MG tablet 90 tablet 0    Sig: TAKE 1 TABLET (80 MG TOTAL) BY MOUTH DAILY.     Cardiovascular:  Antilipid - Statins Failed - 03/28/2021  2:42 PM      Failed - Lipid Panel in normal range within the last 12 months    Cholesterol, Total  Date Value Ref Range Status  11/06/2019 201 (H) 100 - 199 mg/dL Final   LDL Chol Calc (NIH)  Date Value Ref Range  Status  11/06/2019 134 (H) 0 - 99 mg/dL Final   HDL  Date Value Ref Range Status  11/06/2019 47 >39 mg/dL Final   Triglycerides  Date Value Ref Range Status  11/06/2019 113 0 - 149 mg/dL Final         Passed - Patient is not pregnant      Passed - Valid encounter within last 12 months    Recent Outpatient Visits           Yesterday Stress incontinence of urine   [place], Vernia Buff, NP   2 months ago Uterine leiomyoma, unspecified location   [place], [place], [place]   4 months ago [place], Vernia Buff, NP   5  months ago Encounter for completion of form with patient   [place], Vernia Buff, NP   9 months ago Essential hypertension   [place], Vernia Buff, NP       Future Appointments             In 1 month [name], NP [place]

## 2021-03-31 ENCOUNTER — Institutional Professional Consult (permissible substitution) (HOSPITAL_BASED_OUTPATIENT_CLINIC_OR_DEPARTMENT_OTHER): Payer: Medicaid Other | Admitting: Obstetrics & Gynecology

## 2021-04-02 DIAGNOSIS — R32 Unspecified urinary incontinence: Secondary | ICD-10-CM | POA: Diagnosis not present

## 2021-04-03 ENCOUNTER — Other Ambulatory Visit: Payer: Self-pay

## 2021-04-05 ENCOUNTER — Other Ambulatory Visit: Payer: Self-pay

## 2021-04-23 DIAGNOSIS — D869 Sarcoidosis, unspecified: Secondary | ICD-10-CM | POA: Diagnosis not present

## 2021-04-23 DIAGNOSIS — J189 Pneumonia, unspecified organism: Secondary | ICD-10-CM | POA: Diagnosis not present

## 2021-04-23 DIAGNOSIS — J45909 Unspecified asthma, uncomplicated: Secondary | ICD-10-CM | POA: Diagnosis not present

## 2021-04-28 ENCOUNTER — Ambulatory Visit: Payer: Medicaid Other | Admitting: Nurse Practitioner

## 2021-05-03 DIAGNOSIS — R32 Unspecified urinary incontinence: Secondary | ICD-10-CM | POA: Diagnosis not present

## 2021-05-03 NOTE — Progress Notes (Signed)
? ?Office Visit Note ? ?Patient: Teresa Mendez             ?Date of Birth: February 02, 1975           ?MRN: [id number]             ?PCP: [name], NP ?Referring: [name], NP ?Visit Date: 05/04/2021 ?Occupation: @GUAROCC @ ? ?Subjective:  ?Right knee pain, and lower back pain. ? ?History of Present Illness: Teresa Mendez is a 46 y.o. female with history of osteoarthritis and pulmonary sarcoidosis.  She returns today after her initial visit in May 2022.  She states for the last 3 weeks she has been having increased pain in her right knee and lower back.  She denies any history of injury.  She is having difficulty walking.  She continues to have some discomfort in her hands and her feet.  She states the symptoms are not improving.  She is on oxygen for asthma and COPD.  She has not seen [name] in the last 1 year.  Patient states she will schedule appointment. ? ?Activities of Daily Living:  ?Patient reports morning stiffness for all day. ?Patient Reports nocturnal pain.  ?Difficulty dressing/grooming: Reports ?Difficulty climbing stairs: Reports ?Difficulty getting out of chair: Reports ?Difficulty using hands for taps, buttons, cutlery, and/or writing: Reports ? ?Review of Systems  ?Constitutional:  Positive for fatigue.  ?HENT:  Negative for mouth sores, mouth dryness and nose dryness.   ?Eyes:  Negative for pain, itching and dryness.  ?Respiratory:  Negative for shortness of breath and difficulty breathing.   ?Cardiovascular:  Negative for chest pain and palpitations.  ?Gastrointestinal:  Positive for constipation and nausea. Negative for blood in stool and diarrhea.  ?Endocrine: Negative for increased urination.  ?Genitourinary:  Negative for difficulty urinating.  ?Musculoskeletal:  Positive for joint pain, joint pain, joint swelling, myalgias, morning stiffness and myalgias. Negative for muscle tenderness.  ?Skin:  Negative for color change, rash and redness.  ?Allergic/Immunologic: Negative for  susceptible to infections.  ?Neurological:  Negative for dizziness, numbness, headaches, memory loss and weakness.  ?Hematological:  Positive for bruising/bleeding tendency.  ?Psychiatric/Behavioral:  Negative for confusion.   ? ?PMFS History:  ?Patient Active Problem List  ? Diagnosis Date Noted  ? Former smoker 03/24/2020  ? Breast nodule 03/24/2020  ? GERD (gastroesophageal reflux disease) 09/07/2019  ? Ground glass opacity present on imaging of lung 09/07/2019  ? Interstitial pulmonary disease ([place]) 09/07/2019  ? Emphysema lung ([place]) 07/03/2019  ? Abnormal findings on diagnostic imaging of lung 07/03/2019  ? Myalgia 07/03/2019  ? Hypertensive urgency 05/18/2019  ? Chest pain 05/18/2019  ? Acute respiratory failure with hypoxia ([place]) 05/17/2019  ? PULMONARY SARCOIDOSIS 07/14/2007  ? 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 Essential hypertension 06/27/2007  ? DYSPNEA 06/27/2007  ?  ?Past Medical History:  ?Diagnosis Date  ? Asthma   ? Hypertension   ? Sarcoidosis   ? Sickle cell trait ([place])   ? Stress incontinence   ?  ?Family History  ?Problem Relation Age of Onset  ? Hypertension Mother   ? Congestive Heart Failure Mother   ? Kidney failure Mother   ? Sickle cell trait Mother   ? Hypertension Father   ? Congestive Heart Failure Father   ? Kidney failure Father   ? Glaucoma Father   ? Hypertension Brother   ? Liver disease Brother   ? Sickle cell trait Son   ? Sickle cell trait Daughter   ?  Sickle cell trait Daughter   ? ?Past Surgical History:  ?Procedure Laterality Date  ? BREAST SURGERY Right   ? biopsy  ? TUBAL LIGATION    ? ?Social History  ? ?Social History Narrative  ? Not on file  ? ?Immunization History  ?Administered Date(s) Administered  ? Influenza,inj,Quad PF,6+ Mos 10/13/2019  ? PFIZER(Purple Top)SARS-COV-2 Vaccination 05/21/2020, 06/16/2020  ?  ? ?Objective: ?Vital Signs: BP 126/86 (BP Location: Left Arm, Patient Position: Sitting, Cuff Size: Large)   Pulse 90   Ht 5' 4"  (1.626 m)   Wt  260 lb (117.9 kg)   BMI 44.63 kg/m?   ? ?Physical Exam ?Vitals and nursing note reviewed.  ?Constitutional:   ?   Appearance: She is well-developed.  ?HENT:  ?   Head: Normocephalic and atraumatic.  ?Eyes:  ?   Conjunctiva/sclera: Conjunctivae normal.  ?Cardiovascular:  ?   Rate and Rhythm: Normal rate and regular rhythm.  ?   Heart sounds: Normal heart sounds.  ?Pulmonary:  ?   Effort: Pulmonary effort is normal.  ?   Breath sounds: Normal breath sounds.  ?Abdominal:  ?   General: Bowel sounds are normal.  ?   Palpations: Abdomen is soft.  ?Musculoskeletal:  ?   Cervical back: Normal range of motion.  ?Lymphadenopathy:  ?   Cervical: No cervical adenopathy.  ?Skin: ?   General: Skin is warm and dry.  ?   Capillary Refill: Capillary refill takes less than 2 seconds.  ?Neurological:  ?   Mental Status: She is alert and oriented to person, place, and time.  ?Psychiatric:     ?   Behavior: Behavior normal.  ?  ? ?Musculoskeletal Exam: She had good range of motion of the cervical spine.  She had tenderness over coccygeal region.  Shoulder joints, elbow joints, wrist joints with good range of motion.  She had no synovitis over MCPs PIPs and DIPs.  Knee joints with good range of motion without any warmth swelling or effusion.  There was no tenderness over ankles or MTPs. ? ?CDAI Exam: ?CDAI Score: -- ?Patient Global: --; Provider Global: -- ?Swollen: --; Tender: -- ?Joint Exam 05/04/2021  ? ?No joint exam has been documented for this visit  ? ?There is currently no information documented on the homunculus. Go to the Rheumatology activity and complete the homunculus joint exam. ? ?Investigation: ?No additional findings. ? ?Imaging: ?XR KNEE 3 VIEW RIGHT ? ?Result Date: 05/04/2021 ?Moderate medial compartment narrowing and moderate patellofemoral narrowing was noted.  No chondrocalcinosis was noted. Impression: These findings are consistent with moderate osteoarthritis and moderate chondromalacia patella.  ? ?Recent  Labs: ?Lab Results  ?Component Value Date  ? WBC 9.3 01/26/2021  ? HGB 9.2 (L) 01/26/2021  ? PLT 454 (H) 01/26/2021  ? NA 137 03/25/2020  ? K 3.2 (L) 03/25/2020  ? CL 92 (L) 03/25/2020  ? CO2 31 (H) 03/25/2020  ? GLUCOSE 100 (H) 03/25/2020  ? BUN 9 03/25/2020  ? CREATININE 0.68 03/25/2020  ? BILITOT 0.3 03/25/2020  ? ALKPHOS 98 03/25/2020  ? AST 16 03/25/2020  ? ALT 12 03/25/2020  ? PROT 7.0 03/25/2020  ? ALBUMIN 4.2 03/25/2020  ? CALCIUM 9.0 03/25/2020  ? GFRAA 121 03/25/2020  ? ?May 30, 2020 ENA negative, C3-C4 normal, CK1 44 TSH normal ? ?07/03/19: ANA 1:80NS, ESR 40, CRP 1.3, RF<14, Anti-CCP<16, Ace 12, BNP 10 ? ?Speciality Comments: No specialty comments available. ? ?Procedures:  ?Large Joint Inj: R knee on 05/04/2021 8:53  AM ?Indications: pain ?Details: 27 G 1.5 in needle, medial approach ? ?Arthrogram: No ? ?Medications: 40 mg triamcinolone acetonide 40 MG/ML; 1.5 mL lidocaine 1 % ?Aspirate: 0 mL ?Outcome: tolerated well, no immediate complications ?Procedure, treatment alternatives, risks and benefits explained, specific risks discussed. Consent was given by the patient. Immediately prior to procedure a time out was called to verify the correct patient, procedure, equipment, support staff and site/side marked as required. Patient was prepped and draped in the usual sterile fashion.  ? ? ?Allergies: Patient has no known allergies.  ? ?Assessment / Plan:     ?Visit Diagnoses: Primary osteoarthritis of both hands -she had mild PIP and DIP thickening.  No synovitis was noted.  X-rays obtained during the last visit were consistent with osteoarthritis.  Joint protection was discussed. ? ?Acute pain of right knee -she has been having pain and discomfort in her right knee joint for the last 3 weeks.  She denies any history of injury.  No warmth swelling or effusion was noted.  Plan: XR KNEE 3 VIEW RIGHT.  X-ray showed moderate osteoarthritis and moderate chondromalacia patella.  No radiographic progression was noted  when compared to the x-rays of 2022.  Patient has been experiencing a lot of discomfort and requested a cortisone injection.  After informed consent was obtained and side effects were discussed the right knee joint

## 2021-05-04 ENCOUNTER — Ambulatory Visit (INDEPENDENT_AMBULATORY_CARE_PROVIDER_SITE_OTHER): Payer: Medicaid Other

## 2021-05-04 ENCOUNTER — Ambulatory Visit (INDEPENDENT_AMBULATORY_CARE_PROVIDER_SITE_OTHER): Payer: Medicaid Other | Admitting: Rheumatology

## 2021-05-04 ENCOUNTER — Encounter: Payer: Self-pay | Admitting: Rheumatology

## 2021-05-04 VITALS — BP 126/86 | HR 90 | Ht 64.0 in | Wt 260.0 lb

## 2021-05-04 DIAGNOSIS — M19042 Primary osteoarthritis, left hand: Secondary | ICD-10-CM

## 2021-05-04 DIAGNOSIS — J449 Chronic obstructive pulmonary disease, unspecified: Secondary | ICD-10-CM

## 2021-05-04 DIAGNOSIS — M545 Low back pain, unspecified: Secondary | ICD-10-CM | POA: Diagnosis not present

## 2021-05-04 DIAGNOSIS — M19071 Primary osteoarthritis, right ankle and foot: Secondary | ICD-10-CM

## 2021-05-04 DIAGNOSIS — D869 Sarcoidosis, unspecified: Secondary | ICD-10-CM

## 2021-05-04 DIAGNOSIS — R5383 Other fatigue: Secondary | ICD-10-CM | POA: Diagnosis not present

## 2021-05-04 DIAGNOSIS — M791 Myalgia, unspecified site: Secondary | ICD-10-CM

## 2021-05-04 DIAGNOSIS — G8929 Other chronic pain: Secondary | ICD-10-CM

## 2021-05-04 DIAGNOSIS — I10 Essential (primary) hypertension: Secondary | ICD-10-CM | POA: Diagnosis not present

## 2021-05-04 DIAGNOSIS — R768 Other specified abnormal immunological findings in serum: Secondary | ICD-10-CM

## 2021-05-04 DIAGNOSIS — R7689 Other specified abnormal immunological findings in serum: Secondary | ICD-10-CM

## 2021-05-04 DIAGNOSIS — M19041 Primary osteoarthritis, right hand: Secondary | ICD-10-CM

## 2021-05-04 DIAGNOSIS — M25561 Pain in right knee: Secondary | ICD-10-CM

## 2021-05-04 DIAGNOSIS — J4489 Other specified chronic obstructive pulmonary disease: Secondary | ICD-10-CM

## 2021-05-04 DIAGNOSIS — M170 Bilateral primary osteoarthritis of knee: Secondary | ICD-10-CM

## 2021-05-04 DIAGNOSIS — R0602 Shortness of breath: Secondary | ICD-10-CM | POA: Diagnosis not present

## 2021-05-04 DIAGNOSIS — M533 Sacrococcygeal disorders, not elsewhere classified: Secondary | ICD-10-CM

## 2021-05-04 DIAGNOSIS — M19072 Primary osteoarthritis, left ankle and foot: Secondary | ICD-10-CM

## 2021-05-04 DIAGNOSIS — Z87891 Personal history of nicotine dependence: Secondary | ICD-10-CM

## 2021-05-04 MED ORDER — TRIAMCINOLONE ACETONIDE 40 MG/ML IJ SUSP
40.0000 mg | INTRAMUSCULAR | Status: AC | PRN
  Administered 2021-05-04: 40 mg via INTRA_ARTICULAR

## 2021-05-04 MED ORDER — LIDOCAINE HCL 1 % IJ SOLN
1.5000 mL | INTRAMUSCULAR | Status: AC | PRN
  Administered 2021-05-04: 1.5 mL

## 2021-05-04 NOTE — Patient Instructions (Addendum)
Please schedule an appointment with the lung specialist Dr. Larey Days. [phone] ? ?Knee Exercises ?Ask your health care provider which exercises are safe for you. Do exercises exactly as told by your health care provider and adjust them as directed. It is normal to feel mild stretching, pulling, tightness, or discomfort as you do these exercises. Stop right away if you feel sudden pain or your pain gets worse. Do not begin these exercises until told by your health care provider. ?Stretching and range-of-motion exercises ?These exercises warm up your muscles and joints and improve the movement and flexibility of your knee. These exercises also help to relieve pain and swelling. ?Knee extension, prone ? ?Lie on your abdomen (prone position) on a bed. ?Place your left / right knee just beyond the edge of the surface so your knee is not on the bed. You can put a towel under your left / right thigh just above your kneecap for comfort. ?Relax your leg muscles and allow gravity to straighten your knee (extension). You should feel a stretch behind your left / right knee. ?Hold this position for __________ seconds. ?Scoot up so your knee is supported between repetitions. ?Repeat __________ times. Complete this exercise __________ times a day. ?Knee flexion, active ? ?Lie on your back with both legs straight. If this causes back discomfort, bend your left / right knee so your foot is flat on the floor. ?Slowly slide your left / right heel back toward your buttocks. Stop when you feel a gentle stretch in the front of your knee or thigh (flexion). ?Hold this position for __________ seconds. ?Slowly slide your left / right heel back to the starting position. ?Repeat __________ times. Complete this exercise __________ times a day. ?Quadriceps stretch, prone ? ?Lie on your abdomen on a firm surface, such as a bed or padded floor. ?Bend your left / right knee and hold your ankle. If you cannot reach your ankle or pant  leg, loop a belt around your foot and grab the belt instead. ?Gently pull your heel toward your buttocks. Your knee should not slide out to the side. You should feel a stretch in the front of your thigh and knee (quadriceps). ?Hold this position for __________ seconds. ?Repeat __________ times. Complete this exercise __________ times a day. ?Hamstring, supine ? ?Lie on your back (supine position). ?Loop a belt or towel over the ball of your left / right foot. The ball of your foot is on the walking surface, right under your toes. ?Straighten your left / right knee and slowly pull on the belt to raise your leg until you feel a gentle stretch behind your knee (hamstring). ?Do not let your knee bend while you do this. ?Keep your other leg flat on the floor. ?Hold this position for __________ seconds. ?Repeat __________ times. Complete this exercise __________ times a day. ?Strengthening exercises ?These exercises build strength and endurance in your knee. Endurance is the ability to use your muscles for a long time, even after they get tired. ?Quadriceps, isometric ?This exercise strengthens the muscles in front of your thigh (quadriceps) without moving your knee joint (isometric). ?Lie on your back with your left / right leg extended and your other knee bent. Put a rolled towel or small pillow under your knee if told by your health care provider. ?Slowly tense the muscles in the front of your left / right thigh. You should see your kneecap slide up toward your hip or see increased dimpling just above the  knee. This motion will push the back of the knee toward the floor. ?For __________ seconds, hold the muscle as tight as you can without increasing your pain. ?Relax the muscles slowly and completely. ?Repeat __________ times. Complete this exercise __________ times a day. ?Straight leg raises ?This exercise strengthens the muscles in front of your thigh (quadriceps) and the muscles that move your hips (hip  flexors). ?Lie on your back with your left / right leg extended and your other knee bent. ?Tense the muscles in the front of your left / right thigh. You should see your kneecap slide up or see increased dimpling just above the knee. Your thigh may even shake a bit. ?Keep these muscles tight as you raise your leg 4-6 inches (10-15 cm) off the floor. Do not let your knee bend. ?Hold this position for __________ seconds. ?Keep these muscles tense as you lower your leg. ?Relax your muscles slowly and completely after each repetition. ?Repeat __________ times. Complete this exercise __________ times a day. ?Hamstring, isometric ? ?Lie on your back on a firm surface. ?Bend your left / right knee about __________ degrees. ?Dig your left / right heel into the surface as if you are trying to pull it toward your buttocks. Tighten the muscles in the back of your thighs (hamstring) to "dig" as hard as you can without increasing any pain. ?Hold this position for __________ seconds. ?Release the tension gradually and allow your muscles to relax completely for __________ seconds after each repetition. ?Repeat __________ times. Complete this exercise __________ times a day. ?Hamstring curls ?If told by your health care provider, do this exercise while wearing ankle weights. Begin with __________lb / kg weights. Then increase the weight by 1 lb (0.5 kg) increments. Do not wear ankle weights that are more than __________lb / kg. ?Lie on your abdomen with your legs straight. ?Bend your left / right knee as far as you can without feeling pain. Keep your hips flat against the floor. ?Hold this position for __________ seconds. ?Slowly lower your leg to the starting position. ?Repeat __________ times. Complete this exercise __________ times a day. ?Squats ?This exercise strengthens the muscles in front of your thigh and knee (quadriceps). ?Stand in front of a table, with your feet and knees pointing straight ahead. You may rest your  hands on the table for balance but not for support. ?Slowly bend your knees and lower your hips like you are going to sit in a chair. ?Keep your weight over your heels, not over your toes. ?Keep your lower legs upright so they are parallel with the table legs. ?Do not let your hips go lower than your knees. ?Do not bend lower than told by your health care provider. ?If your knee pain increases, do not bend as low. ?Hold the squat position for __________ seconds. ?Slowly push with your legs to return to standing. Do not use your hands to pull yourself to standing. ?Repeat __________ times. Complete this exercise __________ times a day. ?Wall slides ?This exercise strengthens the muscles in front of your thigh and knee (quadriceps). ?Lean your back against a smooth wall or door, and walk your feet out 18-24 inches (46-61 cm) from it. ?Place your feet hip-width apart. ?Slowly slide down the wall or door until your knees bend __________ degrees. Keep your knees over your heels, not over your toes. Keep your knees in line with your hips. ?Hold this position for __________ seconds. ?Repeat __________ times. Complete this exercise __________ times  a day. ?Straight leg raises, side-lying ?This exercise strengthens the muscles that rotate the leg at the hip and move it away from your body (hip abductors). ?Lie on your side with your left / right leg in the top position. Lie so your head, shoulder, knee, and hip line up. You may bend your bottom knee to help you keep your balance. ?Roll your hips slightly forward so your hips are stacked directly over each other and your left / right knee is facing forward. ?Leading with your heel, lift your top leg 4-6 inches (10-15 cm). You should feel the muscles in your outer hip lifting. ?Do not let your foot drift forward. ?Do not let your knee roll toward the ceiling. ?Hold this position for __________ seconds. ?Slowly return your leg to the starting position. ?Let your muscles relax  completely after each repetition. ?Repeat __________ times. Complete this exercise __________ times a day. ?Straight leg raises, prone ?This exercise stretches the muscles that move your hips away from the front of th

## 2021-06-03 DIAGNOSIS — D869 Sarcoidosis, unspecified: Secondary | ICD-10-CM | POA: Diagnosis not present

## 2021-06-03 DIAGNOSIS — R32 Unspecified urinary incontinence: Secondary | ICD-10-CM | POA: Diagnosis not present

## 2021-06-03 DIAGNOSIS — J189 Pneumonia, unspecified organism: Secondary | ICD-10-CM | POA: Diagnosis not present

## 2021-06-03 DIAGNOSIS — J45909 Unspecified asthma, uncomplicated: Secondary | ICD-10-CM | POA: Diagnosis not present

## 2021-06-09 ENCOUNTER — Encounter (HOSPITAL_COMMUNITY): Payer: Self-pay

## 2021-06-09 ENCOUNTER — Ambulatory Visit (HOSPITAL_COMMUNITY)
Admission: EM | Admit: 2021-06-09 | Discharge: 2021-06-09 | Discharge status: Against Medical Advice | Payer: Medicaid Other | Attending: Internal Medicine | Admitting: Internal Medicine

## 2021-06-09 DIAGNOSIS — R0602 Shortness of breath: Secondary | ICD-10-CM

## 2021-06-09 MED ORDER — ALBUTEROL SULFATE (2.5 MG/3ML) 0.083% IN NEBU: 2.5000 mg | INHALATION_SOLUTION | Freq: Once | RESPIRATORY_TRACT | Status: DC

## 2021-06-09 NOTE — ED Triage Notes (Signed)
C/o sob and abdominal pain for 2-3 days. Pt reports she is not feeling well.  ?

## 2021-06-09 NOTE — Discharge Instructions (Addendum)
Patient encouraged to return to urgent care or emergency department for evaluation today after she handles situation at home. ?

## 2021-06-09 NOTE — ED Provider Notes (Signed)
[facility]    CSN: [id number] Arrival date & time: 06/09/21  1358      History   Chief Complaint Chief Complaint  Patient presents with   Abdominal Pain   Shortness of Breath    HPI Teresa Mendez is a 46 y.o. female.   Patient presents to urgent care for evaluation of lightheadedness, chest soreness to the right side, and shortness of breath, and abdominal pain. Her stomach feels like there's "air" in her abdomen and "soreness to her inside". She also states she feels sightly more confused and weak as well with "a little headache". Symptoms have been going on for 2 days.   She is on 3L of oxygen at her baseline and has had to increase her oxygen to 4L with small improvement. Her breathing is worse when she lays down flat. She has been using her albuterol inhaler and nebulizer at home regularly. Denies wheeze, but her chest feels "tight". She has been using her albuterol 2-3 times per day for the last 2 days. Denies cough. She reports nausea with waking that is resolved by zofran ODT tablets. She vomited 2 days ago and her emesis was clear and "bubbly". Yesterday, she felt like vomiting, but didn't. Denies sick contacts. She was vaccinated for COVID-19 last year, but has not received the updated bivalent vaccine for COVID-19 yet.  She states she spent time on the porch over the last couple of days and wonders if this could be aggravating her breathing. She reports cloudy vision and states that she's a little "fuzzy". Word recall decreased over the last 2 days and she reports mild confusion that has also been noticed by her family. She states "other than all that, I'm okay, I'm just a little tired". She has had vaginal bleeding for 1 month and has been seen by OB/GYN and diagnosed with uterine fibroid. She denies change in amount of vaginal bleeding over the last week. Denies vaginal discharge and urinary frequency, urgency, and dysuria.  Denies any other aggravating or relieving  factors.   Abdominal Pain Associated symptoms: shortness of breath   Shortness of Breath Associated symptoms: abdominal pain    Past Medical History:  Diagnosis Date   Asthma    Hypertension    Sarcoidosis    Sickle cell trait (HCC)    Stress incontinence     Patient Active Problem List   Diagnosis Date Noted   Former smoker 03/24/2020   Breast nodule 03/24/2020   GERD (gastroesophageal reflux disease) 09/07/2019   Ground glass opacity present on imaging of lung 09/07/2019   Interstitial pulmonary disease (HCC) 09/07/2019   Emphysema lung (HCC) 07/03/2019   Abnormal findings on diagnostic imaging of lung 07/03/2019   Myalgia 07/03/2019   Hypertensive urgency 05/18/2019   Chest pain 05/18/2019   Acute respiratory failure with hypoxia (HCC) 05/17/2019   PULMONARY SARCOIDOSIS 07/14/2007   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 Authorizing Provider  albuterol (PROVENTIL) (2.5 MG/3ML) 0.083% nebulizer solution TAKE 3 MLS (2.5 MG TOTAL) BY NEBULIZATION EVERY 6 (SIX) HOURS AS NEEDED FOR WHEEZING OR SHORTNESS OF BREATH. 07/08/20 07/08/21  Hunsucker, Lesia Sago, MD  albuterol (VENTOLIN HFA) 108 (90 Base) MCG/ACT inhaler Inhale 1-2 puffs into the lungs every 6 (six) hours as needed for wheezing or shortness of breath. 03/29/21   Hoy Register, MD  amLODipine (NORVASC) 10 MG tablet TAKE 1 TABLET (10 MG TOTAL) BY MOUTH DAILY. 03/29/21 03/29/22  Hoy Register, MD  aspirin EC 81 MG tablet Take 81 mg by mouth daily.    [provider]  atorvastatin (LIPITOR) 80 MG tablet TAKE 1 TABLET (80 MG TOTAL) BY MOUTH DAILY. 03/29/21 03/29/22  Hoy Register, MD  Budeson-Glycopyrrol-Formoterol (BREZTRI AEROSPHERE) 160-9-4.8 MCG/ACT AERO Inhale 2 puffs into the lungs in the morning and at bedtime. Patient not taking: Reported on 05/04/2021 06/20/20   Hunsucker, Lesia Sago, MD  Budeson-Glycopyrrol-Formoterol (BREZTRI AEROSPHERE) 160-9-4.8 MCG/ACT AERO Inhale 2 puffs into the lungs in the morning and at bedtime. 06/20/20   Hunsucker, Lesia Sago, MD  carvedilol (COREG) 3.125 MG tablet TAKE 1 TABLET (3.125 MG TOTAL) BY MOUTH 2 (TWO) TIMES DAILY WITH A MEAL. 03/29/21 03/29/22  Hoy Register, MD  cetirizine (ZYRTEC) 10 MG tablet TAKE 1 TABLET (10 MG TOTAL) BY MOUTH DAILY. 03/29/21 03/29/22  Hoy Register, MD  DULoxetine (CYMBALTA) 60 MG capsule TAKE 1 CAPSULE (60 MG TOTAL) BY MOUTH DAILY. 03/29/21 03/29/22  Hoy Register, MD  famotidine (PEPCID) 20 MG tablet Take 1 tablet (20 mg total) by mouth at bedtime. Patient not taking: Reported on 05/04/2021 03/28/21 06/26/21  Claiborne Rigg, NP  ferrous sulfate 325 (65 FE) MG tablet Take 1 tablet (325 mg total) by mouth daily with breakfast. 07/08/20 05/04/21  Claiborne Rigg, NP  fluticasone (FLONASE) 50 MCG/ACT nasal spray Place 2 sprays into both nostrils daily. Patient not taking: Reported on 05/04/2021 08/07/19   Claiborne Rigg, NP  hydrochlorothiazide (HYDRODIURIL) 25 MG tablet TAKE 1 TABLET (25 MG TOTAL) BY MOUTH DAILY. 03/29/21 03/29/22  Hoy Register, MD  lisinopril (ZESTRIL) 40 MG tablet TAKE 1 TABLET (40 MG TOTAL) BY MOUTH DAILY. 08/29/20 08/29/21  Claiborne Rigg, NP  Masks MISC Nebulizer face mask B14.782 02/12/21   Claiborne Rigg, NP  montelukast (SINGULAIR) 10 MG tablet TAKE 1 TABLET (10 MG TOTAL) BY MOUTH AT BEDTIME. 07/08/20 07/08/21  Hunsucker, Lesia Sago, MD  omeprazole (PRILOSEC) 20 MG capsule TAKE 1 CAPSULE (20 MG TOTAL) BY MOUTH DAILY. 11/17/20 11/17/21  Hoy Register, MD  OXYGEN Inhale  into the lungs. 3 liters    [provider]  potassium chloride SA (KLOR-CON M) 20 MEQ tablet TAKE 1 TABLET (20 MEQ TOTAL) BY MOUTH DAILY FOR 14 DAYS. Patient taking differently: as needed. 08/29/20 08/29/21  Claiborne Rigg, NP  triamcinolone ointment (KENALOG) 0.1 % Apply 1 application topically 2 (two) times daily. 01/30/21   Claiborne Rigg, NP    Family History Family History  Problem Relation Age of Onset   Hypertension Mother    Congestive Heart Failure Mother    Kidney failure Mother    Sickle cell trait Mother    Hypertension Father    Congestive Heart Failure Father    Kidney failure Father  Glaucoma Father    Hypertension Brother    Liver disease Brother    Sickle cell trait Son    Sickle cell trait Daughter    Sickle cell trait Daughter     Social History Social History   Tobacco Use   Smoking status: Former    Packs/day: 0.50    Types: Cigarettes    Quit date: 01/02/2019    Years since quitting: 2.4    Passive exposure: Current   Smokeless tobacco: Never  Vaping Use   Vaping Use: Former  Substance Use Topics   Alcohol use: Yes    Comment: Occasionally    Drug use: No     Allergies   Patient has no known allergies.   Review of Systems Review of Systems  Respiratory:  Positive for shortness of breath.   Gastrointestinal:  Positive for abdominal pain.  Per HPI  Physical Exam Triage Vital Signs ED Triage Vitals [06/09/21 1447]  Enc Vitals Group     BP (!) 158/96     Pulse Rate 95     Resp 16     Temp 98.6 F (37 C)     Temp Source Oral     SpO2 100 %     Weight      Height      Head Circumference      Peak Flow      Pain Score      Pain Loc      Pain Edu?      Excl. in GC?    No data found.  Updated Vital Signs BP (!) 158/96 (BP Location: Left Arm)   Pulse 95   Temp 98.6 F (37 C) (Oral)   Resp 16   SpO2 100%   Visual Acuity Right Eye Distance:   Left Eye Distance:   Bilateral Distance:    Right Eye Near:   Left  Eye Near:    Bilateral Near:     Physical Exam Vitals and nursing note reviewed.  Constitutional:      General: She is not in acute distress.    Appearance: Normal appearance. She is well-developed. She is obese. She is not ill-appearing, toxic-appearing or diaphoretic.     Comments: Very pleasant patient sitting in position of comfort on exam table appears visibly short of breath at rest.  HENT:     Head: Normocephalic and atraumatic.     Right Ear: Tympanic membrane, ear canal and external ear normal.     Left Ear: Tympanic membrane, ear canal and external ear normal.     Nose: Nose normal.     Mouth/Throat:     Mouth: Mucous membranes are moist.     Pharynx: No oropharyngeal exudate.  Eyes:     Extraocular Movements: Extraocular movements intact.     Conjunctiva/sclera: Conjunctivae normal.     Pupils: Pupils are equal, round, and reactive to light.  Cardiovascular:     Rate and Rhythm: Normal rate and regular rhythm.     Heart sounds: Normal heart sounds. No murmur heard.   No friction rub. No gallop.  Pulmonary:     Effort: Accessory muscle usage present. No respiratory distress.     Breath sounds: No stridor. Examination of the right-upper field reveals decreased breath sounds. Examination of the left-upper field reveals decreased breath sounds. Examination of the right-middle field reveals decreased breath sounds. Examination of the left-middle field reveals decreased breath sounds. Examination of the right-lower field reveals decreased breath sounds. Examination  of the left-lower field reveals decreased breath sounds. Decreased breath sounds present. No wheezing, rhonchi or rales.  Chest:     Chest wall: No tenderness.  Abdominal:     General: Abdomen is protuberant. Bowel sounds are decreased. There is no distension. There are no signs of injury.     Palpations: Abdomen is soft.     Tenderness: There is abdominal tenderness in the right lower quadrant, suprapubic area and  left lower quadrant. There is no right CVA tenderness or left CVA tenderness. Negative signs include McBurney's sign.  Musculoskeletal:        General: No swelling. Normal range of motion.     Cervical back: Neck supple.     Right lower leg: Tenderness present. No edema.     Left lower leg: No tenderness. No edema.  Lymphadenopathy:     Cervical: No cervical adenopathy.  Skin:    General: Skin is warm and dry.     Capillary Refill: Capillary refill takes less than 2 seconds.     Findings: No rash.  Neurological:     General: No focal deficit present.     Mental Status: She is alert and oriented to person, place, and time.     Cranial Nerves: No cranial nerve deficit.     Motor: No weakness.  Psychiatric:        Mood and Affect: Mood normal.        Behavior: Behavior normal. Behavior is cooperative.        Thought Content: Thought content normal.        Judgment: Judgment normal.     UC Treatments / Results  Labs (all labs ordered are listed, but only abnormal results are displayed) Labs Reviewed - No data to display  EKG   Radiology No results found.  Procedures Procedures (including critical care time)  Medications Ordered in UC Medications - No data to display  Initial Impression / Assessment and Plan / UC Course  I have reviewed the triage vital signs and the nursing notes.  Pertinent labs & imaging results that were available during my care of the patient were reviewed by me and considered in my medical decision making (see chart for details).   Patient is a 46 year old female presenting to urgent care today with 2 day history of worsening shortness of breath, fatigue, abdominal tenderness, and difficulty with memory/confusion. HPI and physical exam completed with patient at facility. Planned to get a chest x-ray to assess for acute thoracic abnormality causing patient's shortness of breath and decreased lung sounds as well as administer an albuterol breathing  treatment. Patient had to leave urgent care abruptly due to a family situation at home. Encouraged patient to stay so that she could be worked up for possible urgent problem. Patient left the urgent care prior to completing visit and stated that she would return later this afternoon/evening. Encouraged patient to return here at urgent care or go to the Emergency Department for evaluation if she is unable to return prior to 8pm. Patient agreed with this plan. Strict ED return precautions given.    Final Clinical Impressions(s) / UC Diagnoses   Final diagnoses:  Shortness of breath     Discharge Instructions      Patient encouraged to return to urgent care or emergency department for evaluation today after she handles situation at home.     ED Prescriptions   None    PDMP not reviewed this encounter.   Reita May  M, FNP 06/09/21 1600

## 2021-06-11 IMAGING — DX DG CHEST 2V
2 series · 2 of 2 positions shown · non-contrast
Comparison: 05/17/2018

CLINICAL DATA: Shortness of breath

EXAM:
CHEST - 2 VIEW

[chest pa]
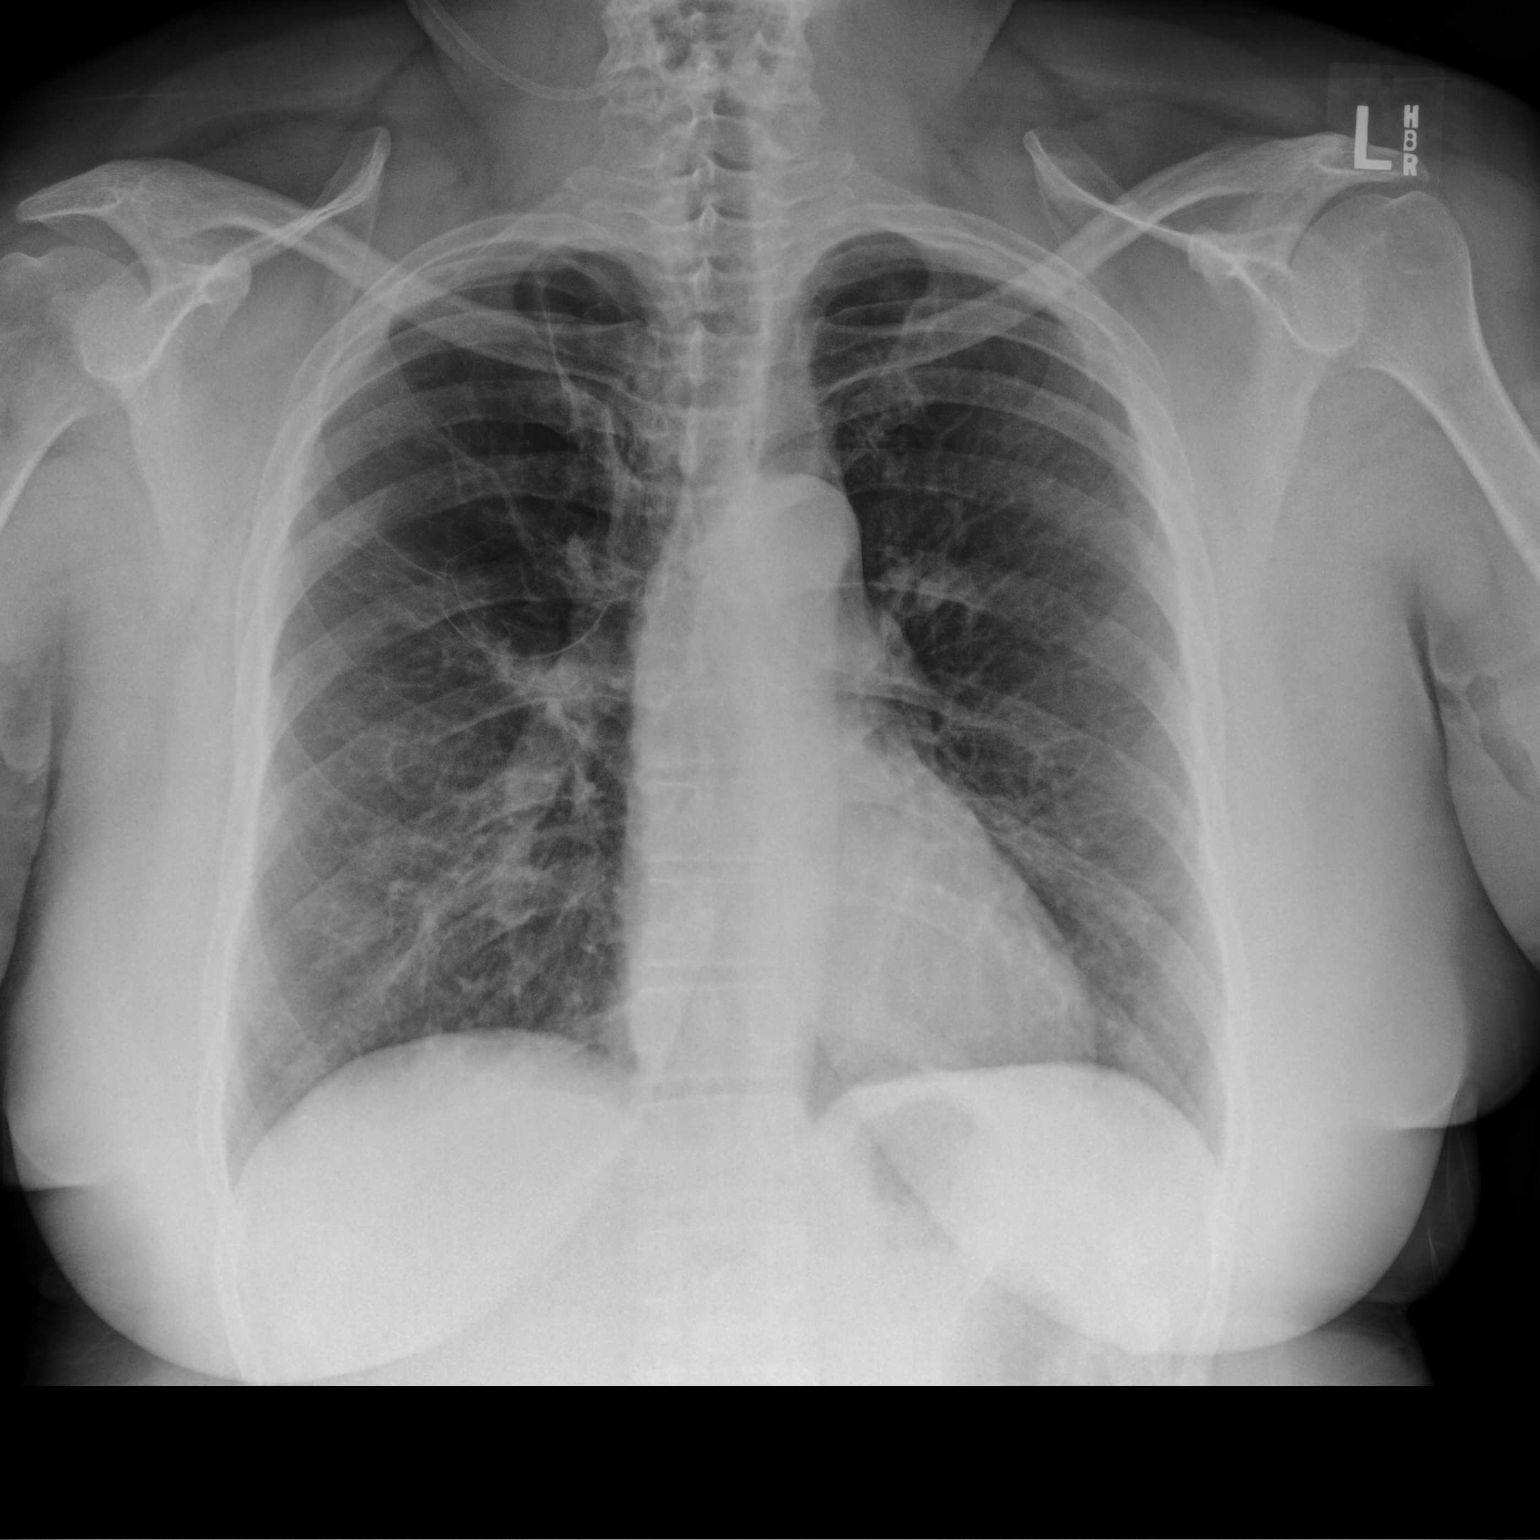

[chest lat]
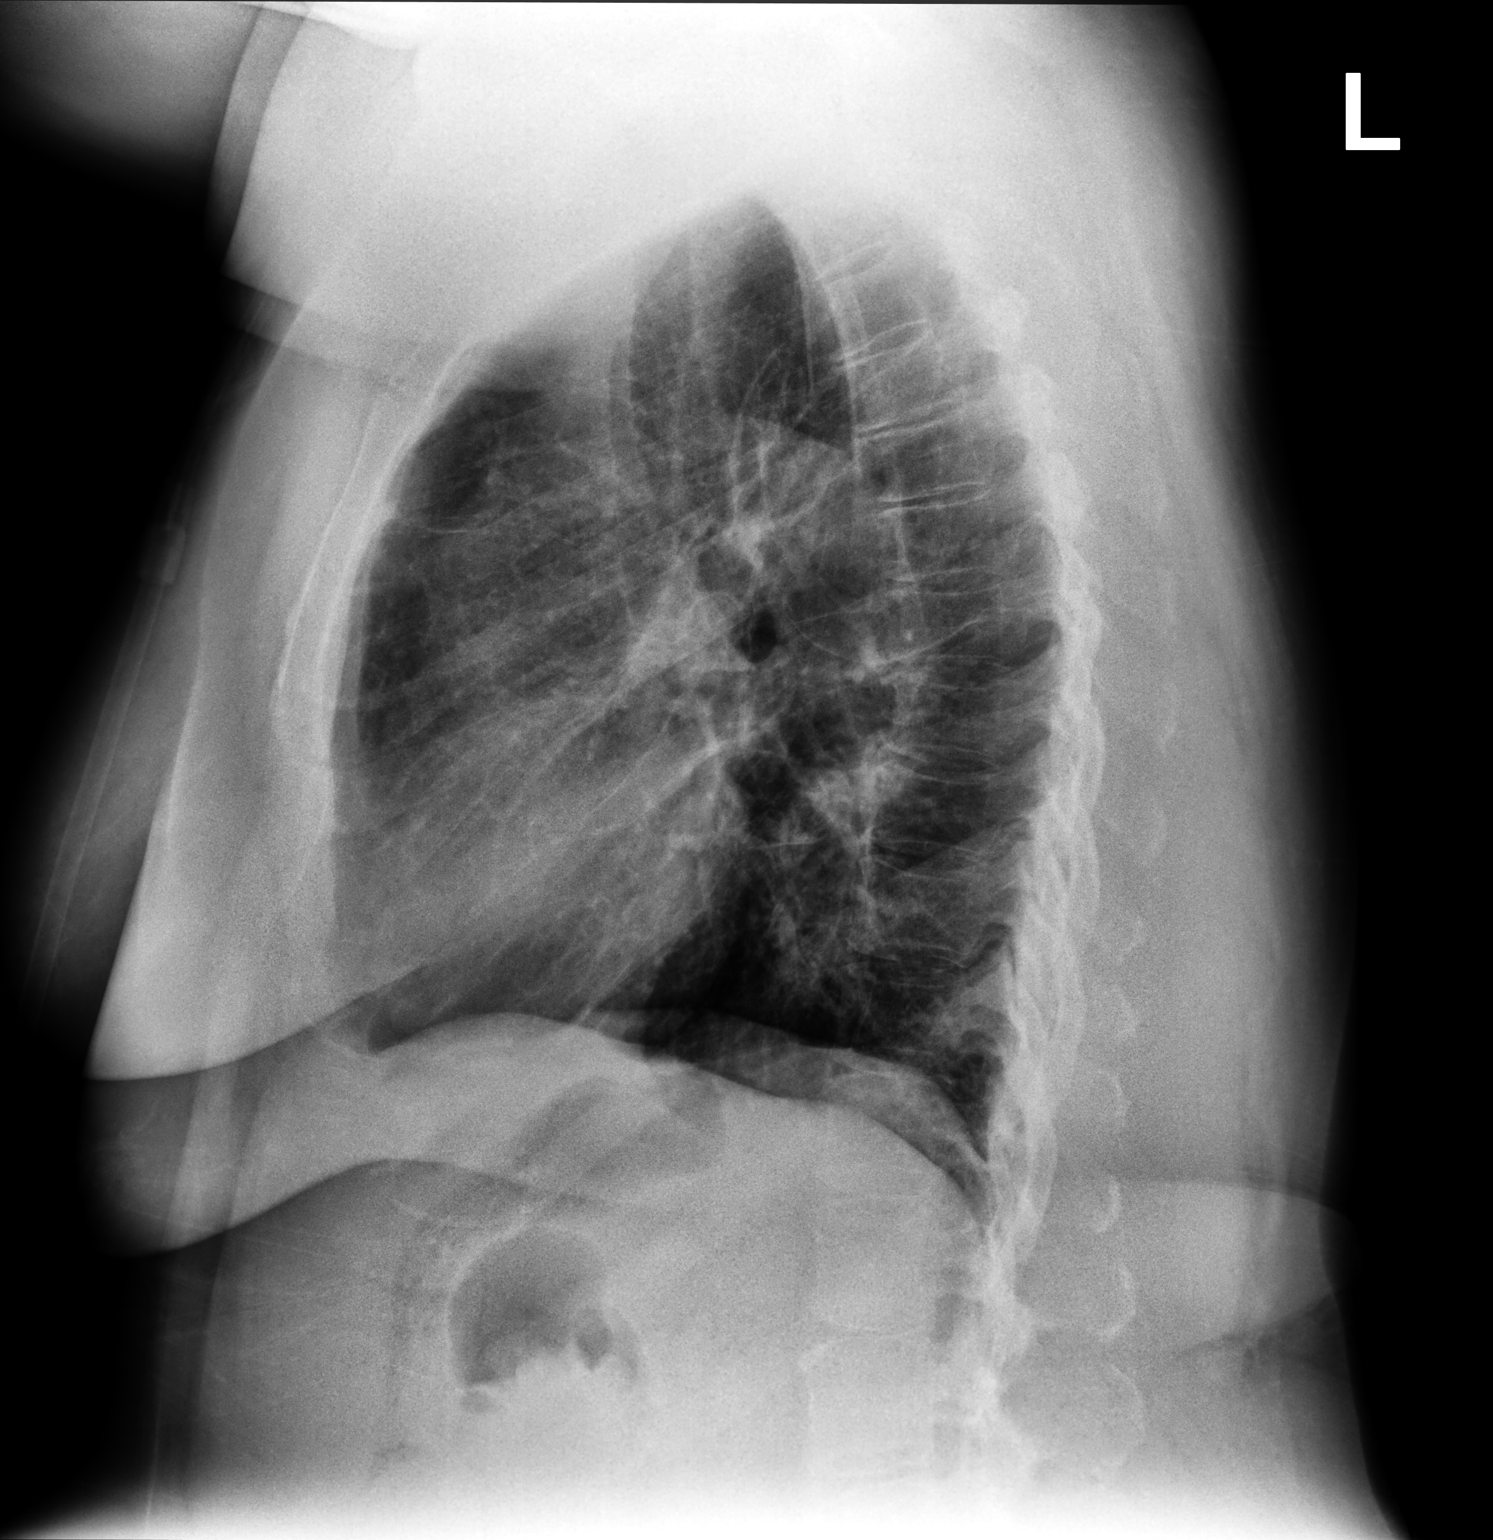

[2 of 2 positions shown; findings below may reference images not displayed]

FINDINGS: Bullous emphysematous changes in the lung apices. Heart is normal
size. No confluent opacities or effusions. No acute bony
abnormality.
IMPRESSION: No acute cardiopulmonary disease.

## 2021-06-13 ENCOUNTER — Other Ambulatory Visit: Payer: Self-pay | Admitting: Nurse Practitioner

## 2021-06-13 ENCOUNTER — Ambulatory Visit: Payer: Medicaid Other | Admitting: Pulmonary Disease

## 2021-06-13 ENCOUNTER — Other Ambulatory Visit: Payer: Self-pay | Admitting: Family Medicine

## 2021-06-13 ENCOUNTER — Other Ambulatory Visit: Payer: Self-pay

## 2021-06-13 DIAGNOSIS — H938X1 Other specified disorders of right ear: Secondary | ICD-10-CM

## 2021-06-13 DIAGNOSIS — E785 Hyperlipidemia, unspecified: Secondary | ICD-10-CM | POA: Diagnosis not present

## 2021-06-14 ENCOUNTER — Other Ambulatory Visit: Payer: Self-pay

## 2021-06-14 DIAGNOSIS — E785 Hyperlipidemia, unspecified: Secondary | ICD-10-CM | POA: Diagnosis not present

## 2021-06-14 MED ORDER — OMEPRAZOLE 20 MG PO CPDR
DELAYED_RELEASE_CAPSULE | Freq: Every day | ORAL | 2 refills | Status: DC
  Filled 2021-06-14 – 2021-06-22 (×2): qty 30, 30d supply, fill #0
  Filled 2021-08-02: qty 30, 30d supply, fill #1

## 2021-06-14 MED ORDER — FLUTICASONE PROPIONATE 50 MCG/ACT NA SUSP
2.0000 | Freq: Every day | NASAL | 2 refills | Status: DC
  Filled 2021-06-14: qty 16, 25d supply, fill #0
  Filled 2021-06-22: qty 16, 30d supply, fill #0

## 2021-06-14 MED ORDER — ALBUTEROL SULFATE HFA 108 (90 BASE) MCG/ACT IN AERS
1.0000 | INHALATION_SPRAY | Freq: Four times a day (QID) | RESPIRATORY_TRACT | 2 refills | Status: DC | PRN
  Filled 2021-06-14 – 2021-06-22 (×2): qty 18, 25d supply, fill #0

## 2021-06-15 DIAGNOSIS — E785 Hyperlipidemia, unspecified: Secondary | ICD-10-CM | POA: Diagnosis not present

## 2021-06-16 DIAGNOSIS — E785 Hyperlipidemia, unspecified: Secondary | ICD-10-CM | POA: Diagnosis not present

## 2021-06-19 DIAGNOSIS — E785 Hyperlipidemia, unspecified: Secondary | ICD-10-CM | POA: Diagnosis not present

## 2021-06-20 ENCOUNTER — Other Ambulatory Visit: Payer: Self-pay

## 2021-06-20 DIAGNOSIS — E785 Hyperlipidemia, unspecified: Secondary | ICD-10-CM | POA: Diagnosis not present

## 2021-06-21 ENCOUNTER — Other Ambulatory Visit: Payer: Self-pay

## 2021-06-21 DIAGNOSIS — E785 Hyperlipidemia, unspecified: Secondary | ICD-10-CM | POA: Diagnosis not present

## 2021-06-22 ENCOUNTER — Other Ambulatory Visit: Payer: Self-pay

## 2021-06-22 DIAGNOSIS — E785 Hyperlipidemia, unspecified: Secondary | ICD-10-CM | POA: Diagnosis not present

## 2021-06-23 DIAGNOSIS — E785 Hyperlipidemia, unspecified: Secondary | ICD-10-CM | POA: Diagnosis not present

## 2021-06-25 ENCOUNTER — Telehealth: Payer: Medicaid Other | Admitting: Family

## 2021-06-25 DIAGNOSIS — J4521 Mild intermittent asthma with (acute) exacerbation: Secondary | ICD-10-CM | POA: Diagnosis not present

## 2021-06-25 MED ORDER — DULOXETINE HCL 60 MG PO CPEP: ORAL_CAPSULE | Freq: Every day | ORAL | 0 refills | Status: DC

## 2021-06-25 MED ORDER — ALBUTEROL SULFATE (2.5 MG/3ML) 0.083% IN NEBU
3.0000 mL | INHALATION_SOLUTION | Freq: Four times a day (QID) | RESPIRATORY_TRACT | 11 refills | Status: DC | PRN
Start: 2021-06-25 — End: 2022-08-29

## 2021-06-25 MED ORDER — AMLODIPINE BESYLATE 10 MG PO TABS: ORAL_TABLET | Freq: Every day | ORAL | 0 refills | Status: DC

## 2021-06-25 MED ORDER — CARVEDILOL 3.125 MG PO TABS: ORAL_TABLET | Freq: Two times a day (BID) | ORAL | 0 refills | Status: DC

## 2021-06-25 MED ORDER — OMEPRAZOLE 20 MG PO CPDR: DELAYED_RELEASE_CAPSULE | Freq: Every day | ORAL | 0 refills | Status: DC

## 2021-06-25 MED ORDER — ALBUTEROL SULFATE HFA 108 (90 BASE) MCG/ACT IN AERS
1.0000 | INHALATION_SPRAY | Freq: Four times a day (QID) | RESPIRATORY_TRACT | 2 refills | Status: DC | PRN
Start: 2021-06-25 — End: 2021-08-02

## 2021-06-25 MED ORDER — MONTELUKAST SODIUM 10 MG PO TABS: ORAL_TABLET | Freq: Every day | ORAL | 0 refills | Status: DC

## 2021-06-25 MED ORDER — CETIRIZINE HCL 10 MG PO TABS: ORAL_TABLET | Freq: Every day | ORAL | 0 refills | Status: DC

## 2021-06-25 MED ORDER — LISINOPRIL 40 MG PO TABS: ORAL_TABLET | Freq: Every day | ORAL | 0 refills | Status: DC

## 2021-06-25 MED ORDER — BREZTRI AEROSPHERE 160-9-4.8 MCG/ACT IN AERO: 2.0000 | INHALATION_SPRAY | Freq: Two times a day (BID) | RESPIRATORY_TRACT | 11 refills | Status: DC

## 2021-06-25 NOTE — Addendum Note (Signed)
Addended by: Evelina Dun A on: 06/25/2021 09:03 AM   Modules accepted: Orders

## 2021-06-25 NOTE — Progress Notes (Signed)
Visit for Asthma  Based on what you have shared with me, it looks like you may have a flare up of your asthma.  Asthma is a chronic (ongoing) lung disease which results in airway obstruction, inflammation and hyper-responsiveness.   Asthma symptoms vary from person to person, with common symptoms including nighttime awakening and decreased ability to participate in normal activities as a result of shortness of breath. It is often triggered by changes in weather, changes in the season, changes in air temperature, or inside (home, school, daycare or work) allergens such as animal dander, mold, mildew, woodstoves or cockroaches.   It can also be triggered by hormonal changes, extreme emotion, physical exertion or an upper respiratory tract illness.     It is important to identify the trigger, and then eliminate or avoid the trigger if possible.   If you have been prescribed medications to be taken on a regular basis, it is important to follow the asthma action plan and to follow guidelines to adjust medication in response to increasing symptoms of decreased peak expiratory flow rate  Treatment: I have prescribed: Albuterol (Proventil HFA; Ventolin HFA) 108 (90 Base) MCG/ACT Inhaler 2 puffs into the lungs every six hours as needed for wheezing or shortness of breath and Albuterol (Proventil) (2.5 mg in 3 mL) 0.083 % Take 52ms by nebulization solution every six hours as needed for wheezing or shortness of breath and zyrtec 10 mg. I have also refilled your Breztri inhaler.   HOME CARE Only take medications as instructed by your medical team. Consider wearing a mask or scarf to improve breathing air temperature have been shown to decrease irritation and decrease exacerbations Get rest. Taking a steamy shower or using a humidifier may help nasal congestion sand ease sore throat pain. You can place  a towel over your head and breathe in the steam from hot water coming from a faucet. Using a saline nasal spray works much the same way.  Cough drops, hare candies and sore throat lozenges may ease your cough.  Avoid close contacts especially the very you and the elderly Cover your mouth if you cough or sneeze Always remember to wash your hands.    GET HELP RIGHT AWAY IF: You develop worsening symptoms; breathlessness at rest, drowsy, confused or agitated, unable to speak in full sentences You have coughing fits You develop a severe headache or visual changes You develop shortness of breath, difficulty breathing or start having chest pain Your symptoms persist after you have completed your treatment plan If your symptoms do not improve within 10 days  MAKE SURE YOU Understand these instructions. Will watch your condition. Will get help right away if you are not doing well or get worse.   Your e-visit answers were reviewed by a board certified advanced clinical practitioner to complete your personal care plan, Depending upon the condition, your plan could have included both over the counter or prescription medications.   Please review your pharmacy choice. Your safety is important to [place] If you have drug allergies check your prescription carefully.  You can use MyChart to ask questions about today's visit, request a non-urgent  call back, or ask for a work or school excuse for 24 hours related to this e-Visit. If it has been greater than 24 hours you will need to follow up with your provider, or enter a new e-Visit to address those concerns.   You will get an e-mail in the next two days asking about your experience. I  hope that your e-visit has been valuable and will speed your recovery. Thank you for using e-visits.  Approximately 5 minutes was spent documenting and reviewing patient's chart.

## 2021-06-26 DIAGNOSIS — E785 Hyperlipidemia, unspecified: Secondary | ICD-10-CM | POA: Diagnosis not present

## 2021-06-27 DIAGNOSIS — E785 Hyperlipidemia, unspecified: Secondary | ICD-10-CM | POA: Diagnosis not present

## 2021-06-28 ENCOUNTER — Other Ambulatory Visit: Payer: Self-pay

## 2021-06-28 DIAGNOSIS — E785 Hyperlipidemia, unspecified: Secondary | ICD-10-CM | POA: Diagnosis not present

## 2021-06-29 DIAGNOSIS — D869 Sarcoidosis, unspecified: Secondary | ICD-10-CM | POA: Diagnosis not present

## 2021-06-29 DIAGNOSIS — E785 Hyperlipidemia, unspecified: Secondary | ICD-10-CM | POA: Diagnosis not present

## 2021-06-29 DIAGNOSIS — J439 Emphysema, unspecified: Secondary | ICD-10-CM | POA: Diagnosis not present

## 2021-06-29 DIAGNOSIS — J849 Interstitial pulmonary disease, unspecified: Secondary | ICD-10-CM | POA: Diagnosis not present

## 2021-06-30 DIAGNOSIS — E785 Hyperlipidemia, unspecified: Secondary | ICD-10-CM | POA: Diagnosis not present

## 2021-07-03 DIAGNOSIS — E785 Hyperlipidemia, unspecified: Secondary | ICD-10-CM | POA: Diagnosis not present

## 2021-07-04 DIAGNOSIS — E785 Hyperlipidemia, unspecified: Secondary | ICD-10-CM | POA: Diagnosis not present

## 2021-07-04 DIAGNOSIS — R32 Unspecified urinary incontinence: Secondary | ICD-10-CM | POA: Diagnosis not present

## 2021-07-05 DIAGNOSIS — E785 Hyperlipidemia, unspecified: Secondary | ICD-10-CM | POA: Diagnosis not present

## 2021-07-06 ENCOUNTER — Encounter (HOSPITAL_COMMUNITY): Payer: Self-pay

## 2021-07-06 ENCOUNTER — Ambulatory Visit (HOSPITAL_COMMUNITY)
Admission: EM | Admit: 2021-07-06 | Discharge: 2021-07-06 | Disposition: A | Discharge status: Home / Self Care | Payer: Medicaid Other | Attending: Emergency Medicine | Admitting: Emergency Medicine

## 2021-07-06 ENCOUNTER — Ambulatory Visit (INDEPENDENT_AMBULATORY_CARE_PROVIDER_SITE_OTHER): Payer: Medicaid Other

## 2021-07-06 DIAGNOSIS — H109 Unspecified conjunctivitis: Secondary | ICD-10-CM | POA: Diagnosis not present

## 2021-07-06 DIAGNOSIS — R0602 Shortness of breath: Secondary | ICD-10-CM

## 2021-07-06 DIAGNOSIS — N939 Abnormal uterine and vaginal bleeding, unspecified: Secondary | ICD-10-CM

## 2021-07-06 DIAGNOSIS — E785 Hyperlipidemia, unspecified: Secondary | ICD-10-CM | POA: Diagnosis not present

## 2021-07-06 DIAGNOSIS — J439 Emphysema, unspecified: Secondary | ICD-10-CM | POA: Diagnosis not present

## 2021-07-06 LAB — POC URINE PREG, ED: Preg Test, Ur: NEGATIVE

## 2021-07-06 MED ORDER — OLOPATADINE HCL 0.1 % OP SOLN
1.0000 [drp] | Freq: Two times a day (BID) | OPHTHALMIC | 12 refills | Status: DC
  Filled 2021-07-06: qty 5, 20d supply, fill #0
  Filled 2021-08-02: qty 5, 50d supply, fill #0

## 2021-07-06 NOTE — Discharge Instructions (Addendum)
Please call your gynecologist as soon as possible to schedule an appointment.  I recommend following up with them for further evaluation.  You can use the eyedrops in both eyes for redness and irritation.  Use albuterol treatments at home as needed.  Please go to the emergency department if you feel your symptoms worsen or your shortness of breath increases.

## 2021-07-06 NOTE — ED Provider Notes (Signed)
[place]    CSN: [id number] Arrival date & time: 07/06/21  1733    History   Chief Complaint Shortness of breath Vaginal bleeding Conjunctivitis  HPI Teresa Mendez is a 46 y.o. female.  Woke up this morning with left eye redness and irritation.  No drainage or pain.  Vaginal bleeding for the past 3 weeks. History of fibroids.  No abdominal pain. Saturates 2-3 briefs a day, no large clots.  No lightheadedness, no dizziness, no headache, not on blood thinner, does take aspirin. Takes iron supplement. She does have a gynecologist but was not able to get an appointment with them.  Shortness of breath at baseline.  She does use 3 L of oxygen at home.  Feels her shortness of breath is at baseline today.  Denies chest pain, cough. Hx of COPD and asthma, has not needed to use her breathing treatments in a few days.  Denies fever/chills, vomiting/diarrhea, rash, weakness, numbness/tingling. BM's normal.  Past Medical History:  Diagnosis Date   Asthma    Hypertension    Sarcoidosis    Sickle cell trait ([place])    Stress incontinence     Patient Active Problem List   Diagnosis Date Noted   Former smoker 03/24/2020   Breast nodule 03/24/2020   GERD (gastroesophageal reflux disease) 09/07/2019   Ground glass opacity present on imaging of lung 09/07/2019   Interstitial pulmonary disease ([place]) 09/07/2019   Emphysema lung ([place]) 07/03/2019   Abnormal findings on diagnostic imaging of lung 07/03/2019   Myalgia 07/03/2019   Hypertensive urgency 05/18/2019   Chest pain 05/18/2019   Acute respiratory failure with hypoxia ([place]) 05/17/2019   PULMONARY SARCOIDOSIS 07/14/2007   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 Authorizing Provider  olopatadine ([facility] OLOPATADINE HCL) 0.1 % ophthalmic solution Place 1 drop into both eyes 2 (two) times daily. 07/06/21  Yes Wynne Rozak, Wells Guiles, PA-C  albuterol (PROVENTIL) (2.5 MG/3ML) 0.083% nebulizer solution TAKE 3 MLS (2.5 MG TOTAL) BY NEBULIZATION EVERY 6 (SIX) HOURS AS NEEDED FOR WHEEZING OR SHORTNESS OF BREATH. 06/25/21 06/25/22  Evelina Dun A, FNP  albuterol (VENTOLIN HFA) 108 (90 Base) MCG/ACT inhaler Inhale 1-2 puffs into the lungs every 6 (six) hours as needed for wheezing or shortness of breath. 06/25/21   Evelina Dun A, FNP  amLODipine (NORVASC) 10 MG tablet TAKE 1 TABLET (10 MG TOTAL) BY MOUTH DAILY. 06/25/21 06/25/22  Evelina Dun A, FNP  aspirin EC 81 MG tablet Take 81 mg by mouth daily.    [provider]  atorvastatin (LIPITOR) 80 MG tablet TAKE 1 TABLET (80 MG TOTAL) BY MOUTH DAILY. 03/29/21 03/29/22  [name], MD  Budeson-Glycopyrrol-Formoterol (BREZTRI AEROSPHERE) 160-9-4.8 MCG/ACT AERO Inhale 2 puffs into the lungs in  the morning and at bedtime. Patient not taking: Reported on 05/04/2021 06/20/20   Hunsucker, Bonna Gains, MD  Budeson-Glycopyrrol-Formoterol (BREZTRI AEROSPHERE) 160-9-4.8 MCG/ACT AERO Inhale 2 puffs into the lungs in the morning and at bedtime. 06/25/21   Evelina Dun A, FNP  carvedilol (COREG) 3.125 MG tablet TAKE 1 TABLET (3.125 MG TOTAL) BY MOUTH 2 (TWO) TIMES DAILY WITH A MEAL. 06/25/21 06/25/22  Sharion Balloon, FNP  cetirizine (ZYRTEC) 10 MG tablet TAKE 1 TABLET (10 MG TOTAL) BY MOUTH DAILY. 06/25/21 06/25/22  Evelina Dun A, FNP  DULoxetine (CYMBALTA) 60 MG capsule TAKE 1 CAPSULE (60 MG TOTAL) BY MOUTH DAILY. 06/25/21 06/25/22  Sharion Balloon, FNP  famotidine (PEPCID) 20 MG tablet Take 1 tablet (20 mg total) by mouth at bedtime. Patient not  taking: Reported on 05/04/2021 03/28/21 06/26/21  [name], NP  ferrous sulfate 325 (65 FE) MG tablet Take 1 tablet (325 mg total) by mouth daily with breakfast. 07/08/20 05/04/21  [name], NP  fluticasone (FLONASE) 50 MCG/ACT nasal spray Place 2 sprays into both nostrils daily. 06/14/21   [name], MD  hydrochlorothiazide (HYDRODIURIL) 25 MG tablet TAKE 1 TABLET (25 MG TOTAL) BY MOUTH DAILY. 03/29/21 03/29/22  [name], MD  lisinopril (ZESTRIL) 40 MG tablet TAKE 1 TABLET (40 MG TOTAL) BY MOUTH DAILY. 06/25/21 06/25/22  Sharion Balloon, FNP  Masks MISC Nebulizer face mask X51.700 02/12/21   [name], NP  montelukast (SINGULAIR) 10 MG tablet TAKE 1 TABLET (10 MG TOTAL) BY MOUTH AT BEDTIME. 06/25/21 06/25/22  Hawks, Alyse Low A, FNP  omeprazole (PRILOSEC) 20 MG capsule TAKE 1 CAPSULE (20 MG TOTAL) BY MOUTH DAILY. 06/14/21 06/14/22  [name], MD  omeprazole (PRILOSEC) 20 MG capsule TAKE 1 CAPSULE (20 MG TOTAL) BY MOUTH DAILY. 06/25/21 06/25/22  Sharion Balloon, FNP  OXYGEN Inhale into the lungs. 3 liters    [provider]  potassium chloride SA (KLOR-CON M) 20 MEQ tablet TAKE 1 TABLET (20 MEQ TOTAL) BY MOUTH DAILY FOR 14 DAYS. Patient taking differently: as needed. 08/29/20 08/29/21  [name], NP  triamcinolone ointment (KENALOG) 0.1 % Apply 1 application topically 2 (two) times daily. 01/30/21   [name], NP    Family History Family History  Problem Relation Age of Onset   Hypertension Mother    Congestive Heart Failure Mother    Kidney failure Mother    Sickle cell trait Mother    Hypertension Father    Congestive Heart Failure Father    Kidney failure Father    Glaucoma Father    Hypertension Brother    Liver disease Brother    Sickle cell trait Son    Sickle cell trait Daughter    Sickle cell trait Daughter     Social History Social History   Tobacco Use   Smoking status: Former    Packs/day: 0.50    Types: Cigarettes    Quit  date: 01/02/2019    Years since quitting: 2.5    Passive exposure: Current   Smokeless tobacco: Never  Vaping Use   Vaping Use: Former  Substance Use Topics   Alcohol use: Yes    Comment: Occasionally    Drug use: No     Allergies   Patient has no known allergies.   Review of Systems Review of Systems Per HPI  Physical Exam Triage Vital Signs ED Triage Vitals  Enc Vitals Group     BP 07/06/21 1748 123/82  Pulse Rate 07/06/21 1748 (!) 111     Resp 07/06/21 1748 18     Temp 07/06/21 1748 98.2 F (36.8 C)     Temp Source 07/06/21 1748 Oral     SpO2 07/06/21 1748 99 %     Weight --      Height --      Head Circumference --      Peak Flow --      Pain Score 07/06/21 1750 10     Pain Loc --      Pain Edu? --      Excl. in [place]? --    No data found.  Updated Vital Signs BP 123/82 (BP Location: Left Arm)   Pulse (!) 111   Temp 98.2 F (36.8 C) (Oral)   Resp 18   LMP 06/16/2021 (Approximate)   SpO2 99%   Pulse 92 at time of discharge  Physical Exam Vitals and nursing note reviewed.  Constitutional:      General: She is not in acute distress.    Appearance: Normal appearance.     Comments: Speaking in full sentences, appears comfortable  HENT:     Mouth/Throat:     Mouth: Mucous membranes are moist.     Pharynx: Oropharynx is clear.  Eyes:     General: Lids are normal.        Left eye: No foreign body or discharge.     Extraocular Movements: Extraocular movements intact.     Conjunctiva/sclera:     Left eye: Left conjunctiva is injected.     Pupils: Pupils are equal, round, and reactive to light.  Cardiovascular:     Rate and Rhythm: Normal rate and regular rhythm.     Heart sounds: Normal heart sounds.  Pulmonary:     Effort: Pulmonary effort is normal. No accessory muscle usage or respiratory distress.     Breath sounds: Decreased air movement present. Decreased breath sounds present.     Comments: On 3L nasal canula Abdominal:     Tenderness:  There is no abdominal tenderness.  Musculoskeletal:     Right lower leg: No edema.     Left lower leg: No edema.  Neurological:     Mental Status: She is alert and oriented to person, place, and time.    UC Treatments / Results  Labs (all labs ordered are listed, but only abnormal results are displayed) Labs Reviewed  POC URINE PREG, ED   EKG  Radiology DG Chest 2 View  Result Date: 07/06/2021 CLINICAL DATA:  shortness of breath, dec lung sounds EXAM: CHEST - 2 VIEW COMPARISON:  Radiograph 03/23/2020, chest CT 05/17/2019 FINDINGS: Unchanged cardiomediastinal silhouette. Emphysema with right upper lung bulla, similar to prior. There is no new airspace disease. No pleural effusion. No pneumothorax. There is no acute osseous abnormality. 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 No new airspace disease. Electronically Signed   By: Maurine Simmering M.D.   On: 07/06/2021 18:54    Procedures Procedures   Medications Ordered in UC Medications - No data to display  Initial Impression / Assessment and Plan / UC Course  I have reviewed the triage vital signs and the nursing notes.  Pertinent labs & imaging results that were available during my care of the patient were reviewed by me and considered in my medical decision making (see chart for details).  Urine pregnancy negative. Chest x ray obtained and shows bullous disease right upper lung. Compared to imaging  from February 2022 which showed bullous disease and scarring of right upper lobe. Stable xray. She will continue her home medications and use breathing treatments as needed. We discussed symptoms that would warrant immediate trip to the emergency department. She will go to ED if symptoms worsen. I do not feel she is having exacerbation of underlying asthma/COPD at this time. No increased oxygen demands in clinic today. Appears stable for discharge.  At this time I recommend patient contact her gynecologist and see  if she can schedule an acute visit for her continued vaginal bleeding. Discussed that she may need ultrasound which we do not have at this facility. Patient voices understanding and will call them first thing tomorrow morning.  I recommend eye drops to both eyes twice a day for redness and irritation. Sent in olopatadine.   Return precautions discussed. Patient agrees to plan and is discharged in stable condition.  Final Clinical Impressions(s) / UC Diagnoses   Final diagnoses:  Shortness of breath  Vaginal bleeding  Conjunctivitis of left eye, unspecified conjunctivitis type     Discharge Instructions      Please call your gynecologist as soon as possible to schedule an appointment.  I recommend following up with them for further evaluation.  You can use the eyedrops in both eyes for redness and irritation.  Use albuterol treatments at home as needed.  Please go to the emergency department if you feel your symptoms worsen or your shortness of breath increases.     ED Prescriptions     Medication Sig Dispense Auth. Provider   olopatadine ([facility]S OLOPATADINE HCL) 0.1 % ophthalmic solution Place 1 drop into both eyes 2 (two) times daily. 5 mL Soleil Mas, Wells Guiles, PA-C      PDMP not reviewed this encounter.   Langford Carias, Vernice Jefferson 07/06/21 3335

## 2021-07-06 NOTE — ED Triage Notes (Signed)
Pt states she woke up this am with left eye redness. Pt states she has been having vaginal bleeding for the past 3 weeks seen here last week for same.

## 2021-07-07 ENCOUNTER — Other Ambulatory Visit: Payer: Self-pay

## 2021-07-07 DIAGNOSIS — E785 Hyperlipidemia, unspecified: Secondary | ICD-10-CM | POA: Diagnosis not present

## 2021-07-10 DIAGNOSIS — E785 Hyperlipidemia, unspecified: Secondary | ICD-10-CM | POA: Diagnosis not present

## 2021-07-11 DIAGNOSIS — E785 Hyperlipidemia, unspecified: Secondary | ICD-10-CM | POA: Diagnosis not present

## 2021-07-12 DIAGNOSIS — E785 Hyperlipidemia, unspecified: Secondary | ICD-10-CM | POA: Diagnosis not present

## 2021-07-13 DIAGNOSIS — E785 Hyperlipidemia, unspecified: Secondary | ICD-10-CM | POA: Diagnosis not present

## 2021-07-14 ENCOUNTER — Other Ambulatory Visit: Payer: Self-pay

## 2021-07-14 DIAGNOSIS — E785 Hyperlipidemia, unspecified: Secondary | ICD-10-CM | POA: Diagnosis not present

## 2021-07-17 ENCOUNTER — Other Ambulatory Visit: Payer: Self-pay | Admitting: Family

## 2021-07-17 DIAGNOSIS — E785 Hyperlipidemia, unspecified: Secondary | ICD-10-CM | POA: Diagnosis not present

## 2021-07-18 DIAGNOSIS — E785 Hyperlipidemia, unspecified: Secondary | ICD-10-CM | POA: Diagnosis not present

## 2021-07-19 DIAGNOSIS — E785 Hyperlipidemia, unspecified: Secondary | ICD-10-CM | POA: Diagnosis not present

## 2021-07-20 DIAGNOSIS — E785 Hyperlipidemia, unspecified: Secondary | ICD-10-CM | POA: Diagnosis not present

## 2021-07-21 DIAGNOSIS — E785 Hyperlipidemia, unspecified: Secondary | ICD-10-CM | POA: Diagnosis not present

## 2021-07-24 DIAGNOSIS — J45909 Unspecified asthma, uncomplicated: Secondary | ICD-10-CM | POA: Diagnosis not present

## 2021-07-24 DIAGNOSIS — D869 Sarcoidosis, unspecified: Secondary | ICD-10-CM | POA: Diagnosis not present

## 2021-07-24 DIAGNOSIS — E785 Hyperlipidemia, unspecified: Secondary | ICD-10-CM | POA: Diagnosis not present

## 2021-07-24 DIAGNOSIS — J189 Pneumonia, unspecified organism: Secondary | ICD-10-CM | POA: Diagnosis not present

## 2021-07-25 DIAGNOSIS — E785 Hyperlipidemia, unspecified: Secondary | ICD-10-CM | POA: Diagnosis not present

## 2021-07-26 DIAGNOSIS — E785 Hyperlipidemia, unspecified: Secondary | ICD-10-CM | POA: Diagnosis not present

## 2021-07-27 ENCOUNTER — Encounter: Payer: Self-pay | Admitting: Nurse Practitioner

## 2021-07-27 DIAGNOSIS — E785 Hyperlipidemia, unspecified: Secondary | ICD-10-CM | POA: Diagnosis not present

## 2021-07-28 ENCOUNTER — Other Ambulatory Visit: Payer: Self-pay | Admitting: Nurse Practitioner

## 2021-07-28 DIAGNOSIS — M170 Bilateral primary osteoarthritis of knee: Secondary | ICD-10-CM

## 2021-07-28 DIAGNOSIS — R52 Pain, unspecified: Secondary | ICD-10-CM

## 2021-07-29 DIAGNOSIS — D869 Sarcoidosis, unspecified: Secondary | ICD-10-CM | POA: Diagnosis not present

## 2021-07-29 DIAGNOSIS — J849 Interstitial pulmonary disease, unspecified: Secondary | ICD-10-CM | POA: Diagnosis not present

## 2021-07-29 DIAGNOSIS — J439 Emphysema, unspecified: Secondary | ICD-10-CM | POA: Diagnosis not present

## 2021-07-31 ENCOUNTER — Other Ambulatory Visit: Payer: Self-pay | Admitting: Nurse Practitioner

## 2021-07-31 DIAGNOSIS — E785 Hyperlipidemia, unspecified: Secondary | ICD-10-CM | POA: Diagnosis not present

## 2021-07-31 DIAGNOSIS — M170 Bilateral primary osteoarthritis of knee: Secondary | ICD-10-CM

## 2021-08-01 DIAGNOSIS — E785 Hyperlipidemia, unspecified: Secondary | ICD-10-CM | POA: Diagnosis not present

## 2021-08-02 ENCOUNTER — Other Ambulatory Visit: Payer: Self-pay | Admitting: Nurse Practitioner

## 2021-08-02 ENCOUNTER — Other Ambulatory Visit: Payer: Self-pay | Admitting: Family Medicine

## 2021-08-02 ENCOUNTER — Other Ambulatory Visit: Payer: Self-pay

## 2021-08-02 ENCOUNTER — Ambulatory Visit (INDEPENDENT_AMBULATORY_CARE_PROVIDER_SITE_OTHER): Payer: Medicaid Other

## 2021-08-02 ENCOUNTER — Other Ambulatory Visit: Payer: Self-pay | Admitting: Family

## 2021-08-02 ENCOUNTER — Telehealth: Payer: Self-pay

## 2021-08-02 ENCOUNTER — Ambulatory Visit: Payer: Medicaid Other | Admitting: Orthopaedic Surgery

## 2021-08-02 ENCOUNTER — Encounter: Payer: Self-pay | Admitting: Orthopaedic Surgery

## 2021-08-02 VITALS — Ht 64.0 in | Wt 263.0 lb

## 2021-08-02 DIAGNOSIS — M1712 Unilateral primary osteoarthritis, left knee: Secondary | ICD-10-CM | POA: Diagnosis not present

## 2021-08-02 DIAGNOSIS — M170 Bilateral primary osteoarthritis of knee: Secondary | ICD-10-CM | POA: Diagnosis not present

## 2021-08-02 DIAGNOSIS — E785 Hyperlipidemia, unspecified: Secondary | ICD-10-CM | POA: Diagnosis not present

## 2021-08-02 MED ORDER — METHYLPREDNISOLONE ACETATE 40 MG/ML IJ SUSP
40.0000 mg | INTRAMUSCULAR | Status: AC | PRN
  Administered 2021-08-02: 40 mg via INTRA_ARTICULAR

## 2021-08-02 MED ORDER — BUPIVACAINE HCL 0.25 % IJ SOLN
2.0000 mL | INTRAMUSCULAR | Status: AC | PRN
  Administered 2021-08-02: 2 mL via INTRA_ARTICULAR

## 2021-08-02 MED ORDER — LIDOCAINE HCL 1 % IJ SOLN
2.0000 mL | INTRAMUSCULAR | Status: AC | PRN
  Administered 2021-08-02: 2 mL

## 2021-08-02 MED ORDER — ATORVASTATIN CALCIUM 80 MG PO TABS
ORAL_TABLET | Freq: Every day | ORAL | 1 refills | Status: DC
  Filled 2021-08-02: qty 30, 30d supply, fill #0

## 2021-08-02 MED ORDER — HYDROCHLOROTHIAZIDE 25 MG PO TABS
ORAL_TABLET | Freq: Every day | ORAL | 1 refills | Status: DC
  Filled 2021-08-02: qty 30, 30d supply, fill #0

## 2021-08-02 MED ORDER — ALBUTEROL SULFATE HFA 108 (90 BASE) MCG/ACT IN AERS
1.0000 | INHALATION_SPRAY | Freq: Four times a day (QID) | RESPIRATORY_TRACT | 1 refills | Status: DC | PRN
  Filled 2021-08-02: qty 18, 25d supply, fill #0

## 2021-08-02 MED ORDER — TRAMADOL HCL 50 MG PO TABS: 50.0000 mg | ORAL_TABLET | Freq: Two times a day (BID) | ORAL | 2 refills | Status: DC | PRN

## 2021-08-02 NOTE — Progress Notes (Signed)
Office Visit Note   Patient: Teresa Mendez           Date of Birth: 09-13-1975           MRN: [id number] Visit Date: 08/02/2021              Requested by: [name], NP [address] [place],  [place] [address] PCP: [name], NP   Assessment & Plan: Visit Diagnoses:  1. Primary osteoarthritis of both knees     Plan: Impression is bilateral knee osteoarthritis.  At this point, the patient has not had good relief from right knee cortisone injection.  We have discussed getting approval for viscosupplementation injection for which she is agreeable to.  Because she has not tried cortisone injection in the left knee, she would like to try this today.  If she does not have symptom relief in the left knee with cortisone injection over the next several weeks, she will call we will get approval for Visco injection.  Call with concerns or questions in meantime.  This patient is diagnosed with osteoarthritis of the knee(s).    Radiographs show evidence of joint space narrowing, osteophytes, subchondral sclerosis and/or subchondral cysts.  This patient has knee pain which interferes with functional and activities of daily living.    This patient has experienced inadequate response, adverse effects and/or intolerance with conservative treatments such as acetaminophen, NSAIDS, topical creams, physical therapy or regular exercise, knee bracing and/or weight loss.   This patient has experienced inadequate response or has a contraindication to intra articular steroid injections for at least 3 months.   This patient is not scheduled to have a total knee replacement within 6 months of starting treatment with viscosupplementation.  Follow-Up Instructions: Return for once approved for right knee visco inj.   Orders:  Orders Placed This Encounter  Procedures   Large Joint Inj: L knee   XR KNEE 3 VIEW LEFT   No orders of the defined types were placed in this  encounter.     Procedures: Large Joint Inj: L knee on 08/02/2021 1:54 PM Indications: pain Details: 22 G needle, anterolateral approach Medications: 2 mL lidocaine 1 %; 2 mL bupivacaine 0.25 %; 40 mg methylPREDNISolone acetate 40 MG/ML      Clinical Data: No additional findings.   Subjective: Chief Complaint  Patient presents with   Left Knee - Pain   Right Knee - Pain    HPI patient is a pleasant 46 year old female who complains of with bilateral knee pain right greater than left for the past several months.  She denies any injury or change in activity.  The pain is primarily to the medial aspect of both knees and is constant but aggravated by walking.  She has been using Aspercreme and taking Tylenol without significant relief.  She has seen Dr. Theodosia Paling for recently her right knee was injected with cortisone.  She says that this worsened her symptoms.  No previous cortisone injection to the left knee.  No previous viscosupplementation injection to either knee.  Review of Systems as detailed in HPI.  All others reviewed and are negative.   Objective: Vital Signs: Ht '5\' 4"'$  (1.626 m)   Wt 263 lb (119.3 kg)   LMP 06/16/2021 (Approximate)   BMI 45.14 kg/m   Physical Exam well-developed well-nourished female no acute distress.  Alert and oriented x3.  Ortho Exam bilateral knee exam shows trace effusions.  Range of motion 0 to 100  degrees.  Medial joint line tenderness.  Mild patellofemoral crepitus.  She is neurovascular tact distally.  Specialty Comments:  No specialty comments available.  Imaging: No results found.   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   Essential hypertension 06/27/2007   DYSPNEA 06/27/2007   Past Medical History:  Diagnosis Date   Asthma    Hypertension    Sarcoidosis    Sickle cell trait ([place])    Stress incontinence     Family History  Problem Relation Age of Onset   Hypertension Mother    Congestive Heart Failure Mother    Kidney failure Mother    Sickle cell trait Mother    Hypertension Father    Congestive Heart Failure Father    Kidney failure Father    Glaucoma Father    Hypertension Brother    Liver disease Brother    Sickle cell trait Son    Sickle cell trait Daughter    Sickle cell trait Daughter     Past Surgical History:  Procedure Laterality Date   BREAST SURGERY Right    biopsy   TUBAL LIGATION     Social History   Occupational History   Not on file  Tobacco Use   Smoking status: Former    Packs/day: 0.50    Types: Cigarettes    Quit date: 01/02/2019    Years since quitting: 2.5    Passive exposure: Current   Smokeless tobacco: Never  Vaping Use   Vaping Use: Former  Substance and Sexual Activity   Alcohol use: Yes    Comment: Occasionally    Drug use: No   Sexual activity: Yes

## 2021-08-02 NOTE — Telephone Encounter (Signed)
Right knee visco approval. Dr.Xu's patient.

## 2021-08-02 NOTE — Telephone Encounter (Signed)
Noted  

## 2021-08-03 ENCOUNTER — Other Ambulatory Visit: Payer: Self-pay

## 2021-08-03 DIAGNOSIS — E785 Hyperlipidemia, unspecified: Secondary | ICD-10-CM | POA: Diagnosis not present

## 2021-08-04 DIAGNOSIS — E785 Hyperlipidemia, unspecified: Secondary | ICD-10-CM | POA: Diagnosis not present

## 2021-08-07 DIAGNOSIS — E785 Hyperlipidemia, unspecified: Secondary | ICD-10-CM | POA: Diagnosis not present

## 2021-08-08 ENCOUNTER — Other Ambulatory Visit: Payer: Self-pay

## 2021-08-08 DIAGNOSIS — E785 Hyperlipidemia, unspecified: Secondary | ICD-10-CM | POA: Diagnosis not present

## 2021-08-09 ENCOUNTER — Other Ambulatory Visit: Payer: Self-pay

## 2021-08-09 DIAGNOSIS — E785 Hyperlipidemia, unspecified: Secondary | ICD-10-CM | POA: Diagnosis not present

## 2021-08-10 DIAGNOSIS — E785 Hyperlipidemia, unspecified: Secondary | ICD-10-CM | POA: Diagnosis not present

## 2021-08-11 DIAGNOSIS — E785 Hyperlipidemia, unspecified: Secondary | ICD-10-CM | POA: Diagnosis not present

## 2021-08-14 DIAGNOSIS — E785 Hyperlipidemia, unspecified: Secondary | ICD-10-CM | POA: Diagnosis not present

## 2021-08-15 DIAGNOSIS — E785 Hyperlipidemia, unspecified: Secondary | ICD-10-CM | POA: Diagnosis not present

## 2021-08-16 DIAGNOSIS — E785 Hyperlipidemia, unspecified: Secondary | ICD-10-CM | POA: Diagnosis not present

## 2021-08-17 ENCOUNTER — Telehealth: Payer: Self-pay | Admitting: Orthopaedic Surgery

## 2021-08-17 DIAGNOSIS — E785 Hyperlipidemia, unspecified: Secondary | ICD-10-CM | POA: Diagnosis not present

## 2021-08-17 NOTE — Telephone Encounter (Signed)
Lauren are you able to do this?  Thanks.

## 2021-08-17 NOTE — Telephone Encounter (Signed)
Pt called asking to pharmacy for pre auth for tramadol. Please send to pharmacy on file. Pt phone number is [phone].

## 2021-08-18 DIAGNOSIS — E785 Hyperlipidemia, unspecified: Secondary | ICD-10-CM | POA: Diagnosis not present

## 2021-08-18 NOTE — Telephone Encounter (Signed)
Tried to call patient for clarification since she has not had anything filled this week. "Call cannot be completed as dialed"

## 2021-08-21 DIAGNOSIS — E785 Hyperlipidemia, unspecified: Secondary | ICD-10-CM | POA: Diagnosis not present

## 2021-08-22 ENCOUNTER — Telehealth: Payer: Self-pay | Admitting: Orthopaedic Surgery

## 2021-08-22 DIAGNOSIS — E785 Hyperlipidemia, unspecified: Secondary | ICD-10-CM | POA: Diagnosis not present

## 2021-08-22 NOTE — Telephone Encounter (Signed)
Tried to call patient back. No answer. LMOM that her tramadol was denied by insurance and would have to be paid out of pocket.

## 2021-08-22 NOTE — Telephone Encounter (Signed)
T called requesting a call back from [employer].Pt is asking for her to call phone number [phone]

## 2021-08-22 NOTE — Telephone Encounter (Signed)
Tried calling patient. No answer. Patients insurance does not cover gel injection.

## 2021-08-23 DIAGNOSIS — E785 Hyperlipidemia, unspecified: Secondary | ICD-10-CM | POA: Diagnosis not present

## 2021-08-24 DIAGNOSIS — E785 Hyperlipidemia, unspecified: Secondary | ICD-10-CM | POA: Diagnosis not present

## 2021-08-24 NOTE — Telephone Encounter (Signed)
Pt states that the insurance told her for [place] to fax the perscription.   FAX : [phone]   Ref# [id number]

## 2021-08-24 NOTE — Telephone Encounter (Signed)
Tried to call patient. No answer. Left her know that we cannot fax scripts for controlled pain medications. I also told her again that I spoke with her insurance in length last Friday and sent in all requested documents at the time, and it was still denied. She may have to pay out of pocket for the tramadol if she wants it.

## 2021-08-25 DIAGNOSIS — E785 Hyperlipidemia, unspecified: Secondary | ICD-10-CM | POA: Diagnosis not present

## 2021-08-28 ENCOUNTER — Other Ambulatory Visit: Payer: Self-pay | Admitting: Nurse Practitioner

## 2021-08-28 DIAGNOSIS — E785 Hyperlipidemia, unspecified: Secondary | ICD-10-CM | POA: Diagnosis not present

## 2021-08-28 NOTE — Telephone Encounter (Signed)
Medication Refill - Medication: lisinopril (ZESTRIL) 40 MG tablet   omeprazole (PRILOSEC) 20 MG capsule    montelukast (SINGULAIR) 10 MG tablet    carvedilol (COREG) 3.125 MG tablet   amLODipine (NORVASC) 10 MG tablet [[id number]]  Has the patient contacted their pharmacy? Yes.   (Agent: If no, request that the patient contact the pharmacy for the refill. If patient does not wish to contact the pharmacy document the reason why and proceed with request.) (Agent: If yes, when and what did the pharmacy advise?) pt never filled these meds with this pharmacy   Preferred Pharmacy (with phone number or street name): My pharmacy  Has the patient been seen for an appointment in the last year OR does the patient have an upcoming appointment? Yes.    Agent: Please be advised that RX refills may take up to 3 business days. We ask that you follow-up with your pharmacy.

## 2021-08-29 DIAGNOSIS — D869 Sarcoidosis, unspecified: Secondary | ICD-10-CM | POA: Diagnosis not present

## 2021-08-29 DIAGNOSIS — J439 Emphysema, unspecified: Secondary | ICD-10-CM | POA: Diagnosis not present

## 2021-08-29 DIAGNOSIS — J849 Interstitial pulmonary disease, unspecified: Secondary | ICD-10-CM | POA: Diagnosis not present

## 2021-08-29 DIAGNOSIS — E785 Hyperlipidemia, unspecified: Secondary | ICD-10-CM | POA: Diagnosis not present

## 2021-08-29 MED ORDER — CARVEDILOL 3.125 MG PO TABS: ORAL_TABLET | Freq: Two times a day (BID) | ORAL | 0 refills | Status: DC

## 2021-08-29 MED ORDER — OMEPRAZOLE 20 MG PO CPDR: DELAYED_RELEASE_CAPSULE | Freq: Every day | ORAL | 0 refills | Status: DC

## 2021-08-29 MED ORDER — LISINOPRIL 40 MG PO TABS: ORAL_TABLET | Freq: Every day | ORAL | 0 refills | Status: DC

## 2021-08-29 MED ORDER — MONTELUKAST SODIUM 10 MG PO TABS: ORAL_TABLET | Freq: Every day | ORAL | 0 refills | Status: DC

## 2021-08-29 NOTE — Telephone Encounter (Signed)
Appointment 8/11 with primary care provider. Patient was given short term Rx while on vacation and is now requesting RF. Patient history indicates refills were possibly not up to date- but will RF until appointment 09/08/21. Requested Prescriptions  Pending Prescriptions Disp Refills  . lisinopril (ZESTRIL) 40 MG tablet 10 tablet 0    Sig: TAKE 1 TABLET (40 MG TOTAL) BY MOUTH DAILY.     Cardiovascular:  ACE Inhibitors Failed - 08/28/2021  2:15 PM      Failed - Cr in normal range and within 180 days    Creat  Date Value Ref Range Status  07/03/2019 0.85 0.50 - 1.10 mg/dL Final   Creatinine, Ser  Date Value Ref Range Status  03/25/2020 0.68 0.57 - 1.00 mg/dL Final    Comment:                   **Effective March 28, 2020 [facility] will begin**                  reporting the 2021 CKD-EPI creatinine equation that                  estimates kidney function without a race variable.    Creatinine, POC  Date Value Ref Range Status  08/22/2016 200 mg/dL Final         Failed - K in normal range and within 180 days    Potassium  Date Value Ref Range Status  03/25/2020 3.2 (L) 3.5 - 5.2 mmol/L Final         Passed - Patient is not pregnant      Passed - Last BP in normal range    BP Readings from Last 1 Encounters:  07/06/21 123/82         Passed - Valid encounter within last 6 months    Recent Outpatient Visits          5 months ago Stress incontinence of urine   [place], Vernia Buff, NP   7 months ago Uterine leiomyoma, unspecified location   [place], [place], [place]   9 months ago [place], Vernia Buff, NP   10 months ago Encounter for completion of form with patient   [place], Vernia Buff, NP   1 year ago Essential hypertension   [place], Vernia Buff, NP       Future Appointments            In 2 days Leandrew Koyanagi, MD [place]   In 2 days [place], Bonna Gains, MD [facility]   In 1 week [name], NP [place]           . omeprazole (PRILOSEC) 20 MG capsule 10 capsule 0    Sig: TAKE 1 CAPSULE (20 MG TOTAL) BY MOUTH DAILY.     Gastroenterology: Proton Pump Inhibitors Passed - 08/28/2021  2:15 PM      Passed - Valid encounter within last 12 months    Recent Outpatient Visits          5 months ago Stress incontinence of urine   [place], NP   7 months ago Uterine leiomyoma, unspecified location   [place],  Dionne Bucy, PA-C   9 months ago [place] [name], NP   10 months ago Encounter for completion of form with patient   [place], Vernia Buff, NP   1 year ago Essential hypertension   [place], Vernia Buff, NP      Future Appointments            In 2 days Leandrew Koyanagi, MD [place]   In 2 days Hunsucker, Bonna Gains, MD [facility]   In 1 week [name], NP [place]           . montelukast (SINGULAIR) 10 MG tablet 10 tablet 0    Sig: TAKE 1 TABLET (10 MG TOTAL) BY MOUTH AT BEDTIME.     Pulmonology:  Leukotriene Inhibitors Passed - 08/28/2021  2:15 PM      Passed - Valid encounter within last 12 months    Recent Outpatient Visits          5 months ago Stress incontinence of urine   [place], Vernia Buff, NP   7 months ago Uterine leiomyoma, unspecified location   [place], Dionne Bucy, [place]   9 months ago [place], Vernia Buff, NP    10 months ago Encounter for completion of form with patient   [place], Vernia Buff, NP   1 year ago Essential hypertension   [place], Vernia Buff, NP      Future Appointments            In 2 days Leandrew Koyanagi, MD [place]   In 2 days Hunsucker, Bonna Gains, MD [facility]   In 1 week [name], NP [place]           . carvedilol (COREG) 3.125 MG tablet 20 tablet 0    Sig: TAKE 1 TABLET (3.125 MG TOTAL) BY MOUTH 2 (TWO) TIMES DAILY WITH A MEAL.     Cardiovascular: Beta Blockers 3 Failed - 08/28/2021  2:15 PM      Failed - Cr in normal range and within 360 days    Creat  Date Value Ref Range Status  07/03/2019 0.85 0.50 - 1.10 mg/dL Final   Creatinine, Ser  Date Value Ref Range Status  03/25/2020 0.68 0.57 - 1.00 mg/dL Final    Comment:                   **Effective March 28, 2020 [facility] will begin**                  reporting the 2021 CKD-EPI creatinine equation that                  estimates kidney function without a race variable.    Creatinine, POC  Date Value Ref Range Status  08/22/2016 200 mg/dL Final         Failed - AST in normal range and within 360 days    AST  Date Value Ref Range Status  03/25/2020 16 0 - 40 IU/L Final         Failed - ALT in normal  range and within 360 days    ALT  Date Value Ref Range Status  03/25/2020 12 0 - 32 IU/L Final         Failed - Last Heart Rate in normal range    Pulse Readings from Last 1 Encounters:  07/06/21 (!) 111         Passed - Last BP in normal range    BP Readings from Last 1 Encounters:  07/06/21 123/82         Passed - Valid encounter within last 6 months    Recent Outpatient Visits          5 months ago Stress incontinence of urine   [place], Vernia Buff, NP   7 months ago Uterine leiomyoma, unspecified location    [place], [employer] [place], [place]   9 months ago [place], Vernia Buff, NP   10 months ago Encounter for completion of form with patient   [place], Vernia Buff, NP   1 year ago Essential hypertension   [place], Vernia Buff, NP      Future Appointments            In 2 days Leandrew Koyanagi, MD [place]   In 2 days Hunsucker, Bonna Gains, MD [facility]   In 1 week [name], NP [place]

## 2021-08-30 ENCOUNTER — Ambulatory Visit: Payer: Medicaid Other | Admitting: Pulmonary Disease

## 2021-08-30 DIAGNOSIS — E785 Hyperlipidemia, unspecified: Secondary | ICD-10-CM | POA: Diagnosis not present

## 2021-08-31 ENCOUNTER — Ambulatory Visit: Payer: Medicaid Other | Admitting: Orthopaedic Surgery

## 2021-08-31 ENCOUNTER — Encounter: Payer: Self-pay | Admitting: Pulmonary Disease

## 2021-08-31 ENCOUNTER — Ambulatory Visit (INDEPENDENT_AMBULATORY_CARE_PROVIDER_SITE_OTHER): Payer: Medicaid Other | Admitting: Pulmonary Disease

## 2021-08-31 VITALS — BP 128/88 | HR 96 | Temp 98.6°F | Ht 64.0 in | Wt 261.2 lb

## 2021-08-31 DIAGNOSIS — E785 Hyperlipidemia, unspecified: Secondary | ICD-10-CM | POA: Diagnosis not present

## 2021-08-31 DIAGNOSIS — J449 Chronic obstructive pulmonary disease, unspecified: Secondary | ICD-10-CM | POA: Diagnosis not present

## 2021-08-31 NOTE — Patient Instructions (Signed)
It is nice to see you again  I am not can make any medication changes today  We discussed adding a low-dose of an antibiotic on Monday Wednesday and Friday to treat inflammation and help reduce exacerbations of your breathing.  We will discuss this further at your next visit, let see how things go over the next couple months before making that decision.  No change to medications, please continue the Breztri 2 puffs twice a day every day.  Rinse your  mouth out with water after every use.  Return to clinic in 3 months or sooner as needed with [name]

## 2021-08-31 NOTE — Progress Notes (Signed)
Synopsis: Referred in 2021 for asthma by [name], NP. Formerly a patient of Dr. [name] in 2009.  Subjective:   PATIENT ID: [name] GENDER: female DOB: 1975-09-22, MRN: [id number]  Chief Complaint  Patient presents with   Follow-up    Sob good/bad days, cough-at night    Teresa Mendez is a 46 y.o. woman who presents for follow-up of chronic hypoxic respiratory failure due to COPD and sarcoidosis (previously biopsy-proven in 2009).  Also with asthma (sig BD response) that responds well to prednisone, worsens with outdoor activities.  She  continues on Zyrtec, montelukast, and Breztri.  She still using 3 L supplemental oxygen due to dropping to 90% on 1 L at home and tachycardia.  No recent treatment for sarcoidosis. Quit smoking 2021.   Patient feels okay today.  Good days and bad. Lost to follow up.  Lasting 05/2020.  At that time switch from high-dose Advair to [facility].  She does feel that the Judithann Sauger has helped.  She continues to report good adherence to this.  Is able to obtain it.  Feels like she can go outside now.  Spent a little bit of time outdoors over the last year.  Prior to that, she rarely get outdoors due to worsening dyspnea.  She is very thankful for this that she enjoys being outside.  However she has some days where she can spend very little time outside and some days she has been more.  Today is not a pretty good day.  A lot more dyspneic.  She did receive prednisone at least once a couple months ago.  Possibly another 1 in the winter 2022 although I cannot find documentation of this.  She thinks she got some but is not sure.     Past Medical History:  Diagnosis Date   Asthma    Hypertension    Sarcoidosis    Sickle cell trait ([place])    Stress incontinence      Family History  Problem Relation Age of Onset   Hypertension Mother    Congestive Heart Failure Mother    Kidney failure Mother    Sickle cell trait Mother    Hypertension Father    Congestive  Heart Failure Father    Kidney failure Father    Glaucoma Father    Hypertension Brother    Liver disease Brother    Sickle cell trait Son    Sickle cell trait Daughter    Sickle cell trait Daughter      Past Surgical History:  Procedure Laterality Date   BREAST SURGERY Right    biopsy   TUBAL LIGATION      Social History   Socioeconomic History   Marital status: Single    Spouse name: Not on file   Number of children: Not on file   Years of education: Not on file   Highest education level: Not on file  Occupational History   Not on file  Tobacco Use   Smoking status: Former    Packs/day: 0.50    Types: Cigarettes    Quit date: 01/02/2019    Years since quitting: 2.6    Passive exposure: Current   Smokeless tobacco: Never  Vaping Use   Vaping Use: Former  Substance and Sexual Activity   Alcohol use: Yes    Comment: Occasionally    Drug use: No   Sexual activity: Yes  Other Topics Concern   Not on file  Social History Narrative  Not on file   Social Determinants of Health   Financial Resource Strain: Not on file  Food Insecurity: No Food Insecurity (11/14/2020)   Hunger Vital Sign    Worried About Running Out of Food in the Last Year: Never true    Ran Out of Food in the Last Year: Never true  Transportation Needs: No Transportation Needs (11/14/2020)   PRAPARE - Hydrologist (Medical): No    Lack of Transportation (Non-Medical): No  Physical Activity: Inactive (11/14/2020)   Exercise Vital Sign    Days of Exercise per Week: 0 days    Minutes of Exercise per Session: 0 min  Stress: Not on file  Social Connections: Not on file  Intimate Partner Violence: Not At Risk (11/14/2020)   Humiliation, Afraid, Rape, and Kick questionnaire    Fear of Current or Ex-Partner: No    Emotionally Abused: No    Physically Abused: No    Sexually Abused: No     No Known Allergies   Immunization History  Administered Date(s)  Administered   Influenza,inj,Quad PF,6+ Mos 10/13/2019   PFIZER(Purple Top)SARS-COV-2 Vaccination 05/21/2020, 06/16/2020    Outpatient Medications Prior to Visit  Medication Sig Dispense Refill   albuterol (PROVENTIL) (2.5 MG/3ML) 0.083% nebulizer solution TAKE 3 MLS (2.5 MG TOTAL) BY NEBULIZATION EVERY 6 (SIX) HOURS AS NEEDED FOR WHEEZING OR SHORTNESS OF BREATH. 150 mL 11   albuterol (VENTOLIN HFA) 108 (90 Base) MCG/ACT inhaler Inhale 1-2 puffs into the lungs every 6 (six) hours as needed for wheezing or shortness of breath. 18 g 1   amLODipine (NORVASC) 10 MG tablet TAKE 1 TABLET (10 MG TOTAL) BY MOUTH DAILY. 5 tablet 0   aspirin EC 81 MG tablet Take 81 mg by mouth daily.     atorvastatin (LIPITOR) 80 MG tablet TAKE 1 TABLET (80 MG TOTAL) BY MOUTH DAILY. 30 tablet 1   Budeson-Glycopyrrol-Formoterol (BREZTRI AEROSPHERE) 160-9-4.8 MCG/ACT AERO Inhale 2 puffs into the lungs in the morning and at bedtime. 10.7 g 11   carvedilol (COREG) 3.125 MG tablet TAKE 1 TABLET (3.125 MG TOTAL) BY MOUTH 2 (TWO) TIMES DAILY WITH A MEAL. 20 tablet 0   cetirizine (ZYRTEC) 10 MG tablet TAKE 1 TABLET (10 MG TOTAL) BY MOUTH DAILY. 30 tablet 0   DULoxetine (CYMBALTA) 60 MG capsule TAKE 1 CAPSULE (60 MG TOTAL) BY MOUTH DAILY. 5 capsule 0   fluticasone (FLONASE) 50 MCG/ACT nasal spray Place 2 sprays into both nostrils daily. 16 g 2   hydrochlorothiazide (HYDRODIURIL) 25 MG tablet TAKE 1 TABLET (25 MG TOTAL) BY MOUTH DAILY. 30 tablet 1   lisinopril (ZESTRIL) 40 MG tablet TAKE 1 TABLET (40 MG TOTAL) BY MOUTH DAILY. 10 tablet 0   Masks MISC Nebulizer face mask J45.909 1 each 0   montelukast (SINGULAIR) 10 MG tablet TAKE 1 TABLET (10 MG TOTAL) BY MOUTH AT BEDTIME. 10 tablet 0   olopatadine ([employer] OLOPATADINE HCL) 0.1 % ophthalmic solution Place 1 drop into both eyes 2 (two) times daily. 5 mL 12   omeprazole (PRILOSEC) 20 MG capsule TAKE 1 CAPSULE (20 MG TOTAL) BY MOUTH DAILY. 10 capsule 0   OXYGEN Inhale into the lungs. 3  liters     traMADol (ULTRAM) 50 MG tablet Take 1 tablet (50 mg total) by mouth 2 (two) times daily as needed. 30 tablet 2   triamcinolone ointment (KENALOG) 0.1 % Apply 1 application topically 2 (two) times daily. 60 g 0   Budeson-Glycopyrrol-Formoterol (BREZTRI  AEROSPHERE) 160-9-4.8 MCG/ACT AERO Inhale 2 puffs into the lungs in the morning and at bedtime. 4.8 g 0   famotidine (PEPCID) 20 MG tablet Take 1 tablet (20 mg total) by mouth at bedtime. (Patient not taking: Reported on 05/04/2021) 90 tablet 3   ferrous sulfate 325 (65 FE) MG tablet Take 1 tablet (325 mg total) by mouth daily with breakfast. 30 tablet 2   omeprazole (PRILOSEC) 20 MG capsule TAKE 1 CAPSULE (20 MG TOTAL) BY MOUTH DAILY. 30 capsule 2   potassium chloride SA (KLOR-CON M) 20 MEQ tablet TAKE 1 TABLET (20 MEQ TOTAL) BY MOUTH DAILY FOR 14 DAYS. (Patient taking differently: as needed.) 14 tablet 0   No facility-administered medications prior to visit.      Objective:   Vitals:   08/31/21 1204  BP: 128/88  Pulse: 96  Temp: 98.6 F (37 C)  TempSrc: Temporal  SpO2: 99%  Weight: 261 lb 3.2 oz (118.5 kg)  Height: 5' 4"  (1.626 m)   99% on 3 LPM    (83% on RA) BMI Readings from Last 3 Encounters:  08/31/21 44.83 kg/m  08/02/21 45.14 kg/m  05/04/21 44.63 kg/m   Wt Readings from Last 3 Encounters:  08/31/21 261 lb 3.2 oz (118.5 kg)  08/02/21 263 lb (119.3 kg)  05/04/21 260 lb (117.9 kg)    Physical exam: General: Well-appearing, no acute distress Eyes: EOMI, no icterus Pulmonary: Distant sounds, clear to auscultation bilaterally, normal work of breathing on nasal cannula Cardiovascular: Tachycardic, regular rhythm  CBC    Component Value Date/Time   WBC 9.3 01/26/2021 1016   WBC 11.2 (H) 11/27/2019 0117   RBC 3.94 01/26/2021 1016   RBC 4.08 11/27/2019 0117   HGB 9.2 (L) 01/26/2021 1016   HCT 29.8 (L) 01/26/2021 1016   PLT 454 (H) 01/26/2021 1016   MCV 76 (L) 01/26/2021 1016   MCH 23.4 (L) 01/26/2021  1016   MCH 25.5 (L) 11/27/2019 0117   MCHC 30.9 (L) 01/26/2021 1016   MCHC 31.0 11/27/2019 0117   RDW 14.9 01/26/2021 1016   LYMPHSABS 2.6 01/26/2021 1016   MONOABS 0.4 05/22/2019 0702   EOSABS 0.2 01/26/2021 1016   BASOSABS 0.0 01/26/2021 1016    CHEMISTRY No results for input(s): "NA", "K", "CL", "CO2", "GLUCOSE", "BUN", "CREATININE", "CALCIUM", "MG", "PHOS" in the last 168 hours. CrCl cannot be calculated (Patient's most recent lab result is older than the maximum 21 days allowed.).   HIV negative 04/2019 A1AT WNL  CRP 1.3 (WNL) on 07/07/19 ESR 40 on 07/07/19  Chest Imaging- films reviewed: CT scan reviewed- Paraseptal and centrilobular emphysema with blebs. Possibly increased GGO throughout remaining lung tissue. No PE.  Pulmonary Functions Testing Results:    Latest Ref Rng & Units 07/03/2019    2:52 PM  PFT Results  FVC-Pre L 1.89   FVC-Predicted Pre % 60   FVC-Post L 2.41   FVC-Predicted Post % 76   Pre FEV1/FVC % % 47   Post FEV1/FCV % % 50   FEV1-Pre L 0.89   FEV1-Predicted Pre % 34   FEV1-Post L 1.20   DLCO uncorrected ml/min/mmHg 17.25   DLCO UNC% % 76   DLCO corrected ml/min/mmHg 19.39   DLCO COR %Predicted % 86   DLVA Predicted % 116   TLC L 4.65   TLC % Predicted % 88   RV % Predicted % 140    2021- severe obstruction with significant bronchodilator reversibility.  Air trapping without hyperinflation or restriction.  Mild diffusion impairment.  Flow volume loop supports mixed obstruction and restriction.  Echocardiogram 05/18/19: LVEF 60-65%, mild LVH, indeterminate diastolic function. Mildly dilated LA. Normal RV function. Trivial AI. Ascending aortic aneurysm.      Assessment & Plan:     ICD-10-CM   1. 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     Current Outpatient Medications:    albuterol (PROVENTIL) (2.5 MG/3ML) 0.083% nebulizer solution, TAKE 3 MLS (2.5 MG TOTAL) BY NEBULIZATION EVERY 6 (SIX) HOURS AS NEEDED FOR WHEEZING OR SHORTNESS OF BREATH., Disp: 150 mL, Rfl: 11   albuterol (VENTOLIN HFA) 108 (90 Base) MCG/ACT inhaler, Inhale 1-2 puffs into the lungs every 6 (six) hours as needed for wheezing or shortness of breath., Disp: 18 g, Rfl: 1   amLODipine (NORVASC) 10 MG tablet, TAKE 1 TABLET (10 MG TOTAL) BY MOUTH DAILY., Disp: 5 tablet, Rfl: 0   aspirin EC 81 MG tablet, Take 81 mg by mouth daily., Disp: , Rfl:    atorvastatin (LIPITOR) 80 MG tablet, TAKE 1 TABLET (80 MG TOTAL) BY MOUTH DAILY., Disp: 30 tablet, Rfl: 1   Budeson-Glycopyrrol-Formoterol (BREZTRI AEROSPHERE) 160-9-4.8 MCG/ACT AERO, Inhale 2 puffs into the lungs in the morning and at bedtime., Disp: 10.7 g, Rfl: 11   carvedilol (COREG) 3.125 MG tablet, TAKE 1 TABLET (3.125 MG TOTAL) BY MOUTH 2 (TWO) TIMES DAILY WITH A MEAL., Disp: 20 tablet, Rfl: 0   cetirizine (ZYRTEC) 10 MG tablet, TAKE 1 TABLET (10 MG TOTAL) BY MOUTH DAILY., Disp: 30 tablet, Rfl: 0   DULoxetine (CYMBALTA) 60 MG capsule, TAKE 1 CAPSULE (60 MG TOTAL) BY MOUTH DAILY., Disp: 5 capsule, Rfl: 0   fluticasone (FLONASE) 50 MCG/ACT nasal spray, Place 2 sprays into both nostrils daily., Disp: 16 g, Rfl: 2   hydrochlorothiazide (HYDRODIURIL) 25 MG tablet, TAKE 1 TABLET (25 MG TOTAL) BY MOUTH DAILY., Disp: 30 tablet, Rfl: 1   lisinopril (ZESTRIL) 40 MG tablet, TAKE 1 TABLET (40 MG TOTAL) BY MOUTH DAILY., Disp: 10 tablet, Rfl: 0   Masks MISC, Nebulizer face mask J45.909, Disp: 1 each, Rfl: 0   montelukast (SINGULAIR) 10 MG  tablet, TAKE 1 TABLET (10 MG TOTAL) BY MOUTH AT BEDTIME., Disp: 10 tablet, Rfl: 0   olopatadine ([facility]S OLOPATADINE HCL) 0.1 % ophthalmic solution, Place 1 drop into both eyes 2 (two) times daily., Disp: 5 mL, Rfl: 12   omeprazole (PRILOSEC) 20 MG capsule, TAKE 1 CAPSULE (20 MG TOTAL) BY MOUTH DAILY., Disp: 10 capsule, Rfl: 0   OXYGEN, Inhale into the lungs. 3 liters, Disp: , Rfl:    traMADol (ULTRAM) 50 MG tablet, Take 1 tablet (50 mg total) by mouth 2 (two) times daily as needed., Disp: 30 tablet, Rfl: 2   triamcinolone ointment (KENALOG) 0.1 %, Apply 1 application topically 2 (two) times daily., Disp: 60 g, Rfl: 0   Budeson-Glycopyrrol-Formoterol (BREZTRI AEROSPHERE) 160-9-4.8 MCG/ACT AERO, Inhale 2 puffs into the lungs in the morning and at bedtime., Disp: 4.8 g, Rfl: 0   famotidine (PEPCID) 20 MG tablet, Take 1 tablet (20 mg total) by mouth at bedtime. (Patient not taking:  Reported on 05/04/2021), Disp: 90 tablet, Rfl: 3   ferrous sulfate 325 (65 FE) MG tablet, Take 1 tablet (325 mg total) by mouth daily with breakfast., Disp: 30 tablet, Rfl: 2   omeprazole (PRILOSEC) 20 MG capsule, TAKE 1 CAPSULE (20 MG TOTAL) BY MOUTH DAILY., Disp: 30 capsule, Rfl: 2   potassium chloride SA (KLOR-CON M) 20 MEQ tablet, TAKE 1 TABLET (20 MEQ TOTAL) BY MOUTH DAILY FOR 14 DAYS. (Patient taking differently: as needed.), Disp: 14 tablet, Rfl: 0     Lanier Clam, MD [facility] 08/31/2021 12:45 PM

## 2021-09-01 DIAGNOSIS — E785 Hyperlipidemia, unspecified: Secondary | ICD-10-CM | POA: Diagnosis not present

## 2021-09-04 DIAGNOSIS — E785 Hyperlipidemia, unspecified: Secondary | ICD-10-CM | POA: Diagnosis not present

## 2021-09-04 DIAGNOSIS — R32 Unspecified urinary incontinence: Secondary | ICD-10-CM | POA: Diagnosis not present

## 2021-09-05 ENCOUNTER — Ambulatory Visit (INDEPENDENT_AMBULATORY_CARE_PROVIDER_SITE_OTHER): Payer: Medicaid Other | Admitting: Orthopaedic Surgery

## 2021-09-05 DIAGNOSIS — M170 Bilateral primary osteoarthritis of knee: Secondary | ICD-10-CM

## 2021-09-05 DIAGNOSIS — E785 Hyperlipidemia, unspecified: Secondary | ICD-10-CM | POA: Diagnosis not present

## 2021-09-05 NOTE — Progress Notes (Signed)
Office Visit Note   Patient: Teresa Mendez           Date of Birth: 1975-05-04           MRN: [id number] Visit Date: 09/05/2021              Requested by: [name], NP [place],  [place] [address] PCP: [name], NP   Assessment & Plan: Visit Diagnoses:  1. Primary osteoarthritis of both knees     Plan: Impression is chronic right knee pain.  Pain seems to be out of proportion with radiographic findings.  Will order MRI to assess further.  If findings are nonsurgical we will likely refer patient back to Dr. Dimas [place] for further management.  Follow-Up Instructions: No follow-ups on file.   Orders:  Orders Placed This Encounter  Procedures   MR Knee Right w/o contrast   No orders of the defined types were placed in this encounter.     Procedures: No procedures performed   Clinical Data: No additional findings.   Subjective: Chief Complaint  Patient presents with   Right Knee - Pain    HPI Teresa Mendez returns today for constant right knee pain.  Tramadol does not help.  She had a cortisone injection about 3 months ago which she states did not help. Review of Systems  Constitutional: Negative.   HENT: Negative.    Eyes: Negative.   Respiratory: Negative.    Cardiovascular: Negative.   Endocrine: Negative.   Musculoskeletal: Negative.   Neurological: Negative.   Hematological: Negative.   Psychiatric/Behavioral: Negative.    All other systems reviewed and are negative.    Objective: Vital Signs: There were no vitals taken for this visit.  Physical Exam Vitals and nursing note reviewed.  Constitutional:      Appearance: She is well-developed.  HENT:     Head: Atraumatic.     Nose: Nose normal.  Eyes:     Extraocular Movements: Extraocular movements intact.  Cardiovascular:     Pulses: Normal pulses.  Pulmonary:     Effort: Pulmonary effort is normal.  Abdominal:     Palpations: Abdomen is soft.   Musculoskeletal:     Cervical back: Neck supple.  Skin:    General: Skin is warm.     Capillary Refill: Capillary refill takes less than 2 seconds.  Neurological:     Mental Status: She is alert. Mental status is at baseline.  Psychiatric:        Behavior: Behavior normal.        Thought Content: Thought content normal.        Judgment: Judgment normal.     Ortho Exam Examination of the right knee shows no joint effusion.  Diffuse tenderness.  No signs of infection. Specialty Comments:  No specialty comments available.  Imaging: No results found.   PMFS History: Patient Active Problem List   Diagnosis Date Noted   Primary osteoarthritis of both knees 09/05/2021   Former smoker 03/24/2020   Breast nodule 03/24/2020   GERD (gastroesophageal reflux disease) 09/07/2019   Ground glass opacity present on imaging of lung 09/07/2019   Interstitial pulmonary disease ([place]) 09/07/2019   Emphysema lung ([place]) 07/03/2019   Abnormal findings on diagnostic imaging of lung 07/03/2019   Myalgia 07/03/2019   Hypertensive urgency 05/18/2019   Chest pain 05/18/2019   Acute respiratory failure with hypoxia ([place]) 05/17/2019   PULMONARY SARCOIDOSIS 07/14/2007   Asthma-COPD overlap syndrome (  [place]) 07/14/2007   Essential hypertension 06/27/2007   DYSPNEA 06/27/2007   Past Medical History:  Diagnosis Date   Asthma    Hypertension    Sarcoidosis    Sickle cell trait ([place])    Stress incontinence     Family History  Problem Relation Age of Onset   Hypertension Mother    Congestive Heart Failure Mother    Kidney failure Mother    Sickle cell trait Mother    Hypertension Father    Congestive Heart Failure Father    Kidney failure Father    Glaucoma Father    Hypertension Brother    Liver disease Brother    Sickle cell trait Son    Sickle cell trait Daughter    Sickle cell trait Daughter     Past Surgical History:  Procedure Laterality Date   BREAST SURGERY Right    biopsy    TUBAL LIGATION     Social History   Occupational History   Not on file  Tobacco Use   Smoking status: Former    Packs/day: 0.50    Types: Cigarettes    Quit date: 01/02/2019    Years since quitting: 2.6    Passive exposure: Current   Smokeless tobacco: Never  Vaping Use   Vaping Use: Former  Substance and Sexual Activity   Alcohol use: Yes    Comment: Occasionally    Drug use: No   Sexual activity: Yes

## 2021-09-06 DIAGNOSIS — E785 Hyperlipidemia, unspecified: Secondary | ICD-10-CM | POA: Diagnosis not present

## 2021-09-07 DIAGNOSIS — E785 Hyperlipidemia, unspecified: Secondary | ICD-10-CM | POA: Diagnosis not present

## 2021-09-08 ENCOUNTER — Ambulatory Visit: Payer: Medicaid Other | Attending: Nurse Practitioner | Admitting: Nurse Practitioner

## 2021-09-08 ENCOUNTER — Other Ambulatory Visit: Payer: Self-pay

## 2021-09-08 ENCOUNTER — Encounter: Payer: Self-pay | Admitting: Nurse Practitioner

## 2021-09-08 VITALS — BP 156/100 | HR 87 | Ht 64.0 in | Wt 264.4 lb

## 2021-09-08 DIAGNOSIS — D649 Anemia, unspecified: Secondary | ICD-10-CM | POA: Diagnosis not present

## 2021-09-08 DIAGNOSIS — Z6841 Body Mass Index (BMI) 40.0 and over, adult: Secondary | ICD-10-CM | POA: Diagnosis not present

## 2021-09-08 DIAGNOSIS — Z1211 Encounter for screening for malignant neoplasm of colon: Secondary | ICD-10-CM | POA: Diagnosis not present

## 2021-09-08 DIAGNOSIS — J849 Interstitial pulmonary disease, unspecified: Secondary | ICD-10-CM

## 2021-09-08 DIAGNOSIS — Z1231 Encounter for screening mammogram for malignant neoplasm of breast: Secondary | ICD-10-CM | POA: Diagnosis not present

## 2021-09-08 DIAGNOSIS — E785 Hyperlipidemia, unspecified: Secondary | ICD-10-CM | POA: Diagnosis not present

## 2021-09-08 DIAGNOSIS — M2550 Pain in unspecified joint: Secondary | ICD-10-CM | POA: Diagnosis not present

## 2021-09-08 DIAGNOSIS — K219 Gastro-esophageal reflux disease without esophagitis: Secondary | ICD-10-CM | POA: Diagnosis not present

## 2021-09-08 DIAGNOSIS — M170 Bilateral primary osteoarthritis of knee: Secondary | ICD-10-CM

## 2021-09-08 DIAGNOSIS — I10 Essential (primary) hypertension: Secondary | ICD-10-CM

## 2021-09-08 MED ORDER — CARVEDILOL 6.25 MG PO TABS
6.2500 mg | ORAL_TABLET | Freq: Two times a day (BID) | ORAL | 1 refills | Status: DC
  Filled 2021-09-08: qty 180, 90d supply, fill #0
  Filled 2021-12-27: qty 180, 90d supply, fill #1

## 2021-09-08 MED ORDER — FAMOTIDINE 20 MG PO TABS: 20.0000 mg | ORAL_TABLET | Freq: Every day | ORAL | 3 refills | Status: DC

## 2021-09-08 MED ORDER — OMEPRAZOLE 20 MG PO CPDR
20.0000 mg | DELAYED_RELEASE_CAPSULE | Freq: Every day | ORAL | 1 refills | Status: DC
  Filled 2021-09-08: qty 90, 90d supply, fill #0
  Filled 2021-12-27: qty 90, 90d supply, fill #1

## 2021-09-08 MED ORDER — DULOXETINE HCL 60 MG PO CPEP
60.0000 mg | ORAL_CAPSULE | Freq: Every day | ORAL | 1 refills | Status: DC
  Filled 2021-09-08: qty 90, 90d supply, fill #0
  Filled 2021-12-27: qty 90, 90d supply, fill #1

## 2021-09-08 MED ORDER — ACETAMINOPHEN-CODEINE 300-30 MG PO TABS
1.0000 | ORAL_TABLET | ORAL | 0 refills | Status: DC | PRN
  Filled 2021-09-08: qty 60, 5d supply, fill #0

## 2021-09-08 MED ORDER — ATORVASTATIN CALCIUM 80 MG PO TABS
80.0000 mg | ORAL_TABLET | Freq: Every day | ORAL | 1 refills | Status: DC
  Filled 2021-09-08: qty 90, 90d supply, fill #0
  Filled 2021-12-27: qty 90, 90d supply, fill #1

## 2021-09-08 MED ORDER — BREZTRI AEROSPHERE 160-9-4.8 MCG/ACT IN AERO
2.0000 | INHALATION_SPRAY | Freq: Two times a day (BID) | RESPIRATORY_TRACT | 11 refills | Status: DC
  Filled 2021-09-08: qty 10.7, 30d supply, fill #0

## 2021-09-08 NOTE — Progress Notes (Unsigned)
Assessment & Plan:  Teresa Mendez was seen today for medication refill and hypertension.  Diagnoses and all orders for this visit:  Primary hypertension -     carvedilol (COREG) 6.25 MG tablet; Take 1 tablet (6.25 mg total) by mouth 2 (two) times daily with a meal. -     CMP14+EGFR  Gastroesophageal reflux disease without esophagitis -     famotidine (PEPCID) 20 MG tablet; Take 1 tablet (20 mg total) by mouth at bedtime. -     omeprazole (PRILOSEC) 20 MG capsule; Take 1 capsule (20 mg total) by mouth daily.  Colon cancer screening -     Cologuard  Breast cancer screening by mammogram -     MM DIAG BREAST TOMO BILATERAL; Future  Primary osteoarthritis of both knees -     acetaminophen-codeine (TYLENOL #3) 300-30 MG tablet; Take 1-2 tablets by mouth every 4 (four) hours as needed for moderate pain.  Dyslipidemia, goal LDL below 100 -     atorvastatin (LIPITOR) 80 MG tablet; Take 1 tablet (80 mg total) by mouth daily. -     Lipid panel  Anemia, unspecified type -     CBC with Differential  Morbid obesity with BMI of 45.0-49.9, adult (HCC)  Interstitial pulmonary disease (HCC) -     Budeson-Glycopyrrol-Formoterol (BREZTRI AEROSPHERE) 160-9-4.8 MCG/ACT AERO; Inhale 2 puffs into the lungs in the morning and at bedtime.  Other orders -     DULoxetine (CYMBALTA) 60 MG capsule; Take 1 capsule (60 mg total) by mouth daily.    Patient has been counseled on age-appropriate routine health concerns for screening and prevention. These are reviewed and up-to-date. Referrals have been placed accordingly. Immunizations are up-to-date or declined.    Subjective:   Chief Complaint  Patient presents with   Medication Refill   Hypertension   HPI ARRIA Teresa Mendez 46 y.o. female presents to office today    Blood pressure at home usually  BP Readings from Last 3 Encounters:  09/08/21 (!) 156/100  08/31/21 128/88  07/06/21 123/82    ROS  Past Medical History:  Diagnosis Date   Asthma     Hypertension    Sarcoidosis    Sickle cell trait ([place])    Stress incontinence     Past Surgical History:  Procedure Laterality Date   BREAST SURGERY Right    biopsy   TUBAL LIGATION      Family History  Problem Relation Age of Onset   Hypertension Mother    Congestive Heart Failure Mother    Kidney failure Mother    Sickle cell trait Mother    Hypertension Father    Congestive Heart Failure Father    Kidney failure Father    Glaucoma Father    Hypertension Brother    Liver disease Brother    Sickle cell trait Son    Sickle cell trait Daughter    Sickle cell trait Daughter     Social History Reviewed with no changes to be made today.   Outpatient Medications Prior to Visit  Medication Sig Dispense Refill   albuterol (PROVENTIL) (2.5 MG/3ML) 0.083% nebulizer solution TAKE 3 MLS (2.5 MG TOTAL) BY NEBULIZATION EVERY 6 (SIX) HOURS AS NEEDED FOR WHEEZING OR SHORTNESS OF BREATH. 150 mL 11   albuterol (VENTOLIN HFA) 108 (90 Base) MCG/ACT inhaler Inhale 1-2 puffs into the lungs every 6 (six) hours as needed for wheezing or shortness of breath. 18 g 1   amLODipine (NORVASC) 10 MG tablet TAKE 1 TABLET (  10 MG TOTAL) BY MOUTH DAILY. 5 tablet 0   aspirin EC 81 MG tablet Take 81 mg by mouth daily.     cetirizine (ZYRTEC) 10 MG tablet TAKE 1 TABLET (10 MG TOTAL) BY MOUTH DAILY. 30 tablet 0   fluticasone (FLONASE) 50 MCG/ACT nasal spray Place 2 sprays into both nostrils daily. 16 g 2   hydrochlorothiazide (HYDRODIURIL) 25 MG tablet TAKE 1 TABLET (25 MG TOTAL) BY MOUTH DAILY. 30 tablet 1   lisinopril (ZESTRIL) 40 MG tablet TAKE 1 TABLET (40 MG TOTAL) BY MOUTH DAILY. 10 tablet 0   Masks MISC Nebulizer face mask J45.909 1 each 0   montelukast (SINGULAIR) 10 MG tablet TAKE 1 TABLET (10 MG TOTAL) BY MOUTH AT BEDTIME. 10 tablet 0   olopatadine ([employer] OLOPATADINE HCL) 0.1 % ophthalmic solution Place 1 drop into both eyes 2 (two) times daily. 5 mL 12   OXYGEN Inhale into the lungs. 3 liters      triamcinolone ointment (KENALOG) 0.1 % Apply 1 application topically 2 (two) times daily. 60 g 0   atorvastatin (LIPITOR) 80 MG tablet TAKE 1 TABLET (80 MG TOTAL) BY MOUTH DAILY. 30 tablet 1   Budeson-Glycopyrrol-Formoterol (BREZTRI AEROSPHERE) 160-9-4.8 MCG/ACT AERO Inhale 2 puffs into the lungs in the morning and at bedtime. 4.8 g 0   Budeson-Glycopyrrol-Formoterol (BREZTRI AEROSPHERE) 160-9-4.8 MCG/ACT AERO Inhale 2 puffs into the lungs in the morning and at bedtime. 10.7 g 11   carvedilol (COREG) 3.125 MG tablet TAKE 1 TABLET (3.125 MG TOTAL) BY MOUTH 2 (TWO) TIMES DAILY WITH A MEAL. 20 tablet 0   DULoxetine (CYMBALTA) 60 MG capsule TAKE 1 CAPSULE (60 MG TOTAL) BY MOUTH DAILY. 5 capsule 0   omeprazole (PRILOSEC) 20 MG capsule TAKE 1 CAPSULE (20 MG TOTAL) BY MOUTH DAILY. 30 capsule 2   omeprazole (PRILOSEC) 20 MG capsule TAKE 1 CAPSULE (20 MG TOTAL) BY MOUTH DAILY. 10 capsule 0   traMADol (ULTRAM) 50 MG tablet Take 1 tablet (50 mg total) by mouth 2 (two) times daily as needed. 30 tablet 2   ferrous sulfate 325 (65 FE) MG tablet Take 1 tablet (325 mg total) by mouth daily with breakfast. 30 tablet 2   potassium chloride SA (KLOR-CON M) 20 MEQ tablet TAKE 1 TABLET (20 MEQ TOTAL) BY MOUTH DAILY FOR 14 DAYS. (Patient taking differently: as needed.) 14 tablet 0   famotidine (PEPCID) 20 MG tablet Take 1 tablet (20 mg total) by mouth at bedtime. (Patient not taking: Reported on 05/04/2021) 90 tablet 3   No facility-administered medications prior to visit.    No Known Allergies     Objective:    BP (!) 156/100   Pulse 87   Ht 5' 4"  (1.626 m)   Wt 264 lb 6.4 oz (119.9 kg)   SpO2 96%   BMI 45.38 kg/m  Wt Readings from Last 3 Encounters:  09/08/21 264 lb 6.4 oz (119.9 kg)  08/31/21 261 lb 3.2 oz (118.5 kg)  08/02/21 263 lb (119.3 kg)    Physical Exam       Patient has been counseled extensively about nutrition and exercise as well as the importance of adherence with medications and  regular follow-up. The patient was given clear instructions to go to ER or return to medical center if symptoms don't improve, worsen or new problems develop. The patient verbalized understanding.   Follow-up: Return for PAP SMEAR first of november.   [name], FNP-BC [place]  [place], [place]   09/08/2021, 2:01 PM

## 2021-09-09 ENCOUNTER — Other Ambulatory Visit: Payer: Self-pay | Admitting: Nurse Practitioner

## 2021-09-09 DIAGNOSIS — Z1211 Encounter for screening for malignant neoplasm of colon: Secondary | ICD-10-CM

## 2021-09-09 DIAGNOSIS — D649 Anemia, unspecified: Secondary | ICD-10-CM

## 2021-09-09 LAB — CBC WITH DIFFERENTIAL/PLATELET
Basophils Absolute: 0 10*3/uL (ref 0.0–0.2)
Basos: 0 %
EOS (ABSOLUTE): 0.2 10*3/uL (ref 0.0–0.4)
Eos: 2 %
Hematocrit: 32.9 % — ABNORMAL LOW (ref 34.0–46.6)
Hemoglobin: 10.1 g/dL — ABNORMAL LOW (ref 11.1–15.9)
Immature Grans (Abs): 0 10*3/uL (ref 0.0–0.1)
Immature Granulocytes: 0 %
Lymphocytes Absolute: 2.3 10*3/uL (ref 0.7–3.1)
Lymphs: 29 %
MCH: 22.1 pg — ABNORMAL LOW (ref 26.6–33.0)
MCHC: 30.7 g/dL — ABNORMAL LOW (ref 31.5–35.7)
MCV: 72 fL — ABNORMAL LOW (ref 79–97)
Monocytes Absolute: 0.5 10*3/uL (ref 0.1–0.9)
Monocytes: 7 %
Neutrophils Absolute: 5 10*3/uL (ref 1.4–7.0)
Neutrophils: 62 %
Platelets: 346 10*3/uL (ref 150–450)
RBC: 4.56 x10E6/uL (ref 3.77–5.28)
RDW: 15.9 % — ABNORMAL HIGH (ref 11.7–15.4)
WBC: 8.1 10*3/uL (ref 3.4–10.8)

## 2021-09-09 LAB — LIPID PANEL
Chol/HDL Ratio: 4 ratio (ref 0.0–4.4)
Cholesterol, Total: 179 mg/dL (ref 100–199)
HDL: 45 mg/dL (ref >39)
LDL Chol Calc (NIH): 123 mg/dL — ABNORMAL HIGH (ref 0–99)
Triglycerides: 55 mg/dL (ref 0–149)
VLDL Cholesterol Cal: 11 mg/dL (ref 5–40)

## 2021-09-09 LAB — CMP14+EGFR
ALT: 22 IU/L (ref 0–32)
AST: 19 IU/L (ref 0–40)
Albumin/Globulin Ratio: 1.5 (ref 1.2–2.2)
Albumin: 4.3 g/dL (ref 3.9–4.9)
Alkaline Phosphatase: 97 IU/L (ref 44–121)
BUN/Creatinine Ratio: 15 (ref 9–23)
BUN: 12 mg/dL (ref 6–24)
Bilirubin Total: 0.3 mg/dL (ref 0.0–1.2)
CO2: 29 mmol/L (ref 20–29)
Calcium: 9.3 mg/dL (ref 8.7–10.2)
Chloride: 101 mmol/L (ref 96–106)
Creatinine, Ser: 0.8 mg/dL (ref 0.57–1.00)
Globulin, Total: 2.9 g/dL (ref 1.5–4.5)
Glucose: 100 mg/dL — ABNORMAL HIGH (ref 70–99)
Potassium: 3.8 mmol/L (ref 3.5–5.2)
Sodium: 141 mmol/L (ref 134–144)
Total Protein: 7.2 g/dL (ref 6.0–8.5)
eGFR: 91 mL/min/{1.73_m2} (ref >59)

## 2021-09-10 ENCOUNTER — Encounter: Payer: Self-pay | Admitting: Nurse Practitioner

## 2021-09-11 DIAGNOSIS — E785 Hyperlipidemia, unspecified: Secondary | ICD-10-CM | POA: Diagnosis not present

## 2021-09-12 DIAGNOSIS — E785 Hyperlipidemia, unspecified: Secondary | ICD-10-CM | POA: Diagnosis not present

## 2021-09-13 DIAGNOSIS — E785 Hyperlipidemia, unspecified: Secondary | ICD-10-CM | POA: Diagnosis not present

## 2021-09-14 ENCOUNTER — Other Ambulatory Visit: Payer: Self-pay | Admitting: Nurse Practitioner

## 2021-09-14 DIAGNOSIS — E785 Hyperlipidemia, unspecified: Secondary | ICD-10-CM | POA: Diagnosis not present

## 2021-09-14 DIAGNOSIS — D241 Benign neoplasm of right breast: Secondary | ICD-10-CM

## 2021-09-15 DIAGNOSIS — E785 Hyperlipidemia, unspecified: Secondary | ICD-10-CM | POA: Diagnosis not present

## 2021-09-18 DIAGNOSIS — E785 Hyperlipidemia, unspecified: Secondary | ICD-10-CM | POA: Diagnosis not present

## 2021-09-19 DIAGNOSIS — E785 Hyperlipidemia, unspecified: Secondary | ICD-10-CM | POA: Diagnosis not present

## 2021-09-20 DIAGNOSIS — E785 Hyperlipidemia, unspecified: Secondary | ICD-10-CM | POA: Diagnosis not present

## 2021-09-21 DIAGNOSIS — E785 Hyperlipidemia, unspecified: Secondary | ICD-10-CM | POA: Diagnosis not present

## 2021-09-22 ENCOUNTER — Other Ambulatory Visit: Payer: Self-pay

## 2021-09-22 DIAGNOSIS — E785 Hyperlipidemia, unspecified: Secondary | ICD-10-CM | POA: Diagnosis not present

## 2021-09-23 DIAGNOSIS — D869 Sarcoidosis, unspecified: Secondary | ICD-10-CM | POA: Diagnosis not present

## 2021-09-23 DIAGNOSIS — J189 Pneumonia, unspecified organism: Secondary | ICD-10-CM | POA: Diagnosis not present

## 2021-09-23 DIAGNOSIS — J45909 Unspecified asthma, uncomplicated: Secondary | ICD-10-CM | POA: Diagnosis not present

## 2021-09-25 DIAGNOSIS — E785 Hyperlipidemia, unspecified: Secondary | ICD-10-CM | POA: Diagnosis not present

## 2021-09-26 ENCOUNTER — Encounter: Payer: Self-pay | Admitting: *Deleted

## 2021-09-26 DIAGNOSIS — E785 Hyperlipidemia, unspecified: Secondary | ICD-10-CM | POA: Diagnosis not present

## 2021-09-27 DIAGNOSIS — E785 Hyperlipidemia, unspecified: Secondary | ICD-10-CM | POA: Diagnosis not present

## 2021-09-28 DIAGNOSIS — E785 Hyperlipidemia, unspecified: Secondary | ICD-10-CM | POA: Diagnosis not present

## 2021-09-29 DIAGNOSIS — J849 Interstitial pulmonary disease, unspecified: Secondary | ICD-10-CM | POA: Diagnosis not present

## 2021-09-29 DIAGNOSIS — D869 Sarcoidosis, unspecified: Secondary | ICD-10-CM | POA: Diagnosis not present

## 2021-09-29 DIAGNOSIS — J439 Emphysema, unspecified: Secondary | ICD-10-CM | POA: Diagnosis not present

## 2021-09-29 DIAGNOSIS — E785 Hyperlipidemia, unspecified: Secondary | ICD-10-CM | POA: Diagnosis not present

## 2021-10-02 DIAGNOSIS — E785 Hyperlipidemia, unspecified: Secondary | ICD-10-CM | POA: Diagnosis not present

## 2021-10-03 DIAGNOSIS — E785 Hyperlipidemia, unspecified: Secondary | ICD-10-CM | POA: Diagnosis not present

## 2021-10-04 DIAGNOSIS — E785 Hyperlipidemia, unspecified: Secondary | ICD-10-CM | POA: Diagnosis not present

## 2021-10-05 DIAGNOSIS — E785 Hyperlipidemia, unspecified: Secondary | ICD-10-CM | POA: Diagnosis not present

## 2021-10-05 DIAGNOSIS — R32 Unspecified urinary incontinence: Secondary | ICD-10-CM | POA: Diagnosis not present

## 2021-10-06 DIAGNOSIS — E785 Hyperlipidemia, unspecified: Secondary | ICD-10-CM | POA: Diagnosis not present

## 2021-10-09 DIAGNOSIS — E785 Hyperlipidemia, unspecified: Secondary | ICD-10-CM | POA: Diagnosis not present

## 2021-10-10 DIAGNOSIS — E785 Hyperlipidemia, unspecified: Secondary | ICD-10-CM | POA: Diagnosis not present

## 2021-10-11 DIAGNOSIS — E785 Hyperlipidemia, unspecified: Secondary | ICD-10-CM | POA: Diagnosis not present

## 2021-10-12 DIAGNOSIS — E785 Hyperlipidemia, unspecified: Secondary | ICD-10-CM | POA: Diagnosis not present

## 2021-10-13 DIAGNOSIS — E785 Hyperlipidemia, unspecified: Secondary | ICD-10-CM | POA: Diagnosis not present

## 2021-10-16 DIAGNOSIS — E785 Hyperlipidemia, unspecified: Secondary | ICD-10-CM | POA: Diagnosis not present

## 2021-10-17 DIAGNOSIS — E785 Hyperlipidemia, unspecified: Secondary | ICD-10-CM | POA: Diagnosis not present

## 2021-10-18 DIAGNOSIS — E785 Hyperlipidemia, unspecified: Secondary | ICD-10-CM | POA: Diagnosis not present

## 2021-10-19 DIAGNOSIS — E785 Hyperlipidemia, unspecified: Secondary | ICD-10-CM | POA: Diagnosis not present

## 2021-10-20 DIAGNOSIS — E785 Hyperlipidemia, unspecified: Secondary | ICD-10-CM | POA: Diagnosis not present

## 2021-10-23 DIAGNOSIS — E785 Hyperlipidemia, unspecified: Secondary | ICD-10-CM | POA: Diagnosis not present

## 2021-10-24 DIAGNOSIS — E785 Hyperlipidemia, unspecified: Secondary | ICD-10-CM | POA: Diagnosis not present

## 2021-10-24 DIAGNOSIS — J189 Pneumonia, unspecified organism: Secondary | ICD-10-CM | POA: Diagnosis not present

## 2021-10-24 DIAGNOSIS — D869 Sarcoidosis, unspecified: Secondary | ICD-10-CM | POA: Diagnosis not present

## 2021-10-24 DIAGNOSIS — J45909 Unspecified asthma, uncomplicated: Secondary | ICD-10-CM | POA: Diagnosis not present

## 2021-10-25 DIAGNOSIS — E785 Hyperlipidemia, unspecified: Secondary | ICD-10-CM | POA: Diagnosis not present

## 2021-10-26 DIAGNOSIS — E785 Hyperlipidemia, unspecified: Secondary | ICD-10-CM | POA: Diagnosis not present

## 2021-10-27 DIAGNOSIS — E785 Hyperlipidemia, unspecified: Secondary | ICD-10-CM | POA: Diagnosis not present

## 2021-10-29 DIAGNOSIS — J849 Interstitial pulmonary disease, unspecified: Secondary | ICD-10-CM | POA: Diagnosis not present

## 2021-10-29 DIAGNOSIS — J439 Emphysema, unspecified: Secondary | ICD-10-CM | POA: Diagnosis not present

## 2021-10-29 DIAGNOSIS — D869 Sarcoidosis, unspecified: Secondary | ICD-10-CM | POA: Diagnosis not present

## 2021-10-30 DIAGNOSIS — E785 Hyperlipidemia, unspecified: Secondary | ICD-10-CM | POA: Diagnosis not present

## 2021-10-31 DIAGNOSIS — E785 Hyperlipidemia, unspecified: Secondary | ICD-10-CM | POA: Diagnosis not present

## 2021-11-01 DIAGNOSIS — E785 Hyperlipidemia, unspecified: Secondary | ICD-10-CM | POA: Diagnosis not present

## 2021-11-02 DIAGNOSIS — E785 Hyperlipidemia, unspecified: Secondary | ICD-10-CM | POA: Diagnosis not present

## 2021-11-03 DIAGNOSIS — E785 Hyperlipidemia, unspecified: Secondary | ICD-10-CM | POA: Diagnosis not present

## 2021-11-06 DIAGNOSIS — E785 Hyperlipidemia, unspecified: Secondary | ICD-10-CM | POA: Diagnosis not present

## 2021-11-06 DIAGNOSIS — R32 Unspecified urinary incontinence: Secondary | ICD-10-CM | POA: Diagnosis not present

## 2021-11-07 DIAGNOSIS — E785 Hyperlipidemia, unspecified: Secondary | ICD-10-CM | POA: Diagnosis not present

## 2021-11-08 DIAGNOSIS — J45909 Unspecified asthma, uncomplicated: Secondary | ICD-10-CM | POA: Diagnosis not present

## 2021-11-08 DIAGNOSIS — J189 Pneumonia, unspecified organism: Secondary | ICD-10-CM | POA: Diagnosis not present

## 2021-11-08 DIAGNOSIS — E785 Hyperlipidemia, unspecified: Secondary | ICD-10-CM | POA: Diagnosis not present

## 2021-11-08 DIAGNOSIS — D869 Sarcoidosis, unspecified: Secondary | ICD-10-CM | POA: Diagnosis not present

## 2021-11-09 DIAGNOSIS — E785 Hyperlipidemia, unspecified: Secondary | ICD-10-CM | POA: Diagnosis not present

## 2021-11-10 DIAGNOSIS — E785 Hyperlipidemia, unspecified: Secondary | ICD-10-CM | POA: Diagnosis not present

## 2021-11-13 DIAGNOSIS — E785 Hyperlipidemia, unspecified: Secondary | ICD-10-CM | POA: Diagnosis not present

## 2021-11-14 DIAGNOSIS — E785 Hyperlipidemia, unspecified: Secondary | ICD-10-CM | POA: Diagnosis not present

## 2021-11-15 DIAGNOSIS — E785 Hyperlipidemia, unspecified: Secondary | ICD-10-CM | POA: Diagnosis not present

## 2021-11-16 DIAGNOSIS — E785 Hyperlipidemia, unspecified: Secondary | ICD-10-CM | POA: Diagnosis not present

## 2021-11-17 DIAGNOSIS — E785 Hyperlipidemia, unspecified: Secondary | ICD-10-CM | POA: Diagnosis not present

## 2021-11-20 DIAGNOSIS — E785 Hyperlipidemia, unspecified: Secondary | ICD-10-CM | POA: Diagnosis not present

## 2021-11-21 DIAGNOSIS — E785 Hyperlipidemia, unspecified: Secondary | ICD-10-CM | POA: Diagnosis not present

## 2021-11-22 DIAGNOSIS — E785 Hyperlipidemia, unspecified: Secondary | ICD-10-CM | POA: Diagnosis not present

## 2021-11-23 DIAGNOSIS — J189 Pneumonia, unspecified organism: Secondary | ICD-10-CM | POA: Diagnosis not present

## 2021-11-23 DIAGNOSIS — J45909 Unspecified asthma, uncomplicated: Secondary | ICD-10-CM | POA: Diagnosis not present

## 2021-11-23 DIAGNOSIS — E785 Hyperlipidemia, unspecified: Secondary | ICD-10-CM | POA: Diagnosis not present

## 2021-11-23 DIAGNOSIS — D869 Sarcoidosis, unspecified: Secondary | ICD-10-CM | POA: Diagnosis not present

## 2021-11-24 DIAGNOSIS — E785 Hyperlipidemia, unspecified: Secondary | ICD-10-CM | POA: Diagnosis not present

## 2021-11-27 DIAGNOSIS — E785 Hyperlipidemia, unspecified: Secondary | ICD-10-CM | POA: Diagnosis not present

## 2021-11-28 DIAGNOSIS — E785 Hyperlipidemia, unspecified: Secondary | ICD-10-CM | POA: Diagnosis not present

## 2021-11-29 ENCOUNTER — Ambulatory Visit: Payer: Medicaid Other | Admitting: Nurse Practitioner

## 2021-11-29 DIAGNOSIS — E785 Hyperlipidemia, unspecified: Secondary | ICD-10-CM | POA: Diagnosis not present

## 2021-11-29 DIAGNOSIS — D869 Sarcoidosis, unspecified: Secondary | ICD-10-CM | POA: Diagnosis not present

## 2021-11-29 DIAGNOSIS — J439 Emphysema, unspecified: Secondary | ICD-10-CM | POA: Diagnosis not present

## 2021-11-29 DIAGNOSIS — J849 Interstitial pulmonary disease, unspecified: Secondary | ICD-10-CM | POA: Diagnosis not present

## 2021-11-30 DIAGNOSIS — E785 Hyperlipidemia, unspecified: Secondary | ICD-10-CM | POA: Diagnosis not present

## 2021-12-01 DIAGNOSIS — E785 Hyperlipidemia, unspecified: Secondary | ICD-10-CM | POA: Diagnosis not present

## 2021-12-05 DIAGNOSIS — E785 Hyperlipidemia, unspecified: Secondary | ICD-10-CM | POA: Diagnosis not present

## 2021-12-06 DIAGNOSIS — E785 Hyperlipidemia, unspecified: Secondary | ICD-10-CM | POA: Diagnosis not present

## 2021-12-07 DIAGNOSIS — E785 Hyperlipidemia, unspecified: Secondary | ICD-10-CM | POA: Diagnosis not present

## 2021-12-07 DIAGNOSIS — R32 Unspecified urinary incontinence: Secondary | ICD-10-CM | POA: Diagnosis not present

## 2021-12-08 DIAGNOSIS — E785 Hyperlipidemia, unspecified: Secondary | ICD-10-CM | POA: Diagnosis not present

## 2021-12-11 DIAGNOSIS — E785 Hyperlipidemia, unspecified: Secondary | ICD-10-CM | POA: Diagnosis not present

## 2021-12-12 DIAGNOSIS — E785 Hyperlipidemia, unspecified: Secondary | ICD-10-CM | POA: Diagnosis not present

## 2021-12-13 DIAGNOSIS — E785 Hyperlipidemia, unspecified: Secondary | ICD-10-CM | POA: Diagnosis not present

## 2021-12-14 DIAGNOSIS — E785 Hyperlipidemia, unspecified: Secondary | ICD-10-CM | POA: Diagnosis not present

## 2021-12-15 DIAGNOSIS — E785 Hyperlipidemia, unspecified: Secondary | ICD-10-CM | POA: Diagnosis not present

## 2021-12-18 DIAGNOSIS — E785 Hyperlipidemia, unspecified: Secondary | ICD-10-CM | POA: Diagnosis not present

## 2021-12-19 DIAGNOSIS — E785 Hyperlipidemia, unspecified: Secondary | ICD-10-CM | POA: Diagnosis not present

## 2021-12-20 DIAGNOSIS — E785 Hyperlipidemia, unspecified: Secondary | ICD-10-CM | POA: Diagnosis not present

## 2021-12-21 DIAGNOSIS — E785 Hyperlipidemia, unspecified: Secondary | ICD-10-CM | POA: Diagnosis not present

## 2021-12-22 DIAGNOSIS — E785 Hyperlipidemia, unspecified: Secondary | ICD-10-CM | POA: Diagnosis not present

## 2021-12-24 DIAGNOSIS — J45909 Unspecified asthma, uncomplicated: Secondary | ICD-10-CM | POA: Diagnosis not present

## 2021-12-24 DIAGNOSIS — D869 Sarcoidosis, unspecified: Secondary | ICD-10-CM | POA: Diagnosis not present

## 2021-12-24 DIAGNOSIS — J189 Pneumonia, unspecified organism: Secondary | ICD-10-CM | POA: Diagnosis not present

## 2021-12-25 DIAGNOSIS — E785 Hyperlipidemia, unspecified: Secondary | ICD-10-CM | POA: Diagnosis not present

## 2021-12-26 DIAGNOSIS — E785 Hyperlipidemia, unspecified: Secondary | ICD-10-CM | POA: Diagnosis not present

## 2021-12-27 ENCOUNTER — Other Ambulatory Visit: Payer: Self-pay | Admitting: Nurse Practitioner

## 2021-12-27 ENCOUNTER — Other Ambulatory Visit: Payer: Self-pay

## 2021-12-27 ENCOUNTER — Other Ambulatory Visit: Payer: Self-pay | Admitting: Pulmonary Disease

## 2021-12-27 DIAGNOSIS — E785 Hyperlipidemia, unspecified: Secondary | ICD-10-CM | POA: Diagnosis not present

## 2021-12-27 MED ORDER — LISINOPRIL 40 MG PO TABS
ORAL_TABLET | Freq: Every day | ORAL | 0 refills | Status: DC
  Filled 2021-12-27: qty 30, 30d supply, fill #0

## 2021-12-27 MED ORDER — ALBUTEROL SULFATE (2.5 MG/3ML) 0.083% IN NEBU
3.0000 mL | INHALATION_SOLUTION | Freq: Four times a day (QID) | RESPIRATORY_TRACT | 0 refills | Status: DC | PRN
Start: 2021-12-27 — End: 2022-07-24
  Filled 2021-12-27: qty 150, 13d supply, fill #0

## 2021-12-28 ENCOUNTER — Other Ambulatory Visit: Payer: Self-pay

## 2021-12-28 DIAGNOSIS — E785 Hyperlipidemia, unspecified: Secondary | ICD-10-CM | POA: Diagnosis not present

## 2021-12-29 DIAGNOSIS — J439 Emphysema, unspecified: Secondary | ICD-10-CM | POA: Diagnosis not present

## 2021-12-29 DIAGNOSIS — E785 Hyperlipidemia, unspecified: Secondary | ICD-10-CM | POA: Diagnosis not present

## 2021-12-29 DIAGNOSIS — D869 Sarcoidosis, unspecified: Secondary | ICD-10-CM | POA: Diagnosis not present

## 2021-12-29 DIAGNOSIS — J849 Interstitial pulmonary disease, unspecified: Secondary | ICD-10-CM | POA: Diagnosis not present

## 2022-01-01 DIAGNOSIS — E785 Hyperlipidemia, unspecified: Secondary | ICD-10-CM | POA: Diagnosis not present

## 2022-01-02 DIAGNOSIS — E785 Hyperlipidemia, unspecified: Secondary | ICD-10-CM | POA: Diagnosis not present

## 2022-01-03 DIAGNOSIS — E785 Hyperlipidemia, unspecified: Secondary | ICD-10-CM | POA: Diagnosis not present

## 2022-01-04 DIAGNOSIS — E785 Hyperlipidemia, unspecified: Secondary | ICD-10-CM | POA: Diagnosis not present

## 2022-01-05 DIAGNOSIS — E785 Hyperlipidemia, unspecified: Secondary | ICD-10-CM | POA: Diagnosis not present

## 2022-01-07 DIAGNOSIS — R32 Unspecified urinary incontinence: Secondary | ICD-10-CM | POA: Diagnosis not present

## 2022-01-08 DIAGNOSIS — E785 Hyperlipidemia, unspecified: Secondary | ICD-10-CM | POA: Diagnosis not present

## 2022-01-09 DIAGNOSIS — E785 Hyperlipidemia, unspecified: Secondary | ICD-10-CM | POA: Diagnosis not present

## 2022-01-10 DIAGNOSIS — E785 Hyperlipidemia, unspecified: Secondary | ICD-10-CM | POA: Diagnosis not present

## 2022-01-11 DIAGNOSIS — E785 Hyperlipidemia, unspecified: Secondary | ICD-10-CM | POA: Diagnosis not present

## 2022-01-12 DIAGNOSIS — E785 Hyperlipidemia, unspecified: Secondary | ICD-10-CM | POA: Diagnosis not present

## 2022-01-15 DIAGNOSIS — E785 Hyperlipidemia, unspecified: Secondary | ICD-10-CM | POA: Diagnosis not present

## 2022-01-16 DIAGNOSIS — E785 Hyperlipidemia, unspecified: Secondary | ICD-10-CM | POA: Diagnosis not present

## 2022-01-17 DIAGNOSIS — E785 Hyperlipidemia, unspecified: Secondary | ICD-10-CM | POA: Diagnosis not present

## 2022-01-18 DIAGNOSIS — E785 Hyperlipidemia, unspecified: Secondary | ICD-10-CM | POA: Diagnosis not present

## 2022-01-19 DIAGNOSIS — E785 Hyperlipidemia, unspecified: Secondary | ICD-10-CM | POA: Diagnosis not present

## 2022-01-22 DIAGNOSIS — E785 Hyperlipidemia, unspecified: Secondary | ICD-10-CM | POA: Diagnosis not present

## 2022-01-23 DIAGNOSIS — J45909 Unspecified asthma, uncomplicated: Secondary | ICD-10-CM | POA: Diagnosis not present

## 2022-01-23 DIAGNOSIS — D869 Sarcoidosis, unspecified: Secondary | ICD-10-CM | POA: Diagnosis not present

## 2022-01-23 DIAGNOSIS — E785 Hyperlipidemia, unspecified: Secondary | ICD-10-CM | POA: Diagnosis not present

## 2022-01-23 DIAGNOSIS — J189 Pneumonia, unspecified organism: Secondary | ICD-10-CM | POA: Diagnosis not present

## 2022-01-24 DIAGNOSIS — E785 Hyperlipidemia, unspecified: Secondary | ICD-10-CM | POA: Diagnosis not present

## 2022-01-25 DIAGNOSIS — E785 Hyperlipidemia, unspecified: Secondary | ICD-10-CM | POA: Diagnosis not present

## 2022-01-26 DIAGNOSIS — E785 Hyperlipidemia, unspecified: Secondary | ICD-10-CM | POA: Diagnosis not present

## 2022-01-29 DIAGNOSIS — J849 Interstitial pulmonary disease, unspecified: Secondary | ICD-10-CM | POA: Diagnosis not present

## 2022-01-29 DIAGNOSIS — D869 Sarcoidosis, unspecified: Secondary | ICD-10-CM | POA: Diagnosis not present

## 2022-01-29 DIAGNOSIS — J439 Emphysema, unspecified: Secondary | ICD-10-CM | POA: Diagnosis not present

## 2022-01-29 DIAGNOSIS — E785 Hyperlipidemia, unspecified: Secondary | ICD-10-CM | POA: Diagnosis not present

## 2022-01-30 DIAGNOSIS — E785 Hyperlipidemia, unspecified: Secondary | ICD-10-CM | POA: Diagnosis not present

## 2022-01-31 DIAGNOSIS — E785 Hyperlipidemia, unspecified: Secondary | ICD-10-CM | POA: Diagnosis not present

## 2022-02-01 DIAGNOSIS — E785 Hyperlipidemia, unspecified: Secondary | ICD-10-CM | POA: Diagnosis not present

## 2022-02-02 DIAGNOSIS — E785 Hyperlipidemia, unspecified: Secondary | ICD-10-CM | POA: Diagnosis not present

## 2022-02-05 DIAGNOSIS — E785 Hyperlipidemia, unspecified: Secondary | ICD-10-CM | POA: Diagnosis not present

## 2022-02-06 DIAGNOSIS — E785 Hyperlipidemia, unspecified: Secondary | ICD-10-CM | POA: Diagnosis not present

## 2022-02-07 DIAGNOSIS — E785 Hyperlipidemia, unspecified: Secondary | ICD-10-CM | POA: Diagnosis not present

## 2022-02-08 DIAGNOSIS — E785 Hyperlipidemia, unspecified: Secondary | ICD-10-CM | POA: Diagnosis not present

## 2022-02-08 DIAGNOSIS — R32 Unspecified urinary incontinence: Secondary | ICD-10-CM | POA: Diagnosis not present

## 2022-02-09 DIAGNOSIS — E785 Hyperlipidemia, unspecified: Secondary | ICD-10-CM | POA: Diagnosis not present

## 2022-02-12 DIAGNOSIS — E785 Hyperlipidemia, unspecified: Secondary | ICD-10-CM | POA: Diagnosis not present

## 2022-02-13 DIAGNOSIS — E785 Hyperlipidemia, unspecified: Secondary | ICD-10-CM | POA: Diagnosis not present

## 2022-02-14 DIAGNOSIS — E785 Hyperlipidemia, unspecified: Secondary | ICD-10-CM | POA: Diagnosis not present

## 2022-02-15 DIAGNOSIS — E785 Hyperlipidemia, unspecified: Secondary | ICD-10-CM | POA: Diagnosis not present

## 2022-02-16 ENCOUNTER — Other Ambulatory Visit (HOSPITAL_COMMUNITY): Payer: Self-pay

## 2022-02-16 DIAGNOSIS — E785 Hyperlipidemia, unspecified: Secondary | ICD-10-CM | POA: Diagnosis not present

## 2022-02-19 DIAGNOSIS — E785 Hyperlipidemia, unspecified: Secondary | ICD-10-CM | POA: Diagnosis not present

## 2022-02-20 DIAGNOSIS — E785 Hyperlipidemia, unspecified: Secondary | ICD-10-CM | POA: Diagnosis not present

## 2022-02-21 DIAGNOSIS — E785 Hyperlipidemia, unspecified: Secondary | ICD-10-CM | POA: Diagnosis not present

## 2022-02-22 DIAGNOSIS — E785 Hyperlipidemia, unspecified: Secondary | ICD-10-CM | POA: Diagnosis not present

## 2022-02-23 DIAGNOSIS — D869 Sarcoidosis, unspecified: Secondary | ICD-10-CM | POA: Diagnosis not present

## 2022-02-23 DIAGNOSIS — J45909 Unspecified asthma, uncomplicated: Secondary | ICD-10-CM | POA: Diagnosis not present

## 2022-02-23 DIAGNOSIS — J189 Pneumonia, unspecified organism: Secondary | ICD-10-CM | POA: Diagnosis not present

## 2022-02-23 DIAGNOSIS — E785 Hyperlipidemia, unspecified: Secondary | ICD-10-CM | POA: Diagnosis not present

## 2022-02-26 DIAGNOSIS — E785 Hyperlipidemia, unspecified: Secondary | ICD-10-CM | POA: Diagnosis not present

## 2022-02-27 DIAGNOSIS — E785 Hyperlipidemia, unspecified: Secondary | ICD-10-CM | POA: Diagnosis not present

## 2022-02-28 DIAGNOSIS — E785 Hyperlipidemia, unspecified: Secondary | ICD-10-CM | POA: Diagnosis not present

## 2022-03-01 DIAGNOSIS — E785 Hyperlipidemia, unspecified: Secondary | ICD-10-CM | POA: Diagnosis not present

## 2022-03-01 DIAGNOSIS — J439 Emphysema, unspecified: Secondary | ICD-10-CM | POA: Diagnosis not present

## 2022-03-01 DIAGNOSIS — D869 Sarcoidosis, unspecified: Secondary | ICD-10-CM | POA: Diagnosis not present

## 2022-03-01 DIAGNOSIS — J849 Interstitial pulmonary disease, unspecified: Secondary | ICD-10-CM | POA: Diagnosis not present

## 2022-03-02 DIAGNOSIS — E785 Hyperlipidemia, unspecified: Secondary | ICD-10-CM | POA: Diagnosis not present

## 2022-03-02 IMAGING — DX DG CHEST 2V
2 series · 2 of 2 positions shown · non-contrast
Comparison: November 27, 2019

CLINICAL DATA: Shortness of breath.

EXAM:
CHEST - 2 VIEW

[chest pa]
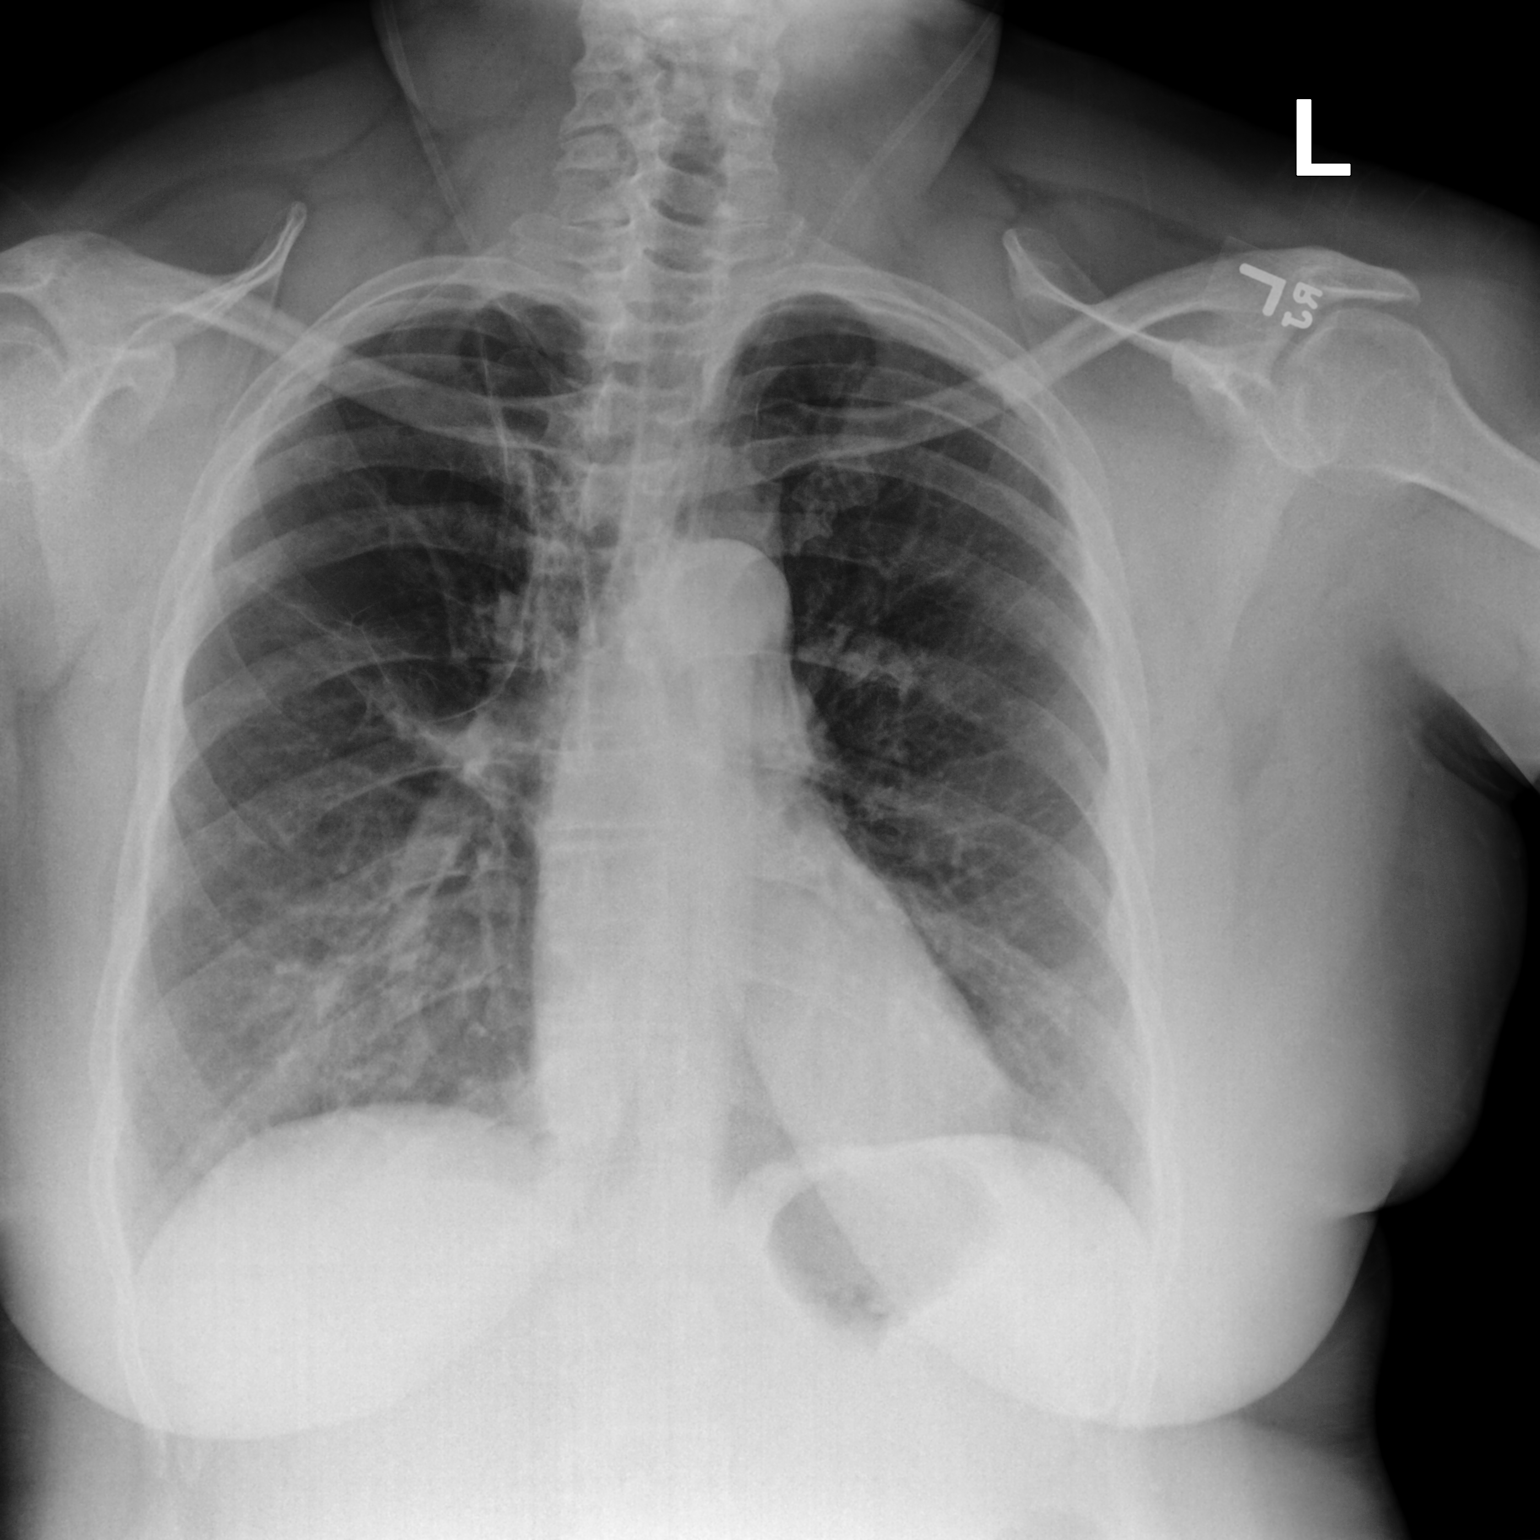

[chest lat]
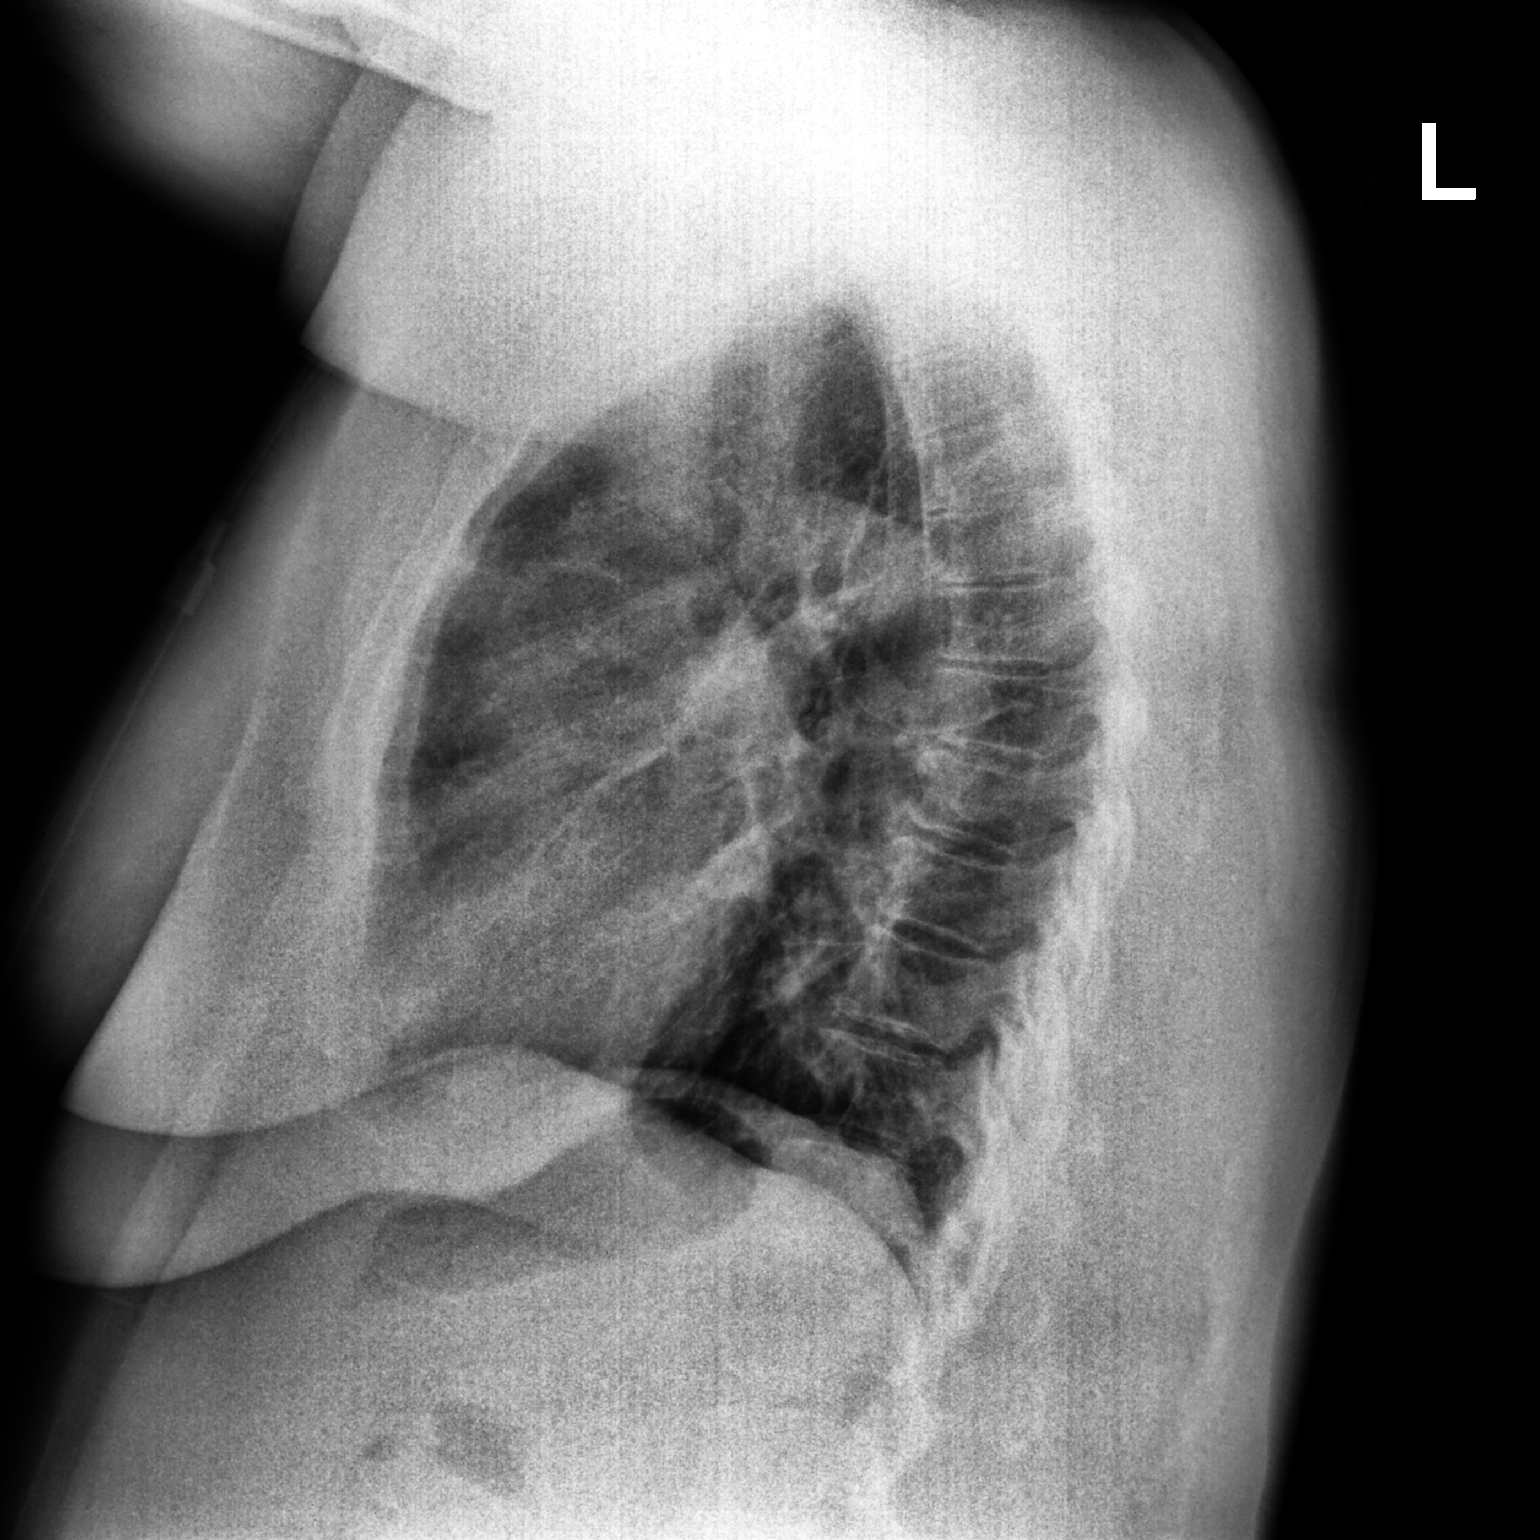

[2 of 2 positions shown; findings below may reference images not displayed]

FINDINGS: Bullous changes are noted in the right upper lobe, stable. There is
mild associated scarring in the right upper lobe. The lungs
elsewhere are clear. Heart size and pulmonary vascularity within
normal limits. No adenopathy. No bone lesions.
IMPRESSION: Bullous disease and scarring right upper lobe. No edema or airspace
opacity. Cardiac silhouette within normal limits.

## 2022-03-05 ENCOUNTER — Telehealth: Payer: Self-pay | Admitting: Emergency Medicine

## 2022-03-05 DIAGNOSIS — E785 Hyperlipidemia, unspecified: Secondary | ICD-10-CM | POA: Diagnosis not present

## 2022-03-05 NOTE — Telephone Encounter (Signed)
Copied from [place] [id number]. Topic: General - Other >> Mar 05, 2022 11:14 AM Everette C wrote: Reason for CRM: The patient has called to request additional information related to their previous diagnoses   The patient would ike to know if they have been previously diagnosed with obesity or diabetes   The patient would like confirmation of either diagnoses submitted to [facility]   Please fax information to [phone] Attn Amedeo Plenty   The patient is interested in beginning weight loss medication and more information about a potential prescription   The patient would like to discuss the medication prior to requesting a prescription

## 2022-03-05 NOTE — Telephone Encounter (Signed)
Patient informed that an office visit would need to be scheduled to discuss this concern. Appt scheduled.

## 2022-03-06 DIAGNOSIS — E785 Hyperlipidemia, unspecified: Secondary | ICD-10-CM | POA: Diagnosis not present

## 2022-03-07 DIAGNOSIS — E785 Hyperlipidemia, unspecified: Secondary | ICD-10-CM | POA: Diagnosis not present

## 2022-03-08 DIAGNOSIS — E785 Hyperlipidemia, unspecified: Secondary | ICD-10-CM | POA: Diagnosis not present

## 2022-03-09 DIAGNOSIS — E785 Hyperlipidemia, unspecified: Secondary | ICD-10-CM | POA: Diagnosis not present

## 2022-03-11 DIAGNOSIS — R32 Unspecified urinary incontinence: Secondary | ICD-10-CM | POA: Diagnosis not present

## 2022-03-12 DIAGNOSIS — E785 Hyperlipidemia, unspecified: Secondary | ICD-10-CM | POA: Diagnosis not present

## 2022-03-13 ENCOUNTER — Other Ambulatory Visit: Payer: Self-pay

## 2022-03-13 DIAGNOSIS — E785 Hyperlipidemia, unspecified: Secondary | ICD-10-CM | POA: Diagnosis not present

## 2022-03-13 NOTE — Progress Notes (Signed)
Patient attempted to be outreached by Levi Aland, PharmD Candidate on 03/12/22 to discuss hypertension. Left voicemail for patient to return our call at their convenience at [phone].   Levi Aland, PharmD Candidate, Class of [phone]  [place], [place].D. PGY-2 Ambulatory Care Pharmacy Resident 03/13/2022 9:35 AM

## 2022-03-14 DIAGNOSIS — E785 Hyperlipidemia, unspecified: Secondary | ICD-10-CM | POA: Diagnosis not present

## 2022-03-15 DIAGNOSIS — E785 Hyperlipidemia, unspecified: Secondary | ICD-10-CM | POA: Diagnosis not present

## 2022-03-16 DIAGNOSIS — E785 Hyperlipidemia, unspecified: Secondary | ICD-10-CM | POA: Diagnosis not present

## 2022-03-19 ENCOUNTER — Telehealth: Payer: Self-pay

## 2022-03-19 NOTE — Telephone Encounter (Signed)
Patient attempted to be outreached by Levi Aland, PharmD Candidate on 03/19/22 to discuss hypertension. Left voicemail for patient to return our call at their convenience at [phone].   Levi Aland, PharmD Candidate, Class of [phone]  [place], [place].D. PGY-2 Ambulatory Care Pharmacy Resident 03/19/2022 2:22 PM

## 2022-03-20 DIAGNOSIS — E785 Hyperlipidemia, unspecified: Secondary | ICD-10-CM | POA: Diagnosis not present

## 2022-03-21 DIAGNOSIS — E785 Hyperlipidemia, unspecified: Secondary | ICD-10-CM | POA: Diagnosis not present

## 2022-03-23 DIAGNOSIS — E785 Hyperlipidemia, unspecified: Secondary | ICD-10-CM | POA: Diagnosis not present

## 2022-03-26 DIAGNOSIS — J9611 Chronic respiratory failure with hypoxia: Secondary | ICD-10-CM | POA: Diagnosis not present

## 2022-03-26 DIAGNOSIS — D869 Sarcoidosis, unspecified: Secondary | ICD-10-CM | POA: Diagnosis not present

## 2022-03-26 DIAGNOSIS — J45909 Unspecified asthma, uncomplicated: Secondary | ICD-10-CM | POA: Diagnosis not present

## 2022-03-26 DIAGNOSIS — J189 Pneumonia, unspecified organism: Secondary | ICD-10-CM | POA: Diagnosis not present

## 2022-03-26 DIAGNOSIS — E785 Hyperlipidemia, unspecified: Secondary | ICD-10-CM | POA: Diagnosis not present

## 2022-03-26 NOTE — Telephone Encounter (Signed)
Patient attempted to be outreached by Levi Aland, PharmD Candidate on 03/26/22 to discuss hypertension. Left voicemail for patient to return our call at their convenience at [phone].   Levi Aland, PharmD Candidate, Class of 2024  [place]. [place], PharmD PGY-1 [facility] Resident

## 2022-03-27 DIAGNOSIS — E785 Hyperlipidemia, unspecified: Secondary | ICD-10-CM | POA: Diagnosis not present

## 2022-03-28 DIAGNOSIS — E785 Hyperlipidemia, unspecified: Secondary | ICD-10-CM | POA: Diagnosis not present

## 2022-03-30 DIAGNOSIS — J439 Emphysema, unspecified: Secondary | ICD-10-CM | POA: Diagnosis not present

## 2022-03-30 DIAGNOSIS — J849 Interstitial pulmonary disease, unspecified: Secondary | ICD-10-CM | POA: Diagnosis not present

## 2022-03-30 DIAGNOSIS — D869 Sarcoidosis, unspecified: Secondary | ICD-10-CM | POA: Diagnosis not present

## 2022-04-02 ENCOUNTER — Ambulatory Visit: Payer: Medicaid Other | Admitting: Nurse Practitioner

## 2022-04-02 DIAGNOSIS — E785 Hyperlipidemia, unspecified: Secondary | ICD-10-CM | POA: Diagnosis not present

## 2022-04-03 DIAGNOSIS — E785 Hyperlipidemia, unspecified: Secondary | ICD-10-CM | POA: Diagnosis not present

## 2022-04-04 DIAGNOSIS — E785 Hyperlipidemia, unspecified: Secondary | ICD-10-CM | POA: Diagnosis not present

## 2022-04-05 DIAGNOSIS — E785 Hyperlipidemia, unspecified: Secondary | ICD-10-CM | POA: Diagnosis not present

## 2022-04-06 DIAGNOSIS — E785 Hyperlipidemia, unspecified: Secondary | ICD-10-CM | POA: Diagnosis not present

## 2022-04-09 DIAGNOSIS — E785 Hyperlipidemia, unspecified: Secondary | ICD-10-CM | POA: Diagnosis not present

## 2022-04-10 ENCOUNTER — Telehealth: Payer: Self-pay | Admitting: Nurse Practitioner

## 2022-04-10 ENCOUNTER — Encounter: Payer: Self-pay | Admitting: Nurse Practitioner

## 2022-04-10 ENCOUNTER — Encounter: Payer: Medicaid Other | Admitting: Nurse Practitioner

## 2022-04-10 DIAGNOSIS — E785 Hyperlipidemia, unspecified: Secondary | ICD-10-CM | POA: Diagnosis not present

## 2022-04-10 NOTE — Telephone Encounter (Signed)
Noted  

## 2022-04-10 NOTE — Telephone Encounter (Signed)
Copied from [place] [id number]. Topic: Appointment Scheduling - Scheduling Inquiry for Clinic >> Apr 10, 2022  2:36 PM Sabas Sous wrote: Reason for CRM: Pt called reporting that she has been waiting for her video visit scheduled today at 1:30pm. Says her PCP never joined her on the call. Needs appt asap because [facility] is waiting on this appt for her medication   Best contact: [phone]

## 2022-04-10 NOTE — Progress Notes (Signed)
NEEDS OFFICE VISIT. NOT VIRTUAL

## 2022-04-11 DIAGNOSIS — E785 Hyperlipidemia, unspecified: Secondary | ICD-10-CM | POA: Diagnosis not present

## 2022-04-11 DIAGNOSIS — R32 Unspecified urinary incontinence: Secondary | ICD-10-CM | POA: Diagnosis not present

## 2022-04-12 ENCOUNTER — Telehealth: Payer: Self-pay | Admitting: Emergency Medicine

## 2022-04-12 DIAGNOSIS — E785 Hyperlipidemia, unspecified: Secondary | ICD-10-CM | POA: Diagnosis not present

## 2022-04-12 NOTE — Telephone Encounter (Signed)
Copied from [place] [id number]. Topic: Appointment Scheduling - Scheduling Inquiry for Clinic >> Apr 10, 2022  3:28 PM Sabas Sous wrote: Reason for CRM: Pt called requesting to reschedule the appt that has been cancelled in April. She says she just had a visit with Zelda and does not know how her visit 05/07/2022 was cancelled even though it says it was cancelled via mychart on 04/06/2022.

## 2022-04-13 DIAGNOSIS — E785 Hyperlipidemia, unspecified: Secondary | ICD-10-CM | POA: Diagnosis not present

## 2022-04-16 DIAGNOSIS — E785 Hyperlipidemia, unspecified: Secondary | ICD-10-CM | POA: Diagnosis not present

## 2022-04-17 DIAGNOSIS — E785 Hyperlipidemia, unspecified: Secondary | ICD-10-CM | POA: Diagnosis not present

## 2022-04-18 DIAGNOSIS — E785 Hyperlipidemia, unspecified: Secondary | ICD-10-CM | POA: Diagnosis not present

## 2022-04-19 DIAGNOSIS — E785 Hyperlipidemia, unspecified: Secondary | ICD-10-CM | POA: Diagnosis not present

## 2022-04-24 DIAGNOSIS — E785 Hyperlipidemia, unspecified: Secondary | ICD-10-CM | POA: Diagnosis not present

## 2022-04-24 DIAGNOSIS — J45909 Unspecified asthma, uncomplicated: Secondary | ICD-10-CM | POA: Diagnosis not present

## 2022-04-24 DIAGNOSIS — D869 Sarcoidosis, unspecified: Secondary | ICD-10-CM | POA: Diagnosis not present

## 2022-04-24 DIAGNOSIS — J189 Pneumonia, unspecified organism: Secondary | ICD-10-CM | POA: Diagnosis not present

## 2022-04-25 DIAGNOSIS — E785 Hyperlipidemia, unspecified: Secondary | ICD-10-CM | POA: Diagnosis not present

## 2022-04-26 DIAGNOSIS — E785 Hyperlipidemia, unspecified: Secondary | ICD-10-CM | POA: Diagnosis not present

## 2022-04-30 DIAGNOSIS — J849 Interstitial pulmonary disease, unspecified: Secondary | ICD-10-CM | POA: Diagnosis not present

## 2022-04-30 DIAGNOSIS — E785 Hyperlipidemia, unspecified: Secondary | ICD-10-CM | POA: Diagnosis not present

## 2022-04-30 DIAGNOSIS — D869 Sarcoidosis, unspecified: Secondary | ICD-10-CM | POA: Diagnosis not present

## 2022-04-30 DIAGNOSIS — J439 Emphysema, unspecified: Secondary | ICD-10-CM | POA: Diagnosis not present

## 2022-05-01 DIAGNOSIS — E785 Hyperlipidemia, unspecified: Secondary | ICD-10-CM | POA: Diagnosis not present

## 2022-05-02 DIAGNOSIS — E785 Hyperlipidemia, unspecified: Secondary | ICD-10-CM | POA: Diagnosis not present

## 2022-05-03 DIAGNOSIS — E785 Hyperlipidemia, unspecified: Secondary | ICD-10-CM | POA: Diagnosis not present

## 2022-05-04 ENCOUNTER — Telehealth: Payer: Self-pay | Admitting: Pharmacist

## 2022-05-04 DIAGNOSIS — E785 Hyperlipidemia, unspecified: Secondary | ICD-10-CM | POA: Diagnosis not present

## 2022-05-04 NOTE — Progress Notes (Signed)
Patient attempted to be outreached by Clovis Pu, PharmD Candidate on 4/5 to discuss hypertension. Left voicemail for patient to return our call at their convenience at [phone].   Clovis Pu, PharmD Candidate   Catie Eppie Gibson, PharmD, BCACP, CPP [facility] [phone]

## 2022-05-07 ENCOUNTER — Encounter: Payer: Medicaid Other | Admitting: Nurse Practitioner

## 2022-05-07 ENCOUNTER — Telehealth: Payer: Medicaid Other | Admitting: Nurse Practitioner

## 2022-05-07 DIAGNOSIS — E785 Hyperlipidemia, unspecified: Secondary | ICD-10-CM

## 2022-05-07 DIAGNOSIS — K219 Gastro-esophageal reflux disease without esophagitis: Secondary | ICD-10-CM

## 2022-05-07 DIAGNOSIS — I10 Essential (primary) hypertension: Secondary | ICD-10-CM

## 2022-05-07 MED ORDER — CETIRIZINE HCL 10 MG PO TABS: ORAL_TABLET | Freq: Every day | ORAL | 0 refills | Status: DC

## 2022-05-07 MED ORDER — OMEPRAZOLE 20 MG PO CPDR: 20.0000 mg | DELAYED_RELEASE_CAPSULE | Freq: Every day | ORAL | 0 refills | Status: DC

## 2022-05-07 MED ORDER — HYDROCHLOROTHIAZIDE 25 MG PO TABS: 25.0000 mg | ORAL_TABLET | Freq: Every day | ORAL | 0 refills | Status: DC

## 2022-05-07 MED ORDER — AMLODIPINE BESYLATE 10 MG PO TABS: 10.0000 mg | ORAL_TABLET | Freq: Every day | ORAL | 0 refills | Status: DC

## 2022-05-07 MED ORDER — VENTOLIN HFA 108 (90 BASE) MCG/ACT IN AERS: 1.0000 | INHALATION_SPRAY | Freq: Four times a day (QID) | RESPIRATORY_TRACT | 1 refills | Status: DC | PRN

## 2022-05-07 MED ORDER — CARVEDILOL 6.25 MG PO TABS: 6.2500 mg | ORAL_TABLET | Freq: Two times a day (BID) | ORAL | 0 refills | Status: DC

## 2022-05-07 MED ORDER — MONTELUKAST SODIUM 10 MG PO TABS: 10.0000 mg | ORAL_TABLET | Freq: Every day | ORAL | 0 refills | Status: DC

## 2022-05-07 MED ORDER — POTASSIUM CHLORIDE CRYS ER 20 MEQ PO TBCR: 20.0000 meq | EXTENDED_RELEASE_TABLET | Freq: Every day | ORAL | 0 refills | Status: DC

## 2022-05-07 MED ORDER — ATORVASTATIN CALCIUM 80 MG PO TABS: 80.0000 mg | ORAL_TABLET | Freq: Every day | ORAL | 0 refills | Status: DC

## 2022-05-07 MED ORDER — LISINOPRIL 40 MG PO TABS: 40.0000 mg | ORAL_TABLET | Freq: Every day | ORAL | 0 refills | Status: DC

## 2022-05-07 NOTE — Progress Notes (Signed)
ERRONEOUS ENCOUNTER

## 2022-05-08 DIAGNOSIS — E785 Hyperlipidemia, unspecified: Secondary | ICD-10-CM | POA: Diagnosis not present

## 2022-05-09 DIAGNOSIS — E785 Hyperlipidemia, unspecified: Secondary | ICD-10-CM | POA: Diagnosis not present

## 2022-05-10 DIAGNOSIS — E785 Hyperlipidemia, unspecified: Secondary | ICD-10-CM | POA: Diagnosis not present

## 2022-05-10 DIAGNOSIS — Z1211 Encounter for screening for malignant neoplasm of colon: Secondary | ICD-10-CM | POA: Diagnosis not present

## 2022-05-17 LAB — COLOGUARD: COLOGUARD: NEGATIVE

## 2022-05-25 DIAGNOSIS — J45909 Unspecified asthma, uncomplicated: Secondary | ICD-10-CM | POA: Diagnosis not present

## 2022-05-25 DIAGNOSIS — J189 Pneumonia, unspecified organism: Secondary | ICD-10-CM | POA: Diagnosis not present

## 2022-05-25 DIAGNOSIS — D869 Sarcoidosis, unspecified: Secondary | ICD-10-CM | POA: Diagnosis not present

## 2022-06-11 ENCOUNTER — Telehealth: Payer: Self-pay

## 2022-06-13 ENCOUNTER — Ambulatory Visit: Payer: Medicaid Other | Admitting: Nurse Practitioner

## 2022-06-20 DIAGNOSIS — J45909 Unspecified asthma, uncomplicated: Secondary | ICD-10-CM | POA: Diagnosis not present

## 2022-06-20 DIAGNOSIS — J189 Pneumonia, unspecified organism: Secondary | ICD-10-CM | POA: Diagnosis not present

## 2022-06-20 DIAGNOSIS — D869 Sarcoidosis, unspecified: Secondary | ICD-10-CM | POA: Diagnosis not present

## 2022-06-21 NOTE — Telephone Encounter (Signed)
Copied from CRM [id number]. Topic: General - Inquiry >> Jun 21, 2022 11:28 AM [place] wrote: Reason for EAV:[id number] from [facility] is calling to follow up on the Incontinence Supplies Form that was dropped off by a Rep on 5/13 or 5/15.  Please advise.

## 2022-06-22 NOTE — Telephone Encounter (Signed)
Will fax when signed

## 2022-07-02 NOTE — Telephone Encounter (Signed)
[facility] called for a status update on form, please advise

## 2022-07-03 NOTE — Telephone Encounter (Signed)
Teresa Mendez from [facility] has been informed that [name] needs an appointment for the orders to be signed and sent.

## 2022-07-24 ENCOUNTER — Other Ambulatory Visit: Payer: Self-pay | Admitting: Nurse Practitioner

## 2022-07-24 NOTE — Telephone Encounter (Signed)
Medication Refill - Medication:  albuterol (PROVENTIL) (2.5 MG/3ML) 0.083% nebulizer solution  Has the patient contacted their pharmacy? Yes.   Adaly from pharmacy request a new script from provider as patients previous provider wrote the original script with no refills left  Preferred Pharmacy (with phone number or street name): [facility] - [place], [place] - [address]  Phone: [phone] Fax: [phone]  Has the patient been seen for an appointment in the last year OR does the patient have an upcoming appointment? No.  Agent: Please be advised that RX refills may take up to 3 business days. We ask that you follow-up with your pharmacy.

## 2022-07-25 MED ORDER — ALBUTEROL SULFATE (2.5 MG/3ML) 0.083% IN NEBU: 3.0000 mL | INHALATION_SOLUTION | Freq: Four times a day (QID) | RESPIRATORY_TRACT | 0 refills | Status: DC | PRN

## 2022-07-25 NOTE — Telephone Encounter (Signed)
Requested medication (s) are due for refill today:yes  Requested medication (s) are on the active medication list: yes  Last refill:  12/27/21 150 ml  Future visit scheduled: yes  Notes to clinic:  Needs appt with PCP. No-showed earlier this month.    Requested Prescriptions  Pending Prescriptions Disp Refills   albuterol (PROVENTIL) (2.5 MG/3ML) 0.083% nebulizer solution 150 mL 0    Sig: TAKE 3 MLS (2.5 MG TOTAL) BY NEBULIZATION EVERY 6 (SIX) HOURS AS NEEDED FOR WHEEZING OR SHORTNESS OF BREATH.     Pulmonology:  Beta Agonists 2 Failed - 07/24/2022  2:35 PM      Failed - Last BP in normal range    BP Readings from Last 1 Encounters:  09/08/21 (!) 156/100         Passed - Last Heart Rate in normal range    Pulse Readings from Last 1 Encounters:  09/08/21 87         Passed - Valid encounter within last 12 months    Recent Outpatient Visits           10 months ago Primary hypertension   [place] [facility] [place], Shea Stakes, NP   1 year ago Stress incontinence of urine   [facility] [place], Shea Stakes, NP   1 year ago Uterine leiomyoma, unspecified location   [facility], Marzella Schlein, [place]   1 year ago PULMONARY SARCOIDOSIS   [facility] Claiborne Rigg, NP   1 year ago Encounter for completion of form with patient   [facility] Claiborne Rigg, NP       Future Appointments             In 1 month Claiborne Rigg, NP [facility]

## 2022-08-29 ENCOUNTER — Ambulatory Visit: Payer: Medicaid Other | Attending: Nurse Practitioner | Admitting: Nurse Practitioner

## 2022-08-29 VITALS — BP 163/87 | HR 80 | Ht 64.0 in | Wt 255.8 lb

## 2022-08-29 DIAGNOSIS — M25561 Pain in right knee: Secondary | ICD-10-CM | POA: Diagnosis not present

## 2022-08-29 DIAGNOSIS — M2550 Pain in unspecified joint: Secondary | ICD-10-CM

## 2022-08-29 DIAGNOSIS — E785 Hyperlipidemia, unspecified: Secondary | ICD-10-CM

## 2022-08-29 DIAGNOSIS — J849 Interstitial pulmonary disease, unspecified: Secondary | ICD-10-CM

## 2022-08-29 DIAGNOSIS — G8929 Other chronic pain: Secondary | ICD-10-CM

## 2022-08-29 DIAGNOSIS — I10 Essential (primary) hypertension: Secondary | ICD-10-CM

## 2022-08-29 DIAGNOSIS — K219 Gastro-esophageal reflux disease without esophagitis: Secondary | ICD-10-CM

## 2022-08-29 DIAGNOSIS — H938X1 Other specified disorders of right ear: Secondary | ICD-10-CM

## 2022-08-29 DIAGNOSIS — D649 Anemia, unspecified: Secondary | ICD-10-CM

## 2022-08-29 DIAGNOSIS — Z23 Encounter for immunization: Secondary | ICD-10-CM

## 2022-08-29 MED ORDER — ATORVASTATIN CALCIUM 80 MG PO TABS
80.0000 mg | ORAL_TABLET | Freq: Every day | ORAL | 1 refills | Status: DC
Start: 2022-08-29 — End: 2023-10-23

## 2022-08-29 MED ORDER — DICLOFENAC SODIUM 1 % EX GEL
2.0000 g | Freq: Four times a day (QID) | CUTANEOUS | 1 refills | Status: DC
Start: 2022-08-29 — End: 2023-10-23

## 2022-08-29 MED ORDER — VENTOLIN HFA 108 (90 BASE) MCG/ACT IN AERS
1.0000 | INHALATION_SPRAY | RESPIRATORY_TRACT | 1 refills | Status: DC | PRN
Start: 2022-08-29 — End: 2023-09-25

## 2022-08-29 MED ORDER — FLUTICASONE PROPIONATE 50 MCG/ACT NA SUSP
2.0000 | Freq: Every day | NASAL | 2 refills | Status: DC
Start: 2022-08-29 — End: 2023-10-23

## 2022-08-29 MED ORDER — OMEPRAZOLE 20 MG PO CPDR
20.0000 mg | DELAYED_RELEASE_CAPSULE | Freq: Every day | ORAL | 1 refills | Status: DC
Start: 2022-08-29 — End: 2023-10-23

## 2022-08-29 MED ORDER — FAMOTIDINE 20 MG PO TABS
20.0000 mg | ORAL_TABLET | Freq: Every day | ORAL | 1 refills | Status: DC
Start: 2022-08-29 — End: 2023-10-23

## 2022-08-29 MED ORDER — HYDROCHLOROTHIAZIDE 25 MG PO TABS
25.0000 mg | ORAL_TABLET | Freq: Every day | ORAL | 1 refills | Status: DC
Start: 2022-08-29 — End: 2023-10-23

## 2022-08-29 MED ORDER — DULOXETINE HCL 60 MG PO CPEP
60.0000 mg | ORAL_CAPSULE | Freq: Every day | ORAL | 1 refills | Status: DC
Start: 2022-08-29 — End: 2023-10-23

## 2022-08-29 MED ORDER — CARVEDILOL 6.25 MG PO TABS
6.2500 mg | ORAL_TABLET | Freq: Two times a day (BID) | ORAL | 1 refills | Status: DC
Start: 2022-08-29 — End: 2023-10-23

## 2022-08-29 MED ORDER — LISINOPRIL 40 MG PO TABS
40.0000 mg | ORAL_TABLET | Freq: Every day | ORAL | 1 refills | Status: DC
Start: 2022-08-29 — End: 2023-10-23

## 2022-08-29 MED ORDER — BREZTRI AEROSPHERE 160-9-4.8 MCG/ACT IN AERO
2.0000 | INHALATION_SPRAY | Freq: Two times a day (BID) | RESPIRATORY_TRACT | 11 refills | Status: DC
Start: 2022-08-29 — End: 2023-02-19

## 2022-08-29 MED ORDER — PREDNISONE 20 MG PO TABS
40.0000 mg | ORAL_TABLET | Freq: Every day | ORAL | 0 refills | Status: DC
Start: 2022-08-29 — End: 2023-06-07

## 2022-08-29 MED ORDER — MONTELUKAST SODIUM 10 MG PO TABS
10.0000 mg | ORAL_TABLET | Freq: Every day | ORAL | 1 refills | Status: DC
Start: 2022-08-29 — End: 2023-10-23

## 2022-08-29 MED ORDER — ALBUTEROL SULFATE (2.5 MG/3ML) 0.083% IN NEBU
3.0000 mL | INHALATION_SOLUTION | Freq: Four times a day (QID) | RESPIRATORY_TRACT | 11 refills | Status: DC | PRN
Start: 2022-08-29 — End: 2023-09-25

## 2022-08-29 MED ORDER — TRIAMCINOLONE ACETONIDE 0.1 % EX OINT: 1.0000 | TOPICAL_OINTMENT | Freq: Two times a day (BID) | CUTANEOUS | 3 refills | Status: DC

## 2022-08-29 MED ORDER — MASKS MISC: 0 refills | Status: DC

## 2022-08-29 MED ORDER — AMLODIPINE BESYLATE 10 MG PO TABS
10.0000 mg | ORAL_TABLET | Freq: Every day | ORAL | 1 refills | Status: DC
Start: 2022-08-29 — End: 2023-10-23

## 2022-08-29 MED ORDER — CETIRIZINE HCL 10 MG PO TABS
10.0000 mg | ORAL_TABLET | Freq: Every day | ORAL | 1 refills | Status: DC
Start: 2022-08-29 — End: 2023-10-23

## 2022-08-29 MED ORDER — POTASSIUM CHLORIDE CRYS ER 20 MEQ PO TBCR: 20.0000 meq | EXTENDED_RELEASE_TABLET | Freq: Every day | ORAL | 1 refills | Status: DC

## 2022-08-29 NOTE — Progress Notes (Signed)
Assessment & Plan:  Teresa Mendez was seen today for medical management of chronic issues.  Diagnoses and all orders for this visit:  Primary hypertension -     amLODipine (NORVASC) 10 MG tablet; Take 1 tablet (10 mg total) by mouth daily. -     carvedilol (COREG) 6.25 MG tablet; Take 1 tablet (6.25 mg total) by mouth 2 (two) times daily with a meal. -     lisinopril (ZESTRIL) 40 MG tablet; Take 1 tablet (40 mg total) by mouth daily. -     CMP14+EGFR -     potassium chloride SA (KLOR-CON M) 20 MEQ tablet; Take 1 tablet (20 mEq total) by mouth daily. -     hydrochlorothiazide (HYDRODIURIL) 25 MG tablet; Take 1 tablet (25 mg total) by mouth daily.  Chronic pain of right knee -     diclofenac Sodium (VOLTAREN) 1 % GEL; Apply 2 g topically 4 (four) times daily.  Need for Tdap vaccination -     Tdap vaccine greater than or equal to 7yo IM  Dyslipidemia, goal LDL below 100 -     atorvastatin (LIPITOR) 80 MG tablet; Take 1 tablet (80 mg total) by mouth daily. -     Lipid panel  Pressure sensation in right ear -     fluticasone (FLONASE) 50 MCG/ACT nasal spray; Place 2 sprays into both nostrils daily.  Gastroesophageal reflux disease without esophagitis -     famotidine (PEPCID) 20 MG tablet; Take 1 tablet (20 mg total) by mouth at bedtime. -     omeprazole (PRILOSEC) 20 MG capsule; Take 1 capsule (20 mg total) by mouth daily.  Arthralgia of multiple joints -     DULoxetine (CYMBALTA) 60 MG capsule; Take 1 capsule (60 mg total) by mouth daily.  Interstitial pulmonary disease (HCC) -     predniSONE (DELTASONE) 20 MG tablet; Take 2 tablets (40 mg total) by mouth daily with breakfast. -     montelukast (SINGULAIR) 10 MG tablet; Take 1 tablet (10 mg total) by mouth at bedtime. -     albuterol (VENTOLIN HFA) 108 (90 Base) MCG/ACT inhaler; Inhale 1-2 puffs into the lungs every 4 (four) hours as needed for wheezing or shortness of breath. -     albuterol (PROVENTIL) (2.5 MG/3ML) 0.083% nebulizer  solution; TAKE 3 MLS (2.5 MG TOTAL) BY NEBULIZATION EVERY 6 (SIX) HOURS AS NEEDED FOR WHEEZING OR SHORTNESS OF BREATH. -     Budeson-Glycopyrrol-Formoterol (BREZTRI AEROSPHERE) 160-9-4.8 MCG/ACT AERO; Inhale 2 puffs into the lungs in the morning and at bedtime. -     cetirizine (ZYRTEC) 10 MG tablet; Take 1 tablet (10 mg total) by mouth daily. -     Masks MISC; Nebulizer face mask J45.909  Anemia, unspecified type -     CBC with Differential  Other orders -     triamcinolone ointment (KENALOG) 0.1 %; Apply 1 Application topically 2 (two) times daily.    Patient has been counseled on age-appropriate routine health concerns for screening and prevention. These are reviewed and up-to-date. Referrals have been placed accordingly. Immunizations are up-to-date or declined.    Subjective:   Chief Complaint  Patient presents with   Medical Management of Chronic Issues   HPI Teresa Mendez 47 y.o. female presents to office today for follow up HTN   She has a past medical history of Asthma, Hypertension, Chronic Hypoxic resp failure on home O2, Sarcoidosis, Sickle cell trait, and Stress incontinence.    Urinary Incontinence Patient complains  of urinary incontinence. This has been present for several years. She leaks urine with bending, coughing, lifting, standing, walking, sneezing, with urge, with a full bladder, during the night. Patient describes the symptoms as  urge to urinate with little or no warning, urine leakage with coughing/heavy physical activity and urine leaking unpredictably.  Factors associated with symptoms include mulitparous, Morbid obesity, diuretic medication. Evaluation to date includes UA/CS: normal.    HTN Did not take her blood pressure  medication yesterday. Blood pressure is elevated today. She is currently prescribed amlodipine 10 mg daily, carvedilol 6.25 mg BID, hydrochlorothiazide 25 mg daily and lisinopril 40 mg daily. She has not been taking her medications every  day as prescribed.  BP Readings from Last 3 Encounters:  08/29/22 (!) 163/87  09/08/21 (!) 156/100  08/31/21 128/88    Right Knee pain She has Chronic bilateral knee pain with R>L.[facility] upcoming app[facility] for this.     Review of Systems  Constitutional:  Negative for fever, malaise/fatigue and weight loss.  HENT:  Positive for congestion and ear pain. Negative for nosebleeds.   Eyes: Negative.  Negative for blurred vision, double vision and photophobia.  Respiratory: Negative.  Negative for cough and shortness of breath.   Cardiovascular: Negative.  Negative for chest pain, palpitations and leg swelling.  Gastrointestinal:  Positive for heartburn. Negative for nausea and vomiting.  Genitourinary:        Incotinence  Musculoskeletal:  Positive for joint pain. Negative for myalgias.  Neurological: Negative.  Negative for dizziness, focal weakness, seizures and headaches.  Psychiatric/Behavioral: Negative.  Negative for suicidal ideas.     Past Medical History:  Diagnosis Date   Asthma    Hypertension    Sarcoidosis    Sickle cell trait (HCC)    Stress incontinence     Past Surgical History:  Procedure Laterality Date   BREAST SURGERY Right    biopsy   TUBAL LIGATION      Family History  Problem Relation Age of Onset   Hypertension Mother    Congestive Heart Failure Mother    Kidney failure Mother    Sickle cell trait Mother    Hypertension Father    Congestive Heart Failure Father    Kidney failure Father    Glaucoma Father    Hypertension Brother    Liver disease Brother    Sickle cell trait Son    Sickle cell trait Daughter    Sickle cell trait Daughter     Social History Reviewed with no changes to be made today.   Outpatient Medications Prior to Visit  Medication Sig Dispense Refill   acetaminophen-codeine (TYLENOL #3) 300-30 MG tablet Take 1-2 tablets by mouth every 4 (four) hours as needed for moderate pain. 60 tablet 0   albuterol  (PROVENTIL) (2.5 MG/3ML) 0.083% nebulizer solution TAKE 3 MLS (2.5 MG TOTAL) BY NEBULIZATION EVERY 6 (SIX) HOURS AS NEEDED FOR WHEEZING OR SHORTNESS OF BREATH. 150 mL 0   aspirin EC 81 MG tablet Take 81 mg by mouth daily.     OXYGEN Inhale into the lungs. 3 liters     albuterol (VENTOLIN HFA) 108 (90 Base) MCG/ACT inhaler Inhale 1-2 puffs into the lungs every 6 (six) hours as needed for wheezing or shortness of breath. 54 g 1   amLODipine (NORVASC) 10 MG tablet Take 1 tablet (10 mg total) by mouth daily. 30 tablet 0   atorvastatin (LIPITOR) 80 MG tablet Take 1 tablet (80 mg total) by  mouth daily. 30 tablet 0   carvedilol (COREG) 6.25 MG tablet Take 1 tablet (6.25 mg total) by mouth 2 (two) times daily with a meal. 60 tablet 0   cetirizine (ZYRTEC) 10 MG tablet TAKE 1 TABLET (10 MG TOTAL) BY MOUTH DAILY. 30 tablet 0   DULoxetine (CYMBALTA) 60 MG capsule Take 1 capsule (60 mg total) by mouth daily. 90 capsule 1   famotidine (PEPCID) 20 MG tablet Take 1 tablet (20 mg total) by mouth at bedtime. 90 tablet 3   hydrochlorothiazide (HYDRODIURIL) 25 MG tablet Take 1 tablet (25 mg total) by mouth daily. 30 tablet 0   lisinopril (ZESTRIL) 40 MG tablet Take 1 tablet (40 mg total) by mouth daily. 30 tablet 0   Masks MISC Nebulizer face mask J45.909 1 each 0   montelukast (SINGULAIR) 10 MG tablet Take 1 tablet (10 mg total) by mouth at bedtime. 30 tablet 0   omeprazole (PRILOSEC) 20 MG capsule Take 1 capsule (20 mg total) by mouth daily. 30 capsule 0   potassium chloride SA (KLOR-CON M) 20 MEQ tablet Take 1 tablet (20 mEq total) by mouth daily. 30 tablet 0   olopatadine ([employer] OLOPATADINE HCL) 0.1 % ophthalmic solution Place 1 drop into both eyes 2 (two) times daily. (Patient not taking: Reported on 08/29/2022) 5 mL 12   albuterol (PROVENTIL) (2.5 MG/3ML) 0.083% nebulizer solution TAKE 3 MLS (2.5 MG TOTAL) BY NEBULIZATION EVERY 6 (SIX) HOURS AS NEEDED FOR WHEEZING OR SHORTNESS OF BREATH. 150 mL 11    Budeson-Glycopyrrol-Formoterol (BREZTRI AEROSPHERE) 160-9-4.8 MCG/ACT AERO Inhale 2 puffs into the lungs in the morning and at bedtime. (Patient not taking: Reported on 08/29/2022) 10.7 g 11   ferrous sulfate 325 (65 FE) MG tablet Take 1 tablet (325 mg total) by mouth daily with breakfast. 30 tablet 2   fluticasone (FLONASE) 50 MCG/ACT nasal spray Place 2 sprays into both nostrils daily. (Patient not taking: Reported on 08/29/2022) 16 g 2   triamcinolone ointment (KENALOG) 0.1 % Apply 1 application topically 2 (two) times daily. (Patient not taking: Reported on 08/29/2022) 60 g 0   No facility-administered medications prior to visit.    No Known Allergies     Objective:    BP (!) 163/87 (BP Location: Left Arm, Patient Position: Sitting, Cuff Size: Large)   Pulse 80   Ht 5\' 4"  (1.626 m)   Wt 255 lb 12.8 oz (116 kg)   LMP 08/13/2022 (Approximate)   SpO2 100%   BMI 43.91 kg/m  Wt Readings from Last 3 Encounters:  08/29/22 255 lb 12.8 oz (116 kg)  09/08/21 264 lb 6.4 oz (119.9 kg)  08/31/21 261 lb 3.2 oz (118.5 kg)    Physical Exam Vitals and nursing note reviewed.  Constitutional:      Appearance: She is well-developed.  HENT:     Head: Normocephalic and atraumatic.     Right Ear: Hearing, tympanic membrane, ear canal and external ear normal.     Left Ear: Hearing, tympanic membrane, ear canal and external ear normal.  Cardiovascular:     Rate and Rhythm: Normal rate and regular rhythm.     Heart sounds: Normal heart sounds. No murmur heard.    No friction rub. No gallop.  Pulmonary:     Effort: Pulmonary effort is normal. No tachypnea or respiratory distress.     Breath sounds: Normal breath sounds. No decreased breath sounds, wheezing, rhonchi or rales.  Chest:     Chest wall: No tenderness.  Abdominal:  General: Bowel sounds are normal.     Palpations: Abdomen is soft.  Musculoskeletal:        General: Normal range of motion.     Cervical back: Normal range of motion.   Skin:    General: Skin is warm and dry.  Neurological:     Mental Status: She is alert and oriented to person, place, and time.     Coordination: Coordination normal.  Psychiatric:        Behavior: Behavior normal. Behavior is cooperative.        Thought Content: Thought content normal.        Judgment: Judgment normal.          Patient has been counseled extensively about nutrition and exercise as well as the importance of adherence with medications and regular follow-up. The patient was given clear instructions to go to ER or return to medical center if symptoms don't improve, worsen or new problems develop. The patient verbalized understanding.   Follow-up: Return in about 3 months (around 11/29/2022).   Claiborne Rigg, FNP-BC [facility] [place], [place] [phone]   09/13/2022, 11:19 PM

## 2022-08-30 ENCOUNTER — Other Ambulatory Visit: Payer: Self-pay

## 2022-08-30 LAB — CBC WITH DIFFERENTIAL/PLATELET
Basophils Absolute: 0 x10E3/uL (ref 0.0–0.2)
Basos: 0 %
EOS (ABSOLUTE): 0.2 x10E3/uL (ref 0.0–0.4)
Eos: 2 %
Hematocrit: 38.6 % (ref 34.0–46.6)
Hemoglobin: 11.9 g/dL (ref 11.1–15.9)
Immature Grans (Abs): 0 x10E3/uL (ref 0.0–0.1)
Immature Granulocytes: 0 %
Lymphocytes Absolute: 2.7 x10E3/uL (ref 0.7–3.1)
Lymphs: 26 %
MCH: 24.4 pg — ABNORMAL LOW (ref 26.6–33.0)
MCHC: 30.8 g/dL — ABNORMAL LOW (ref 31.5–35.7)
MCV: 79 fL (ref 79–97)
Monocytes Absolute: 0.7 x10E3/uL (ref 0.1–0.9)
Monocytes: 7 %
Neutrophils Absolute: 6.7 x10E3/uL (ref 1.4–7.0)
Neutrophils: 65 %
Platelets: 350 x10E3/uL (ref 150–450)
RBC: 4.88 x10E6/uL (ref 3.77–5.28)
RDW: 14 % (ref 11.7–15.4)
WBC: 10.4 x10E3/uL (ref 3.4–10.8)

## 2022-10-05 DIAGNOSIS — I10 Essential (primary) hypertension: Secondary | ICD-10-CM | POA: Diagnosis not present

## 2022-10-05 DIAGNOSIS — R32 Unspecified urinary incontinence: Secondary | ICD-10-CM | POA: Diagnosis not present

## 2022-10-29 ENCOUNTER — Telehealth: Payer: Self-pay | Admitting: Nurse Practitioner

## 2022-10-29 NOTE — Telephone Encounter (Signed)
Patient has called and stated she needs a new assessment form filled out for [facility] for her to have assistance in the home/aides that come out who assist her in the home.   Please contact patient back @ # [phone].

## 2022-10-29 NOTE — Telephone Encounter (Signed)
Patient is making a request for PCS services. Application is being completed.

## 2022-10-30 DIAGNOSIS — I10 Essential (primary) hypertension: Secondary | ICD-10-CM | POA: Diagnosis not present

## 2022-10-30 DIAGNOSIS — R32 Unspecified urinary incontinence: Secondary | ICD-10-CM | POA: Diagnosis not present

## 2022-11-07 ENCOUNTER — Ambulatory Visit: Payer: Medicaid Other | Admitting: Pulmonary Disease

## 2022-11-07 ENCOUNTER — Encounter: Payer: Self-pay | Admitting: Pulmonary Disease

## 2022-11-30 DIAGNOSIS — R32 Unspecified urinary incontinence: Secondary | ICD-10-CM | POA: Diagnosis not present

## 2022-11-30 DIAGNOSIS — I10 Essential (primary) hypertension: Secondary | ICD-10-CM | POA: Diagnosis not present

## 2022-12-19 ENCOUNTER — Ambulatory Visit: Payer: Medicaid Other

## 2022-12-19 NOTE — Telephone Encounter (Signed)
Pt called on cell # received message "call cannot be completed...". called pt on home #, woman answered, I asked for Teresa Mendez and lady states wrong number and disconnected call. Will send to provider since this isn't anything NT can do for pt's requests.   Summary: request for bp/oxygen meter   Patient called requesting provider send a script to [facility] for her to get BP Machine, Peak flow meter and oxygen meter. Please f/u with patient

## 2022-12-21 ENCOUNTER — Other Ambulatory Visit: Payer: Self-pay | Admitting: Nurse Practitioner

## 2022-12-21 DIAGNOSIS — I10 Essential (primary) hypertension: Secondary | ICD-10-CM | POA: Diagnosis not present

## 2022-12-21 MED ORDER — BLOOD PRESSURE MONITOR DEVI: 0 refills | Status: DC

## 2022-12-21 NOTE — Telephone Encounter (Signed)
BP monitor sent to [facility]. She can contact them regarding pick up.   [phone]   Other requests need to go through pulmonology

## 2022-12-21 NOTE — Telephone Encounter (Signed)
Patient made aware Per provider we can send BP cuff and pulse ox to [facility] with medicaid, but the other request has to come from pulmonology.

## 2022-12-25 ENCOUNTER — Telehealth: Payer: Self-pay | Admitting: Nurse Practitioner

## 2022-12-25 NOTE — Telephone Encounter (Signed)
Pt is calling in wanting an update on the status of her order for a BP Cuff and an Oxygen sensor. Please follow up with pt.

## 2022-12-26 ENCOUNTER — Other Ambulatory Visit: Payer: Self-pay | Admitting: Nurse Practitioner

## 2022-12-26 DIAGNOSIS — J4489 Other specified chronic obstructive pulmonary disease: Secondary | ICD-10-CM

## 2022-12-26 MED ORDER — PULSE OXIMETER FOR FINGER MISC
0 refills | Status: DC
Start: 2022-12-26 — End: 2023-02-18

## 2022-12-26 NOTE — Telephone Encounter (Signed)
Call placed to patient unable to reach message left on VM.

## 2023-01-16 ENCOUNTER — Telehealth: Payer: Self-pay | Admitting: Nurse Practitioner

## 2023-01-16 NOTE — Telephone Encounter (Signed)
Copied from CRM [id number]. Topic: Referral - Request for Referral >> Jan 16, 2023  5:21 PM Priscille Loveless wrote: Reason for CRM:  Pt needs prior authorization for in house aide. Peterson Ao, [employer] MCD called requesting. Phone [phone] Please submit request.

## 2023-01-17 NOTE — Telephone Encounter (Signed)
Unable to contact patient by phone. Voicemail left to return call for results.

## 2023-01-25 ENCOUNTER — Ambulatory Visit: Payer: Medicaid Other | Attending: Nurse Practitioner | Admitting: Nurse Practitioner

## 2023-01-25 DIAGNOSIS — N393 Stress incontinence (female) (male): Secondary | ICD-10-CM

## 2023-01-25 NOTE — Progress Notes (Unsigned)
Virtual Visit Consent   Teresa Mendez, you are scheduled for a virtual visit with a [facility] provider today. Just as with appointments in the office, your consent must be obtained to participate. Your consent will be active for this visit and any virtual visit you may have with one of our providers in the next 365 days. If you have a MyChart account, a copy of this consent can be sent to you electronically.  As this is a virtual visit, video technology does not allow for your provider to perform a traditional examination. This may limit your provider's ability to fully assess your condition. If your provider identifies any concerns that need to be evaluated in person or the need to arrange testing (such as labs, EKG, etc.), we will make arrangements to do so. Although advances in technology are sophisticated, we cannot ensure that it will always work on either your end or our end. If the connection with a video visit is poor, the visit may have to be switched to a telephone visit. With either a video or telephone visit, we are not always able to ensure that we have a secure connection.  By engaging in this virtual visit, you consent to the provision of healthcare and authorize for your insurance to be billed (if applicable) for the services provided during this visit. Depending on your insurance coverage, you may receive a charge related to this service.  I need to obtain your verbal consent now. Are you willing to proceed with your visit today? Teresa Mendez has provided verbal consent on 01/26/2023 for a virtual visit (video or telephone). Teresa Rigg, NP  Date: 01/26/2023 11:02 PM  Virtual Visit via Video Note   I, Teresa Mendez, connected with  Teresa Mendez  ([id number], [id number]) on 01/26/23 at  1:30 PM EST by a video-enabled telemedicine application and verified that I am speaking with the correct person using two identifiers.  Location: Patient: Virtual Visit Location Patient:  Home Provider: Virtual Visit Location Provider: Home Office   I discussed the limitations of evaluation and management by telemedicine and the availability of in person appointments. The patient expressed understanding and agreed to proceed.    History of Present Illness: Teresa Mendez is a 47 y.o. who identifies as a female who was assigned female at birth, and is being seen today for stress incontinence and renewal of PCS services.   Urinary Incontinence Patient complains of urinary incontinence. This has been present for several years. She leaks urine with bending, coughing, lifting, standing, walking, sneezing, with urge, with a full bladder, during the night. Patient describes the symptoms as  urge to urinate with little or no warning, urine leakage with coughing/heavy physical activity and urine leaking unpredictably.  Factors associated with symptoms include mulitparous, Morbid obesity, diuretic medication. Evaluation to date includes UA/CS: normal.     PCS services She requires help with dressing, grooming and entering/exiting the shower due to her chronic pain and sarcoidosis.     Problems:  Patient Active Problem List   Diagnosis Date Noted   Primary osteoarthritis of both knees 09/05/2021   Former smoker 03/24/2020   Breast nodule 03/24/2020   GERD (gastroesophageal reflux disease) 09/07/2019   Ground glass opacity present on imaging of lung 09/07/2019   Interstitial pulmonary disease (HCC) 09/07/2019   Emphysema lung (HCC) 07/03/2019   Abnormal findings on diagnostic imaging of lung 07/03/2019   Myalgia 07/03/2019   Hypertensive urgency 05/18/2019   Chest  pain 05/18/2019   Acute respiratory failure with hypoxia (HCC) 05/17/2019   PULMONARY SARCOIDOSIS 07/14/2007   Asthma-COPD overlap syndrome (HCC) 07/14/2007   Essential hypertension 06/27/2007   DYSPNEA 06/27/2007    Allergies: No Known Allergies Medications:  Current Outpatient Medications:     acetaminophen-codeine (TYLENOL #3) 300-30 MG tablet, Take 1-2 tablets by mouth every 4 (four) hours as needed for moderate pain., Disp: 60 tablet, Rfl: 0   albuterol (PROVENTIL) (2.5 MG/3ML) 0.083% nebulizer solution, TAKE 3 MLS (2.5 MG TOTAL) BY NEBULIZATION EVERY 6 (SIX) HOURS AS NEEDED FOR WHEEZING OR SHORTNESS OF BREATH., Disp: 150 mL, Rfl: 0   albuterol (PROVENTIL) (2.5 MG/3ML) 0.083% nebulizer solution, TAKE 3 MLS (2.5 MG TOTAL) BY NEBULIZATION EVERY 6 (SIX) HOURS AS NEEDED FOR WHEEZING OR SHORTNESS OF BREATH., Disp: 150 mL, Rfl: 11   albuterol (VENTOLIN HFA) 108 (90 Base) MCG/ACT inhaler, Inhale 1-2 puffs into the lungs every 4 (four) hours as needed for wheezing or shortness of breath., Disp: 126 g, Rfl: 1   amLODipine (NORVASC) 10 MG tablet, Take 1 tablet (10 mg total) by mouth daily., Disp: 90 tablet, Rfl: 1   aspirin EC 81 MG tablet, Take 81 mg by mouth daily., Disp: , Rfl:    atorvastatin (LIPITOR) 80 MG tablet, Take 1 tablet (80 mg total) by mouth daily., Disp: 90 tablet, Rfl: 1   Blood Pressure Monitor DEVI, Please provide patient with insurance approved blood pressure monitor I10.0, Disp: 1 each, Rfl: 0   Budeson-Glycopyrrol-Formoterol (BREZTRI AEROSPHERE) 160-9-4.8 MCG/ACT AERO, Inhale 2 puffs into the lungs in the morning and at bedtime., Disp: 10.7 g, Rfl: 11   carvedilol (COREG) 6.25 MG tablet, Take 1 tablet (6.25 mg total) by mouth 2 (two) times daily with a meal., Disp: 180 tablet, Rfl: 1   cetirizine (ZYRTEC) 10 MG tablet, Take 1 tablet (10 mg total) by mouth daily., Disp: 90 tablet, Rfl: 1   diclofenac Sodium (VOLTAREN) 1 % GEL, Apply 2 g topically 4 (four) times daily., Disp: 200 g, Rfl: 1   DULoxetine (CYMBALTA) 60 MG capsule, Take 1 capsule (60 mg total) by mouth daily., Disp: 90 capsule, Rfl: 1   famotidine (PEPCID) 20 MG tablet, Take 1 tablet (20 mg total) by mouth at bedtime., Disp: 90 tablet, Rfl: 1   fluticasone (FLONASE) 50 MCG/ACT nasal spray, Place 2 sprays into both  nostrils daily., Disp: 16 g, Rfl: 2   hydrochlorothiazide (HYDRODIURIL) 25 MG tablet, Take 1 tablet (25 mg total) by mouth daily., Disp: 90 tablet, Rfl: 1   lisinopril (ZESTRIL) 40 MG tablet, Take 1 tablet (40 mg total) by mouth daily., Disp: 90 tablet, Rfl: 1   Masks MISC, Nebulizer face mask J45.909, Disp: 1 each, Rfl: 0   Misc. Devices (PULSE OXIMETER FOR FINGER) MISC, Use as instructed J44.89, Disp: 1 each, Rfl: 0   montelukast (SINGULAIR) 10 MG tablet, Take 1 tablet (10 mg total) by mouth at bedtime., Disp: 90 tablet, Rfl: 1   olopatadine (CVS OLOPATADINE HCL) 0.1 % ophthalmic solution, Place 1 drop into both eyes 2 (two) times daily. (Patient not taking: Reported on 08/29/2022), Disp: 5 mL, Rfl: 12   omeprazole (PRILOSEC) 20 MG capsule, Take 1 capsule (20 mg total) by mouth daily., Disp: 90 capsule, Rfl: 1   OXYGEN, Inhale into the lungs. 3 liters, Disp: , Rfl:    potassium chloride SA (KLOR-CON M) 20 MEQ tablet, Take 1 tablet (20 mEq total) by mouth daily., Disp: 90 tablet, Rfl: 1   predniSONE (DELTASONE)  20 MG tablet, Take 2 tablets (40 mg total) by mouth daily with breakfast., Disp: 3 tablet, Rfl: 0   triamcinolone ointment (KENALOG) 0.1 %, Apply 1 Application topically 2 (two) times daily., Disp: 60 g, Rfl: 3  Observations/Objective: Patient is well-developed, well-nourished in no acute distress.  Resting comfortably at home.  Head is normocephalic, atraumatic.  No labored breathing.  Speech is clear and coherent with logical content.  Patient is alert and oriented at baseline.    Assessment and Plan: 1. Stress incontinence of urine (Primary) PR DISPOSABLE UNDERPADS PR NON-STERILE GLOVES PR ADULT SIZE PULL-ON XL   PCS Services Forms completed and signed.   Follow Up Instructions: I discussed the assessment and treatment plan with the patient. The patient was provided an opportunity to ask questions and all were answered. The patient agreed with the plan and demonstrated an  understanding of the instructions.  A copy of instructions were sent to the patient via MyChart unless otherwise noted below.    The patient was advised to call back or seek an in-person evaluation if the symptoms worsen or if the condition fails to improve as anticipated.    Teresa Rigg, NP

## 2023-01-26 ENCOUNTER — Encounter: Payer: Self-pay | Admitting: Nurse Practitioner

## 2023-01-28 ENCOUNTER — Telehealth: Payer: Self-pay | Admitting: Nurse Practitioner

## 2023-01-28 NOTE — Telephone Encounter (Signed)
Per Ms Nitchman, [facility] called and stated that they do not have an order for plusox-meter. Please advise.

## 2023-01-29 ENCOUNTER — Other Ambulatory Visit: Payer: Self-pay

## 2023-01-29 DIAGNOSIS — J4489 Other specified chronic obstructive pulmonary disease: Secondary | ICD-10-CM

## 2023-01-29 NOTE — Telephone Encounter (Addendum)
 Spoke with patient patient informed that unfortunately,  her insurance does not cover plusox-meter. Advised that she can purchase at any drug store or we can place a referral to [facility]. Patient asked that the referral be placed. [facility] referral placed.

## 2023-01-29 NOTE — Progress Notes (Signed)
 Marland Kitchen

## 2023-01-30 DIAGNOSIS — J189 Pneumonia, unspecified organism: Secondary | ICD-10-CM | POA: Diagnosis not present

## 2023-01-30 DIAGNOSIS — J45909 Unspecified asthma, uncomplicated: Secondary | ICD-10-CM | POA: Diagnosis not present

## 2023-01-30 DIAGNOSIS — J849 Interstitial pulmonary disease, unspecified: Secondary | ICD-10-CM | POA: Diagnosis not present

## 2023-01-30 DIAGNOSIS — D869 Sarcoidosis, unspecified: Secondary | ICD-10-CM | POA: Diagnosis not present

## 2023-01-30 DIAGNOSIS — J439 Emphysema, unspecified: Secondary | ICD-10-CM | POA: Diagnosis not present

## 2023-01-31 DIAGNOSIS — R32 Unspecified urinary incontinence: Secondary | ICD-10-CM | POA: Diagnosis not present

## 2023-01-31 DIAGNOSIS — I10 Essential (primary) hypertension: Secondary | ICD-10-CM | POA: Diagnosis not present

## 2023-02-05 ENCOUNTER — Other Ambulatory Visit: Payer: Self-pay

## 2023-02-05 DIAGNOSIS — J4489 Other specified chronic obstructive pulmonary disease: Secondary | ICD-10-CM

## 2023-02-08 ENCOUNTER — Telehealth: Payer: Self-pay

## 2023-02-08 NOTE — Telephone Encounter (Signed)
 Copied from CRM [id number]. Topic: General - Other >> Feb 08, 2023  8:48 AM Marlow Baars wrote: Reason for CRM: [place] with [facility] called stating they received an order for an oximeter but they do not do that there. Please assist patient further

## 2023-02-11 NOTE — Telephone Encounter (Signed)
 Faxed to [facility]

## 2023-02-12 ENCOUNTER — Telehealth: Payer: Self-pay

## 2023-02-12 ENCOUNTER — Other Ambulatory Visit: Payer: Self-pay

## 2023-02-12 NOTE — Telephone Encounter (Signed)
 Pharmacy Patient Advocate Encounter   Received notification from CoverMyMeds that prior authorization for BREZTRI  is required/requested.   Insurance verification completed.   The patient is insured through [facility] MEDICAID .   Per test claim: PA required; PA submitted to above mentioned insurance via CoverMyMeds Key/confirmation #/EOC [id number] Status is pending

## 2023-02-12 NOTE — Telephone Encounter (Signed)
 Pharmacy Patient Advocate Encounter  Received notification from [facility] MEDICAID that Prior Authorization for BREZTRI has been APPROVED from 02/12/2023 to 02/12/2024   PA #/Case ID/Reference #: [id number]

## 2023-02-18 ENCOUNTER — Encounter: Payer: Self-pay | Admitting: Nurse Practitioner

## 2023-02-18 ENCOUNTER — Ambulatory Visit: Payer: Medicaid Other | Attending: Nurse Practitioner | Admitting: Nurse Practitioner

## 2023-02-18 VITALS — BP 142/91 | HR 93 | Resp 20 | Ht 64.0 in | Wt 272.8 lb

## 2023-02-18 DIAGNOSIS — J4489 Other specified chronic obstructive pulmonary disease: Secondary | ICD-10-CM | POA: Diagnosis not present

## 2023-02-18 DIAGNOSIS — Z23 Encounter for immunization: Secondary | ICD-10-CM

## 2023-02-18 DIAGNOSIS — Z139 Encounter for screening, unspecified: Secondary | ICD-10-CM | POA: Diagnosis not present

## 2023-02-18 DIAGNOSIS — Z6841 Body Mass Index (BMI) 40.0 and over, adult: Secondary | ICD-10-CM | POA: Diagnosis not present

## 2023-02-18 DIAGNOSIS — D649 Anemia, unspecified: Secondary | ICD-10-CM | POA: Diagnosis not present

## 2023-02-18 DIAGNOSIS — E66813 Obesity, class 3: Secondary | ICD-10-CM

## 2023-02-18 DIAGNOSIS — I10 Essential (primary) hypertension: Secondary | ICD-10-CM

## 2023-02-18 MED ORDER — WEGOVY 0.25 MG/0.5ML ~~LOC~~ SOAJ: 0.5000 mg | SUBCUTANEOUS | 1 refills | Status: DC

## 2023-02-18 MED ORDER — PULSE OXIMETER FOR FINGER MISC: 0 refills | Status: DC

## 2023-02-18 NOTE — Progress Notes (Unsigned)
Assessment & Plan:  [name] was seen today for personal care services .  Diagnoses and all orders for this visit:  Essential hypertension Continue carvedilol, hydrochlorothiazide, lisinopril, and amlodipine as prescribed -     CMP14+EGFR Reminded to bring in blood pressure log for follow  up appointment.  RECOMMENDATIONS: DASH/Mediterranean Diets are healthier choices for HTN.     Morbid obesity with BMI of 45.0-49.9, adult (HCC) -     Semaglutide-Weight Management (WEGOVY) 0.25 MG/0.5ML SOAJ; Inject 0.5 mg into the skin once a week  Asthma-COPD overlap syndrome  PCS forms filled out -     Misc. Devices (PULSE OXIMETER FOR FINGER) MISC; Use as instructed J44.89  Anemia, unspecified type -     CBC with Differential  Influenza vaccine administered -     Flu vaccine trivalent PF, 6mos and older(Flulaval,Afluria,Fluarix,Fluzone)  Encounter for screening involving social determinants of health (SDoH) -     AMB Referral [facility] Care Management    Patient has been counseled on age-appropriate routine health concerns for screening and prevention. These are reviewed and up-to-date. Referrals have been placed accordingly. Immunizations are up-to-date or declined.    Subjective:   Chief Complaint  Patient presents with   Personal Care Services     Patient is in office today to request PCS.    Teresa Mendez 48 y.o. female presents to office today requesting personal care services.   She has a past medical history of Asthma, Hypertension, Sarcoidosis, Sickle cell trait, and Stress incontinence.   She Stopped smoking many years ago.   Blood pressure is elevated today. States she ate chitterlings yesterday and last night. BP Readings from Last 3 Encounters:  02/18/23 (!) 142/91  08/29/22 (!) 163/87  09/08/21 (!) 156/100     Due to her COPD and chronic shortness of breath she has difficulty bathing, dressing, cooking and grooming. Minimal activities cause her to be fatigued and  winded.   Review of Systems  Constitutional:  Negative for fever, malaise/fatigue and weight loss.  HENT: Negative.  Negative for nosebleeds.   Eyes: Negative.  Negative for blurred vision, double vision and photophobia.  Respiratory:  Positive for shortness of breath (chronic). Negative for cough.   Cardiovascular: Negative.  Negative for chest pain, palpitations and leg swelling.  Gastrointestinal: Negative.  Negative for heartburn, nausea and vomiting.  Musculoskeletal: Negative.  Negative for myalgias.  Neurological: Negative.  Negative for dizziness, focal weakness, seizures and headaches.  Psychiatric/Behavioral: Negative.  Negative for suicidal ideas.     Past Medical History:  Diagnosis Date   Asthma    Hypertension    Sarcoidosis    Sickle cell trait (HCC)    Stress incontinence     Past Surgical History:  Procedure Laterality Date   BREAST SURGERY Right    biopsy   TUBAL LIGATION      Family History  Problem Relation Age of Onset   Hypertension Mother    Congestive Heart Failure Mother    Kidney failure Mother    Sickle cell trait Mother    Hypertension Father    Congestive Heart Failure Father    Kidney failure Father    Glaucoma Father    Hypertension Brother    Liver disease Brother    Sickle cell trait Son    Sickle cell trait Daughter    Sickle cell trait Daughter     Social History Reviewed with no changes to be made today.   Outpatient Medications Prior to Visit  Medication Sig Dispense Refill   acetaminophen-codeine (TYLENOL #3) 300-30 MG tablet Take 1-2 tablets by mouth every 4 (four) hours as needed for moderate pain. 60 tablet 0   albuterol (PROVENTIL) (2.5 MG/3ML) 0.083% nebulizer solution TAKE 3 MLS (2.5 MG TOTAL) BY NEBULIZATION EVERY 6 (SIX) HOURS AS NEEDED FOR WHEEZING OR SHORTNESS OF BREATH. 150 mL 0   albuterol (PROVENTIL) (2.5 MG/3ML) 0.083% nebulizer solution TAKE 3 MLS (2.5 MG TOTAL) BY NEBULIZATION EVERY 6 (SIX) HOURS AS NEEDED FOR  WHEEZING OR SHORTNESS OF BREATH. 150 mL 11   albuterol (VENTOLIN HFA) 108 (90 Base) MCG/ACT inhaler Inhale 1-2 puffs into the lungs every 4 (four) hours as needed for wheezing or shortness of breath. 126 g 1   amLODipine (NORVASC) 10 MG tablet Take 1 tablet (10 mg total) by mouth daily. 90 tablet 1   aspirin EC 81 MG tablet Take 81 mg by mouth daily.     atorvastatin (LIPITOR) 80 MG tablet Take 1 tablet (80 mg total) by mouth daily. 90 tablet 1   Blood Pressure Monitor DEVI Please provide patient with insurance approved blood pressure monitor I10.0 1 each 0   carvedilol (COREG) 6.25 MG tablet Take 1 tablet (6.25 mg total) by mouth 2 (two) times daily with a meal. 180 tablet 1   cetirizine (ZYRTEC) 10 MG tablet Take 1 tablet (10 mg total) by mouth daily. 90 tablet 1   diclofenac Sodium (VOLTAREN) 1 % GEL Apply 2 g topically 4 (four) times daily. 200 g 1   DULoxetine (CYMBALTA) 60 MG capsule Take 1 capsule (60 mg total) by mouth daily. 90 capsule 1   famotidine (PEPCID) 20 MG tablet Take 1 tablet (20 mg total) by mouth at bedtime. 90 tablet 1   fluticasone (FLONASE) 50 MCG/ACT nasal spray Place 2 sprays into both nostrils daily. 16 g 2   hydrochlorothiazide (HYDRODIURIL) 25 MG tablet Take 1 tablet (25 mg total) by mouth daily. 90 tablet 1   lisinopril (ZESTRIL) 40 MG tablet Take 1 tablet (40 mg total) by mouth daily. 90 tablet 1   Masks MISC Nebulizer face mask J45.909 1 each 0   montelukast (SINGULAIR) 10 MG tablet Take 1 tablet (10 mg total) by mouth at bedtime. 90 tablet 1   olopatadine (CVS OLOPATADINE HCL) 0.1 % ophthalmic solution Place 1 drop into both eyes 2 (two) times daily. 5 mL 12   omeprazole (PRILOSEC) 20 MG capsule Take 1 capsule (20 mg total) by mouth daily. 90 capsule 1   OXYGEN Inhale into the lungs. 3 liters     potassium chloride SA (KLOR-CON M) 20 MEQ tablet Take 1 tablet (20 mEq total) by mouth daily. 90 tablet 1   predniSONE (DELTASONE) 20 MG tablet Take 2 tablets (40 mg  total) by mouth daily with breakfast. 3 tablet 0   triamcinolone ointment (KENALOG) 0.1 % Apply 1 Application topically 2 (two) times daily. 60 g 3   Budeson-Glycopyrrol-Formoterol (BREZTRI AEROSPHERE) 160-9-4.8 MCG/ACT AERO Inhale 2 puffs into the lungs in the morning and at bedtime. 10.7 g 11   Misc. Devices (PULSE OXIMETER FOR FINGER) MISC Use as instructed J44.89 1 each 0   No facility-administered medications prior to visit.    No Known Allergies     Objective:    BP (!) 142/91 (BP Location: Right Arm, Patient Position: Sitting, Cuff Size: Large)   Pulse 93   Resp 20   Ht 5\' 4"  (1.626 m)   Wt 272 lb 12.8 oz (123.7 kg)  SpO2 99% Comment: Portable O2  BMI 46.83 kg/m  Wt Readings from Last 3 Encounters:  02/18/23 272 lb 12.8 oz (123.7 kg)  08/29/22 255 lb 12.8 oz (116 kg)  09/08/21 264 lb 6.4 oz (119.9 kg)    Physical Exam Vitals and nursing note reviewed.  Constitutional:      Appearance: She is well-developed.  HENT:     Head: Normocephalic and atraumatic.  Cardiovascular:     Rate and Rhythm: Normal rate and regular rhythm.     Heart sounds: Normal heart sounds. No murmur heard.    No friction rub. No gallop.  Pulmonary:     Effort: Pulmonary effort is normal. No tachypnea or respiratory distress.     Breath sounds: Normal breath sounds. No decreased breath sounds, wheezing, rhonchi or rales.  Chest:     Chest wall: No tenderness.  Abdominal:     General: Bowel sounds are normal.     Palpations: Abdomen is soft.  Musculoskeletal:        General: Normal range of motion.     Cervical back: Normal range of motion.  Skin:    General: Skin is warm and dry.  Neurological:     Mental Status: She is alert and oriented to person, place, and time.     Coordination: Coordination normal.  Psychiatric:        Behavior: Behavior normal. Behavior is cooperative.        Thought Content: Thought content normal.        Judgment: Judgment normal.          Patient has  been counseled extensively about nutrition and exercise as well as the importance of adherence with medications and regular follow-up. The patient was given clear instructions to go to ER or return to medical center if symptoms don't improve, worsen or new problems develop. The patient verbalized understanding.   Follow-up: Return in about 4 months (around 06/18/2023).   Claiborne Rigg, FNP-BC [facility] and [facility] [place], [place] [phone]   02/21/2023, 9:29 PM

## 2023-02-19 ENCOUNTER — Telehealth: Payer: Self-pay

## 2023-02-19 ENCOUNTER — Other Ambulatory Visit: Payer: Self-pay | Admitting: Nurse Practitioner

## 2023-02-19 ENCOUNTER — Other Ambulatory Visit: Payer: Self-pay

## 2023-02-19 ENCOUNTER — Telehealth: Payer: Self-pay | Admitting: Nurse Practitioner

## 2023-02-19 DIAGNOSIS — J849 Interstitial pulmonary disease, unspecified: Secondary | ICD-10-CM

## 2023-02-19 LAB — CMP14+EGFR
ALT: 22 [IU]/L (ref 0–32)
AST: 18 [IU]/L (ref 0–40)
Albumin: 4.2 g/dL (ref 3.9–4.9)
Alkaline Phosphatase: 115 [IU]/L (ref 44–121)
BUN/Creatinine Ratio: 14 (ref 9–23)
BUN: 12 mg/dL (ref 6–24)
Bilirubin Total: 0.4 mg/dL (ref 0.0–1.2)
CO2: 29 mmol/L (ref 20–29)
Calcium: 9.3 mg/dL (ref 8.7–10.2)
Chloride: 98 mmol/L (ref 96–106)
Creatinine, Ser: 0.84 mg/dL (ref 0.57–1.00)
Globulin, Total: 3 g/dL (ref 1.5–4.5)
Glucose: 118 mg/dL — ABNORMAL HIGH (ref 70–99)
Potassium: 3.7 mmol/L (ref 3.5–5.2)
Sodium: 143 mmol/L (ref 134–144)
Total Protein: 7.2 g/dL (ref 6.0–8.5)
eGFR: 85 mL/min/{1.73_m2} (ref >59)

## 2023-02-19 LAB — CBC WITH DIFFERENTIAL/PLATELET
Basophils Absolute: 0 10*3/uL (ref 0.0–0.2)
Basos: 0 %
EOS (ABSOLUTE): 0.2 10*3/uL (ref 0.0–0.4)
Eos: 2 %
Hematocrit: 37.6 % (ref 34.0–46.6)
Hemoglobin: 11.7 g/dL (ref 11.1–15.9)
Immature Grans (Abs): 0 10*3/uL (ref 0.0–0.1)
Immature Granulocytes: 0 %
Lymphocytes Absolute: 2.1 10*3/uL (ref 0.7–3.1)
Lymphs: 22 %
MCH: 25 pg — ABNORMAL LOW (ref 26.6–33.0)
MCHC: 31.1 g/dL — ABNORMAL LOW (ref 31.5–35.7)
MCV: 80 fL (ref 79–97)
Monocytes Absolute: 0.7 10*3/uL (ref 0.1–0.9)
Monocytes: 7 %
Neutrophils Absolute: 6.6 10*3/uL (ref 1.4–7.0)
Neutrophils: 69 %
Platelets: 368 10*3/uL (ref 150–450)
RBC: 4.68 x10E6/uL (ref 3.77–5.28)
RDW: 13.5 % (ref 11.7–15.4)
WBC: 9.6 10*3/uL (ref 3.4–10.8)

## 2023-02-19 MED ORDER — BREZTRI AEROSPHERE 160-9-4.8 MCG/ACT IN AERO: 2.0000 | INHALATION_SPRAY | Freq: Two times a day (BID) | RESPIRATORY_TRACT | 11 refills | Status: AC

## 2023-02-19 NOTE — Telephone Encounter (Signed)
Noted . please see prior notes

## 2023-02-19 NOTE — Telephone Encounter (Signed)
Patient and Teresa Mendez from [facility] called to f/u on PA for Home Health Services. Please f/u with [employer].

## 2023-02-19 NOTE — Telephone Encounter (Signed)
TE received from [facility] agent. Patient and [facility] representative on the phone requesting PA for personal care assist. [facility] representative disconnected call before I could speak with them for clarification .  Call placed to patient . Patient is requesting a personal care assistance . Advised patient that we will send the necessary paperwork to [facility] .  Please if [facility]  representative calls back let them know that the paperwork in  being completed and no PA is needed.

## 2023-02-19 NOTE — Telephone Encounter (Signed)
Pt states Teresa Mendez just called her about a prior [place].  Pt gave her what she thought was [facility], but it was not.  The correct number is [phone]

## 2023-02-19 NOTE — Telephone Encounter (Signed)
Pharmacy Patient Advocate Encounter   Received notification from CoverMyMeds that prior authorization for [facility] is required/requested.   Insurance verification completed.   The patient is insured through [facility] .   Per test claim: PA required; PA submitted to above mentioned insurance via CoverMyMeds Key/confirmation #/EOC [id number] Status is pending

## 2023-02-19 NOTE — Telephone Encounter (Signed)
Call returned, informed her that PA has been submitted. Waiting for outcome.

## 2023-02-20 ENCOUNTER — Other Ambulatory Visit: Payer: Self-pay

## 2023-02-21 ENCOUNTER — Encounter: Payer: Self-pay | Admitting: Nurse Practitioner

## 2023-02-22 ENCOUNTER — Other Ambulatory Visit: Payer: Self-pay

## 2023-02-22 ENCOUNTER — Telehealth: Payer: Self-pay

## 2023-02-22 NOTE — Telephone Encounter (Signed)
Copied from CRM [id number]. Topic: Clinical - Prescription Issue >> Feb 22, 2023  2:52 PM Fuller Mandril wrote: Reason for CRM: Pharmacy called states Semaglutide-Weight Management (WEGOVY) 0.25 MG/0.5ML SOAJ required prior authorization. Thank You

## 2023-02-22 NOTE — Telephone Encounter (Signed)
Noted

## 2023-02-25 ENCOUNTER — Other Ambulatory Visit: Payer: Self-pay

## 2023-02-27 ENCOUNTER — Encounter: Payer: Self-pay | Admitting: Nurse Practitioner

## 2023-02-27 ENCOUNTER — Other Ambulatory Visit: Payer: Self-pay

## 2023-02-27 NOTE — Telephone Encounter (Signed)
If you could that would be great. If it is not approved after this I will send her to [facility] for referral. Thanks Tresa Endo

## 2023-02-27 NOTE — Telephone Encounter (Signed)
Please let her know it was denied twice already> we will resubmit. If not approved she will need to be referred to [facility].

## 2023-02-27 NOTE — Telephone Encounter (Signed)
Patient identified by name and date of birth.   Patient aware of response and voiced understanding.

## 2023-02-28 ENCOUNTER — Other Ambulatory Visit: Payer: Self-pay

## 2023-03-02 DIAGNOSIS — R32 Unspecified urinary incontinence: Secondary | ICD-10-CM | POA: Diagnosis not present

## 2023-03-02 DIAGNOSIS — J849 Interstitial pulmonary disease, unspecified: Secondary | ICD-10-CM | POA: Diagnosis not present

## 2023-03-02 DIAGNOSIS — J189 Pneumonia, unspecified organism: Secondary | ICD-10-CM | POA: Diagnosis not present

## 2023-03-02 DIAGNOSIS — J45909 Unspecified asthma, uncomplicated: Secondary | ICD-10-CM | POA: Diagnosis not present

## 2023-03-02 DIAGNOSIS — D869 Sarcoidosis, unspecified: Secondary | ICD-10-CM | POA: Diagnosis not present

## 2023-03-02 DIAGNOSIS — I10 Essential (primary) hypertension: Secondary | ICD-10-CM | POA: Diagnosis not present

## 2023-03-02 DIAGNOSIS — J439 Emphysema, unspecified: Secondary | ICD-10-CM | POA: Diagnosis not present

## 2023-03-04 ENCOUNTER — Other Ambulatory Visit: Payer: Self-pay

## 2023-03-04 ENCOUNTER — Other Ambulatory Visit: Payer: Self-pay | Admitting: Nurse Practitioner

## 2023-03-04 ENCOUNTER — Encounter: Payer: Self-pay | Admitting: Pulmonary Disease

## 2023-03-04 ENCOUNTER — Ambulatory Visit (INDEPENDENT_AMBULATORY_CARE_PROVIDER_SITE_OTHER): Payer: Medicaid Other | Admitting: Pulmonary Disease

## 2023-03-04 VITALS — BP 138/78 | HR 60 | Ht 64.0 in | Wt 269.8 lb

## 2023-03-04 DIAGNOSIS — M170 Bilateral primary osteoarthritis of knee: Secondary | ICD-10-CM

## 2023-03-04 DIAGNOSIS — J9611 Chronic respiratory failure with hypoxia: Secondary | ICD-10-CM | POA: Diagnosis not present

## 2023-03-04 DIAGNOSIS — J4489 Other specified chronic obstructive pulmonary disease: Secondary | ICD-10-CM

## 2023-03-04 NOTE — Patient Instructions (Signed)
Nice to see you again  I am glad you are doing relatively well  Continue Breztri 2 puffs twice a day, use albuterol 2 puffs as needed every 4-6 hours for shortness of breath or wheeze  Continue oxygen, goal is 88% or higher.  Okay to decrease the flow rate and see how low you can go to keep your oxygen 88% or higher.  If it drops low 88% you need to increase the oxygen back to where it was.  Return to clinic in 6 months or sooner as needed with Dr. Judeth Horn

## 2023-03-04 NOTE — Progress Notes (Addendum)
Synopsis: Referred in 2021 for asthma by Claiborne Rigg, NP. Formerly a patient of Dr. Shelle Iron in 2009.  Subjective:   PATIENT ID: Teresa Mendez GENDER: female DOB: 1975/09/17, MRN: [id number]  Chief Complaint  Patient presents with   Follow-up    Pt states she's been having some pain in LT lower back shooting , tenderness.    Ms. Harm is a 48 y.o. woman who presents for follow-up of chronic hypoxic respiratory failure due to COPD and sarcoidosis (previously biopsy-proven in 2009).  Also with asthma (sig BD response) that responds well to prednisone, worsens with outdoor activities.  She  continues on  [place].  She still using 3 L supplemental oxygen, no worsening. Quit smoking 2021.   Patient feels okay today.  Lost to follow up.  Lasting 08/2021.  Remains on Breztri for maintenance inhaler.  Appears received dose of prednisone 07/2022.  She denies any recollection of getting prednisone or exacerbations in interim since last seen.  No complaints today.  Has tried to turn her oxygen but notes that her blood pressure increases with this.  We discussed role for attempted to decrease oxygen goal oxygen saturation 88%.  She expressed understanding.  We discussed lung transplant briefly.  Gauging attitudes.  Most obvious barrier currently is weight, BMI currently 46.  She states she is trying to lose weight.  She was encouraged to do so.   Past Medical History:  Diagnosis Date   Asthma    Hypertension    Sarcoidosis    Sickle cell trait (HCC)    Stress incontinence      Family History  Problem Relation Age of Onset   Hypertension Mother    Congestive Heart Failure Mother    Kidney failure Mother    Sickle cell trait Mother    Hypertension Father    Congestive Heart Failure Father    Kidney failure Father    Glaucoma Father    Hypertension Brother    Liver disease Brother    Sickle cell trait Son    Sickle cell trait Daughter    Sickle cell trait Daughter      Past  Surgical History:  Procedure Laterality Date   BREAST SURGERY Right    biopsy   TUBAL LIGATION      Social History   Socioeconomic History   Marital status: Single    Spouse name: Not on file   Number of children: Not on file   Years of education: Not on file   Highest education level: Some college, no degree  Occupational History   Not on file  Tobacco Use   Smoking status: Former    Current packs/day: 0.00    Types: Cigarettes    Quit date: 01/02/2019    Years since quitting: 4.1    Passive exposure: Current   Smokeless tobacco: Never  Vaping Use   Vaping status: Former  Substance and Sexual Activity   Alcohol use: Not Currently    Comment: Occasionally    Drug use: No   Sexual activity: Not Currently  Other Topics Concern   Not on file  Social History Narrative   Not on file   Social Drivers of Health   Financial Resource Strain: Low Risk  (02/18/2023)   Overall Financial Resource Strain (CARDIA)    Difficulty of Paying Living Expenses: Not hard at all  Food Insecurity: Food Insecurity Present (02/18/2023)   Hunger Vital Sign    Worried About Programme researcher, broadcasting/film/video in  the Last Year: Sometimes true    Ran Out of Food in the Last Year: Sometimes true  Transportation Needs: No Transportation Needs (02/18/2023)   PRAPARE - Administrator, Civil Service (Medical): No    Lack of Transportation (Non-Medical): No  Physical Activity: Inactive (02/18/2023)   Exercise Vital Sign    Days of Exercise per Week: 0 days    Minutes of Exercise per Session: 0 min  Stress: No Stress Concern Present (02/18/2023)   [employer] of Occupational Health - Occupational Stress Questionnaire    Feeling of Stress : Not at all  Social Connections: Moderately Integrated (02/18/2023)   Social Connection and Isolation Panel [NHANES]    Frequency of Communication with Friends and Family: More than three times a week    Frequency of Social Gatherings with Friends and Family: More  than three times a week    Attends Religious Services: 1 to 4 times per year    Active Member of [employer] or Organizations: Yes    Attends Banker Meetings: 1 to 4 times per year    Marital Status: Divorced  Catering manager Violence: Not At Risk (02/18/2023)   Humiliation, Afraid, Rape, and Kick questionnaire    Fear of Current or Ex-Partner: No    Emotionally Abused: No    Physically Abused: No    Sexually Abused: No     No Known Allergies   Immunization History  Administered Date(s) Administered   Influenza, Seasonal, Injecte, Preservative Fre 02/18/2023   Influenza,inj,Quad PF,6+ Mos 10/13/2019   PFIZER(Purple Top)SARS-COV-2 Vaccination 05/21/2020, 06/16/2020    Outpatient Medications Prior to Visit  Medication Sig Dispense Refill   acetaminophen-codeine (TYLENOL #3) 300-30 MG tablet Take 1-2 tablets by mouth every 4 (four) hours as needed for moderate pain. 60 tablet 0   albuterol (PROVENTIL) (2.5 MG/3ML) 0.083% nebulizer solution TAKE 3 MLS (2.5 MG TOTAL) BY NEBULIZATION EVERY 6 (SIX) HOURS AS NEEDED FOR WHEEZING OR SHORTNESS OF BREATH. 150 mL 0   albuterol (PROVENTIL) (2.5 MG/3ML) 0.083% nebulizer solution TAKE 3 MLS (2.5 MG TOTAL) BY NEBULIZATION EVERY 6 (SIX) HOURS AS NEEDED FOR WHEEZING OR SHORTNESS OF BREATH. 150 mL 11   albuterol (VENTOLIN HFA) 108 (90 Base) MCG/ACT inhaler Inhale 1-2 puffs into the lungs every 4 (four) hours as needed for wheezing or shortness of breath. 126 g 1   amLODipine (NORVASC) 10 MG tablet Take 1 tablet (10 mg total) by mouth daily. 90 tablet 1   aspirin EC 81 MG tablet Take 81 mg by mouth daily.     atorvastatin (LIPITOR) 80 MG tablet Take 1 tablet (80 mg total) by mouth daily. 90 tablet 1   Blood Pressure Monitor DEVI Please provide patient with insurance approved blood pressure monitor I10.0 1 each 0   Budeson-Glycopyrrol-Formoterol (BREZTRI AEROSPHERE) 160-9-4.8 MCG/ACT AERO Inhale 2 puffs into the lungs in the morning and at bedtime.  10.7 g 11   carvedilol (COREG) 6.25 MG tablet Take 1 tablet (6.25 mg total) by mouth 2 (two) times daily with a meal. 180 tablet 1   cetirizine (ZYRTEC) 10 MG tablet Take 1 tablet (10 mg total) by mouth daily. 90 tablet 1   diclofenac Sodium (VOLTAREN) 1 % GEL Apply 2 g topically 4 (four) times daily. 200 g 1   DULoxetine (CYMBALTA) 60 MG capsule Take 1 capsule (60 mg total) by mouth daily. 90 capsule 1   famotidine (PEPCID) 20 MG tablet Take 1 tablet (20 mg total) by mouth at  bedtime. 90 tablet 1   fluticasone (FLONASE) 50 MCG/ACT nasal spray Place 2 sprays into both nostrils daily. 16 g 2   hydrochlorothiazide (HYDRODIURIL) 25 MG tablet Take 1 tablet (25 mg total) by mouth daily. 90 tablet 1   lisinopril (ZESTRIL) 40 MG tablet Take 1 tablet (40 mg total) by mouth daily. 90 tablet 1   Masks MISC Nebulizer face mask J45.909 1 each 0   Misc. Devices (PULSE OXIMETER FOR FINGER) MISC Use as instructed J44.89 1 each 0   montelukast (SINGULAIR) 10 MG tablet Take 1 tablet (10 mg total) by mouth at bedtime. 90 tablet 1   olopatadine (CVS OLOPATADINE HCL) 0.1 % ophthalmic solution Place 1 drop into both eyes 2 (two) times daily. 5 mL 12   omeprazole (PRILOSEC) 20 MG capsule Take 1 capsule (20 mg total) by mouth daily. 90 capsule 1   OXYGEN Inhale into the lungs. 3 liters     potassium chloride SA (KLOR-CON M) 20 MEQ tablet Take 1 tablet (20 mEq total) by mouth daily. 90 tablet 1   predniSONE (DELTASONE) 20 MG tablet Take 2 tablets (40 mg total) by mouth daily with breakfast. 3 tablet 0   Semaglutide-Weight Management (WEGOVY) 0.25 MG/0.5ML SOAJ Inject 0.5 mg into the skin once a week. 2 mL 1   triamcinolone ointment (KENALOG) 0.1 % Apply 1 Application topically 2 (two) times daily. 60 g 3   No facility-administered medications prior to visit.      Objective:   Vitals:   03/04/23 1536  BP: 138/78  Pulse: 60  SpO2: 96%  Weight: 269 lb 12.8 oz (122.4 kg)  Height: 5\' 4"  (1.626 m)   96% on 3  LPM    (83% on RA) BMI Readings from Last 3 Encounters:  03/04/23 46.31 kg/m  02/18/23 46.83 kg/m  08/29/22 43.91 kg/m   Wt Readings from Last 3 Encounters:  03/04/23 269 lb 12.8 oz (122.4 kg)  02/18/23 272 lb 12.8 oz (123.7 kg)  08/29/22 255 lb 12.8 oz (116 kg)    Physical exam: General: Well-appearing, no acute distress Eyes: EOMI, no icterus Pulmonary: Distant sounds, clear to auscultation bilaterally, normal work of breathing on nasal cannula Cardiovascular: Regular rate, regular rhythm  CBC    Component Value Date/Time   WBC 9.6 02/18/2023 1140   WBC 11.2 (H) 11/27/2019 0117   RBC 4.68 02/18/2023 1140   RBC 4.08 11/27/2019 0117   HGB 11.7 02/18/2023 1140   HCT 37.6 02/18/2023 1140   PLT 368 02/18/2023 1140   MCV 80 02/18/2023 1140   MCH 25.0 (L) 02/18/2023 1140   MCH 25.5 (L) 11/27/2019 0117   MCHC 31.1 (L) 02/18/2023 1140   MCHC 31.0 11/27/2019 0117   RDW 13.5 02/18/2023 1140   LYMPHSABS 2.1 02/18/2023 1140   MONOABS 0.4 05/22/2019 0702   EOSABS 0.2 02/18/2023 1140   BASOSABS 0.0 02/18/2023 1140    CHEMISTRY No results for input(s): "NA", "K", "CL", "CO2", "GLUCOSE", "BUN", "CREATININE", "CALCIUM", "MG", "PHOS" in the last 168 hours. Estimated Creatinine Clearance: 104.6 mL/min (by C-G formula based on SCr of 0.84 mg/dL).   HIV negative 04/2019 A1AT WNL  CRP 1.3 (WNL) on 07/07/19 ESR 40 on 07/07/19  Chest Imaging- films reviewed: CT scan reviewed- Paraseptal and centrilobular emphysema with blebs. Possibly increased GGO throughout remaining lung tissue. No PE.  Pulmonary Functions Testing Results:    Latest Ref Rng & Units 07/03/2019    2:52 PM  PFT Results  FVC-Pre L 1.89  FVC-Predicted Pre % 60   FVC-Post L 2.41   FVC-Predicted Post % 76   Pre FEV1/FVC % % 47   Post FEV1/FCV % % 50   FEV1-Pre L 0.89   FEV1-Predicted Pre % 34   FEV1-Post L 1.20   DLCO uncorrected ml/min/mmHg 17.25   DLCO UNC% % 76   DLCO corrected ml/min/mmHg 19.39   DLCO COR  %Predicted % 86   DLVA Predicted % 116   TLC L 4.65   TLC % Predicted % 88   RV % Predicted % 140    2021- severe obstruction with significant bronchodilator reversibility.  Air trapping without hyperinflation or restriction.  Mild diffusion impairment.  Flow volume loop supports mixed obstruction and restriction.  Echocardiogram 05/18/19: LVEF 60-65%, mild LVH, indeterminate diastolic function. Mildly dilated LA. Normal RV function. Trivial AI. Ascending aortic aneurysm.      Assessment & Plan:     ICD-10-CM   1. Chronic respiratory failure with hypoxia (HCC)  J96.11     2. Asthma-COPD overlap syndrome (HCC)  J44.89          Chronic hypoxic respiratory failure- likely multifactorial- sarcoid, COPD/emphysema, asthma:  -Continue Breztri, symptoms stable -- Briefly discussed lung transplant today  Sarcoid: Burned-out, no evidence of disease on prior imaging.  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     Current Outpatient Medications:    acetaminophen-codeine (TYLENOL #3) 300-30 MG tablet, Take 1-2 tablets by mouth every 4 (four) hours as needed for moderate pain., Disp: 60 tablet, Rfl: 0   albuterol (PROVENTIL) (2.5 MG/3ML) 0.083% nebulizer solution, TAKE 3 MLS (2.5 MG TOTAL) BY NEBULIZATION EVERY 6 (SIX) HOURS AS NEEDED FOR WHEEZING OR SHORTNESS OF BREATH., Disp: 150 mL, Rfl: 0   albuterol (PROVENTIL) (2.5 MG/3ML) 0.083% nebulizer solution, TAKE 3 MLS (2.5 MG TOTAL) BY NEBULIZATION EVERY 6 (SIX) HOURS AS NEEDED FOR WHEEZING OR SHORTNESS OF BREATH., Disp: 150 mL, Rfl: 11   albuterol (VENTOLIN HFA) 108 (90 Base) MCG/ACT inhaler, Inhale 1-2 puffs into the lungs every 4 (four) hours as needed for wheezing or shortness of breath., Disp: 126 g, Rfl: 1   amLODipine (NORVASC) 10 MG tablet, Take 1 tablet (10 mg total)  by mouth daily., Disp: 90 tablet, Rfl: 1   aspirin EC 81 MG tablet, Take 81 mg by mouth daily., Disp: , Rfl:    atorvastatin (LIPITOR) 80 MG tablet, Take 1 tablet (80 mg total) by mouth daily., Disp: 90 tablet, Rfl: 1   Blood Pressure Monitor DEVI, Please provide patient with insurance approved blood pressure monitor I10.0, Disp: 1 each, Rfl: 0   Budeson-Glycopyrrol-Formoterol (BREZTRI AEROSPHERE) 160-9-4.8 MCG/ACT AERO, Inhale 2 puffs into the lungs in the morning and at bedtime., Disp: 10.7 g, Rfl: 11   carvedilol (COREG) 6.25 MG tablet, Take 1 tablet (6.25 mg total) by mouth 2 (two) times daily with a meal., Disp: 180 tablet, Rfl: 1   cetirizine (ZYRTEC) 10 MG tablet, Take 1 tablet (10 mg total) by mouth daily., Disp: 90 tablet, Rfl: 1   diclofenac Sodium (VOLTAREN) 1 % GEL, Apply 2 g topically 4 (four) times daily., Disp: 200 g, Rfl: 1   DULoxetine (CYMBALTA) 60 MG capsule, Take 1 capsule (60 mg total) by mouth daily., Disp: 90 capsule, Rfl: 1   famotidine (PEPCID) 20 MG tablet, Take 1 tablet (20 mg total) by mouth at bedtime., Disp: 90  tablet, Rfl: 1   fluticasone (FLONASE) 50 MCG/ACT nasal spray, Place 2 sprays into both nostrils daily., Disp: 16 g, Rfl: 2   hydrochlorothiazide (HYDRODIURIL) 25 MG tablet, Take 1 tablet (25 mg total) by mouth daily., Disp: 90 tablet, Rfl: 1   lisinopril (ZESTRIL) 40 MG tablet, Take 1 tablet (40 mg total) by mouth daily., Disp: 90 tablet, Rfl: 1   Masks MISC, Nebulizer face mask J45.909, Disp: 1 each, Rfl: 0   Misc. Devices (PULSE OXIMETER FOR FINGER) MISC, Use as instructed J44.89, Disp: 1 each, Rfl: 0   montelukast (SINGULAIR) 10 MG tablet, Take 1 tablet (10 mg total) by mouth at bedtime., Disp: 90 tablet, Rfl: 1   olopatadine (CVS OLOPATADINE HCL) 0.1 % ophthalmic solution, Place 1 drop into both eyes 2 (two) times daily., Disp: 5 mL, Rfl: 12   omeprazole (PRILOSEC) 20 MG capsule, Take 1 capsule (20 mg total) by mouth daily., Disp: 90 capsule, Rfl: 1    OXYGEN, Inhale into the lungs. 3 liters, Disp: , Rfl:    potassium chloride SA (KLOR-CON M) 20 MEQ tablet, Take 1 tablet (20 mEq total) by mouth daily., Disp: 90 tablet, Rfl: 1   predniSONE (DELTASONE) 20 MG tablet, Take 2 tablets (40 mg total) by mouth daily with breakfast., Disp: 3 tablet, Rfl: 0   Semaglutide-Weight Management (WEGOVY) 0.25 MG/0.5ML SOAJ, Inject 0.5 mg into the skin once a week., Disp: 2 mL, Rfl: 1   triamcinolone ointment (KENALOG) 0.1 %, Apply 1 Application topically 2 (two) times daily., Disp: 60 g, Rfl: 3     Karren Burly, MD [facility] 03/04/2023 3:56 PM

## 2023-03-06 ENCOUNTER — Other Ambulatory Visit: Payer: Self-pay

## 2023-03-06 NOTE — Patient Outreach (Signed)
  Medicaid Managed Care   Unsuccessful Outreach Note  03/06/2023 Name: NAMIYAH GRANTHAM MRN: [id number] DOB: [birth year]  Referred by: Theotis Haze ORN, NP Reason for referral : High Risk Managed Medicaid (MM Social work unsuccessful telephone outreach )   An unsuccessful telephone outreach was attempted today. The patient was referred to the case management team for assistance with care management and care coordination.   Follow Up Plan: A HIPAA compliant phone message was left for the patient providing contact information and requesting a return call.   [name], MHA [facility]  Managed [facility] Social Worker [phone]

## 2023-03-06 NOTE — Patient Instructions (Signed)
  Medicaid Managed Care   Unsuccessful Outreach Note  03/06/2023 Name: Teresa Mendez MRN: [id number] DOB: [birth year]  Referred by: Theotis Haze ORN, NP Reason for referral : High Risk Managed Medicaid (MM Social work unsuccessful telephone outreach )   An unsuccessful telephone outreach was attempted today. The patient was referred to the case management team for assistance with care management and care coordination.   Follow Up Plan: A HIPAA compliant phone message was left for the patient providing contact information and requesting a return call.   [name], MHA [facility]  Managed [facility] Social Worker [phone]

## 2023-03-11 DIAGNOSIS — M170 Bilateral primary osteoarthritis of knee: Secondary | ICD-10-CM | POA: Diagnosis not present

## 2023-03-14 ENCOUNTER — Telehealth: Payer: Self-pay

## 2023-03-14 NOTE — Progress Notes (Signed)
Complex Care Management Note  Care Guide Note 03/14/2023 Name: Teresa Mendez MRN: [id number] DOB: 03/16/1975  Teresa Mendez is a 48 y.o. year old female who sees Claiborne Rigg, NP for primary care. I reached out to Teresa Mendez by phone today to offer complex care management services.  [name] was given information about Complex Care Management services today including:   The Complex Care Management services include support from the care team which includes your Nurse Care Manager, Clinical Social Worker, or Pharmacist.  The Complex Care Management team is here to help remove barriers to the health concerns and goals most important to you. Complex Care Management services are voluntary, and the patient may decline or stop services at any time by request to their care team member.   Complex Care Management Consent Status: Patient wishes to consider information provided and/or speak with a member of the care team before deciding to participate in complex care management services.   Follow up plan:  Telephone appointment with complex care management team member scheduled for:  03/28/23 at 1:00  Encounter Outcome:  Patient Scheduled  [facility], [facility] Care Management Assistant Direct Dial: [phone]  Fax: [phone]

## 2023-03-16 DIAGNOSIS — J449 Chronic obstructive pulmonary disease, unspecified: Secondary | ICD-10-CM | POA: Diagnosis not present

## 2023-03-17 DIAGNOSIS — J449 Chronic obstructive pulmonary disease, unspecified: Secondary | ICD-10-CM | POA: Diagnosis not present

## 2023-03-18 DIAGNOSIS — J449 Chronic obstructive pulmonary disease, unspecified: Secondary | ICD-10-CM | POA: Diagnosis not present

## 2023-03-19 DIAGNOSIS — J449 Chronic obstructive pulmonary disease, unspecified: Secondary | ICD-10-CM | POA: Diagnosis not present

## 2023-03-20 DIAGNOSIS — J449 Chronic obstructive pulmonary disease, unspecified: Secondary | ICD-10-CM | POA: Diagnosis not present

## 2023-03-20 NOTE — Telephone Encounter (Signed)
Copied from CRM [id number]. Topic: Clinical - Prescription Issue >> Mar 19, 2023  2:58 PM Payton Doughty wrote: Reason for CRM: Gavin Pound w/ [facility] care member services states if the dr could resubmit the prior auth for the [facility].  It needs the OV notes and life style changes that pt may be doing. Needs to state she I going to maintain her healthy changes Please resubmit  the prior auth  fax  [phone]

## 2023-03-21 ENCOUNTER — Other Ambulatory Visit: Payer: Self-pay

## 2023-03-21 ENCOUNTER — Ambulatory Visit: Payer: Self-pay | Admitting: Nurse Practitioner

## 2023-03-21 DIAGNOSIS — J449 Chronic obstructive pulmonary disease, unspecified: Secondary | ICD-10-CM | POA: Diagnosis not present

## 2023-03-21 NOTE — Telephone Encounter (Signed)
Patient identified by name and date of birth.  Patient aware of denials and referral to be sent.

## 2023-03-21 NOTE — Telephone Encounter (Signed)
Copied from CRM [id number]. Topic: Clinical - Medication Question >> Mar 21, 2023  9:39 AM Louie Casa B wrote: Reason for CRM: patient wants to know if she can get wegovy samples mailed to her please call patient [phone]   Answer Assessment - Initial Assessment Questions 1. REASON FOR CALL or QUESTION: "What is your reason for calling today?" or "How can I best help you?" or "What question do you have that I can help answer?"     Called pt back to gain clarity regarding sample request:  Pt stated currently her PCP has ordered [facility] medication for her.  Pt stated due to pre- authorization needed, at present time, pt has not be able to get medication: pt would like to know if possible, if she could get some Wegovy samples from the office & have those samples mailed to her home until her medication can be filled.  Pt would like a call back on status of this request.Nurse informed pt that this information would be routed to office/PCP.  Protocols used: Information Only Call - No Triage-A-AH

## 2023-03-21 NOTE — Telephone Encounter (Signed)
Thank you I will inform patient.

## 2023-03-22 DIAGNOSIS — J449 Chronic obstructive pulmonary disease, unspecified: Secondary | ICD-10-CM | POA: Diagnosis not present

## 2023-03-23 DIAGNOSIS — J449 Chronic obstructive pulmonary disease, unspecified: Secondary | ICD-10-CM | POA: Diagnosis not present

## 2023-03-24 DIAGNOSIS — J449 Chronic obstructive pulmonary disease, unspecified: Secondary | ICD-10-CM | POA: Diagnosis not present

## 2023-03-25 DIAGNOSIS — J449 Chronic obstructive pulmonary disease, unspecified: Secondary | ICD-10-CM | POA: Diagnosis not present

## 2023-03-26 DIAGNOSIS — J449 Chronic obstructive pulmonary disease, unspecified: Secondary | ICD-10-CM | POA: Diagnosis not present

## 2023-03-27 DIAGNOSIS — Z79899 Other long term (current) drug therapy: Secondary | ICD-10-CM | POA: Diagnosis not present

## 2023-03-27 DIAGNOSIS — E7800 Pure hypercholesterolemia, unspecified: Secondary | ICD-10-CM | POA: Diagnosis not present

## 2023-03-27 DIAGNOSIS — M25562 Pain in left knee: Secondary | ICD-10-CM | POA: Diagnosis not present

## 2023-03-27 DIAGNOSIS — M25561 Pain in right knee: Secondary | ICD-10-CM | POA: Diagnosis not present

## 2023-03-27 DIAGNOSIS — R0602 Shortness of breath: Secondary | ICD-10-CM | POA: Diagnosis not present

## 2023-03-27 DIAGNOSIS — Z1159 Encounter for screening for other viral diseases: Secondary | ICD-10-CM | POA: Diagnosis not present

## 2023-03-27 DIAGNOSIS — M129 Arthropathy, unspecified: Secondary | ICD-10-CM | POA: Diagnosis not present

## 2023-03-27 DIAGNOSIS — R5383 Other fatigue: Secondary | ICD-10-CM | POA: Diagnosis not present

## 2023-03-27 DIAGNOSIS — J449 Chronic obstructive pulmonary disease, unspecified: Secondary | ICD-10-CM | POA: Diagnosis not present

## 2023-03-27 DIAGNOSIS — E559 Vitamin D deficiency, unspecified: Secondary | ICD-10-CM | POA: Diagnosis not present

## 2023-03-27 DIAGNOSIS — M5442 Lumbago with sciatica, left side: Secondary | ICD-10-CM | POA: Diagnosis not present

## 2023-03-27 DIAGNOSIS — M5441 Lumbago with sciatica, right side: Secondary | ICD-10-CM | POA: Diagnosis not present

## 2023-03-27 DIAGNOSIS — D539 Nutritional anemia, unspecified: Secondary | ICD-10-CM | POA: Diagnosis not present

## 2023-03-27 DIAGNOSIS — Z131 Encounter for screening for diabetes mellitus: Secondary | ICD-10-CM | POA: Diagnosis not present

## 2023-03-28 ENCOUNTER — Other Ambulatory Visit: Payer: Self-pay

## 2023-03-28 DIAGNOSIS — J449 Chronic obstructive pulmonary disease, unspecified: Secondary | ICD-10-CM | POA: Diagnosis not present

## 2023-03-28 NOTE — Patient Instructions (Signed)
 Visit Information  Teresa Mendez was given information about Medicaid Managed Care team care coordination services as a part of their Healthy [facility] Medicaid benefit. Teresa Mendez verbally consented to engagement with the [facility]  Managed Care team.   If you are experiencing a medical emergency, please call 911 or report to your local emergency department or urgent care.   If you have a non-emergency medical problem during routine business hours, please contact your provider's office and ask to speak with a nurse.   For questions related to your Healthy [facility] health plan, please call: [phone] or visit the homepage here: [email or web address]  If you would like to schedule transportation through your Healthy [facility] plan, please call the following number at least 2 days in advance of your appointment: [phone]  For information about your ride after you set it up, call Ride Assist at [phone]. Use this number to activate a Will Call pickup, or if your transportation is late for a scheduled pickup. Use this number, too, if you need to make a change or cancel a previously scheduled reservation.  If you need transportation services right away, call [phone]. The after-hours call center is staffed 24 hours to handle ride assistance and urgent reservation requests (including discharges) 365 days a year. Urgent trips include sick visits, hospital discharge requests and life-sustaining treatment.  Call the [facility] Line at [phone], at any time, 24 hours a day, 7 days a week. If you are in danger or need immediate medical attention call 911.  If you would like help to quit smoking, call 1-800-QUIT-NOW (438-296-9313) OR Espaol: 1-855-Djelo-Ya (4-742-595-6387) o para ms informacin haga clic aqu or Text READY to 564-332 to register via text  Teresa Mendez - following are the goals we discussed in your visit today:   Goals  Addressed   None     Social Worker will follow up in 30 days.  Teresa Mendez, [name], MHA [facility]  Managed Medicaid Social Worker [phone]   Following is a copy of your plan of care:  There are no care plans that you recently modified to display for this patient.

## 2023-03-28 NOTE — Patient Outreach (Signed)
 Medicaid Managed Care Social Work Note  03/28/2023 Name:  Teresa Mendez MRN:  [id number] DOB:  11/11/1975  Teresa Mendez is an 48 y.o. year old female who is a primary Teresa Mendez of Claiborne Rigg, NP.  The Medicaid Managed Care Coordination team was consulted for assistance with:  Community Resources   Teresa Mendez was given information about Medicaid Managed Care Coordination team services today. Estill Batten [place] Teresa Mendez agreed to services and verbal consent obtained.  Engaged with Teresa Mendez  for by telephone forinitial visit in response to referral for case management and/or care coordination services.   Teresa Mendez is participating in a Managed Medicaid Plan:  Yes  Assessments/Interventions:  Review of past medical history, allergies, medications, health status, including review of consultants reports, laboratory and other test data, was performed as part of comprehensive evaluation and provision of chronic care management services.  SDOH: (Social Drivers of Health) assessments and interventions performed: SDOH Interventions    Flowsheet Row Office Visit from 02/18/2023 in [facility] [place] [facility] Dept Of [place]. [facility] Teresa Mendez Outreach Telephone from 11/14/2020 in [facility] Community Care Coordination Teresa Mendez Outreach Telephone from 10/06/2020 in [facility] Community Care Coordination Teresa Mendez Outreach Telephone from 09/07/2020 in [facility] Community Care Coordination Teresa Mendez Outreach Telephone from 07/29/2020 in [facility] Care Coordination  SDOH Interventions       Food Insecurity Interventions AMB Referral Intervention Not Indicated -- -- --  Housing Interventions Intervention Not Indicated Intervention Not Indicated -- -- --  Transportation Interventions Intervention Not Indicated Intervention Not Indicated -- -- --  Utilities Interventions Intervention Not Indicated -- -- -- --  Alcohol Usage  Interventions Intervention Not Indicated (Score <7) -- -- -- --  Depression Interventions/Treatment  -- -- Referral to Psychiatry Referral to Psychiatry Teresa Mendez refuses Treatment  Financial Strain Interventions Intervention Not Indicated -- -- -- --  Physical Activity Interventions Teresa Mendez Declined Intervention Not Indicated, Other (Comments)  [not physically able] -- -- --  Stress Interventions Intervention Not Indicated -- Provide Counseling Provide Counseling --  Social Connections Interventions Intervention Not Indicated -- -- -- --  Health Literacy Interventions AMB Referral -- -- -- --     BSW completed a telephone outreach with Teresa Mendez, she states she needs assistance with food, rent and utilities. Teresa Mendez receives 943 monthly, she does receive foodstamps each month but states it is not enough. BSW and Teresa Mendez agreed for resources to be mailed and emailed to addresses on file.   Advanced Directives Status:  Not addressed in this encounter.  Care Plan                 No Known Allergies  Medications Reviewed Today   Medications were not reviewed in this encounter     Teresa Mendez Active Problem List   Diagnosis Date Noted   Primary osteoarthritis of both knees 09/05/2021   Former smoker 03/24/2020   Breast nodule 03/24/2020   GERD (gastroesophageal reflux disease) 09/07/2019   Ground glass opacity present on imaging of lung 09/07/2019   Interstitial pulmonary disease (HCC) 09/07/2019   Emphysema lung (HCC) 07/03/2019   Abnormal findings on diagnostic imaging of lung 07/03/2019   Myalgia 07/03/2019   Hypertensive urgency 05/18/2019   Chest pain 05/18/2019   Acute respiratory failure with hypoxia (HCC) 05/17/2019   PULMONARY SARCOIDOSIS 07/14/2007   Asthma-COPD overlap syndrome (HCC) 07/14/2007   Essential hypertension 06/27/2007   DYSPNEA 06/27/2007    Conditions to be addressed/monitored  per PCP order:   community resources  There are no care plans that you recently  modified to display for this Teresa Mendez.   Follow up:  Teresa Mendez agrees to Care Plan and Follow-up.  Plan: The Managed Medicaid care management team will reach out to the Teresa Mendez again over the next 30 days.  Date/time of next scheduled Social Work care management/care coordination outreach:  04/30/23  Gus Puma, [name], [facility]  Managed [facility] Social Worker [phone]

## 2023-03-29 DIAGNOSIS — Z79899 Other long term (current) drug therapy: Secondary | ICD-10-CM | POA: Diagnosis not present

## 2023-03-29 DIAGNOSIS — J449 Chronic obstructive pulmonary disease, unspecified: Secondary | ICD-10-CM | POA: Diagnosis not present

## 2023-03-30 DIAGNOSIS — J849 Interstitial pulmonary disease, unspecified: Secondary | ICD-10-CM | POA: Diagnosis not present

## 2023-03-30 DIAGNOSIS — J439 Emphysema, unspecified: Secondary | ICD-10-CM | POA: Diagnosis not present

## 2023-03-30 DIAGNOSIS — D869 Sarcoidosis, unspecified: Secondary | ICD-10-CM | POA: Diagnosis not present

## 2023-03-30 DIAGNOSIS — J449 Chronic obstructive pulmonary disease, unspecified: Secondary | ICD-10-CM | POA: Diagnosis not present

## 2023-03-30 DIAGNOSIS — J189 Pneumonia, unspecified organism: Secondary | ICD-10-CM | POA: Diagnosis not present

## 2023-03-30 DIAGNOSIS — J45909 Unspecified asthma, uncomplicated: Secondary | ICD-10-CM | POA: Diagnosis not present

## 2023-03-31 DIAGNOSIS — J449 Chronic obstructive pulmonary disease, unspecified: Secondary | ICD-10-CM | POA: Diagnosis not present

## 2023-04-01 DIAGNOSIS — J449 Chronic obstructive pulmonary disease, unspecified: Secondary | ICD-10-CM | POA: Diagnosis not present

## 2023-04-02 DIAGNOSIS — J449 Chronic obstructive pulmonary disease, unspecified: Secondary | ICD-10-CM | POA: Diagnosis not present

## 2023-04-03 DIAGNOSIS — J449 Chronic obstructive pulmonary disease, unspecified: Secondary | ICD-10-CM | POA: Diagnosis not present

## 2023-04-04 DIAGNOSIS — J449 Chronic obstructive pulmonary disease, unspecified: Secondary | ICD-10-CM | POA: Diagnosis not present

## 2023-04-05 DIAGNOSIS — J449 Chronic obstructive pulmonary disease, unspecified: Secondary | ICD-10-CM | POA: Diagnosis not present

## 2023-04-06 DIAGNOSIS — J449 Chronic obstructive pulmonary disease, unspecified: Secondary | ICD-10-CM | POA: Diagnosis not present

## 2023-04-07 DIAGNOSIS — J449 Chronic obstructive pulmonary disease, unspecified: Secondary | ICD-10-CM | POA: Diagnosis not present

## 2023-04-08 DIAGNOSIS — J449 Chronic obstructive pulmonary disease, unspecified: Secondary | ICD-10-CM | POA: Diagnosis not present

## 2023-04-09 DIAGNOSIS — J449 Chronic obstructive pulmonary disease, unspecified: Secondary | ICD-10-CM | POA: Diagnosis not present

## 2023-04-10 DIAGNOSIS — J449 Chronic obstructive pulmonary disease, unspecified: Secondary | ICD-10-CM | POA: Diagnosis not present

## 2023-04-11 DIAGNOSIS — J449 Chronic obstructive pulmonary disease, unspecified: Secondary | ICD-10-CM | POA: Diagnosis not present

## 2023-04-12 DIAGNOSIS — J449 Chronic obstructive pulmonary disease, unspecified: Secondary | ICD-10-CM | POA: Diagnosis not present

## 2023-04-13 DIAGNOSIS — J449 Chronic obstructive pulmonary disease, unspecified: Secondary | ICD-10-CM | POA: Diagnosis not present

## 2023-04-14 DIAGNOSIS — J449 Chronic obstructive pulmonary disease, unspecified: Secondary | ICD-10-CM | POA: Diagnosis not present

## 2023-04-15 DIAGNOSIS — J449 Chronic obstructive pulmonary disease, unspecified: Secondary | ICD-10-CM | POA: Diagnosis not present

## 2023-04-16 DIAGNOSIS — J449 Chronic obstructive pulmonary disease, unspecified: Secondary | ICD-10-CM | POA: Diagnosis not present

## 2023-04-17 DIAGNOSIS — J449 Chronic obstructive pulmonary disease, unspecified: Secondary | ICD-10-CM | POA: Diagnosis not present

## 2023-04-18 DIAGNOSIS — J449 Chronic obstructive pulmonary disease, unspecified: Secondary | ICD-10-CM | POA: Diagnosis not present

## 2023-04-19 DIAGNOSIS — J449 Chronic obstructive pulmonary disease, unspecified: Secondary | ICD-10-CM | POA: Diagnosis not present

## 2023-04-20 DIAGNOSIS — J449 Chronic obstructive pulmonary disease, unspecified: Secondary | ICD-10-CM | POA: Diagnosis not present

## 2023-04-21 DIAGNOSIS — J449 Chronic obstructive pulmonary disease, unspecified: Secondary | ICD-10-CM | POA: Diagnosis not present

## 2023-04-22 DIAGNOSIS — J449 Chronic obstructive pulmonary disease, unspecified: Secondary | ICD-10-CM | POA: Diagnosis not present

## 2023-04-23 DIAGNOSIS — J449 Chronic obstructive pulmonary disease, unspecified: Secondary | ICD-10-CM | POA: Diagnosis not present

## 2023-04-24 DIAGNOSIS — J449 Chronic obstructive pulmonary disease, unspecified: Secondary | ICD-10-CM | POA: Diagnosis not present

## 2023-04-25 DIAGNOSIS — J449 Chronic obstructive pulmonary disease, unspecified: Secondary | ICD-10-CM | POA: Diagnosis not present

## 2023-04-26 DIAGNOSIS — J449 Chronic obstructive pulmonary disease, unspecified: Secondary | ICD-10-CM | POA: Diagnosis not present

## 2023-04-28 DIAGNOSIS — J449 Chronic obstructive pulmonary disease, unspecified: Secondary | ICD-10-CM | POA: Diagnosis not present

## 2023-04-29 DIAGNOSIS — J449 Chronic obstructive pulmonary disease, unspecified: Secondary | ICD-10-CM | POA: Diagnosis not present

## 2023-04-30 ENCOUNTER — Encounter (INDEPENDENT_AMBULATORY_CARE_PROVIDER_SITE_OTHER): Payer: Self-pay | Admitting: Physician Assistant

## 2023-04-30 ENCOUNTER — Other Ambulatory Visit: Payer: Self-pay

## 2023-04-30 DIAGNOSIS — J189 Pneumonia, unspecified organism: Secondary | ICD-10-CM | POA: Diagnosis not present

## 2023-04-30 DIAGNOSIS — D869 Sarcoidosis, unspecified: Secondary | ICD-10-CM | POA: Diagnosis not present

## 2023-04-30 DIAGNOSIS — J45909 Unspecified asthma, uncomplicated: Secondary | ICD-10-CM | POA: Diagnosis not present

## 2023-04-30 DIAGNOSIS — J849 Interstitial pulmonary disease, unspecified: Secondary | ICD-10-CM | POA: Diagnosis not present

## 2023-04-30 DIAGNOSIS — J449 Chronic obstructive pulmonary disease, unspecified: Secondary | ICD-10-CM | POA: Diagnosis not present

## 2023-04-30 DIAGNOSIS — J439 Emphysema, unspecified: Secondary | ICD-10-CM | POA: Diagnosis not present

## 2023-04-30 NOTE — Patient Outreach (Signed)
 Medicaid Managed Care Social Work Note  04/30/2023 Name:  Teresa Mendez MRN:  [id number] DOB:  1975-09-22  Teresa Mendez is an 48 y.o. year old female who is a primary patient of Teresa Rigg, NP.  The Medicaid Managed Care Coordination team was consulted for assistance with:  Community Resources   Teresa Mendez was given information about Medicaid Managed Care Coordination team services today. Teresa Mendez Patient agreed to services and verbal consent obtained.  Engaged with patient  for by telephone forfollow up visit in response to referral for case management and/or care coordination services.   Patient is participating in a Managed Medicaid Plan:  Yes  Assessments/Interventions:  Review of past medical history, allergies, medications, health status, including review of consultants reports, laboratory and other test data, was performed as part of comprehensive evaluation and provision of chronic care management services.  SDOH: (Social Drivers of Health) assessments and interventions performed: SDOH Interventions    Flowsheet Row Office Visit from 02/18/2023 in [facility] [place] - [facility] Dept Of [place]. [facility] Patient Outreach Telephone from 11/14/2020 in [facility] Community Care Coordination Patient Outreach Telephone from 10/06/2020 in [facility] Community Care Coordination Patient Outreach Telephone from 09/07/2020 in [facility] Community Care Coordination Patient Outreach Telephone from 07/29/2020 in [facility] Care Coordination  SDOH Interventions       Food Insecurity Interventions AMB Referral Intervention Not Indicated -- -- --  Housing Interventions Intervention Not Indicated Intervention Not Indicated -- -- --  Transportation Interventions Intervention Not Indicated Intervention Not Indicated -- -- --  Utilities Interventions Intervention Not Indicated -- -- -- --  Alcohol Usage  Interventions Intervention Not Indicated (Score <7) -- -- -- --  Depression Interventions/Treatment  -- -- Referral to Psychiatry Referral to Psychiatry Patient refuses Treatment  Financial Strain Interventions Intervention Not Indicated -- -- -- --  Physical Activity Interventions Patient Declined Intervention Not Indicated, Other (Comments)  [not physically able] -- -- --  Stress Interventions Intervention Not Indicated -- Provide Counseling Provide Counseling --  Social Connections Interventions Intervention Not Indicated -- -- -- --  Health Literacy Interventions AMB Referral -- -- -- --     BSW completed a telephone outreach with patient, she states she did receive the resources BSW sent. Patient states things are going well. Patient reports no additional resources are needed at this time. Patient states the (PULSE OXIMETER FOR FINGER) is not working she has tried it 5 times. BSW informed she would send PCP a message. BSW provided patient with contact information for any future needs.  Advanced Directives Status:  Not addressed in this encounter.  Care Plan                 No Known Allergies  Medications Reviewed Today   Medications were not reviewed in this encounter     Patient Active Problem List   Diagnosis Date Noted   Primary osteoarthritis of both knees 09/05/2021   Former smoker 03/24/2020   Breast nodule 03/24/2020   GERD (gastroesophageal reflux disease) 09/07/2019   Ground glass opacity present on imaging of lung 09/07/2019   Interstitial pulmonary disease (HCC) 09/07/2019   Emphysema lung (HCC) 07/03/2019   Abnormal findings on diagnostic imaging of lung 07/03/2019   Myalgia 07/03/2019   Hypertensive urgency 05/18/2019   Chest pain 05/18/2019   Acute respiratory failure with hypoxia (HCC) 05/17/2019   PULMONARY SARCOIDOSIS 07/14/2007   Asthma-COPD overlap syndrome (  HCC) 07/14/2007   Essential hypertension 06/27/2007   DYSPNEA 06/27/2007    Conditions to be  addressed/monitored per PCP order:   community resources  There are no care plans that you recently modified to display for this patient.   Follow up:  Patient requests no follow-up at this time.  Plan: The  Patient has been provided with contact information for the Managed Medicaid care management team and has been advised to call with any health related questions or concerns.    Teresa Mendez, MHA [name]  Managed [facility] Social Worker [phone]

## 2023-04-30 NOTE — Patient Instructions (Signed)
 Visit Information  Ms. Shands was given information about Medicaid Managed Care team care coordination services as a part of their [facility] Medicaid benefit. Estill Batten [place] verbally consented to engagement with the [facility] Managed Care team.   If you are experiencing a medical emergency, please call 911 or report to your local emergency department or urgent care.   If you have a non-emergency medical problem during routine business hours, please contact your provider's office and ask to speak with a nurse.   For questions related to your [facility] health plan, please call: [phone] or visit the homepage here: [email or web address]  If you would like to schedule transportation through your [facility] plan, please call the following number at least 2 days in advance of your appointment: [phone]  For information about your ride after you set it up, call Ride Assist at [phone]. Use this number to activate a Will Call pickup, or if your transportation is late for a scheduled pickup. Use this number, too, if you need to make a change or cancel a previously scheduled reservation.  If you need transportation services right away, call [phone]. The after-hours call center is staffed 24 hours to handle ride assistance and urgent reservation requests (including discharges) 365 days a year. Urgent trips include sick visits, hospital discharge requests and life-sustaining treatment.  Call the [facility] Line at [phone], at any time, 24 hours a day, 7 days a week. If you are in danger or need immediate medical attention call 911.  If you would like help to quit smoking, call 1-800-QUIT-NOW (479-185-0885) OR Espaol: 1-855-Djelo-Ya (4-742-595-6387) o para ms informacin haga clic aqu or Text READY to 564-332 to register via text  Ms. Montour - following are the goals we discussed in your visit today:   Goals  Addressed   None     The  Patient                                              has been provided with contact information for the Managed Medicaid care management team and has been advised to call with any health related questions or concerns.   Gus Puma, [name], MHA [facility]  Managed Medicaid Social Worker [phone]   Following is a copy of your plan of care:  There are no care plans that you recently modified to display for this patient.

## 2023-05-01 DIAGNOSIS — J449 Chronic obstructive pulmonary disease, unspecified: Secondary | ICD-10-CM | POA: Diagnosis not present

## 2023-05-01 DIAGNOSIS — R32 Unspecified urinary incontinence: Secondary | ICD-10-CM | POA: Diagnosis not present

## 2023-05-01 DIAGNOSIS — I10 Essential (primary) hypertension: Secondary | ICD-10-CM | POA: Diagnosis not present

## 2023-05-02 DIAGNOSIS — J449 Chronic obstructive pulmonary disease, unspecified: Secondary | ICD-10-CM | POA: Diagnosis not present

## 2023-05-03 DIAGNOSIS — J449 Chronic obstructive pulmonary disease, unspecified: Secondary | ICD-10-CM | POA: Diagnosis not present

## 2023-05-04 DIAGNOSIS — J449 Chronic obstructive pulmonary disease, unspecified: Secondary | ICD-10-CM | POA: Diagnosis not present

## 2023-05-05 DIAGNOSIS — J449 Chronic obstructive pulmonary disease, unspecified: Secondary | ICD-10-CM | POA: Diagnosis not present

## 2023-05-06 ENCOUNTER — Encounter (INDEPENDENT_AMBULATORY_CARE_PROVIDER_SITE_OTHER): Payer: Self-pay

## 2023-05-06 ENCOUNTER — Encounter: Payer: Self-pay | Admitting: Nurse Practitioner

## 2023-05-06 DIAGNOSIS — J449 Chronic obstructive pulmonary disease, unspecified: Secondary | ICD-10-CM | POA: Diagnosis not present

## 2023-05-07 DIAGNOSIS — J449 Chronic obstructive pulmonary disease, unspecified: Secondary | ICD-10-CM | POA: Diagnosis not present

## 2023-05-08 DIAGNOSIS — J449 Chronic obstructive pulmonary disease, unspecified: Secondary | ICD-10-CM | POA: Diagnosis not present

## 2023-05-09 DIAGNOSIS — J449 Chronic obstructive pulmonary disease, unspecified: Secondary | ICD-10-CM | POA: Diagnosis not present

## 2023-05-10 DIAGNOSIS — J449 Chronic obstructive pulmonary disease, unspecified: Secondary | ICD-10-CM | POA: Diagnosis not present

## 2023-05-10 NOTE — Telephone Encounter (Signed)
 Noted.

## 2023-05-11 DIAGNOSIS — J449 Chronic obstructive pulmonary disease, unspecified: Secondary | ICD-10-CM | POA: Diagnosis not present

## 2023-05-12 DIAGNOSIS — J449 Chronic obstructive pulmonary disease, unspecified: Secondary | ICD-10-CM | POA: Diagnosis not present

## 2023-05-13 DIAGNOSIS — J449 Chronic obstructive pulmonary disease, unspecified: Secondary | ICD-10-CM | POA: Diagnosis not present

## 2023-05-14 DIAGNOSIS — J449 Chronic obstructive pulmonary disease, unspecified: Secondary | ICD-10-CM | POA: Diagnosis not present

## 2023-05-15 DIAGNOSIS — J449 Chronic obstructive pulmonary disease, unspecified: Secondary | ICD-10-CM | POA: Diagnosis not present

## 2023-05-16 DIAGNOSIS — J449 Chronic obstructive pulmonary disease, unspecified: Secondary | ICD-10-CM | POA: Diagnosis not present

## 2023-05-17 DIAGNOSIS — J449 Chronic obstructive pulmonary disease, unspecified: Secondary | ICD-10-CM | POA: Diagnosis not present

## 2023-05-18 DIAGNOSIS — J449 Chronic obstructive pulmonary disease, unspecified: Secondary | ICD-10-CM | POA: Diagnosis not present

## 2023-05-19 DIAGNOSIS — J449 Chronic obstructive pulmonary disease, unspecified: Secondary | ICD-10-CM | POA: Diagnosis not present

## 2023-05-20 DIAGNOSIS — J449 Chronic obstructive pulmonary disease, unspecified: Secondary | ICD-10-CM | POA: Diagnosis not present

## 2023-05-21 DIAGNOSIS — J449 Chronic obstructive pulmonary disease, unspecified: Secondary | ICD-10-CM | POA: Diagnosis not present

## 2023-05-22 DIAGNOSIS — J449 Chronic obstructive pulmonary disease, unspecified: Secondary | ICD-10-CM | POA: Diagnosis not present

## 2023-05-23 DIAGNOSIS — J449 Chronic obstructive pulmonary disease, unspecified: Secondary | ICD-10-CM | POA: Diagnosis not present

## 2023-05-24 DIAGNOSIS — J449 Chronic obstructive pulmonary disease, unspecified: Secondary | ICD-10-CM | POA: Diagnosis not present

## 2023-05-25 DIAGNOSIS — J449 Chronic obstructive pulmonary disease, unspecified: Secondary | ICD-10-CM | POA: Diagnosis not present

## 2023-05-26 DIAGNOSIS — J449 Chronic obstructive pulmonary disease, unspecified: Secondary | ICD-10-CM | POA: Diagnosis not present

## 2023-05-27 DIAGNOSIS — J449 Chronic obstructive pulmonary disease, unspecified: Secondary | ICD-10-CM | POA: Diagnosis not present

## 2023-05-28 DIAGNOSIS — J449 Chronic obstructive pulmonary disease, unspecified: Secondary | ICD-10-CM | POA: Diagnosis not present

## 2023-05-29 DIAGNOSIS — J449 Chronic obstructive pulmonary disease, unspecified: Secondary | ICD-10-CM | POA: Diagnosis not present

## 2023-05-30 DIAGNOSIS — J439 Emphysema, unspecified: Secondary | ICD-10-CM | POA: Diagnosis not present

## 2023-05-30 DIAGNOSIS — D869 Sarcoidosis, unspecified: Secondary | ICD-10-CM | POA: Diagnosis not present

## 2023-05-30 DIAGNOSIS — R32 Unspecified urinary incontinence: Secondary | ICD-10-CM | POA: Diagnosis not present

## 2023-05-30 DIAGNOSIS — J189 Pneumonia, unspecified organism: Secondary | ICD-10-CM | POA: Diagnosis not present

## 2023-05-30 DIAGNOSIS — J45909 Unspecified asthma, uncomplicated: Secondary | ICD-10-CM | POA: Diagnosis not present

## 2023-05-30 DIAGNOSIS — J849 Interstitial pulmonary disease, unspecified: Secondary | ICD-10-CM | POA: Diagnosis not present

## 2023-05-30 DIAGNOSIS — J449 Chronic obstructive pulmonary disease, unspecified: Secondary | ICD-10-CM | POA: Diagnosis not present

## 2023-05-30 DIAGNOSIS — I10 Essential (primary) hypertension: Secondary | ICD-10-CM | POA: Diagnosis not present

## 2023-05-31 DIAGNOSIS — J449 Chronic obstructive pulmonary disease, unspecified: Secondary | ICD-10-CM | POA: Diagnosis not present

## 2023-06-02 DIAGNOSIS — J449 Chronic obstructive pulmonary disease, unspecified: Secondary | ICD-10-CM | POA: Diagnosis not present

## 2023-06-03 DIAGNOSIS — J449 Chronic obstructive pulmonary disease, unspecified: Secondary | ICD-10-CM | POA: Diagnosis not present

## 2023-06-04 DIAGNOSIS — J449 Chronic obstructive pulmonary disease, unspecified: Secondary | ICD-10-CM | POA: Diagnosis not present

## 2023-06-05 DIAGNOSIS — J449 Chronic obstructive pulmonary disease, unspecified: Secondary | ICD-10-CM | POA: Diagnosis not present

## 2023-06-06 DIAGNOSIS — J449 Chronic obstructive pulmonary disease, unspecified: Secondary | ICD-10-CM | POA: Diagnosis not present

## 2023-06-07 ENCOUNTER — Other Ambulatory Visit: Payer: Self-pay | Admitting: Nurse Practitioner

## 2023-06-07 DIAGNOSIS — J449 Chronic obstructive pulmonary disease, unspecified: Secondary | ICD-10-CM | POA: Diagnosis not present

## 2023-06-07 DIAGNOSIS — J849 Interstitial pulmonary disease, unspecified: Secondary | ICD-10-CM

## 2023-06-08 ENCOUNTER — Other Ambulatory Visit (HOSPITAL_BASED_OUTPATIENT_CLINIC_OR_DEPARTMENT_OTHER): Payer: Self-pay

## 2023-06-08 ENCOUNTER — Other Ambulatory Visit (HOSPITAL_COMMUNITY): Payer: Self-pay

## 2023-06-08 DIAGNOSIS — J449 Chronic obstructive pulmonary disease, unspecified: Secondary | ICD-10-CM | POA: Diagnosis not present

## 2023-06-08 MED ORDER — PREDNISONE 20 MG PO TABS
40.0000 mg | ORAL_TABLET | Freq: Every day | ORAL | 0 refills | Status: AC
  Filled 2023-06-08: qty 10, 5d supply, fill #0

## 2023-06-09 DIAGNOSIS — J449 Chronic obstructive pulmonary disease, unspecified: Secondary | ICD-10-CM | POA: Diagnosis not present

## 2023-06-10 DIAGNOSIS — J449 Chronic obstructive pulmonary disease, unspecified: Secondary | ICD-10-CM | POA: Diagnosis not present

## 2023-06-11 ENCOUNTER — Other Ambulatory Visit (HOSPITAL_COMMUNITY): Payer: Self-pay

## 2023-06-11 DIAGNOSIS — J449 Chronic obstructive pulmonary disease, unspecified: Secondary | ICD-10-CM | POA: Diagnosis not present

## 2023-06-12 DIAGNOSIS — J449 Chronic obstructive pulmonary disease, unspecified: Secondary | ICD-10-CM | POA: Diagnosis not present

## 2023-06-13 DIAGNOSIS — J449 Chronic obstructive pulmonary disease, unspecified: Secondary | ICD-10-CM | POA: Diagnosis not present

## 2023-06-14 DIAGNOSIS — J449 Chronic obstructive pulmonary disease, unspecified: Secondary | ICD-10-CM | POA: Diagnosis not present

## 2023-06-15 DIAGNOSIS — J449 Chronic obstructive pulmonary disease, unspecified: Secondary | ICD-10-CM | POA: Diagnosis not present

## 2023-06-16 DIAGNOSIS — J449 Chronic obstructive pulmonary disease, unspecified: Secondary | ICD-10-CM | POA: Diagnosis not present

## 2023-06-17 DIAGNOSIS — J449 Chronic obstructive pulmonary disease, unspecified: Secondary | ICD-10-CM | POA: Diagnosis not present

## 2023-06-18 DIAGNOSIS — J449 Chronic obstructive pulmonary disease, unspecified: Secondary | ICD-10-CM | POA: Diagnosis not present

## 2023-06-19 DIAGNOSIS — J449 Chronic obstructive pulmonary disease, unspecified: Secondary | ICD-10-CM | POA: Diagnosis not present

## 2023-06-20 DIAGNOSIS — J449 Chronic obstructive pulmonary disease, unspecified: Secondary | ICD-10-CM | POA: Diagnosis not present

## 2023-06-21 DIAGNOSIS — J449 Chronic obstructive pulmonary disease, unspecified: Secondary | ICD-10-CM | POA: Diagnosis not present

## 2023-06-23 DIAGNOSIS — J449 Chronic obstructive pulmonary disease, unspecified: Secondary | ICD-10-CM | POA: Diagnosis not present

## 2023-06-24 DIAGNOSIS — J449 Chronic obstructive pulmonary disease, unspecified: Secondary | ICD-10-CM | POA: Diagnosis not present

## 2023-06-25 DIAGNOSIS — J449 Chronic obstructive pulmonary disease, unspecified: Secondary | ICD-10-CM | POA: Diagnosis not present

## 2023-06-26 DIAGNOSIS — J449 Chronic obstructive pulmonary disease, unspecified: Secondary | ICD-10-CM | POA: Diagnosis not present

## 2023-06-30 DIAGNOSIS — J849 Interstitial pulmonary disease, unspecified: Secondary | ICD-10-CM | POA: Diagnosis not present

## 2023-06-30 DIAGNOSIS — J189 Pneumonia, unspecified organism: Secondary | ICD-10-CM | POA: Diagnosis not present

## 2023-06-30 DIAGNOSIS — J439 Emphysema, unspecified: Secondary | ICD-10-CM | POA: Diagnosis not present

## 2023-06-30 DIAGNOSIS — D869 Sarcoidosis, unspecified: Secondary | ICD-10-CM | POA: Diagnosis not present

## 2023-06-30 DIAGNOSIS — I10 Essential (primary) hypertension: Secondary | ICD-10-CM | POA: Diagnosis not present

## 2023-06-30 DIAGNOSIS — R32 Unspecified urinary incontinence: Secondary | ICD-10-CM | POA: Diagnosis not present

## 2023-06-30 DIAGNOSIS — J45909 Unspecified asthma, uncomplicated: Secondary | ICD-10-CM | POA: Diagnosis not present

## 2023-06-30 DIAGNOSIS — J449 Chronic obstructive pulmonary disease, unspecified: Secondary | ICD-10-CM | POA: Diagnosis not present

## 2023-07-01 DIAGNOSIS — J449 Chronic obstructive pulmonary disease, unspecified: Secondary | ICD-10-CM | POA: Diagnosis not present

## 2023-07-02 DIAGNOSIS — J449 Chronic obstructive pulmonary disease, unspecified: Secondary | ICD-10-CM | POA: Diagnosis not present

## 2023-07-03 DIAGNOSIS — J449 Chronic obstructive pulmonary disease, unspecified: Secondary | ICD-10-CM | POA: Diagnosis not present

## 2023-07-04 DIAGNOSIS — J449 Chronic obstructive pulmonary disease, unspecified: Secondary | ICD-10-CM | POA: Diagnosis not present

## 2023-07-06 DIAGNOSIS — J449 Chronic obstructive pulmonary disease, unspecified: Secondary | ICD-10-CM | POA: Diagnosis not present

## 2023-07-07 DIAGNOSIS — J449 Chronic obstructive pulmonary disease, unspecified: Secondary | ICD-10-CM | POA: Diagnosis not present

## 2023-07-08 DIAGNOSIS — J449 Chronic obstructive pulmonary disease, unspecified: Secondary | ICD-10-CM | POA: Diagnosis not present

## 2023-07-09 DIAGNOSIS — J449 Chronic obstructive pulmonary disease, unspecified: Secondary | ICD-10-CM | POA: Diagnosis not present

## 2023-07-10 DIAGNOSIS — J449 Chronic obstructive pulmonary disease, unspecified: Secondary | ICD-10-CM | POA: Diagnosis not present

## 2023-07-12 DIAGNOSIS — J449 Chronic obstructive pulmonary disease, unspecified: Secondary | ICD-10-CM | POA: Diagnosis not present

## 2023-07-13 ENCOUNTER — Other Ambulatory Visit (HOSPITAL_COMMUNITY): Payer: Self-pay

## 2023-07-13 DIAGNOSIS — J449 Chronic obstructive pulmonary disease, unspecified: Secondary | ICD-10-CM | POA: Diagnosis not present

## 2023-07-14 DIAGNOSIS — J449 Chronic obstructive pulmonary disease, unspecified: Secondary | ICD-10-CM | POA: Diagnosis not present

## 2023-07-15 DIAGNOSIS — J449 Chronic obstructive pulmonary disease, unspecified: Secondary | ICD-10-CM | POA: Diagnosis not present

## 2023-07-16 DIAGNOSIS — J449 Chronic obstructive pulmonary disease, unspecified: Secondary | ICD-10-CM | POA: Diagnosis not present

## 2023-07-17 DIAGNOSIS — M170 Bilateral primary osteoarthritis of knee: Secondary | ICD-10-CM | POA: Diagnosis not present

## 2023-07-17 DIAGNOSIS — J449 Chronic obstructive pulmonary disease, unspecified: Secondary | ICD-10-CM | POA: Diagnosis not present

## 2023-07-18 DIAGNOSIS — J449 Chronic obstructive pulmonary disease, unspecified: Secondary | ICD-10-CM | POA: Diagnosis not present

## 2023-07-19 DIAGNOSIS — J449 Chronic obstructive pulmonary disease, unspecified: Secondary | ICD-10-CM | POA: Diagnosis not present

## 2023-07-20 DIAGNOSIS — J449 Chronic obstructive pulmonary disease, unspecified: Secondary | ICD-10-CM | POA: Diagnosis not present

## 2023-07-21 DIAGNOSIS — J449 Chronic obstructive pulmonary disease, unspecified: Secondary | ICD-10-CM | POA: Diagnosis not present

## 2023-07-22 DIAGNOSIS — J449 Chronic obstructive pulmonary disease, unspecified: Secondary | ICD-10-CM | POA: Diagnosis not present

## 2023-07-23 DIAGNOSIS — J449 Chronic obstructive pulmonary disease, unspecified: Secondary | ICD-10-CM | POA: Diagnosis not present

## 2023-07-24 DIAGNOSIS — J449 Chronic obstructive pulmonary disease, unspecified: Secondary | ICD-10-CM | POA: Diagnosis not present

## 2023-07-25 DIAGNOSIS — J449 Chronic obstructive pulmonary disease, unspecified: Secondary | ICD-10-CM | POA: Diagnosis not present

## 2023-07-26 DIAGNOSIS — J449 Chronic obstructive pulmonary disease, unspecified: Secondary | ICD-10-CM | POA: Diagnosis not present

## 2023-07-27 DIAGNOSIS — J449 Chronic obstructive pulmonary disease, unspecified: Secondary | ICD-10-CM | POA: Diagnosis not present

## 2023-07-28 DIAGNOSIS — J449 Chronic obstructive pulmonary disease, unspecified: Secondary | ICD-10-CM | POA: Diagnosis not present

## 2023-07-29 DIAGNOSIS — J449 Chronic obstructive pulmonary disease, unspecified: Secondary | ICD-10-CM | POA: Diagnosis not present

## 2023-07-30 DIAGNOSIS — J849 Interstitial pulmonary disease, unspecified: Secondary | ICD-10-CM | POA: Diagnosis not present

## 2023-07-30 DIAGNOSIS — J189 Pneumonia, unspecified organism: Secondary | ICD-10-CM | POA: Diagnosis not present

## 2023-07-30 DIAGNOSIS — J439 Emphysema, unspecified: Secondary | ICD-10-CM | POA: Diagnosis not present

## 2023-07-30 DIAGNOSIS — J45909 Unspecified asthma, uncomplicated: Secondary | ICD-10-CM | POA: Diagnosis not present

## 2023-07-30 DIAGNOSIS — D869 Sarcoidosis, unspecified: Secondary | ICD-10-CM | POA: Diagnosis not present

## 2023-07-30 DIAGNOSIS — R32 Unspecified urinary incontinence: Secondary | ICD-10-CM | POA: Diagnosis not present

## 2023-07-30 DIAGNOSIS — I10 Essential (primary) hypertension: Secondary | ICD-10-CM | POA: Diagnosis not present

## 2023-07-30 DIAGNOSIS — J449 Chronic obstructive pulmonary disease, unspecified: Secondary | ICD-10-CM | POA: Diagnosis not present

## 2023-07-31 DIAGNOSIS — J449 Chronic obstructive pulmonary disease, unspecified: Secondary | ICD-10-CM | POA: Diagnosis not present

## 2023-08-01 DIAGNOSIS — J449 Chronic obstructive pulmonary disease, unspecified: Secondary | ICD-10-CM | POA: Diagnosis not present

## 2023-08-02 DIAGNOSIS — J449 Chronic obstructive pulmonary disease, unspecified: Secondary | ICD-10-CM | POA: Diagnosis not present

## 2023-08-03 DIAGNOSIS — J449 Chronic obstructive pulmonary disease, unspecified: Secondary | ICD-10-CM | POA: Diagnosis not present

## 2023-08-04 DIAGNOSIS — J449 Chronic obstructive pulmonary disease, unspecified: Secondary | ICD-10-CM | POA: Diagnosis not present

## 2023-08-05 DIAGNOSIS — J449 Chronic obstructive pulmonary disease, unspecified: Secondary | ICD-10-CM | POA: Diagnosis not present

## 2023-08-06 DIAGNOSIS — J449 Chronic obstructive pulmonary disease, unspecified: Secondary | ICD-10-CM | POA: Diagnosis not present

## 2023-08-07 DIAGNOSIS — J449 Chronic obstructive pulmonary disease, unspecified: Secondary | ICD-10-CM | POA: Diagnosis not present

## 2023-08-08 DIAGNOSIS — J449 Chronic obstructive pulmonary disease, unspecified: Secondary | ICD-10-CM | POA: Diagnosis not present

## 2023-08-09 DIAGNOSIS — J449 Chronic obstructive pulmonary disease, unspecified: Secondary | ICD-10-CM | POA: Diagnosis not present

## 2023-08-10 DIAGNOSIS — J449 Chronic obstructive pulmonary disease, unspecified: Secondary | ICD-10-CM | POA: Diagnosis not present

## 2023-08-11 DIAGNOSIS — J449 Chronic obstructive pulmonary disease, unspecified: Secondary | ICD-10-CM | POA: Diagnosis not present

## 2023-08-12 DIAGNOSIS — J449 Chronic obstructive pulmonary disease, unspecified: Secondary | ICD-10-CM | POA: Diagnosis not present

## 2023-08-13 DIAGNOSIS — J449 Chronic obstructive pulmonary disease, unspecified: Secondary | ICD-10-CM | POA: Diagnosis not present

## 2023-08-14 DIAGNOSIS — J449 Chronic obstructive pulmonary disease, unspecified: Secondary | ICD-10-CM | POA: Diagnosis not present

## 2023-08-15 DIAGNOSIS — J449 Chronic obstructive pulmonary disease, unspecified: Secondary | ICD-10-CM | POA: Diagnosis not present

## 2023-08-16 ENCOUNTER — Encounter: Payer: Self-pay | Admitting: Nurse Practitioner

## 2023-08-16 ENCOUNTER — Encounter

## 2023-08-16 DIAGNOSIS — J449 Chronic obstructive pulmonary disease, unspecified: Secondary | ICD-10-CM | POA: Diagnosis not present

## 2023-08-17 DIAGNOSIS — J449 Chronic obstructive pulmonary disease, unspecified: Secondary | ICD-10-CM | POA: Diagnosis not present

## 2023-08-17 NOTE — Telephone Encounter (Signed)
 Needs visit for rash. She can see anyone for that. Whoever has an opening.

## 2023-08-18 DIAGNOSIS — J449 Chronic obstructive pulmonary disease, unspecified: Secondary | ICD-10-CM | POA: Diagnosis not present

## 2023-08-19 DIAGNOSIS — J449 Chronic obstructive pulmonary disease, unspecified: Secondary | ICD-10-CM | POA: Diagnosis not present

## 2023-08-20 DIAGNOSIS — J449 Chronic obstructive pulmonary disease, unspecified: Secondary | ICD-10-CM | POA: Diagnosis not present

## 2023-08-21 DIAGNOSIS — J449 Chronic obstructive pulmonary disease, unspecified: Secondary | ICD-10-CM | POA: Diagnosis not present

## 2023-08-22 DIAGNOSIS — J449 Chronic obstructive pulmonary disease, unspecified: Secondary | ICD-10-CM | POA: Diagnosis not present

## 2023-08-23 DIAGNOSIS — J449 Chronic obstructive pulmonary disease, unspecified: Secondary | ICD-10-CM | POA: Diagnosis not present

## 2023-08-24 DIAGNOSIS — J449 Chronic obstructive pulmonary disease, unspecified: Secondary | ICD-10-CM | POA: Diagnosis not present

## 2023-08-25 DIAGNOSIS — J449 Chronic obstructive pulmonary disease, unspecified: Secondary | ICD-10-CM | POA: Diagnosis not present

## 2023-08-26 DIAGNOSIS — J449 Chronic obstructive pulmonary disease, unspecified: Secondary | ICD-10-CM | POA: Diagnosis not present

## 2023-08-27 DIAGNOSIS — J449 Chronic obstructive pulmonary disease, unspecified: Secondary | ICD-10-CM | POA: Diagnosis not present

## 2023-08-28 DIAGNOSIS — J449 Chronic obstructive pulmonary disease, unspecified: Secondary | ICD-10-CM | POA: Diagnosis not present

## 2023-08-29 DIAGNOSIS — J449 Chronic obstructive pulmonary disease, unspecified: Secondary | ICD-10-CM | POA: Diagnosis not present

## 2023-08-30 DIAGNOSIS — R32 Unspecified urinary incontinence: Secondary | ICD-10-CM | POA: Diagnosis not present

## 2023-08-30 DIAGNOSIS — J189 Pneumonia, unspecified organism: Secondary | ICD-10-CM | POA: Diagnosis not present

## 2023-08-30 DIAGNOSIS — D869 Sarcoidosis, unspecified: Secondary | ICD-10-CM | POA: Diagnosis not present

## 2023-08-30 DIAGNOSIS — J439 Emphysema, unspecified: Secondary | ICD-10-CM | POA: Diagnosis not present

## 2023-08-30 DIAGNOSIS — J449 Chronic obstructive pulmonary disease, unspecified: Secondary | ICD-10-CM | POA: Diagnosis not present

## 2023-08-30 DIAGNOSIS — I10 Essential (primary) hypertension: Secondary | ICD-10-CM | POA: Diagnosis not present

## 2023-08-30 DIAGNOSIS — J45909 Unspecified asthma, uncomplicated: Secondary | ICD-10-CM | POA: Diagnosis not present

## 2023-08-30 DIAGNOSIS — J849 Interstitial pulmonary disease, unspecified: Secondary | ICD-10-CM | POA: Diagnosis not present

## 2023-08-31 DIAGNOSIS — J449 Chronic obstructive pulmonary disease, unspecified: Secondary | ICD-10-CM | POA: Diagnosis not present

## 2023-09-01 DIAGNOSIS — J449 Chronic obstructive pulmonary disease, unspecified: Secondary | ICD-10-CM | POA: Diagnosis not present

## 2023-09-02 DIAGNOSIS — J449 Chronic obstructive pulmonary disease, unspecified: Secondary | ICD-10-CM | POA: Diagnosis not present

## 2023-09-03 DIAGNOSIS — J449 Chronic obstructive pulmonary disease, unspecified: Secondary | ICD-10-CM | POA: Diagnosis not present

## 2023-09-04 DIAGNOSIS — J449 Chronic obstructive pulmonary disease, unspecified: Secondary | ICD-10-CM | POA: Diagnosis not present

## 2023-09-05 DIAGNOSIS — J449 Chronic obstructive pulmonary disease, unspecified: Secondary | ICD-10-CM | POA: Diagnosis not present

## 2023-09-06 DIAGNOSIS — J449 Chronic obstructive pulmonary disease, unspecified: Secondary | ICD-10-CM | POA: Diagnosis not present

## 2023-09-07 DIAGNOSIS — J449 Chronic obstructive pulmonary disease, unspecified: Secondary | ICD-10-CM | POA: Diagnosis not present

## 2023-09-08 DIAGNOSIS — J449 Chronic obstructive pulmonary disease, unspecified: Secondary | ICD-10-CM | POA: Diagnosis not present

## 2023-09-09 DIAGNOSIS — J449 Chronic obstructive pulmonary disease, unspecified: Secondary | ICD-10-CM | POA: Diagnosis not present

## 2023-09-10 DIAGNOSIS — J449 Chronic obstructive pulmonary disease, unspecified: Secondary | ICD-10-CM | POA: Diagnosis not present

## 2023-09-11 DIAGNOSIS — J449 Chronic obstructive pulmonary disease, unspecified: Secondary | ICD-10-CM | POA: Diagnosis not present

## 2023-09-12 DIAGNOSIS — J449 Chronic obstructive pulmonary disease, unspecified: Secondary | ICD-10-CM | POA: Diagnosis not present

## 2023-09-13 DIAGNOSIS — J449 Chronic obstructive pulmonary disease, unspecified: Secondary | ICD-10-CM | POA: Diagnosis not present

## 2023-09-14 DIAGNOSIS — J449 Chronic obstructive pulmonary disease, unspecified: Secondary | ICD-10-CM | POA: Diagnosis not present

## 2023-09-15 DIAGNOSIS — J449 Chronic obstructive pulmonary disease, unspecified: Secondary | ICD-10-CM | POA: Diagnosis not present

## 2023-09-16 DIAGNOSIS — J449 Chronic obstructive pulmonary disease, unspecified: Secondary | ICD-10-CM | POA: Diagnosis not present

## 2023-09-17 DIAGNOSIS — J449 Chronic obstructive pulmonary disease, unspecified: Secondary | ICD-10-CM | POA: Diagnosis not present

## 2023-09-18 DIAGNOSIS — J449 Chronic obstructive pulmonary disease, unspecified: Secondary | ICD-10-CM | POA: Diagnosis not present

## 2023-09-19 DIAGNOSIS — J449 Chronic obstructive pulmonary disease, unspecified: Secondary | ICD-10-CM | POA: Diagnosis not present

## 2023-09-20 DIAGNOSIS — J449 Chronic obstructive pulmonary disease, unspecified: Secondary | ICD-10-CM | POA: Diagnosis not present

## 2023-09-21 DIAGNOSIS — J449 Chronic obstructive pulmonary disease, unspecified: Secondary | ICD-10-CM | POA: Diagnosis not present

## 2023-09-22 DIAGNOSIS — J449 Chronic obstructive pulmonary disease, unspecified: Secondary | ICD-10-CM | POA: Diagnosis not present

## 2023-09-23 DIAGNOSIS — J449 Chronic obstructive pulmonary disease, unspecified: Secondary | ICD-10-CM | POA: Diagnosis not present

## 2023-09-24 ENCOUNTER — Other Ambulatory Visit: Payer: Self-pay | Admitting: Nurse Practitioner

## 2023-09-24 DIAGNOSIS — J849 Interstitial pulmonary disease, unspecified: Secondary | ICD-10-CM

## 2023-09-24 DIAGNOSIS — J449 Chronic obstructive pulmonary disease, unspecified: Secondary | ICD-10-CM | POA: Diagnosis not present

## 2023-09-25 DIAGNOSIS — J449 Chronic obstructive pulmonary disease, unspecified: Secondary | ICD-10-CM | POA: Diagnosis not present

## 2023-09-26 DIAGNOSIS — J449 Chronic obstructive pulmonary disease, unspecified: Secondary | ICD-10-CM | POA: Diagnosis not present

## 2023-09-28 DIAGNOSIS — J449 Chronic obstructive pulmonary disease, unspecified: Secondary | ICD-10-CM | POA: Diagnosis not present

## 2023-09-29 DIAGNOSIS — J449 Chronic obstructive pulmonary disease, unspecified: Secondary | ICD-10-CM | POA: Diagnosis not present

## 2023-09-30 DIAGNOSIS — J439 Emphysema, unspecified: Secondary | ICD-10-CM | POA: Diagnosis not present

## 2023-09-30 DIAGNOSIS — D869 Sarcoidosis, unspecified: Secondary | ICD-10-CM | POA: Diagnosis not present

## 2023-09-30 DIAGNOSIS — J45909 Unspecified asthma, uncomplicated: Secondary | ICD-10-CM | POA: Diagnosis not present

## 2023-09-30 DIAGNOSIS — J449 Chronic obstructive pulmonary disease, unspecified: Secondary | ICD-10-CM | POA: Diagnosis not present

## 2023-09-30 DIAGNOSIS — I10 Essential (primary) hypertension: Secondary | ICD-10-CM | POA: Diagnosis not present

## 2023-09-30 DIAGNOSIS — R32 Unspecified urinary incontinence: Secondary | ICD-10-CM | POA: Diagnosis not present

## 2023-10-01 ENCOUNTER — Telehealth: Payer: Self-pay | Admitting: Nurse Practitioner

## 2023-10-01 DIAGNOSIS — J449 Chronic obstructive pulmonary disease, unspecified: Secondary | ICD-10-CM | POA: Diagnosis not present

## 2023-10-01 NOTE — Telephone Encounter (Signed)
Pt confirmed appt 9/2

## 2023-10-02 ENCOUNTER — Ambulatory Visit: Admitting: Nurse Practitioner

## 2023-10-02 DIAGNOSIS — J449 Chronic obstructive pulmonary disease, unspecified: Secondary | ICD-10-CM | POA: Diagnosis not present

## 2023-10-03 DIAGNOSIS — J449 Chronic obstructive pulmonary disease, unspecified: Secondary | ICD-10-CM | POA: Diagnosis not present

## 2023-10-04 DIAGNOSIS — J449 Chronic obstructive pulmonary disease, unspecified: Secondary | ICD-10-CM | POA: Diagnosis not present

## 2023-10-05 DIAGNOSIS — J449 Chronic obstructive pulmonary disease, unspecified: Secondary | ICD-10-CM | POA: Diagnosis not present

## 2023-10-06 DIAGNOSIS — J449 Chronic obstructive pulmonary disease, unspecified: Secondary | ICD-10-CM | POA: Diagnosis not present

## 2023-10-07 DIAGNOSIS — J449 Chronic obstructive pulmonary disease, unspecified: Secondary | ICD-10-CM | POA: Diagnosis not present

## 2023-10-08 DIAGNOSIS — J449 Chronic obstructive pulmonary disease, unspecified: Secondary | ICD-10-CM | POA: Diagnosis not present

## 2023-10-09 DIAGNOSIS — J449 Chronic obstructive pulmonary disease, unspecified: Secondary | ICD-10-CM | POA: Diagnosis not present

## 2023-10-10 DIAGNOSIS — J449 Chronic obstructive pulmonary disease, unspecified: Secondary | ICD-10-CM | POA: Diagnosis not present

## 2023-10-11 DIAGNOSIS — J449 Chronic obstructive pulmonary disease, unspecified: Secondary | ICD-10-CM | POA: Diagnosis not present

## 2023-10-12 DIAGNOSIS — J449 Chronic obstructive pulmonary disease, unspecified: Secondary | ICD-10-CM | POA: Diagnosis not present

## 2023-10-13 DIAGNOSIS — J449 Chronic obstructive pulmonary disease, unspecified: Secondary | ICD-10-CM | POA: Diagnosis not present

## 2023-10-14 DIAGNOSIS — J189 Pneumonia, unspecified organism: Secondary | ICD-10-CM | POA: Diagnosis not present

## 2023-10-14 DIAGNOSIS — D869 Sarcoidosis, unspecified: Secondary | ICD-10-CM | POA: Diagnosis not present

## 2023-10-14 DIAGNOSIS — J45909 Unspecified asthma, uncomplicated: Secondary | ICD-10-CM | POA: Diagnosis not present

## 2023-10-14 DIAGNOSIS — J449 Chronic obstructive pulmonary disease, unspecified: Secondary | ICD-10-CM | POA: Diagnosis not present

## 2023-10-15 DIAGNOSIS — J449 Chronic obstructive pulmonary disease, unspecified: Secondary | ICD-10-CM | POA: Diagnosis not present

## 2023-10-16 DIAGNOSIS — J449 Chronic obstructive pulmonary disease, unspecified: Secondary | ICD-10-CM | POA: Diagnosis not present

## 2023-10-17 DIAGNOSIS — J449 Chronic obstructive pulmonary disease, unspecified: Secondary | ICD-10-CM | POA: Diagnosis not present

## 2023-10-18 DIAGNOSIS — J449 Chronic obstructive pulmonary disease, unspecified: Secondary | ICD-10-CM | POA: Diagnosis not present

## 2023-10-19 DIAGNOSIS — J449 Chronic obstructive pulmonary disease, unspecified: Secondary | ICD-10-CM | POA: Diagnosis not present

## 2023-10-20 DIAGNOSIS — J449 Chronic obstructive pulmonary disease, unspecified: Secondary | ICD-10-CM | POA: Diagnosis not present

## 2023-10-21 DIAGNOSIS — J449 Chronic obstructive pulmonary disease, unspecified: Secondary | ICD-10-CM | POA: Diagnosis not present

## 2023-10-22 DIAGNOSIS — J449 Chronic obstructive pulmonary disease, unspecified: Secondary | ICD-10-CM | POA: Diagnosis not present

## 2023-10-23 ENCOUNTER — Ambulatory Visit: Attending: Nurse Practitioner | Admitting: Nurse Practitioner

## 2023-10-23 ENCOUNTER — Encounter: Payer: Self-pay | Admitting: Nurse Practitioner

## 2023-10-23 VITALS — BP 136/91 | HR 80 | Resp 19 | Ht 64.0 in | Wt 269.4 lb

## 2023-10-23 DIAGNOSIS — J849 Interstitial pulmonary disease, unspecified: Secondary | ICD-10-CM | POA: Diagnosis not present

## 2023-10-23 DIAGNOSIS — E559 Vitamin D deficiency, unspecified: Secondary | ICD-10-CM | POA: Diagnosis not present

## 2023-10-23 DIAGNOSIS — M2550 Pain in unspecified joint: Secondary | ICD-10-CM | POA: Diagnosis not present

## 2023-10-23 DIAGNOSIS — I10 Essential (primary) hypertension: Secondary | ICD-10-CM

## 2023-10-23 DIAGNOSIS — Z23 Encounter for immunization: Secondary | ICD-10-CM | POA: Diagnosis not present

## 2023-10-23 DIAGNOSIS — Z1231 Encounter for screening mammogram for malignant neoplasm of breast: Secondary | ICD-10-CM | POA: Diagnosis not present

## 2023-10-23 DIAGNOSIS — N939 Abnormal uterine and vaginal bleeding, unspecified: Secondary | ICD-10-CM | POA: Diagnosis not present

## 2023-10-23 DIAGNOSIS — K219 Gastro-esophageal reflux disease without esophagitis: Secondary | ICD-10-CM | POA: Diagnosis not present

## 2023-10-23 DIAGNOSIS — R109 Unspecified abdominal pain: Secondary | ICD-10-CM

## 2023-10-23 DIAGNOSIS — J302 Other seasonal allergic rhinitis: Secondary | ICD-10-CM

## 2023-10-23 DIAGNOSIS — J449 Chronic obstructive pulmonary disease, unspecified: Secondary | ICD-10-CM | POA: Diagnosis not present

## 2023-10-23 DIAGNOSIS — E785 Hyperlipidemia, unspecified: Secondary | ICD-10-CM | POA: Diagnosis not present

## 2023-10-23 LAB — POCT URINE DIPSTICK
Bilirubin, UA: NEGATIVE
Glucose, UA: NEGATIVE mg/dL
Ketones, POC UA: NEGATIVE mg/dL
Nitrite, UA: NEGATIVE
POC PROTEIN,UA: 30 — AB
Spec Grav, UA: 1.015 (ref 1.010–1.025)
Urobilinogen, UA: 2 U/dL — AB
pH, UA: 7 (ref 5.0–8.0)

## 2023-10-23 MED ORDER — LISINOPRIL 40 MG PO TABS: 40.0000 mg | ORAL_TABLET | Freq: Every day | ORAL | 1 refills | Status: AC

## 2023-10-23 MED ORDER — FAMOTIDINE 20 MG PO TABS: 20.0000 mg | ORAL_TABLET | Freq: Every day | ORAL | 1 refills | Status: AC

## 2023-10-23 MED ORDER — DULOXETINE HCL 60 MG PO CPEP: 60.0000 mg | ORAL_CAPSULE | Freq: Every day | ORAL | 1 refills | Status: AC

## 2023-10-23 MED ORDER — HYDROCHLOROTHIAZIDE 25 MG PO TABS: 25.0000 mg | ORAL_TABLET | Freq: Every day | ORAL | 1 refills | Status: AC

## 2023-10-23 MED ORDER — OMEPRAZOLE 20 MG PO CPDR: 20.0000 mg | DELAYED_RELEASE_CAPSULE | Freq: Every day | ORAL | 1 refills | Status: AC

## 2023-10-23 MED ORDER — ALBUTEROL SULFATE (2.5 MG/3ML) 0.083% IN NEBU: 3.0000 mL | INHALATION_SOLUTION | Freq: Four times a day (QID) | RESPIRATORY_TRACT | 6 refills | Status: DC | PRN

## 2023-10-23 MED ORDER — DICLOFENAC SODIUM 1 % EX GEL: 2.0000 g | Freq: Four times a day (QID) | CUTANEOUS | 1 refills | Status: AC

## 2023-10-23 MED ORDER — ALBUTEROL SULFATE HFA 108 (90 BASE) MCG/ACT IN AERS: 1.0000 | INHALATION_SPRAY | Freq: Four times a day (QID) | RESPIRATORY_TRACT | 0 refills | Status: AC | PRN

## 2023-10-23 MED ORDER — ATORVASTATIN CALCIUM 80 MG PO TABS: 80.0000 mg | ORAL_TABLET | Freq: Every day | ORAL | 1 refills | Status: AC

## 2023-10-23 MED ORDER — FLUTICASONE PROPIONATE 50 MCG/ACT NA SUSP: 2.0000 | Freq: Every day | NASAL | 2 refills | Status: DC

## 2023-10-23 MED ORDER — TRIAMCINOLONE ACETONIDE 0.1 % EX OINT: 1.0000 | TOPICAL_OINTMENT | Freq: Two times a day (BID) | CUTANEOUS | 3 refills | Status: AC

## 2023-10-23 MED ORDER — MONTELUKAST SODIUM 10 MG PO TABS: 10.0000 mg | ORAL_TABLET | Freq: Every day | ORAL | 1 refills | Status: AC

## 2023-10-23 MED ORDER — AMLODIPINE BESYLATE 10 MG PO TABS: 10.0000 mg | ORAL_TABLET | Freq: Every day | ORAL | 1 refills | Status: AC

## 2023-10-23 MED ORDER — CARVEDILOL 6.25 MG PO TABS: 6.2500 mg | ORAL_TABLET | Freq: Two times a day (BID) | ORAL | 1 refills | Status: AC

## 2023-10-23 MED ORDER — CETIRIZINE HCL 10 MG PO TABS: 10.0000 mg | ORAL_TABLET | Freq: Every day | ORAL | 1 refills | Status: AC

## 2023-10-23 NOTE — Progress Notes (Signed)
 Assessment & Plan:  Teresa Mendez was seen today for flank pain.  Diagnoses and all orders for this visit:  Primary hypertension Instructed to take all medications as prescribed -     amLODipine  (NORVASC ) 10 MG tablet; Take 1 tablet (10 mg total) by mouth daily. -     carvedilol  (COREG ) 6.25 MG tablet; Take 1 tablet (6.25 mg total) by mouth 2 (two) times daily with a meal. -     hydrochlorothiazide  (HYDRODIURIL ) 25 MG tablet; Take 1 tablet (25 mg total) by mouth daily. -     lisinopril  (ZESTRIL ) 40 MG tablet; Take 1 tablet (40 mg total) by mouth daily. -     CMP14+EGFR  Bilateral flank pain -     POCT URINE DIPSTICK -     Urine Culture  Abnormal uterine bleeding (AUB) -     US  PELVIC COMPLETE WITH TRANSVAGINAL; Future -     CBC with Differential -     Ambulatory referral to Gynecology -     Iron, TIBC and Ferritin Panel  Dyslipidemia, goal LDL below 100 -     atorvastatin  (LIPITOR) 80 MG tablet; Take 1 tablet (80 mg total) by mouth daily. -     Lipid panel  Arthralgia of multiple joints -     diclofenac  Sodium (VOLTAREN ) 1 % GEL; Apply 2 g topically 4 (four) times daily. -     DULoxetine  (CYMBALTA ) 60 MG capsule; Take 1 capsule (60 mg total) by mouth daily.  Gastroesophageal reflux disease without esophagitis -     famotidine  (PEPCID ) 20 MG tablet; Take 1 tablet (20 mg total) by mouth at bedtime. -     omeprazole  (PRILOSEC) 20 MG capsule; Take 1 capsule (20 mg total) by mouth daily.  Seasonal allergies -     fluticasone  (FLONASE ) 50 MCG/ACT nasal spray; Place 2 sprays into both nostrils daily.  Need for influenza vaccination -     Flu vaccine trivalent PF, 6mos and older(Flulaval,Afluria,Fluarix,Fluzone)  Need for vaccination against Streptococcus pneumoniae -     Pneumococcal conjugate vaccine 20-valent (PCV20)  Breast cancer screening by mammogram -     MS 3D SCR MAMMO BILAT BR (aka MM); Future -     MM 3D DIAGNOSTIC MAMMOGRAM BILATERAL BREAST; Future  Interstitial  pulmonary disease (HCC) -     albuterol  (PROVENTIL ) (2.5 MG/3ML) 0.083% nebulizer solution; Take 3 mLs by nebulization every 6 (six) hours as needed for wheezing or shortness of breath. -     albuterol  (VENTOLIN  HFA) 108 (90 Base) MCG/ACT inhaler; Inhale 1-2 puffs into the lungs every 6 (six) hours as needed for wheezing or shortness of breath. -     cetirizine  (ZYRTEC ) 10 MG tablet; Take 1 tablet (10 mg total) by mouth daily. -     montelukast  (SINGULAIR ) 10 MG tablet; Take 1 tablet (10 mg total) by mouth at bedtime.  Vitamin D  deficiency disease -     VITAMIN D  25 Hydroxy (Vit-D Deficiency, Fractures)  Other orders -     triamcinolone  ointment (KENALOG ) 0.1 %; Apply 1 Application topically 2 (two) times daily.    Patient has been counseled on age-appropriate routine health concerns for screening and prevention. These are reviewed and up-to-date. Referrals have been placed accordingly. Immunizations are up-to-date or declined.    Subjective:   Chief Complaint  Patient presents with   Flank Pain    Discomfort of left and right side. Started three weeks ago.    Teresa Mendez 48 y.o. 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    She has a few hyperpigmented lesions scattered on both arms. States she thought she had shingles when they first appeared.    Blood pressure is elevated today. She states she has not taken her medications prior to her appointment.  BP Readings from Last 3 Encounters:  10/23/23 (!) 136/91  03/04/23 138/78  02/18/23 (!) 142/91    She has AUB which is occurring monthly with heavy bleeding. States she has a history of fibroids and has been experiencing extreme fatigue. She is also experiencing bilateral flank pain which started a few weeks ago.     She has chronic insomnia.   Review of Systems  Constitutional:  Negative for fever, malaise/fatigue and weight loss.  HENT: Negative.  Negative for nosebleeds.   Eyes: Negative.  Negative for blurred vision, double vision and photophobia.  Respiratory:  Positive for cough (chronic) and shortness of breath (chronic).   Cardiovascular: Negative.  Negative for chest pain, palpitations and leg swelling.  Gastrointestinal: Negative.  Negative for heartburn, nausea and vomiting.  Genitourinary:  Positive for flank pain. Negative for dysuria, frequency, hematuria and urgency.       AUB  Musculoskeletal:  Positive for joint pain (chronic). Negative for myalgias.  Neurological: Negative.  Negative for dizziness, focal weakness, seizures and headaches.  Endo/Heme/Allergies:  Positive for environmental allergies.  Psychiatric/Behavioral:  Negative for suicidal ideas. The patient has insomnia.     Past Medical History:  Diagnosis Date   Asthma    Hypertension    Sarcoidosis    Sickle cell trait    Stress incontinence     Past Surgical History:  Procedure Laterality Date   BREAST SURGERY Right    biopsy   TUBAL LIGATION      Family History  Problem Relation Age of Onset   Hypertension Mother    Congestive Heart Failure Mother    Kidney failure Mother    Sickle cell trait Mother    Hypertension Father    Congestive Heart Failure Father    Kidney failure Father    Glaucoma Father    Hypertension Brother    Liver disease Brother    Sickle cell trait Son    Sickle cell trait Daughter    Sickle cell trait Daughter     Social History Reviewed with no changes to be made today.   Outpatient Medications Prior to Visit  Medication Sig Dispense Refill   albuterol  (PROVENTIL ) (2.5 MG/3ML) 0.083% nebulizer solution TAKE 3 ML by nebulization EVERY 6 hours AS NEEDED FOR wheezing OR SHORTNESS OF BREATH 150 mL 0   aspirin  EC 81 MG tablet Take 81 mg by mouth daily.     Blood Pressure  Monitor DEVI Please provide patient with insurance approved blood pressure monitor I10.0 1 each 0   Budeson-Glycopyrrol-Formoterol  (BREZTRI  AEROSPHERE) 160-9-4.8 MCG/ACT AERO Inhale 2 puffs into the lungs in the morning and at bedtime. 10.7 g 11   Masks MISC Nebulizer face mask J45.909 1 each 0   Misc. Devices (PULSE OXIMETER FOR FINGER) MISC Use as instructed J44.89 1 each 0   olopatadine  ([employer] OLOPATADINE  HCL) 0.1 % ophthalmic solution Place 1 drop into both eyes 2 (two) times daily. 5 mL 12   OXYGEN  Inhale into the lungs. 3 liters  Semaglutide -Weight Management (WEGOVY ) 0.25 MG/0.5ML SOAJ Inject 0.5 mg into the skin once a week. 2 mL 1   albuterol  (PROVENTIL ) (2.5 MG/3ML) 0.083% nebulizer solution TAKE 3 MLS (2.5 MG TOTAL) BY NEBULIZATION EVERY 6 (SIX) HOURS AS NEEDED FOR WHEEZING OR SHORTNESS OF BREATH. 150 mL 0   albuterol  (VENTOLIN  HFA) 108 (90 Base) MCG/ACT inhaler INHALE 1 TO 2 puffs into THE lungs EVERY 4 HOURS AS NEEDED FOR wheezing OR SHORTNESS OF BREATH 18 g 0   amLODipine  (NORVASC ) 10 MG tablet Take 1 tablet (10 mg total) by mouth daily. 90 tablet 1   atorvastatin  (LIPITOR) 80 MG tablet Take 1 tablet (80 mg total) by mouth daily. 90 tablet 1   carvedilol  (COREG ) 6.25 MG tablet Take 1 tablet (6.25 mg total) by mouth 2 (two) times daily with a meal. 180 tablet 1   cetirizine  (ZYRTEC ) 10 MG tablet Take 1 tablet (10 mg total) by mouth daily. 90 tablet 1   diclofenac  Sodium (VOLTAREN ) 1 % GEL Apply 2 g topically 4 (four) times daily. 200 g 1   DULoxetine  (CYMBALTA ) 60 MG capsule Take 1 capsule (60 mg total) by mouth daily. 90 capsule 1   famotidine  (PEPCID ) 20 MG tablet Take 1 tablet (20 mg total) by mouth at bedtime. 90 tablet 1   fluticasone  (FLONASE ) 50 MCG/ACT nasal spray Place 2 sprays into both nostrils daily. 16 g 2   hydrochlorothiazide  (HYDRODIURIL ) 25 MG tablet Take 1 tablet (25 mg total) by mouth daily. 90 tablet 1   lisinopril  (ZESTRIL ) 40 MG tablet Take 1 tablet (40 mg total)  by mouth daily. 90 tablet 1   montelukast  (SINGULAIR ) 10 MG tablet Take 1 tablet (10 mg total) by mouth at bedtime. 90 tablet 1   omeprazole  (PRILOSEC) 20 MG capsule Take 1 capsule (20 mg total) by mouth daily. 90 capsule 1   triamcinolone  ointment (KENALOG ) 0.1 % Apply 1 Application topically 2 (two) times daily. 60 g 3   potassium chloride  SA (KLOR-CON  M) 20 MEQ tablet Take 1 tablet (20 mEq total) by mouth daily. (Patient not taking: Reported on 10/23/2023) 90 tablet 1   acetaminophen -codeine  (TYLENOL  #3) 300-30 MG tablet Take 1-2 tablets by mouth every 4 (four) hours as needed for moderate pain. (Patient not taking: Reported on 10/23/2023) 60 tablet 0   No facility-administered medications prior to visit.    No Known Allergies     Objective:    BP (!) 136/91 (BP Location: Left Arm, Patient Position: Sitting, Cuff Size: Large)   Pulse 80   Resp 19   Ht 5' 4 (1.626 m)   Wt 269 lb 6.4 oz (122.2 kg)   LMP 10/21/2023 (Approximate)   SpO2 98%   BMI 46.24 kg/m  Wt Readings from Last 3 Encounters:  10/23/23 269 lb 6.4 oz (122.2 kg)  03/04/23 269 lb 12.8 oz (122.4 kg)  02/18/23 272 lb 12.8 oz (123.7 kg)    Physical Exam Vitals and nursing note reviewed.  Constitutional:      Appearance: She is well-developed.  HENT:     Head: Normocephalic and atraumatic.  Cardiovascular:     Rate and Rhythm: Normal rate and regular rhythm.     Heart sounds: Normal heart sounds. No murmur heard.    No friction rub. No gallop.  Pulmonary:     Effort: Pulmonary effort is normal. No tachypnea or respiratory distress.     Breath sounds: Normal breath sounds. No decreased breath sounds, wheezing, rhonchi or rales.  Chest:  Chest wall: No tenderness.  Musculoskeletal:        General: Normal range of motion.     Cervical back: Normal range of motion.  Skin:    General: Skin is warm and dry.  Neurological:     Mental Status: She is alert and oriented to person, place, and time.      Coordination: Coordination normal.  Psychiatric:        Behavior: Behavior normal. Behavior is cooperative.        Thought Content: Thought content normal.        Judgment: Judgment normal.          Patient has been counseled extensively about nutrition and exercise as well as the importance of adherence with medications and regular follow-up. The patient was given clear instructions to go to ER or return to medical center if symptoms don't improve, worsen or new problems develop. The patient verbalized understanding.   Follow-up: Return in about 3 months (around 01/24/2024).   Haze LELON Servant, FNP-BC [facility] [place], [place] [phone]   10/23/2023, 4:35 PM

## 2023-10-23 NOTE — Patient Instructions (Addendum)
 [facility] Located in: [facility] Address: [address], [place], [place] [address] Phone: [phone]    MELATONIN  10 mg FOR SLEEP B complex  B12 for energy

## 2023-10-24 ENCOUNTER — Other Ambulatory Visit: Payer: Self-pay | Admitting: Nurse Practitioner

## 2023-10-24 DIAGNOSIS — D241 Benign neoplasm of right breast: Secondary | ICD-10-CM

## 2023-10-24 DIAGNOSIS — N939 Abnormal uterine and vaginal bleeding, unspecified: Secondary | ICD-10-CM

## 2023-10-24 DIAGNOSIS — J849 Interstitial pulmonary disease, unspecified: Secondary | ICD-10-CM

## 2023-10-24 DIAGNOSIS — Z1231 Encounter for screening mammogram for malignant neoplasm of breast: Secondary | ICD-10-CM

## 2023-10-24 DIAGNOSIS — E785 Hyperlipidemia, unspecified: Secondary | ICD-10-CM

## 2023-10-24 DIAGNOSIS — I10 Essential (primary) hypertension: Secondary | ICD-10-CM

## 2023-10-24 DIAGNOSIS — J302 Other seasonal allergic rhinitis: Secondary | ICD-10-CM

## 2023-10-24 DIAGNOSIS — Z23 Encounter for immunization: Secondary | ICD-10-CM

## 2023-10-24 DIAGNOSIS — J449 Chronic obstructive pulmonary disease, unspecified: Secondary | ICD-10-CM | POA: Diagnosis not present

## 2023-10-24 DIAGNOSIS — M2550 Pain in unspecified joint: Secondary | ICD-10-CM

## 2023-10-24 DIAGNOSIS — K219 Gastro-esophageal reflux disease without esophagitis: Secondary | ICD-10-CM

## 2023-10-24 DIAGNOSIS — R109 Unspecified abdominal pain: Secondary | ICD-10-CM

## 2023-10-24 DIAGNOSIS — E559 Vitamin D deficiency, unspecified: Secondary | ICD-10-CM

## 2023-10-24 LAB — CBC WITH DIFFERENTIAL/PLATELET
Basophils Absolute: 0 x10E3/uL (ref 0.0–0.2)
Basos: 0 %
EOS (ABSOLUTE): 0.1 x10E3/uL (ref 0.0–0.4)
Eos: 1 %
Hematocrit: 38.4 % (ref 34.0–46.6)
Hemoglobin: 11.6 g/dL (ref 11.1–15.9)
Immature Grans (Abs): 0 x10E3/uL (ref 0.0–0.1)
Immature Granulocytes: 0 %
Lymphocytes Absolute: 2.2 x10E3/uL (ref 0.7–3.1)
Lymphs: 21 %
MCH: 24.4 pg — ABNORMAL LOW (ref 26.6–33.0)
MCHC: 30.2 g/dL — ABNORMAL LOW (ref 31.5–35.7)
MCV: 81 fL (ref 79–97)
Monocytes Absolute: 0.8 x10E3/uL (ref 0.1–0.9)
Monocytes: 8 %
Neutrophils Absolute: 7.3 x10E3/uL — ABNORMAL HIGH (ref 1.4–7.0)
Neutrophils: 70 %
Platelets: 355 x10E3/uL (ref 150–450)
RBC: 4.76 x10E6/uL (ref 3.77–5.28)
RDW: 13.7 % (ref 11.7–15.4)
WBC: 10.4 x10E3/uL (ref 3.4–10.8)

## 2023-10-24 LAB — CMP14+EGFR
ALT: 18 IU/L (ref 0–32)
AST: 14 IU/L (ref 0–40)
Albumin: 4.3 g/dL (ref 3.9–4.9)
Alkaline Phosphatase: 111 IU/L (ref 41–116)
BUN/Creatinine Ratio: 15 (ref 9–23)
BUN: 13 mg/dL (ref 6–24)
Bilirubin Total: 0.5 mg/dL (ref 0.0–1.2)
CO2: 34 mmol/L — ABNORMAL HIGH (ref 20–29)
Calcium: 9.7 mg/dL (ref 8.7–10.2)
Chloride: 94 mmol/L — ABNORMAL LOW (ref 96–106)
Creatinine, Ser: 0.84 mg/dL (ref 0.57–1.00)
Globulin, Total: 2.9 g/dL (ref 1.5–4.5)
Glucose: 100 mg/dL — ABNORMAL HIGH (ref 70–99)
Potassium: 3.4 mmol/L — ABNORMAL LOW (ref 3.5–5.2)
Sodium: 143 mmol/L (ref 134–144)
Total Protein: 7.2 g/dL (ref 6.0–8.5)
eGFR: 85 mL/min/1.73 (ref >59)

## 2023-10-24 LAB — IRON,TIBC AND FERRITIN PANEL
Ferritin: 31 ng/mL (ref 15–150)
Iron Saturation: 11 % — ABNORMAL LOW (ref 15–55)
Iron: 45 ug/dL (ref 27–159)
Total Iron Binding Capacity: 404 ug/dL (ref 250–450)
UIBC: 359 ug/dL (ref 131–425)

## 2023-10-24 LAB — LIPID PANEL
Chol/HDL Ratio: 3.1 ratio (ref 0.0–4.4)
Cholesterol, Total: 165 mg/dL (ref 100–199)
HDL: 53 mg/dL (ref >39)
LDL Chol Calc (NIH): 100 mg/dL — ABNORMAL HIGH (ref 0–99)
Triglycerides: 61 mg/dL (ref 0–149)
VLDL Cholesterol Cal: 12 mg/dL (ref 5–40)

## 2023-10-24 LAB — VITAMIN D 25 HYDROXY (VIT D DEFICIENCY, FRACTURES): Vit D, 25-Hydroxy: 5.4 ng/mL — ABNORMAL LOW (ref 30.0–100.0)

## 2023-10-25 DIAGNOSIS — J449 Chronic obstructive pulmonary disease, unspecified: Secondary | ICD-10-CM | POA: Diagnosis not present

## 2023-10-25 DIAGNOSIS — M170 Bilateral primary osteoarthritis of knee: Secondary | ICD-10-CM | POA: Diagnosis not present

## 2023-10-25 LAB — URINE CULTURE

## 2023-10-26 DIAGNOSIS — J449 Chronic obstructive pulmonary disease, unspecified: Secondary | ICD-10-CM | POA: Diagnosis not present

## 2023-10-27 DIAGNOSIS — J449 Chronic obstructive pulmonary disease, unspecified: Secondary | ICD-10-CM | POA: Diagnosis not present

## 2023-10-28 ENCOUNTER — Ambulatory Visit
Admission: RE | Admit: 2023-10-28 | Discharge: 2023-10-28 | Disposition: A | Source: Ambulatory Visit | Attending: Nurse Practitioner

## 2023-10-28 DIAGNOSIS — N939 Abnormal uterine and vaginal bleeding, unspecified: Secondary | ICD-10-CM

## 2023-10-28 DIAGNOSIS — D251 Intramural leiomyoma of uterus: Secondary | ICD-10-CM | POA: Diagnosis not present

## 2023-10-28 DIAGNOSIS — N888 Other specified noninflammatory disorders of cervix uteri: Secondary | ICD-10-CM | POA: Diagnosis not present

## 2023-10-29 ENCOUNTER — Ambulatory Visit: Payer: Self-pay | Admitting: Nurse Practitioner

## 2023-10-29 DIAGNOSIS — J449 Chronic obstructive pulmonary disease, unspecified: Secondary | ICD-10-CM | POA: Diagnosis not present

## 2023-10-30 ENCOUNTER — Other Ambulatory Visit: Payer: Self-pay | Admitting: Nurse Practitioner

## 2023-10-30 DIAGNOSIS — D869 Sarcoidosis, unspecified: Secondary | ICD-10-CM | POA: Diagnosis not present

## 2023-10-30 DIAGNOSIS — J45909 Unspecified asthma, uncomplicated: Secondary | ICD-10-CM | POA: Diagnosis not present

## 2023-10-30 DIAGNOSIS — I10 Essential (primary) hypertension: Secondary | ICD-10-CM | POA: Diagnosis not present

## 2023-10-30 DIAGNOSIS — J449 Chronic obstructive pulmonary disease, unspecified: Secondary | ICD-10-CM | POA: Diagnosis not present

## 2023-10-30 DIAGNOSIS — J189 Pneumonia, unspecified organism: Secondary | ICD-10-CM | POA: Diagnosis not present

## 2023-10-30 DIAGNOSIS — R32 Unspecified urinary incontinence: Secondary | ICD-10-CM | POA: Diagnosis not present

## 2023-10-30 DIAGNOSIS — J439 Emphysema, unspecified: Secondary | ICD-10-CM | POA: Diagnosis not present

## 2023-10-30 DIAGNOSIS — J849 Interstitial pulmonary disease, unspecified: Secondary | ICD-10-CM | POA: Diagnosis not present

## 2023-10-30 MED ORDER — VITAMIN D (ERGOCALCIFEROL) 1.25 MG (50000 UNIT) PO CAPS: 50000.0000 [IU] | ORAL_CAPSULE | ORAL | 1 refills | Status: AC

## 2023-10-31 DIAGNOSIS — J449 Chronic obstructive pulmonary disease, unspecified: Secondary | ICD-10-CM | POA: Diagnosis not present

## 2023-11-01 ENCOUNTER — Other Ambulatory Visit: Payer: Self-pay | Admitting: Nurse Practitioner

## 2023-11-01 DIAGNOSIS — E559 Vitamin D deficiency, unspecified: Secondary | ICD-10-CM

## 2023-11-01 DIAGNOSIS — I10 Essential (primary) hypertension: Secondary | ICD-10-CM

## 2023-11-01 DIAGNOSIS — N939 Abnormal uterine and vaginal bleeding, unspecified: Secondary | ICD-10-CM

## 2023-11-01 DIAGNOSIS — K219 Gastro-esophageal reflux disease without esophagitis: Secondary | ICD-10-CM

## 2023-11-01 DIAGNOSIS — M2550 Pain in unspecified joint: Secondary | ICD-10-CM

## 2023-11-01 DIAGNOSIS — Z23 Encounter for immunization: Secondary | ICD-10-CM

## 2023-11-01 DIAGNOSIS — E785 Hyperlipidemia, unspecified: Secondary | ICD-10-CM

## 2023-11-01 DIAGNOSIS — D241 Benign neoplasm of right breast: Secondary | ICD-10-CM

## 2023-11-01 DIAGNOSIS — Z1231 Encounter for screening mammogram for malignant neoplasm of breast: Secondary | ICD-10-CM

## 2023-11-01 DIAGNOSIS — J449 Chronic obstructive pulmonary disease, unspecified: Secondary | ICD-10-CM | POA: Diagnosis not present

## 2023-11-01 DIAGNOSIS — J849 Interstitial pulmonary disease, unspecified: Secondary | ICD-10-CM

## 2023-11-01 DIAGNOSIS — R10A3 Flank pain, bilateral: Secondary | ICD-10-CM

## 2023-11-01 DIAGNOSIS — J302 Other seasonal allergic rhinitis: Secondary | ICD-10-CM

## 2023-11-02 DIAGNOSIS — J449 Chronic obstructive pulmonary disease, unspecified: Secondary | ICD-10-CM | POA: Diagnosis not present

## 2023-11-03 DIAGNOSIS — J449 Chronic obstructive pulmonary disease, unspecified: Secondary | ICD-10-CM | POA: Diagnosis not present

## 2023-11-04 DIAGNOSIS — J449 Chronic obstructive pulmonary disease, unspecified: Secondary | ICD-10-CM | POA: Diagnosis not present

## 2023-11-05 DIAGNOSIS — J449 Chronic obstructive pulmonary disease, unspecified: Secondary | ICD-10-CM | POA: Diagnosis not present

## 2023-11-06 ENCOUNTER — Other Ambulatory Visit

## 2023-11-06 ENCOUNTER — Encounter

## 2023-11-06 DIAGNOSIS — J449 Chronic obstructive pulmonary disease, unspecified: Secondary | ICD-10-CM | POA: Diagnosis not present

## 2023-11-07 DIAGNOSIS — J449 Chronic obstructive pulmonary disease, unspecified: Secondary | ICD-10-CM | POA: Diagnosis not present

## 2023-11-08 DIAGNOSIS — J449 Chronic obstructive pulmonary disease, unspecified: Secondary | ICD-10-CM | POA: Diagnosis not present

## 2023-11-09 DIAGNOSIS — J449 Chronic obstructive pulmonary disease, unspecified: Secondary | ICD-10-CM | POA: Diagnosis not present

## 2023-11-10 DIAGNOSIS — J449 Chronic obstructive pulmonary disease, unspecified: Secondary | ICD-10-CM | POA: Diagnosis not present

## 2023-11-12 DIAGNOSIS — J449 Chronic obstructive pulmonary disease, unspecified: Secondary | ICD-10-CM | POA: Diagnosis not present

## 2023-11-13 DIAGNOSIS — J449 Chronic obstructive pulmonary disease, unspecified: Secondary | ICD-10-CM | POA: Diagnosis not present

## 2023-11-14 DIAGNOSIS — J449 Chronic obstructive pulmonary disease, unspecified: Secondary | ICD-10-CM | POA: Diagnosis not present

## 2023-11-15 DIAGNOSIS — J449 Chronic obstructive pulmonary disease, unspecified: Secondary | ICD-10-CM | POA: Diagnosis not present

## 2023-11-18 DIAGNOSIS — J449 Chronic obstructive pulmonary disease, unspecified: Secondary | ICD-10-CM | POA: Diagnosis not present

## 2023-11-19 ENCOUNTER — Ambulatory Visit

## 2023-11-19 ENCOUNTER — Ambulatory Visit
Admission: RE | Admit: 2023-11-19 | Discharge: 2023-11-19 | Disposition: A | Source: Ambulatory Visit | Attending: Nurse Practitioner

## 2023-11-19 DIAGNOSIS — R928 Other abnormal and inconclusive findings on diagnostic imaging of breast: Secondary | ICD-10-CM | POA: Diagnosis not present

## 2023-11-19 DIAGNOSIS — D241 Benign neoplasm of right breast: Secondary | ICD-10-CM

## 2023-11-19 DIAGNOSIS — J449 Chronic obstructive pulmonary disease, unspecified: Secondary | ICD-10-CM | POA: Diagnosis not present

## 2023-11-20 ENCOUNTER — Ambulatory Visit: Payer: Self-pay | Admitting: Nurse Practitioner

## 2023-11-20 DIAGNOSIS — J449 Chronic obstructive pulmonary disease, unspecified: Secondary | ICD-10-CM | POA: Diagnosis not present

## 2023-11-21 DIAGNOSIS — J449 Chronic obstructive pulmonary disease, unspecified: Secondary | ICD-10-CM | POA: Diagnosis not present

## 2023-11-22 DIAGNOSIS — J449 Chronic obstructive pulmonary disease, unspecified: Secondary | ICD-10-CM | POA: Diagnosis not present

## 2023-11-23 DIAGNOSIS — J449 Chronic obstructive pulmonary disease, unspecified: Secondary | ICD-10-CM | POA: Diagnosis not present

## 2023-11-24 DIAGNOSIS — J449 Chronic obstructive pulmonary disease, unspecified: Secondary | ICD-10-CM | POA: Diagnosis not present

## 2023-11-25 DIAGNOSIS — J449 Chronic obstructive pulmonary disease, unspecified: Secondary | ICD-10-CM | POA: Diagnosis not present

## 2023-11-26 DIAGNOSIS — J449 Chronic obstructive pulmonary disease, unspecified: Secondary | ICD-10-CM | POA: Diagnosis not present

## 2023-11-27 DIAGNOSIS — J449 Chronic obstructive pulmonary disease, unspecified: Secondary | ICD-10-CM | POA: Diagnosis not present

## 2023-11-28 DIAGNOSIS — J449 Chronic obstructive pulmonary disease, unspecified: Secondary | ICD-10-CM | POA: Diagnosis not present

## 2023-11-29 DIAGNOSIS — J449 Chronic obstructive pulmonary disease, unspecified: Secondary | ICD-10-CM | POA: Diagnosis not present

## 2023-11-30 DIAGNOSIS — R32 Unspecified urinary incontinence: Secondary | ICD-10-CM | POA: Diagnosis not present

## 2023-11-30 DIAGNOSIS — J45909 Unspecified asthma, uncomplicated: Secondary | ICD-10-CM | POA: Diagnosis not present

## 2023-11-30 DIAGNOSIS — J449 Chronic obstructive pulmonary disease, unspecified: Secondary | ICD-10-CM | POA: Diagnosis not present

## 2023-11-30 DIAGNOSIS — J849 Interstitial pulmonary disease, unspecified: Secondary | ICD-10-CM | POA: Diagnosis not present

## 2023-11-30 DIAGNOSIS — D869 Sarcoidosis, unspecified: Secondary | ICD-10-CM | POA: Diagnosis not present

## 2023-11-30 DIAGNOSIS — I10 Essential (primary) hypertension: Secondary | ICD-10-CM | POA: Diagnosis not present

## 2023-11-30 DIAGNOSIS — J439 Emphysema, unspecified: Secondary | ICD-10-CM | POA: Diagnosis not present

## 2023-12-02 DIAGNOSIS — J449 Chronic obstructive pulmonary disease, unspecified: Secondary | ICD-10-CM | POA: Diagnosis not present

## 2023-12-03 DIAGNOSIS — J449 Chronic obstructive pulmonary disease, unspecified: Secondary | ICD-10-CM | POA: Diagnosis not present

## 2023-12-04 DIAGNOSIS — J449 Chronic obstructive pulmonary disease, unspecified: Secondary | ICD-10-CM | POA: Diagnosis not present

## 2023-12-05 DIAGNOSIS — M47816 Spondylosis without myelopathy or radiculopathy, lumbar region: Secondary | ICD-10-CM | POA: Diagnosis not present

## 2023-12-05 DIAGNOSIS — M5459 Other low back pain: Secondary | ICD-10-CM | POA: Diagnosis not present

## 2023-12-05 DIAGNOSIS — J449 Chronic obstructive pulmonary disease, unspecified: Secondary | ICD-10-CM | POA: Diagnosis not present

## 2023-12-06 DIAGNOSIS — J449 Chronic obstructive pulmonary disease, unspecified: Secondary | ICD-10-CM | POA: Diagnosis not present

## 2023-12-08 DIAGNOSIS — J449 Chronic obstructive pulmonary disease, unspecified: Secondary | ICD-10-CM | POA: Diagnosis not present

## 2023-12-09 DIAGNOSIS — J449 Chronic obstructive pulmonary disease, unspecified: Secondary | ICD-10-CM | POA: Diagnosis not present

## 2023-12-11 DIAGNOSIS — J449 Chronic obstructive pulmonary disease, unspecified: Secondary | ICD-10-CM | POA: Diagnosis not present

## 2023-12-12 DIAGNOSIS — J449 Chronic obstructive pulmonary disease, unspecified: Secondary | ICD-10-CM | POA: Diagnosis not present

## 2023-12-13 DIAGNOSIS — J449 Chronic obstructive pulmonary disease, unspecified: Secondary | ICD-10-CM | POA: Diagnosis not present

## 2023-12-16 DIAGNOSIS — J449 Chronic obstructive pulmonary disease, unspecified: Secondary | ICD-10-CM | POA: Diagnosis not present

## 2023-12-17 DIAGNOSIS — J449 Chronic obstructive pulmonary disease, unspecified: Secondary | ICD-10-CM | POA: Diagnosis not present

## 2023-12-18 DIAGNOSIS — J449 Chronic obstructive pulmonary disease, unspecified: Secondary | ICD-10-CM | POA: Diagnosis not present

## 2023-12-19 DIAGNOSIS — J449 Chronic obstructive pulmonary disease, unspecified: Secondary | ICD-10-CM | POA: Diagnosis not present

## 2023-12-20 DIAGNOSIS — J449 Chronic obstructive pulmonary disease, unspecified: Secondary | ICD-10-CM | POA: Diagnosis not present

## 2023-12-21 DIAGNOSIS — J449 Chronic obstructive pulmonary disease, unspecified: Secondary | ICD-10-CM | POA: Diagnosis not present

## 2023-12-22 DIAGNOSIS — J449 Chronic obstructive pulmonary disease, unspecified: Secondary | ICD-10-CM | POA: Diagnosis not present

## 2023-12-23 DIAGNOSIS — J449 Chronic obstructive pulmonary disease, unspecified: Secondary | ICD-10-CM | POA: Diagnosis not present

## 2023-12-24 DIAGNOSIS — J449 Chronic obstructive pulmonary disease, unspecified: Secondary | ICD-10-CM | POA: Diagnosis not present

## 2023-12-25 DIAGNOSIS — J449 Chronic obstructive pulmonary disease, unspecified: Secondary | ICD-10-CM | POA: Diagnosis not present

## 2023-12-27 DIAGNOSIS — J449 Chronic obstructive pulmonary disease, unspecified: Secondary | ICD-10-CM | POA: Diagnosis not present

## 2023-12-28 DIAGNOSIS — J449 Chronic obstructive pulmonary disease, unspecified: Secondary | ICD-10-CM | POA: Diagnosis not present

## 2023-12-29 DIAGNOSIS — J449 Chronic obstructive pulmonary disease, unspecified: Secondary | ICD-10-CM | POA: Diagnosis not present

## 2023-12-30 ENCOUNTER — Encounter: Payer: Self-pay | Admitting: Nurse Practitioner

## 2023-12-30 ENCOUNTER — Telehealth: Payer: Self-pay | Admitting: *Deleted

## 2023-12-30 ENCOUNTER — Ambulatory Visit: Payer: Self-pay | Admitting: Pulmonary Disease

## 2023-12-30 ENCOUNTER — Other Ambulatory Visit: Payer: Self-pay

## 2023-12-30 ENCOUNTER — Emergency Department (HOSPITAL_COMMUNITY)

## 2023-12-30 ENCOUNTER — Emergency Department (HOSPITAL_COMMUNITY)
Admission: EM | Admit: 2023-12-30 | Discharge: 2023-12-30 | Disposition: A | Discharge status: Home / Self Care | Attending: Emergency Medicine | Admitting: Emergency Medicine

## 2023-12-30 ENCOUNTER — Encounter: Payer: Self-pay | Admitting: Pulmonary Disease

## 2023-12-30 ENCOUNTER — Encounter (HOSPITAL_COMMUNITY): Payer: Self-pay | Admitting: Radiology

## 2023-12-30 DIAGNOSIS — E876 Hypokalemia: Secondary | ICD-10-CM | POA: Insufficient documentation

## 2023-12-30 DIAGNOSIS — Z7951 Long term (current) use of inhaled steroids: Secondary | ICD-10-CM | POA: Insufficient documentation

## 2023-12-30 DIAGNOSIS — Z7982 Long term (current) use of aspirin: Secondary | ICD-10-CM | POA: Diagnosis not present

## 2023-12-30 DIAGNOSIS — Z79899 Other long term (current) drug therapy: Secondary | ICD-10-CM | POA: Diagnosis not present

## 2023-12-30 DIAGNOSIS — R0602 Shortness of breath: Secondary | ICD-10-CM | POA: Diagnosis not present

## 2023-12-30 DIAGNOSIS — J209 Acute bronchitis, unspecified: Secondary | ICD-10-CM | POA: Insufficient documentation

## 2023-12-30 DIAGNOSIS — J449 Chronic obstructive pulmonary disease, unspecified: Secondary | ICD-10-CM | POA: Diagnosis not present

## 2023-12-30 DIAGNOSIS — J441 Chronic obstructive pulmonary disease with (acute) exacerbation: Secondary | ICD-10-CM | POA: Diagnosis not present

## 2023-12-30 DIAGNOSIS — I10 Essential (primary) hypertension: Secondary | ICD-10-CM | POA: Insufficient documentation

## 2023-12-30 LAB — CBC
HCT: 35.2 % — ABNORMAL LOW (ref 36.0–46.0)
Hemoglobin: 10.4 g/dL — ABNORMAL LOW (ref 12.0–15.0)
MCH: 23.6 pg — ABNORMAL LOW (ref 26.0–34.0)
MCHC: 29.5 g/dL — ABNORMAL LOW (ref 30.0–36.0)
MCV: 79.8 fL — ABNORMAL LOW (ref 80.0–100.0)
Platelets: 303 K/uL (ref 150–400)
RBC: 4.41 MIL/uL (ref 3.87–5.11)
RDW: 14.2 % (ref 11.5–15.5)
WBC: 11.5 K/uL — ABNORMAL HIGH (ref 4.0–10.5)
nRBC: 0 % (ref 0.0–0.2)

## 2023-12-30 LAB — BASIC METABOLIC PANEL WITH GFR
Anion gap: 8 (ref 5–15)
BUN: 11 mg/dL (ref 6–20)
CO2: 42 mmol/L — ABNORMAL HIGH (ref 22–32)
Calcium: 8.9 mg/dL (ref 8.9–10.3)
Chloride: 90 mmol/L — ABNORMAL LOW (ref 98–111)
Creatinine, Ser: 0.79 mg/dL (ref 0.44–1.00)
GFR, Estimated: >60 mL/min (ref >60)
Glucose, Bld: 106 mg/dL — ABNORMAL HIGH (ref 70–99)
Potassium: 3.1 mmol/L — ABNORMAL LOW (ref 3.5–5.1)
Sodium: 140 mmol/L (ref 135–145)

## 2023-12-30 LAB — TROPONIN I (HIGH SENSITIVITY)
Troponin I (High Sensitivity): 15 ng/L (ref <18)
Troponin I (High Sensitivity): 16 ng/L (ref <18)

## 2023-12-30 LAB — RESP PANEL BY RT-PCR (RSV, FLU A&B, COVID)  RVPGX2
Influenza A by PCR: NEGATIVE
Influenza B by PCR: NEGATIVE
Resp Syncytial Virus by PCR: NEGATIVE
SARS Coronavirus 2 by RT PCR: NEGATIVE

## 2023-12-30 LAB — HCG, SERUM, QUALITATIVE: Preg, Serum: NEGATIVE

## 2023-12-30 MED ORDER — DOXYCYCLINE HYCLATE 100 MG PO TABS
100.0000 mg | ORAL_TABLET | Freq: Once | ORAL | Status: AC
  Administered 2023-12-30: 100 mg via ORAL
  Filled 2023-12-30: qty 1

## 2023-12-30 MED ORDER — IPRATROPIUM-ALBUTEROL 0.5-2.5 (3) MG/3ML IN SOLN
3.0000 mL | Freq: Once | RESPIRATORY_TRACT | Status: AC
  Administered 2023-12-30: 3 mL via RESPIRATORY_TRACT
  Filled 2023-12-30: qty 3

## 2023-12-30 MED ORDER — DOXYCYCLINE HYCLATE 100 MG PO CAPS: 100.0000 mg | ORAL_CAPSULE | Freq: Two times a day (BID) | ORAL | 0 refills | Status: DC

## 2023-12-30 MED ORDER — METHYLPREDNISOLONE SODIUM SUCC 125 MG IJ SOLR
125.0000 mg | Freq: Once | INTRAMUSCULAR | Status: AC
  Administered 2023-12-30: 125 mg via INTRAVENOUS
  Filled 2023-12-30: qty 2

## 2023-12-30 MED ORDER — ALUM & MAG HYDROXIDE-SIMETH 200-200-20 MG/5ML PO SUSP
30.0000 mL | Freq: Once | ORAL | Status: AC
  Administered 2023-12-30: 30 mL via ORAL
  Filled 2023-12-30: qty 30

## 2023-12-30 MED ORDER — PREDNISONE 50 MG PO TABS: 50.0000 mg | ORAL_TABLET | Freq: Every day | ORAL | 0 refills | Status: DC

## 2023-12-30 MED ORDER — POTASSIUM CHLORIDE CRYS ER 20 MEQ PO TBCR
40.0000 meq | EXTENDED_RELEASE_TABLET | Freq: Once | ORAL | Status: AC
  Administered 2023-12-30: 40 meq via ORAL
  Filled 2023-12-30: qty 2

## 2023-12-30 NOTE — Telephone Encounter (Signed)
 I do not see where she went to the ED and she did not answer when I called.

## 2023-12-30 NOTE — Telephone Encounter (Signed)
 FYI Only or Action Required?: FYI only for provider: ED advised.  Patient is followed in Pulmonology for pulmonary disease, last seen on 03/04/2023 by Hunsucker, Donnice SAUNDERS, MD.  Called Nurse Triage reporting Shortness of Breath. weakness  Symptoms began a week ago. SOB getting worse, weakness getting worse. Pt wants to go to ED  Interventions attempted: Maintenance inhaler. Nebulizer is not working  Symptoms are: rapidly worsening.  Triage Disposition: Go to ED Now (Notify PCP)  Patient/caregiver understands and will follow disposition?:                   E2C2 Pulmonary Triage - Initial Assessment Questions "Chief Complaint (e.g., cough, sob, wheezing, fever, chills, sweat or additional symptoms) *Go to specific symptom protocol after initial questions. Weak in the chest burning sensation - constant - Weakness  "How long have symptoms been present?" 1 week - SOB bad  Have you tested for COVID or Flu? Note: If not, ask patient if a home test can be taken. If so, instruct patient to call back for positive results. No  MEDICINES:   "Have you used any OTC meds to help with symptoms?" No If yes, ask "What medications?" na  "Have you used your inhalers/maintenance medication?" Yes If yes, "What medications?" Inhaler - nebulizer not working  If inhaler, ask "How many puffs and how often?" Note: Review instructions on medication in the chart. More than usual  OXYGEN : "Do you wear supplemental oxygen ?" Yes If yes, "How many liters are you supposed to use?" 2 L now on 3 L  "Do you monitor your oxygen  levels?" Yes If yes, What is your reading (oxygen  level) today? 100  What is your usual oxygen  saturation reading?  (Note: Pulmonary O2 sats should be 90% or greater) 100                  Copied from CRM #[id number]. Topic: Clinical - Red Word Triage >> Dec 30, 2023  3:18 PM Isabell A wrote: Red Word that prompted transfer to Nurse Triage: SOB,  chest tightness Reason for Disposition  [1] MODERATE difficulty breathing (e.g., speaks in phrases, SOB even at rest, pulse 100-120) AND [2] NEW-onset or WORSE than normal  Answer Assessment - Initial Assessment Questions 1. RESPIRATORY STATUS: Describe your breathing? (e.g., wheezing, shortness of breath, unable to speak, severe coughing)      SOB, weakness 2. ONSET: When did this breathing problem begin?      1 week - worsening 3. PATTERN Does the difficult breathing come and go, or has it been constant since it started?      constant 4. SEVERITY: How bad is your breathing? (e.g., mild, moderate, severe)      Severe 5. RECURRENT SYMPTOM: Have you had difficulty breathing before? If Yes, ask: When was the last time? and What happened that time?      yes 6. CARDIAC HISTORY: Do you have any history of heart disease? (e.g., heart attack, angina, bypass surgery, angioplasty)      Did not ask 7. LUNG HISTORY: Do you have any history of lung disease?  (e.g., pulmonary embolus, asthma, emphysema)     yes 8. CAUSE: What do you think is causing the breathing problem?      Did not ask 9. OTHER SYMPTOMS: Do you have any other symptoms? (e.g., chest pain, cough, dizziness, fever, runny nose)     weakness 10. O2 SATURATION MONITOR:  Do you use an oxygen  saturation monitor (pulse oximeter) at home? If Yes,  ask: What is your reading (oxygen  level) today? What is your usual oxygen  saturation reading? (e.g., 95%)       100 - but on 3 L  Protocols used: Breathing Difficulty-A-AH

## 2023-12-30 NOTE — ED Triage Notes (Signed)
 Pt sent by pulmonologist for sob; increased oxygen  to 4L from 3L; productive cough with clear sputum

## 2023-12-30 NOTE — ED Provider Triage Note (Signed)
 Emergency Medicine Provider Triage Evaluation Note  TILA MILLIRONS , a 48 y.o. female  was evaluated in triage.  Pt complains of sob. Progressive worsening sob and burning sensation in chest for the past several days.  Having to increase her home O2 from 3L to 4L.  No fever, no n/v/d or abd pain  Review of Systems  Positive: As above Negative: As above  Physical Exam  BP (!) 173/106   Pulse 88   Temp 98.4 F (36.9 C)   Resp 20   Ht 5' 4 (1.626 m)   Wt 122 kg   SpO2 100%   BMI 46.17 kg/m  Gen:   Awake, no distress   Resp:  Normal effort  MSK:   Moves extremities without difficulty  Other:    Medical Decision Making  Medically screening exam initiated at 7:44 PM.  Appropriate orders placed.  Julissa Browning Evans was informed that the remainder of the evaluation will be completed by another provider, this initial triage assessment does not replace that evaluation, and the importance of remaining in the ED until their evaluation is complete.     Nivia Colon, PA-C 12/30/23 1945

## 2023-12-30 NOTE — Telephone Encounter (Signed)
 Converted from [facility]pl group:  Having short of breathe and nebulizer has stop working .I had got it from [facility] and.no longer are with them. May need some prednisone  due to change of weather and a new prescription for nebulizer machine [facility] [address] .cold weather is very rough for me. And coughing at night badly   ------------------------------------------------------------------------------------------------------------------------------------------------  ATC x1.  LVM to return call.  Has not been seen since 03/04/23, she is overdue for a visit (August 2025).  She needs an in office visit.

## 2023-12-30 NOTE — ED Notes (Signed)
 Pt ambulated on 3L O2/[place]. Pt stating at 95% with ambulation.

## 2023-12-30 NOTE — ED Provider Notes (Signed)
 [facility] Provider Note   CSN: [id number] Arrival date & time: 12/30/23  8170     Patient presents with: Shortness of Breath and Chest Pain   Teresa Mendez is a 48 y.o. female.   Pt is a 48 yo female with pmhx significant for COPD (on 3L baseline), HTN, HLD, GERD, and sarcoidosis.  Pt said she's been getting progressively sob for the past week.  She has had to increase her oxygen  to 4L from her normal 3.  She has had a cough at night and is having trouble breathing at night.  She has a burning sensation in her chest.  Grandson recently in the ICU for a viral infection.  She did message her pulmonologist who told her to go to the ED.       Prior to Admission medications   Medication Sig Start Date End Date Taking? Authorizing Provider  doxycycline (VIBRAMYCIN) 100 MG capsule Take 1 capsule (100 mg total) by mouth 2 (two) times daily. 12/30/23  Yes Dean Clarity, MD  predniSONE  (DELTASONE ) 50 MG tablet Take 1 tablet (50 mg total) by mouth daily with breakfast. 12/30/23  Yes Dean Clarity, MD  albuterol  (PROVENTIL ) (2.5 MG/3ML) 0.083% nebulizer solution TAKE 3 ML by nebulization EVERY 6 hours AS NEEDED FOR wheezing OR SHORTNESS OF BREATH 09/25/23   Delbert Clam, MD  albuterol  (PROVENTIL ) (2.5 MG/3ML) 0.083% nebulizer solution Take 3 mLs by nebulization every 6 (six) hours as needed for wheezing or shortness of breath. 10/23/23   Fleming, Zelda W, NP  albuterol  (VENTOLIN  HFA) 108 (90 Base) MCG/ACT inhaler Inhale 1-2 puffs into the lungs every 6 (six) hours as needed for wheezing or shortness of breath. 10/23/23   Fleming, Zelda W, NP  amLODipine  (NORVASC ) 10 MG tablet Take 1 tablet (10 mg total) by mouth daily. 10/23/23   Theotis Haze ORN, NP  aspirin  EC 81 MG tablet Take 81 mg by mouth daily.    [provider]  atorvastatin  (LIPITOR) 80 MG tablet Take 1 tablet (80 mg total) by mouth daily. 10/23/23   Theotis Haze ORN, NP  Blood  Pressure Monitor DEVI Please provide patient with insurance approved blood pressure monitor I10.0 12/21/22   Theotis Haze ORN, NP  Budeson-Glycopyrrol-Formoterol  (BREZTRI  AEROSPHERE) 160-9-4.8 MCG/ACT AERO Inhale 2 puffs into the lungs in the morning and at bedtime. 02/19/23   Fleming, Zelda W, NP  carvedilol  (COREG ) 6.25 MG tablet Take 1 tablet (6.25 mg total) by mouth 2 (two) times daily with a meal. 10/23/23   Theotis Haze ORN, NP  cetirizine  (ZYRTEC ) 10 MG tablet Take 1 tablet (10 mg total) by mouth daily. 10/23/23   Fleming, Zelda W, NP  diclofenac  Sodium (VOLTAREN ) 1 % GEL Apply 2 g topically 4 (four) times daily. 10/23/23   Fleming, Zelda W, NP  DULoxetine  (CYMBALTA ) 60 MG capsule Take 1 capsule (60 mg total) by mouth daily. 10/23/23   Fleming, Zelda W, NP  famotidine  (PEPCID ) 20 MG tablet Take 1 tablet (20 mg total) by mouth at bedtime. 10/23/23   Fleming, Zelda W, NP  fluticasone  (FLONASE ) 50 MCG/ACT nasal spray Place 2 sprays into both nostrils daily. 10/23/23   Fleming, Zelda W, NP  hydrochlorothiazide  (HYDRODIURIL ) 25 MG tablet Take 1 tablet (25 mg total) by mouth daily. 10/23/23   Fleming, Zelda W, NP  lisinopril  (ZESTRIL ) 40 MG tablet Take 1 tablet (40 mg total) by mouth daily. 10/23/23   Fleming, Zelda W, NP  Masks MISC Nebulizer  face mask J45.909 08/29/22   Theotis Haze ORN, NP  Misc. Devices (PULSE OXIMETER FOR FINGER) MISC Use as instructed J44.89 02/18/23   Fleming, Zelda W, NP  montelukast  (SINGULAIR ) 10 MG tablet Take 1 tablet (10 mg total) by mouth at bedtime. 10/23/23   Theotis Haze ORN, NP  olopatadine  ([employer] OLOPATADINE  HCL) 0.1 % ophthalmic solution Place 1 drop into both eyes 2 (two) times daily. 07/06/21   Rising, Asberry, PA-C  omeprazole  (PRILOSEC) 20 MG capsule Take 1 capsule (20 mg total) by mouth daily. 10/23/23   Theotis Haze ORN, NP  OXYGEN  Inhale into the lungs. 3 liters    [provider]  potassium chloride  SA (KLOR-CON  M) 20 MEQ tablet Take 1 tablet (20 mEq total) by  mouth daily. Patient not taking: Reported on 10/23/2023 08/29/22   Fleming, Zelda W, NP  Semaglutide -Weight Management (WEGOVY ) 0.25 MG/0.5ML SOAJ Inject 0.5 mg into the skin once a week. 02/18/23   Fleming, Zelda W, NP  triamcinolone  ointment (KENALOG ) 0.1 % Apply 1 Application topically 2 (two) times daily. 10/23/23   Theotis Haze ORN, NP  Vitamin D , Ergocalciferol , (DRISDOL ) 1.25 MG (50000 UNIT) CAPS capsule Take 1 capsule (50,000 Units total) by mouth every 7 (seven) days. 10/30/23   Fleming, Zelda W, NP    Allergies: Patient has no known allergies.    Review of Systems  Respiratory:  Positive for cough, chest tightness and shortness of breath.   All other systems reviewed and are negative.   Updated Vital Signs BP (!) 173/106   Pulse 88   Temp 98.4 F (36.9 C)   Resp 20   Ht 5' 4 (1.626 m)   Wt 122 kg   SpO2 99%   BMI 46.17 kg/m   Physical Exam Vitals and nursing note reviewed.  Constitutional:      Appearance: She is well-developed.  HENT:     Head: Normocephalic and atraumatic.     Mouth/Throat:     Mouth: Mucous membranes are moist.     Pharynx: Oropharynx is clear.  Eyes:     Extraocular Movements: Extraocular movements intact.     Pupils: Pupils are equal, round, and reactive to light.  Cardiovascular:     Rate and Rhythm: Normal rate and regular rhythm.  Pulmonary:     Effort: Pulmonary effort is normal.     Breath sounds: Wheezing present.  Abdominal:     General: Bowel sounds are normal.     Palpations: Abdomen is soft.  Musculoskeletal:        General: Normal range of motion.     Cervical back: Normal range of motion and neck supple.  Skin:    General: Skin is warm.     Capillary Refill: Capillary refill takes less than 2 seconds.  Neurological:     General: No focal deficit present.     Mental Status: She is alert and oriented to person, place, and time.  Psychiatric:        Mood and Affect: Mood normal.        Behavior: Behavior normal.      (all labs ordered are listed, but only abnormal results are displayed) Labs Reviewed  BASIC METABOLIC PANEL WITH GFR - Abnormal; Notable for the following components:      Result Value   Potassium 3.1 (*)    Chloride 90 (*)    CO2 42 (*)    Glucose, Bld 106 (*)    All other components within normal limits  CBC -  Abnormal; Notable for the following components:   WBC 11.5 (*)    Hemoglobin 10.4 (*)    HCT 35.2 (*)    MCV 79.8 (*)    MCH 23.6 (*)    MCHC 29.5 (*)    All other components within normal limits  RESP PANEL BY RT-PCR (RSV, FLU A&B, COVID)  [id number]  HCG, SERUM, QUALITATIVE  TROPONIN I (HIGH SENSITIVITY)  TROPONIN I (HIGH SENSITIVITY)    EKG: EKG Interpretation Date/Time:  Monday December 30 2023 19:17:45 EST Ventricular Rate:  83 PR Interval:  126 QRS Duration:  86 QT Interval:  354 QTC Calculation: 415 R Axis:   70  Text Interpretation: Normal sinus rhythm Minimal voltage criteria for LVH, may be normal variant ( Sokolow-Lyon ) Nonspecific T wave abnormality Abnormal ECG When compared with ECG of 27-Nov-2019 01:13, PREVIOUS ECG IS PRESENT No significant change since last tracing Confirmed by Dean Clarity [phone]) on 12/30/2023 8:26:43 PM  Radiology: [facility] Chest 2 View Result Date: 12/30/2023 CLINICAL DATA:  Shortness of breath EXAM: CHEST - 2 VIEW COMPARISON:  Chest x-ray 07/06/2021 FINDINGS: The heart size and mediastinal contours are within normal limits. Both lungs are clear. The visualized skeletal structures are unremarkable. IMPRESSION: No active cardiopulmonary disease. Electronically Signed   By: Greig Pique M.D.   On: 12/30/2023 20:54     Procedures   Medications Ordered in the ED  alum & mag hydroxide-simeth (MAALOX/MYLANTA) 200-200-20 MG/5ML suspension 30 mL (30 mLs Oral Given 12/30/23 2056)  methylPREDNISolone  sodium succinate (SOLU-MEDROL ) 125 mg/2 mL injection 125 mg (125 mg Intravenous Given 12/30/23 2054)  ipratropium-albuterol  (DUONEB)  0.5-2.5 (3) MG/3ML nebulizer solution 3 mL (3 mLs Nebulization Given 12/30/23 2056)  doxycycline (VIBRA-TABS) tablet 100 mg (100 mg Oral Given 12/30/23 2225)  potassium chloride  SA (KLOR-CON  M) CR tablet 40 mEq (40 mEq Oral Given 12/30/23 2225)                                    Medical Decision Making Amount and/or Complexity of Data Reviewed Labs: ordered. Radiology: ordered.  Risk OTC drugs. Prescription drug management.   This patient presents to the ED for concern of sob, this involves an extensive number of treatment options, and is a complaint that carries with it a high risk of complications and morbidity.  The differential diagnosis includes copd exac, pna, covid/flu/rsv, bronchitis   Co morbidities that complicate the patient evaluation  COPD (on 3L baseline), HTN, HLD, GERD, and sarcoidosis   Additional history obtained:  Additional history obtained from epic chart review  Lab Tests:  I Ordered, and personally interpreted labs.  The pertinent results include:  cbc with hgb 10.4 (11.6 in Sept); bmp with k low at 3.1; covid/flu/rsv neg; trop neg; preg neg   Imaging Studies ordered:  I ordered imaging studies including cxr  I independently visualized and interpreted imaging which showed No active cardiopulmonary disease.  I agree with the radiologist interpretation   Cardiac Monitoring:  The patient was maintained on a cardiac monitor.  I personally viewed and interpreted the cardiac monitored which showed an underlying rhythm of: nsr   Medicines ordered and prescription drug management:  I ordered medication including duoneb/solumedrol/doxy  for sx  Reevaluation of the patient after these medicines showed that the patient improved I have reviewed the patients home medicines and have made adjustments as needed   Test Considered:  ct   Critical Interventions:  nebs   Problem List / ED Course:  Acute bronchitis/COPD exac:  pt is much improved.  She  is able to ambulate with O2 sat staying over 95% with ambulation.  Her neb machine is broken, so I have written her a rx for a new one.  She does have a rescue inhaler.  She is started on doxy as sx have been going on for several days.  She is stable for d/c.  Return if worse.  F/u with pcp/pulm.   Reevaluation:  After the interventions noted above, I reevaluated the patient and found that they have :improved   Social Determinants of Health:  Lives at home   Dispostion:  After consideration of the diagnostic results and the patients response to treatment, I feel that the patent would benefit from discharge with outpatient f/u.       Final diagnoses:  Acute bronchitis, unspecified organism  COPD exacerbation (HCC)  Hypokalemia    ED Discharge Orders          Ordered    doxycycline (VIBRAMYCIN) 100 MG capsule  2 times daily        12/30/23 2221    predniSONE  (DELTASONE ) 50 MG tablet  Daily with breakfast        12/30/23 2221    For home use only DME Nebulizer machine        12/30/23 2221               Dean Clarity, MD 12/30/23 2231

## 2023-12-30 NOTE — Telephone Encounter (Signed)
 Can we confirm that she went to the ED?  Can we get her a follow-up visit with myself or APP next available?

## 2023-12-30 NOTE — Telephone Encounter (Signed)
 Will get her next available when we speak with her.

## 2023-12-30 NOTE — ED Triage Notes (Signed)
 Pt endorses that she has had shortness of breath for the past 2 days. She also endorses having burning in her chest. She is at baseline on 3L of oxygen  and she increased it to 4L due to the worsening shortness of breath. She has COPD and sees pulmonology. She called them and they advised she come here.

## 2023-12-31 DIAGNOSIS — J45909 Unspecified asthma, uncomplicated: Secondary | ICD-10-CM | POA: Diagnosis not present

## 2023-12-31 DIAGNOSIS — D869 Sarcoidosis, unspecified: Secondary | ICD-10-CM | POA: Diagnosis not present

## 2023-12-31 DIAGNOSIS — J449 Chronic obstructive pulmonary disease, unspecified: Secondary | ICD-10-CM | POA: Diagnosis not present

## 2023-12-31 DIAGNOSIS — J189 Pneumonia, unspecified organism: Secondary | ICD-10-CM | POA: Diagnosis not present

## 2023-12-31 NOTE — Telephone Encounter (Signed)
 I called and spoke with patient, she was given Doxycycline , nebs and potassium in the ER on 12/1.  I scheduled her to f/u with Dr. Annella on 12/11 after she completes the Doxycycline  and Prednisone  to make sure she is continuing to improve.  She was ordered a nebulizer machine.  I am unsure where the order went.  I advised her I would call [employer] and see if it was sent to them so we can get her the machine.  She verbalized understanding.  Nothing further needed.    I left a detailed message for Brad with [employer] to see if that order went to them since she is established with them for oxygen .  I asked that he either give me a call back or send me an e-mail so we can get her this neb machine as hers is not working and she is in a COPD flare with bronchitis as well.

## 2023-12-31 NOTE — Telephone Encounter (Signed)
 I spoke with a rep from [employer] and they are working on getting the nebulizer today, they will call patient.  Nothing further needed.

## 2023-12-31 NOTE — Telephone Encounter (Signed)
 She was seen in the ED on 12/1 for COPD exacerbation, acute bronchitis and hypokalemia.  She was discharged home with the following:    doxycycline (VIBRAMYCIN) 100 MG capsule  2 times daily        12/30/23 2221       predniSONE  (DELTASONE ) 50 MG tablet  Daily with breakfast        12/30/23 2221      For home use only DME Nebulizer machine            She has a f/u scheduled with her pcp on 12/30.

## 2023-12-31 NOTE — Telephone Encounter (Signed)
 Duplicate.  See other message.  Nothing further needed.

## 2024-01-01 DIAGNOSIS — J449 Chronic obstructive pulmonary disease, unspecified: Secondary | ICD-10-CM | POA: Diagnosis not present

## 2024-01-02 DIAGNOSIS — J449 Chronic obstructive pulmonary disease, unspecified: Secondary | ICD-10-CM | POA: Diagnosis not present

## 2024-01-03 DIAGNOSIS — J449 Chronic obstructive pulmonary disease, unspecified: Secondary | ICD-10-CM | POA: Diagnosis not present

## 2024-01-04 DIAGNOSIS — J449 Chronic obstructive pulmonary disease, unspecified: Secondary | ICD-10-CM | POA: Diagnosis not present

## 2024-01-05 DIAGNOSIS — J449 Chronic obstructive pulmonary disease, unspecified: Secondary | ICD-10-CM | POA: Diagnosis not present

## 2024-01-06 DIAGNOSIS — J449 Chronic obstructive pulmonary disease, unspecified: Secondary | ICD-10-CM | POA: Diagnosis not present

## 2024-01-08 DIAGNOSIS — J449 Chronic obstructive pulmonary disease, unspecified: Secondary | ICD-10-CM | POA: Diagnosis not present

## 2024-01-09 ENCOUNTER — Encounter: Payer: Self-pay | Admitting: Pulmonary Disease

## 2024-01-09 ENCOUNTER — Telehealth: Payer: Self-pay

## 2024-01-09 ENCOUNTER — Ambulatory Visit: Admitting: Pulmonary Disease

## 2024-01-09 VITALS — BP 145/93 | HR 88 | Temp 98.1°F | Ht 64.0 in | Wt 279.8 lb

## 2024-01-09 DIAGNOSIS — Z862 Personal history of diseases of the blood and blood-forming organs and certain disorders involving the immune mechanism: Secondary | ICD-10-CM

## 2024-01-09 DIAGNOSIS — J4551 Severe persistent asthma with (acute) exacerbation: Secondary | ICD-10-CM | POA: Diagnosis not present

## 2024-01-09 DIAGNOSIS — J439 Emphysema, unspecified: Secondary | ICD-10-CM

## 2024-01-09 DIAGNOSIS — Z87891 Personal history of nicotine dependence: Secondary | ICD-10-CM

## 2024-01-09 DIAGNOSIS — J8283 Eosinophilic asthma: Secondary | ICD-10-CM | POA: Diagnosis not present

## 2024-01-09 DIAGNOSIS — J9611 Chronic respiratory failure with hypoxia: Secondary | ICD-10-CM | POA: Diagnosis not present

## 2024-01-09 DIAGNOSIS — Z5912 Inadequate housing utilities: Secondary | ICD-10-CM

## 2024-01-09 DIAGNOSIS — J449 Chronic obstructive pulmonary disease, unspecified: Secondary | ICD-10-CM | POA: Diagnosis not present

## 2024-01-09 DIAGNOSIS — J4489 Other specified chronic obstructive pulmonary disease: Secondary | ICD-10-CM

## 2024-01-09 DIAGNOSIS — J9601 Acute respiratory failure with hypoxia: Secondary | ICD-10-CM

## 2024-01-09 MED ORDER — PREDNISONE 20 MG PO TABS: ORAL_TABLET | ORAL | 0 refills | Status: AC

## 2024-01-09 NOTE — Patient Instructions (Signed)
 Next you again  I sent a referral to the foot doctor given that spot on your ankle  Take prednisone , new prescription as prescribed.  Finish the prednisone  you have at home to take this after.  If things are not improving by early next week, send me a message we may need to do a CT scan to evaluate for blood clot.  Return to clinic in 3 months or sooner as needed with Dr. Annella

## 2024-01-09 NOTE — Progress Notes (Signed)
 Synopsis: Referred in 2021 for asthma by Theotis Haze ORN, NP. Formerly a patient of Dr. Corrie in 2009.  Subjective:   PATIENT ID: Teresa Mendez GENDER: female DOB: 02-Mar-1975, MRN: [id number]  Chief Complaint  Patient presents with   Hospitalization Follow-up    Hospital follow up,sob,weak muscles,insides feel sore,fatigue    Teresa Mendez is a 48 y.o. woman who presents for follow-up of chronic hypoxic respiratory failure due to COPD and sarcoidosis (previously biopsy-proven in 2009).  Also with asthma (sig BD response) that responds well to prednisone , worsens with outdoor activities.  She  continues on  triple inhaled therapy.  She still using 3 L supplemental oxygen  at rest, 5L with exertion.   Here for hospital follow-up.  Became acutely ill late last month.  Presented to the ED 12/30/2023.  ED note reviewed.  Chest x-ray clear.  Wheezy more hypoxemic increased to 4 L at rest.  Was given breathing treatment, DuoNeb.  Ambulated and back to baseline oxygen  use.  Prescribed prednisone  and doxycycline .  She still not feeling better.  Achy sore short of breath.  A little bit better but not significant improved.  Returns out her mail order pharmacy delivered things a bit late.  She still has not finished her 5 days of prednisone  prescribed 12/30/2023 per her report.  On exam lung sounds are distant but clear.   Past Medical History:  Diagnosis Date   Asthma    Hypertension    Sarcoidosis    Sickle cell trait    Stress incontinence      Family History  Problem Relation Age of Onset   Hypertension Mother    Congestive Heart Failure Mother    Kidney failure Mother    Sickle cell trait Mother    Hypertension Father    Congestive Heart Failure Father    Kidney failure Father    Glaucoma Father    Sickle cell trait Daughter    Sickle cell trait Daughter    Breast cancer Paternal Grandmother    Hypertension Brother    Liver disease Brother    Sickle cell trait Son      Past  Surgical History:  Procedure Laterality Date   BREAST BIOPSY Right 2021   BREAST SURGERY Right    biopsy   TUBAL LIGATION      Social History   Socioeconomic History   Marital status: Single    Spouse name: Not on file   Number of children: Not on file   Years of education: Not on file   Highest education level: Some college, no degree  Occupational History   Not on file  Tobacco Use   Smoking status: Former    Current packs/day: 0.00    Average packs/day: 0.5 packs/day    Types: Cigarettes    Quit date: 01/02/2019    Years since quitting: 5.0    Passive exposure: Current   Smokeless tobacco: Never  Vaping Use   Vaping status: Former  Substance and Sexual Activity   Alcohol use: Not Currently    Comment: Occasionally    Drug use: No   Sexual activity: Not Currently  Other Topics Concern   Not on file  Social History Narrative   Not on file   Social Drivers of Health   Tobacco Use: Medium Risk (01/09/2024)   Patient History    Smoking Tobacco Use: Former    Smokeless Tobacco Use: Never    Passive Exposure: Current  Physicist, Medical Strain: Low Risk (  02/18/2023)   Overall Financial Resource Strain (CARDIA)    Difficulty of Paying Living Expenses: Not hard at all  Food Insecurity: Food Insecurity Present (02/18/2023)   Hunger Vital Sign    Worried About Running Out of Food in the Last Year: Sometimes true    Ran Out of Food in the Last Year: Sometimes true  Transportation Needs: No Transportation Needs (02/18/2023)   PRAPARE - Administrator, Civil Service (Medical): No    Lack of Transportation (Non-Medical): No  Physical Activity: Inactive (02/18/2023)   Exercise Vital Sign    Days of Exercise per Week: 0 days    Minutes of Exercise per Session: 0 min  Stress: No Stress Concern Present (02/18/2023)   [employer] of Occupational Health - Occupational Stress Questionnaire    Feeling of Stress : Not at all  Social Connections: Moderately  Integrated (02/18/2023)   Social Connection and Isolation Panel    Frequency of Communication with Friends and Family: More than three times a week    Frequency of Social Gatherings with Friends and Family: More than three times a week    Attends Religious Services: 1 to 4 times per year    Active Member of Clubs or Organizations: Yes    Attends Banker Meetings: 1 to 4 times per year    Marital Status: Divorced  Intimate Partner Violence: Not At Risk (02/18/2023)   Humiliation, Afraid, Rape, and Kick questionnaire    Fear of Current or Ex-Partner: No    Emotionally Abused: No    Physically Abused: No    Sexually Abused: No  Depression (PHQ2-9): Low Risk (10/23/2023)   Depression (PHQ2-9)    PHQ-2 Score: 0  Alcohol Screen: Low Risk (02/18/2023)   Alcohol Screen    Last Alcohol Screening Score (AUDIT): 0  Housing: Low Risk (02/18/2023)   Housing Stability Vital Sign    Unable to Pay for Housing in the Last Year: No    Number of Times Moved in the Last Year: 0    Homeless in the Last Year: No  Utilities: Not At Risk (02/18/2023)   AHC Utilities    Threatened with loss of utilities: No  Health Literacy: Inadequate Health Literacy (02/18/2023)   B1300 Health Literacy    Frequency of need for help with medical instructions: Often     No Known Allergies   Immunization History  Administered Date(s) Administered   Influenza, Seasonal, Injecte, Preservative Fre 02/18/2023, 10/23/2023   Influenza,inj,Quad PF,6+ Mos 10/13/2019   PFIZER(Purple Top)SARS-COV-2 Vaccination 05/21/2020, 06/16/2020   PNEUMOCOCCAL CONJUGATE-20 10/23/2023    Outpatient Medications Prior to Visit  Medication Sig Dispense Refill   albuterol  (PROVENTIL ) (2.5 MG/3ML) 0.083% nebulizer solution Take 3 mLs by nebulization every 6 (six) hours as needed for wheezing or shortness of breath. 150 mL 6   albuterol  (VENTOLIN  HFA) 108 (90 Base) MCG/ACT inhaler Inhale 1-2 puffs into the lungs every 6 (six) hours as  needed for wheezing or shortness of breath. 18 g 0   amLODipine  (NORVASC ) 10 MG tablet Take 1 tablet (10 mg total) by mouth daily. 90 tablet 1   aspirin  EC 81 MG tablet Take 81 mg by mouth daily.     atorvastatin  (LIPITOR) 80 MG tablet Take 1 tablet (80 mg total) by mouth daily. 90 tablet 1   Blood Pressure Monitor DEVI Please provide patient with insurance approved blood pressure monitor I10.0 1 each 0   Budeson-Glycopyrrol-Formoterol  (BREZTRI  AEROSPHERE) 160-9-4.8 MCG/ACT AERO Inhale 2 puffs  into the lungs in the morning and at bedtime. 10.7 g 11   carvedilol  (COREG ) 6.25 MG tablet Take 1 tablet (6.25 mg total) by mouth 2 (two) times daily with a meal. 180 tablet 1   cetirizine  (ZYRTEC ) 10 MG tablet Take 1 tablet (10 mg total) by mouth daily. 90 tablet 1   diclofenac  Sodium (VOLTAREN ) 1 % GEL Apply 2 g topically 4 (four) times daily. 200 g 1   doxycycline  (VIBRAMYCIN ) 100 MG capsule Take 1 capsule (100 mg total) by mouth 2 (two) times daily. 14 capsule 0   DULoxetine  (CYMBALTA ) 60 MG capsule Take 1 capsule (60 mg total) by mouth daily. 90 capsule 1   famotidine  (PEPCID ) 20 MG tablet Take 1 tablet (20 mg total) by mouth at bedtime. 90 tablet 1   fluticasone  (FLONASE ) 50 MCG/ACT nasal spray Place 2 sprays into both nostrils daily. 16 g 2   hydrochlorothiazide  (HYDRODIURIL ) 25 MG tablet Take 1 tablet (25 mg total) by mouth daily. 90 tablet 1   lisinopril  (ZESTRIL ) 40 MG tablet Take 1 tablet (40 mg total) by mouth daily. 90 tablet 1   Masks MISC Nebulizer face mask J45.909 1 each 0   Misc. Devices (PULSE OXIMETER FOR FINGER) MISC Use as instructed J44.89 1 each 0   montelukast  (SINGULAIR ) 10 MG tablet Take 1 tablet (10 mg total) by mouth at bedtime. 90 tablet 1   olopatadine  ([employer]S OLOPATADINE  HCL) 0.1 % ophthalmic solution Place 1 drop into both eyes 2 (two) times daily. 5 mL 12   omeprazole  (PRILOSEC) 20 MG capsule Take 1 capsule (20 mg total) by mouth daily. 90 capsule 1   OXYGEN  Inhale into the  lungs. 3 liters     potassium chloride  SA (KLOR-CON  M) 20 MEQ tablet Take 1 tablet (20 mEq total) by mouth daily. 90 tablet 1   triamcinolone  ointment (KENALOG ) 0.1 % Apply 1 Application topically 2 (two) times daily. 60 g 3   Vitamin D , Ergocalciferol , (DRISDOL ) 1.25 MG (50000 UNIT) CAPS capsule Take 1 capsule (50,000 Units total) by mouth every 7 (seven) days. 12 capsule 1   predniSONE  (DELTASONE ) 50 MG tablet Take 1 tablet (50 mg total) by mouth daily with breakfast. 5 tablet 0   Semaglutide -Weight Management (WEGOVY ) 0.25 MG/0.5ML SOAJ Inject 0.5 mg into the skin once a week. (Patient not taking: Reported on 01/09/2024) 2 mL 1   albuterol  (PROVENTIL ) (2.5 MG/3ML) 0.083% nebulizer solution TAKE 3 ML by nebulization EVERY 6 hours AS NEEDED FOR wheezing OR SHORTNESS OF BREATH 150 mL 0   No facility-administered medications prior to visit.      Objective:   Vitals:   01/09/24 1311  BP: (!) 145/93  Pulse: 88  Temp: 98.1 F (36.7 C)  TempSrc: Oral  SpO2: 95%  Weight: 279 lb 12.8 oz (126.9 kg)  Height: 5' 4 (1.626 m)   95% on 3 LPM    (83% on RA) BMI Readings from Last 3 Encounters:  01/09/24 48.03 kg/m  12/30/23 46.17 kg/m  10/23/23 46.24 kg/m   Wt Readings from Last 3 Encounters:  01/09/24 279 lb 12.8 oz (126.9 kg)  12/30/23 269 lb (122 kg)  10/23/23 269 lb 6.4 oz (122.2 kg)    Physical exam: General: Chronically ill-appearing, sitting in chair Eyes: EOMI Pulmonary: Distant, clear Abdomen: Distended MSK: Right lateral malleolus thickened skin with central dimpled lesion, no drainage  CBC    Component Value Date/Time   WBC 11.5 (H) 12/30/2023 1948   RBC 4.41 12/30/2023 1948  HGB 10.4 (L) 12/30/2023 1948   HGB 11.6 10/23/2023 1141   HCT 35.2 (L) 12/30/2023 1948   HCT 38.4 10/23/2023 1141   PLT 303 12/30/2023 1948   PLT 355 10/23/2023 1141   MCV 79.8 (L) 12/30/2023 1948   MCV 81 10/23/2023 1141   MCH 23.6 (L) 12/30/2023 1948   MCHC 29.5 (L) 12/30/2023 1948    RDW 14.2 12/30/2023 1948   RDW 13.7 10/23/2023 1141   LYMPHSABS 2.2 10/23/2023 1141   MONOABS 0.4 05/22/2019 0702   EOSABS 0.1 10/23/2023 1141   BASOSABS 0.0 10/23/2023 1141    CHEMISTRY No results for input(s): NA, K, CL, CO2, GLUCOSE, BUN, CREATININE, CALCIUM , MG, PHOS in the last 168 hours. Estimated Creatinine Clearance: 111 mL/min (by C-G formula based on SCr of 0.79 mg/dL).   Pulmonary Functions Testing Results:    Latest Ref Rng & Units 07/03/2019    2:52 PM  PFT Results  FVC-Pre L 1.89   FVC-Predicted Pre % 60   FVC-Post L 2.41   FVC-Predicted Post % 76   Pre FEV1/FVC % % 47   Post FEV1/FCV % % 50   FEV1-Pre L 0.89   FEV1-Predicted Pre % 34   FEV1-Post L 1.20   DLCO uncorrected ml/min/mmHg 17.25   DLCO UNC% % 76   DLCO corrected ml/min/mmHg 19.39   DLCO COR %Predicted % 86   DLVA Predicted % 116   TLC L 4.65   TLC % Predicted % 88   RV % Predicted % 140    2021- severe obstruction with significant bronchodilator reversibility.  Air trapping without hyperinflation or restriction.  Mild diffusion impairment.  Flow volume loop supports mixed obstruction and restriction.  Echocardiogram 05/18/19: LVEF 60-65%, mild LVH, indeterminate diastolic function. Mildly dilated LA. Normal RV function. Trivial AI. Ascending aortic aneurysm.      Assessment & Plan:     ICD-10-CM   1. Asthma-COPD overlap syndrome (HCC)  J44.89     2. Emphysema lung (HCC)  J43.9     3. Acute respiratory failure with hypoxia (HCC)  J96.01       Chronic hypoxic respiratory failure- likely multifactorial- sarcoid, COPD/emphysema, asthma:  -Continue Breztri   Sarcoid: Burned-out, no evidence of active disease on prior imaging.  Severe persistent allergic (eosinophilic) asthma; likely COPD overlap with exacerbation -Additional prednisone  taper, competing doxycycline  -Breztri  as above -Continue montelukast  -albuterol  PRN -Consider biologic therapy if develops another  exacerbation on ICS/LABA/LAMA   RTC in 3 months.   Current Outpatient Medications:    albuterol  (PROVENTIL ) (2.5 MG/3ML) 0.083% nebulizer solution, Take 3 mLs by nebulization every 6 (six) hours as needed for wheezing or shortness of breath., Disp: 150 mL, Rfl: 6   albuterol  (VENTOLIN  HFA) 108 (90 Base) MCG/ACT inhaler, Inhale 1-2 puffs into the lungs every 6 (six) hours as needed for wheezing or shortness of breath., Disp: 18 g, Rfl: 0   amLODipine  (NORVASC ) 10 MG tablet, Take 1 tablet (10 mg total) by mouth daily., Disp: 90 tablet, Rfl: 1   aspirin  EC 81 MG tablet, Take 81 mg by mouth daily., Disp: , Rfl:    atorvastatin  (LIPITOR) 80 MG tablet, Take 1 tablet (80 mg total) by mouth daily., Disp: 90 tablet, Rfl: 1   Blood Pressure Monitor DEVI, Please provide patient with insurance approved blood pressure monitor I10.0, Disp: 1 each, Rfl: 0   Budeson-Glycopyrrol-Formoterol  (BREZTRI  AEROSPHERE) 160-9-4.8 MCG/ACT AERO, Inhale 2 puffs into the lungs in the morning and at bedtime., Disp: 10.7 g, Rfl: 11  carvedilol  (COREG ) 6.25 MG tablet, Take 1 tablet (6.25 mg total) by mouth 2 (two) times daily with a meal., Disp: 180 tablet, Rfl: 1   cetirizine  (ZYRTEC ) 10 MG tablet, Take 1 tablet (10 mg total) by mouth daily., Disp: 90 tablet, Rfl: 1   diclofenac  Sodium (VOLTAREN ) 1 % GEL, Apply 2 g topically 4 (four) times daily., Disp: 200 g, Rfl: 1   doxycycline  (VIBRAMYCIN ) 100 MG capsule, Take 1 capsule (100 mg total) by mouth 2 (two) times daily., Disp: 14 capsule, Rfl: 0   DULoxetine  (CYMBALTA ) 60 MG capsule, Take 1 capsule (60 mg total) by mouth daily., Disp: 90 capsule, Rfl: 1   famotidine  (PEPCID ) 20 MG tablet, Take 1 tablet (20 mg total) by mouth at bedtime., Disp: 90 tablet, Rfl: 1   fluticasone  (FLONASE ) 50 MCG/ACT nasal spray, Place 2 sprays into both nostrils daily., Disp: 16 g, Rfl: 2   hydrochlorothiazide  (HYDRODIURIL ) 25 MG tablet, Take 1 tablet (25 mg total) by mouth daily., Disp: 90 tablet,  Rfl: 1   lisinopril  (ZESTRIL ) 40 MG tablet, Take 1 tablet (40 mg total) by mouth daily., Disp: 90 tablet, Rfl: 1   Masks MISC, Nebulizer face mask J45.909, Disp: 1 each, Rfl: 0   Misc. Devices (PULSE OXIMETER FOR FINGER) MISC, Use as instructed J44.89, Disp: 1 each, Rfl: 0   montelukast  (SINGULAIR ) 10 MG tablet, Take 1 tablet (10 mg total) by mouth at bedtime., Disp: 90 tablet, Rfl: 1   olopatadine  (CVS OLOPATADINE  HCL) 0.1 % ophthalmic solution, Place 1 drop into both eyes 2 (two) times daily., Disp: 5 mL, Rfl: 12   omeprazole  (PRILOSEC) 20 MG capsule, Take 1 capsule (20 mg total) by mouth daily., Disp: 90 capsule, Rfl: 1   OXYGEN , Inhale into the lungs. 3 liters, Disp: , Rfl:    potassium chloride  SA (KLOR-CON  M) 20 MEQ tablet, Take 1 tablet (20 mEq total) by mouth daily., Disp: 90 tablet, Rfl: 1   predniSONE  (DELTASONE ) 20 MG tablet, Take 2 tablets (40 mg total) by mouth daily with breakfast for 5 days, THEN 1 tablet (20 mg total) daily with breakfast for 5 days., Disp: 15 tablet, Rfl: 0   triamcinolone  ointment (KENALOG ) 0.1 %, Apply 1 Application topically 2 (two) times daily., Disp: 60 g, Rfl: 3   Vitamin D , Ergocalciferol , (DRISDOL ) 1.25 MG (50000 UNIT) CAPS capsule, Take 1 capsule (50,000 Units total) by mouth every 7 (seven) days., Disp: 12 capsule, Rfl: 1   Semaglutide -Weight Management (WEGOVY ) 0.25 MG/0.5ML SOAJ, Inject 0.5 mg into the skin once a week. (Patient not taking: Reported on 01/09/2024), Disp: 2 mL, Rfl: 1   Donnice JONELLE Beals, MD [facility] 01/09/2024 1:47 PM

## 2024-01-11 DIAGNOSIS — J449 Chronic obstructive pulmonary disease, unspecified: Secondary | ICD-10-CM | POA: Diagnosis not present

## 2024-01-12 DIAGNOSIS — J449 Chronic obstructive pulmonary disease, unspecified: Secondary | ICD-10-CM | POA: Diagnosis not present

## 2024-01-13 DIAGNOSIS — J449 Chronic obstructive pulmonary disease, unspecified: Secondary | ICD-10-CM | POA: Diagnosis not present

## 2024-01-14 DIAGNOSIS — J449 Chronic obstructive pulmonary disease, unspecified: Secondary | ICD-10-CM | POA: Diagnosis not present

## 2024-01-17 DIAGNOSIS — J449 Chronic obstructive pulmonary disease, unspecified: Secondary | ICD-10-CM | POA: Diagnosis not present

## 2024-01-18 DIAGNOSIS — J449 Chronic obstructive pulmonary disease, unspecified: Secondary | ICD-10-CM | POA: Diagnosis not present

## 2024-01-19 DIAGNOSIS — J449 Chronic obstructive pulmonary disease, unspecified: Secondary | ICD-10-CM | POA: Diagnosis not present

## 2024-01-27 ENCOUNTER — Telehealth: Payer: Self-pay | Admitting: Nurse Practitioner

## 2024-01-27 ENCOUNTER — Telehealth: Payer: Self-pay | Admitting: Pulmonary Disease

## 2024-01-27 DIAGNOSIS — M899 Disorder of bone, unspecified: Secondary | ICD-10-CM

## 2024-01-27 NOTE — Telephone Encounter (Signed)
 Per lov I sent a referral to the foot doctor given that spot on your ankle   Dr. Annella can you verify if I have pended the correct referral you were insisting.

## 2024-01-27 NOTE — Telephone Encounter (Signed)
Yes---order signed.  Thanks

## 2024-01-27 NOTE — Telephone Encounter (Signed)
 Pt confirmed appt

## 2024-01-27 NOTE — Telephone Encounter (Signed)
 Copied from CRM #[id number]. Topic: Referral - Status >> Jan 27, 2024  2:27 PM Teresa Mendez wrote: Reason for CRM: Patient is calling to inquire about foot referral provider stated would be placed at last visit. Note in chart about placing referral present but PAS unable to locate actual referral. Please update patient.

## 2024-01-28 ENCOUNTER — Ambulatory Visit: Admitting: Nurse Practitioner

## 2024-01-28 NOTE — Telephone Encounter (Signed)
 Pt aware referral placed. NFN

## 2024-01-31 ENCOUNTER — Telehealth: Payer: Self-pay | Admitting: Nurse Practitioner

## 2024-01-31 NOTE — Telephone Encounter (Signed)
Contacted pt confirmed appt

## 2024-02-03 ENCOUNTER — Ambulatory Visit: Admitting: Nurse Practitioner

## 2024-02-04 ENCOUNTER — Other Ambulatory Visit: Payer: Self-pay

## 2024-02-04 ENCOUNTER — Telehealth: Payer: Self-pay

## 2024-02-04 ENCOUNTER — Other Ambulatory Visit: Payer: Self-pay | Admitting: *Deleted

## 2024-02-04 NOTE — Telephone Encounter (Signed)
 Fax received from [facility] requesting recertification of PCS.  I called the patient and explained that she needs to be seen by her provider in order to submit the requested documentation because it has been more than 90 days since she was last seen.   I scheduled her with Haze Servant, NP 02/11/2024.   She stated she currently receives PCS 3 hours/day x 7 days/wee from [facility] .

## 2024-02-04 NOTE — Patient Outreach (Signed)
 Complex Care Management   Visit Note  02/04/2024  Name:  Teresa Mendez MRN: [id number] DOB: January 21, 1976  Situation: Referral received for Complex Care Management related to COPD and Asthma and HTN I obtained verbal consent from Patient.  Visit completed with Patient  on the phone  Background:   Past Medical History:  Diagnosis Date   Asthma    Hypertension    Sarcoidosis    Sickle cell trait    Stress incontinence     Assessment: Patient Reported Symptoms:  Cognitive Cognitive Status: Able to follow simple commands, No symptoms reported, Alert and oriented to person, place, and time, Insightful and able to interpret abstract concepts, Normal speech and language skills Cognitive/Intellectual Conditions Management [RPT]: None reported or documented in medical history or problem list   Health Maintenance Behaviors: Annual physical exam, Healthy diet, Sleep adequate, Spiritual practice(s), Stress management Healing Pattern: Slow Health Facilitated by: Healthy diet, Pain control, Prayer/meditation, Rest, Stress management  Neurological Neurological Review of Symptoms: Weakness Neurological Management Strategies: Adequate rest, Coping strategies Neurological Comment: Patient reports that she has weakness due to her breathing conditions.  HEENT HEENT Symptoms Reported: No symptoms reported HEENT Management Strategies: Adequate rest, Routine screening HEENT Self-Management Outcome: 4 (good)    Cardiovascular Cardiovascular Symptoms Reported: No symptoms reported Does patient have uncontrolled Hypertension?: Yes Cardiovascular Management Strategies: Adequate rest, Coping strategies, Routine screening Cardiovascular Self-Management Outcome: 4 (good) Cardiovascular Comment: Patient reports blood pressure reading today of 149/101. Discussed the importance of taking prescribed antihypertensive medications as directed. Provided education on lifestyle modifications, including limiting sodium  intake and avoiding fried and fatty foods. Patient was advised on low-sodium dietary options and the DASH (Dietary Approaches to Stop Hypertension) diet. Educational materials on low-salt foods and the DASH diet will be provided.  Respiratory Respiratory Symptoms Reported: Shortness of breath, Wheezing Other Respiratory Symptoms: Patient reports that she has shortness of breath and wheezing and is currently on 02 at 3L per [place].  Reports that o2 sats is 100% on 3L per [place].  Patient also uses Albuterol  inhaler and nebulizer to assist with shortness of breath. Respiratory Management Strategies: Adequate rest, Coping strategies, Oxygen  therapy, Routine screening Respiratory Self-Management Outcome: 3 (uncertain)  Endocrine Endocrine Symptoms Reported: No symptoms reported Is patient diabetic?: No Endocrine Self-Management Outcome: 4 (good)  Gastrointestinal Gastrointestinal Symptoms Reported: No symptoms reported Gastrointestinal Management Strategies: Adequate rest, Coping strategies Gastrointestinal Self-Management Outcome: 4 (good)    Genitourinary Genitourinary Symptoms Reported: No symptoms reported Genitourinary Management Strategies: Adequate rest Genitourinary Self-Management Outcome: 4 (good)  Integumentary Integumentary Symptoms Reported: Other Other Integumentary Symptoms: Patient reports Callus on her right foot.  Patient was instructed to monitor fo signs of infections such as redness, swelling, pain, and drainage, or skin breakdown and report any concerns promptly.  Patient informs me that she has a podiatry appointment on 02/06/24. Skin Management Strategies: Adequate rest, Coping strategies, Routine screening  Musculoskeletal Musculoskelatal Symptoms Reviewed: Joint pain, Weakness, Difficulty walking Additional Musculoskeletal Details: Patient informs me that she has knee pain bilaterally.  She also informs me that she has back pain.  She reports that she occassionally uses Tylenol  and  Voltren gel for pain.  She is also going to trying applying heat to her back.  Patient reports that pain level is 8/10 on pain scale.  Patient reports that she gets around with a walker. Musculoskeletal Management Strategies: Adequate rest, Coping strategies, Medical device Musculoskeletal Self-Management Outcome: 3 (uncertain) Falls in the past year?: No Number of falls in  past year: 1 or less Was there an injury with Fall?: No Fall Risk Category Calculator: 0 Patient Fall Risk Level: Low Fall Risk Patient at Risk for Falls Due to: No Fall Risks  Psychosocial Psychosocial Symptoms Reported: No symptoms reported     Quality of Family Relationships: helpful, involved, supportive Do you feel physically threatened by others?: No    02/04/2024    PHQ2-9 Depression Screening   Little interest or pleasure in doing things Not at all  Feeling down, depressed, or hopeless Not at all  PHQ-2 - Total Score 0  Trouble falling or staying asleep, or sleeping too much    Feeling tired or having little energy    Poor appetite or overeating     Feeling bad about yourself - or that you are a failure or have let yourself or your family down    Trouble concentrating on things, such as reading the newspaper or watching television    Moving or speaking so slowly that other people could have noticed.  Or the opposite - being so fidgety or restless that you have been moving around a lot more than usual    Thoughts that you would be better off dead, or hurting yourself in some way    PHQ2-9 Total Score    If you checked off any problems, how difficult have these problems made it for you to do your work, take care of things at home, or get along with other people    Depression Interventions/Treatment      Today's Vitals   02/04/24 1602  BP: (!) 149/101   Pain Scale: 0-10 Pain Score: 8  Pain Type: Chronic pain Pain Location: Back (Patient also reports pain in both knees.) Pain Orientation: Mid Pain  Descriptors / Indicators: Aching Pain Onset: On-going Patients Stated Pain Goal: 8 Pain Intervention(s): Hot/Cold interventions  Medications Reviewed Today     Reviewed by Jorja Nichole LABOR, RN (Case Manager) on 02/04/24 at 1625  Med List Status: <None>   Medication Order Taking? Sig Documenting Provider Last Dose Status Informant  albuterol  (PROVENTIL ) (2.5 MG/3ML) 0.083% nebulizer solution [id number] Yes Take 3 mLs by nebulization every 6 (six) hours as needed for wheezing or shortness of breath. Theotis Haze ORN, NP  Active   albuterol  (VENTOLIN  HFA) 108 (90 Base) MCG/ACT inhaler [id number] Yes Inhale 1-2 puffs into the lungs every 6 (six) hours as needed for wheezing or shortness of breath. Theotis Haze ORN, NP  Active   amLODipine  (NORVASC ) 10 MG tablet [id number] Yes Take 1 tablet (10 mg total) by mouth daily. Theotis Haze ORN, NP  Active   aspirin  EC 81 MG tablet [id number]  Take 81 mg by mouth daily.  Patient not taking: Reported on 02/04/2024   [provider]  Active   atorvastatin  (LIPITOR) 80 MG tablet [id number] Yes Take 1 tablet (80 mg total) by mouth daily. Theotis Haze ORN, NP  Active   Blood Pressure Monitor DEVI [id number] Yes Please provide patient with insurance approved blood pressure monitor I10.0 Theotis Haze ORN, NP  Active   Budeson-Glycopyrrol-Formoterol  (BREZTRI  AEROSPHERE) 160-9-4.8 MCG/ACT AERO [id number] Yes Inhale 2 puffs into the lungs in the morning and at bedtime. Fleming, Zelda W, NP  Active   carvedilol  (COREG ) 6.25 MG tablet [id number] Yes Take 1 tablet (6.25 mg total) by mouth 2 (two) times daily with a meal. Theotis Haze ORN, NP  Active   cetirizine  (ZYRTEC ) 10 MG tablet [id number] Yes Take 1 tablet (10 mg total) by mouth  daily. Theotis Haze ORN, NP  Active   diclofenac  Sodium (VOLTAREN ) 1 % GEL [id number]  Apply 2 g topically 4 (four) times daily.  Patient not taking: Reported on 02/04/2024   Fleming, Zelda W, NP  Active   doxycycline  (VIBRAMYCIN ) 100 MG  capsule [id number] Yes Take 1 capsule (100 mg total) by mouth 2 (two) times daily. Haviland, Julie, MD  Active   DULoxetine  (CYMBALTA ) 60 MG capsule [id number] Yes Take 1 capsule (60 mg total) by mouth daily. Theotis Haze ORN, NP  Active   famotidine  (PEPCID ) 20 MG tablet [id number] Yes Take 1 tablet (20 mg total) by mouth at bedtime. Theotis Haze ORN, NP  Active   fluticasone  (FLONASE ) 50 MCG/ACT nasal spray [id number] Yes Place 2 sprays into both nostrils daily. Theotis Haze ORN, NP  Active   hydrochlorothiazide  (HYDRODIURIL ) 25 MG tablet [id number] Yes Take 1 tablet (25 mg total) by mouth daily. Theotis Haze ORN, NP  Active   lisinopril  (ZESTRIL ) 40 MG tablet [id number] Yes Take 1 tablet (40 mg total) by mouth daily. Theotis Haze ORN, NP  Active   Masks MISC [id number]  Nebulizer face mask J45.909 Theotis Haze ORN, NP  Active   Misc. Devices (PULSE OXIMETER FOR FINGER) MISC [id number] Yes Use as instructed J44.89 Fleming, Zelda W, NP  Active   montelukast  (SINGULAIR ) 10 MG tablet [id number] Yes Take 1 tablet (10 mg total) by mouth at bedtime. Theotis Haze ORN, NP  Active   olopatadine  ([employer] OLOPATADINE  HCL) 0.1 % ophthalmic solution [id number] Yes Place 1 drop into both eyes 2 (two) times daily. Rising, [place], PA-C  Active   omeprazole  (PRILOSEC) 20 MG capsule [id number] Yes Take 1 capsule (20 mg total) by mouth daily. Theotis Haze ORN, NP  Active   OXYGEN  [id number] Yes Inhale into the lungs. 3 liters [provider]  Active   potassium chloride  SA (KLOR-CON  M) 20 MEQ tablet [id number] Yes Take 1 tablet (20 mEq total) by mouth daily. Theotis Haze ORN, NP  Active   Semaglutide -Weight Management (WEGOVY ) 0.25 MG/0.5ML SOAJ [id number]  Inject 0.5 mg into the skin once a week.  Patient not taking: Reported on 02/04/2024   Fleming, Zelda W, NP  Active   triamcinolone  ointment (KENALOG ) 0.1 % [id number] Yes Apply 1 Application topically 2 (two) times daily. Theotis Haze ORN, NP  Active   Vitamin D ,  Ergocalciferol , (DRISDOL ) 1.25 MG (50000 UNIT) CAPS capsule [id number] Yes Take 1 capsule (50,000 Units total) by mouth every 7 (seven) days. Theotis Haze ORN, NP  Active             Recommendation:   PCP Follow-up Specialty provider follow-up : Podiatry- 02/06/24; Pulmonary-04/08/24  Continue Current Plan of Care  Follow Up Plan:   Telephone follow-up in 1 month: 03/06/24 @ 1:30 pm.  Ayannah Faddis, RN, BSN, ACM RN Care Manager [facility] [phone]

## 2024-02-04 NOTE — Patient Instructions (Signed)
 Visit Information  Teresa Mendez was given information about Medicaid Managed Care team care coordination services as a part of their [facility] Community Plan Medicaid benefit.   If you would like to schedule transportation through your [facility], please call the following number at least 2 days in advance of your appointment: [phone]   Rides for urgent appointments can also be made after hours by calling Member Services.  Call the Behavioral Health Crisis Line at [phone], at any time, 24 hours a day, 7 days a week. If you are in danger or need immediate medical attention call 911.  Please see education materials related  provided by MyChart link.  Care plan and visit instructions communicated with the patient verbally today. Patient agrees to receive a copy in MyChart. Active MyChart status and patient understanding of how to access instructions and care plan via MyChart confirmed with patient.     Telephone follow up appointment with Managed Medicaid care management team member scheduled for: 03/06/24 @ 1:30 pm.  Trinaty Bundrick, RN, BSN, ACM RN Care Manager [facility] [phone]   Following is a copy of your plan of care:   Goals Addressed             This Visit's Progress    VBCI RN Care Plan-Asthma/COPD       Problems:  Chronic Disease Management support and education needs related to Asthma and COPD  Goal: Over the next 90 days the Patient will attend all scheduled medical appointments: PCP and Specialist as evidenced by keeping all schedule appointments.         continue to work with Medical Illustrator and/or Social Worker to address care management and care coordination needs related to Asthma and COPD as evidenced by adherence to care management team scheduled appointments     take all medications exactly as prescribed and will call provider for medication related questions as evidenced by compliance    verbalize basic  understanding of Asthma and COPD disease process and self health management plan as evidenced by verbal explanation, recognize, monitor symptoms and life style changes.    Interventions:   Asthma: Provided patient with basic written and verbal Asthma education on self care/management/and exacerbation prevention Advised patient to track and manage Asthma triggers Provided instruction about proper use of medications used for management of Asthma including inhalers Advised patient to self assesses Asthma action plan zone and make appointment with provider if in the yellow zone for 48 hours without improvement Provided education about and advised patient to utilize infection prevention strategies to reduce risk of respiratory infection Discussed the importance of adequate rest and management of fatigue with Asthma Screening for signs and symptoms of depression related to chronic disease state  Assessed social determinant of health barriers    COPD Interventions: Advised patient to track and manage COPD triggers Advised patient to self assesses COPD action plan zone and make appointment with provider if in the yellow zone for 48 hours without improvement Assessed social determinant of health barriers Discussed the importance of adequate rest and management of fatigue with COPD Provided education about and advised patient to utilize infection prevention strategies to reduce risk of respiratory infection Provided instruction about proper use of medications used for management of COPD including inhalers Screening for signs and symptoms of depression related to chronic disease state  Use of home oxygen   Patient Self-Care Activities:  Attend all scheduled provider appointments Call pharmacy for medication refills 3-7 days  in advance of running out of medications Call provider office for new concerns or questions  Take medications as prescribed   eliminate smoking in my home identify and avoid  work-related triggers identify and remove indoor air pollutants limit outdoor activity during cold weather listen for public air quality announcements every day eliminate symptom triggers at home follow rescue plan if symptoms flare-up use an extra pillow to sleep get at least 7 to 8 hours of sleep at night use devices that will help like a cane, sock-puller or reacher  Plan:  Telephone follow up appointment with care management team member scheduled for:  03/06/24 @ 1:30 pm.          VBCI RN Care Plan-HTN       Problems:  Chronic Disease Management support and education needs related to HTN  Goal: Over the next 90  days the Patient will attend all scheduled medical appointments: PCP and Specialist as evidenced by keeping all schedule appointments.          continue to work with Medical Illustrator and/or Social Worker to address care management and care coordination needs related to HTN as evidenced by adherence to care management team scheduled appointments     take all medications exactly as prescribed and will call provider for medication related questions as evidenced by compliance.      verbalize basic understanding of HTN disease process and self health management plan as evidenced by verbal explanation, recognize, monitor symptoms and life style changes.    Interventions:   Hypertension Interventions: Last practice recorded BP readings:  BP Readings from Last 3 Encounters:  02/04/24 (!) 149/101  01/09/24 (!) 145/93  12/30/23 (!) 173/106   Most recent eGFR/CrCl:  Lab Results  Component Value Date   EGFR 85 10/23/2023    No components found for: CRCL  Evaluation of current treatment plan related to hypertension self management and patient's adherence to plan as established by provider Provided education to patient re: stroke prevention, s/s of heart attack and stroke Reviewed medications with patient and discussed importance of compliance Discussed plans with patient  for ongoing care management follow up and provided patient with direct contact information for care management team Advised patient, providing education and rationale, to monitor blood pressure daily and record, calling PCP for findings outside established parameters Provided education on prescribed diet Dash Diet Discussed complications of poorly controlled blood pressure such as heart disease, stroke, circulatory complications, vision complications, kidney impairment, sexual dysfunction Screening for signs and symptoms of depression related to chronic disease state  Assessed social determinant of health barriers  Patient Self-Care Activities:  Attend all scheduled provider appointments Attend church or other social activities Call pharmacy for medication refills 3-7 days in advance of running out of medications Call provider office for new concerns or questions  Take medications as prescribed   check blood pressure 3 times per week write blood pressure results in a log or diary learn about high blood pressure keep a blood pressure log take blood pressure log to all doctor appointments call doctor for signs and symptoms of high blood pressure keep all doctor appointments take medications for blood pressure exactly as prescribed report new symptoms to your doctor eat more whole grains, fruits and vegetables, lean meats and healthy fats limit salt intake to 1500 mg/day  Plan:  Telephone follow up appointment with care management team member scheduled for:  03/06/24 @ 1:30 pm.  Visit Information  Teresa Mendez was given information about Medicaid Managed Care team care coordination services as a part of their [facility] Community Plan Medicaid benefit.   If you would like to schedule transportation through your [facility], please call the following number at least 2 days in advance of your appointment: [phone]   Rides for urgent appointments  can also be made after hours by calling Member Services.  Call the Behavioral Health Crisis Line at [phone], at any time, 24 hours a day, 7 days a week. If you are in danger or need immediate medical attention call 911.  Please see education materials related to HTN, Asthma and COPD provided by MyChart link.  Care plan and visit instructions communicated with the patient verbally today. Patient agrees to receive a copy in MyChart. Active MyChart status and patient understanding of how to access instructions and care plan via MyChart confirmed with patient.     Telephone follow up appointment with Managed Medicaid care management team member scheduled for: 03/06/24 @ 1:30 pm.  Peola Joynt, RN, BSN, ACM RN Care Manager [facility] [phone]   Following is a copy of your plan of care:   Goals Addressed             This Visit's Progress    VBCI RN Care Plan-Asthma/COPD       Problems:  Chronic Disease Management support and education needs related to Asthma and COPD  Goal: Over the next 90 days the Patient will attend all scheduled medical appointments: PCP and Specialist as evidenced by keeping all schedule appointments.         continue to work with Medical Illustrator and/or Social Worker to address care management and care coordination needs related to Asthma and COPD as evidenced by adherence to care management team scheduled appointments     take all medications exactly as prescribed and will call provider for medication related questions as evidenced by compliance    verbalize basic understanding of Asthma and COPD disease process and self health management plan as evidenced by verbal explanation, recognize, monitor symptoms and life style changes.    Interventions:   Asthma: Provided patient with basic written and verbal Asthma education on self care/management/and exacerbation prevention Advised patient to track and manage Asthma triggers Provided  instruction about proper use of medications used for management of Asthma including inhalers Advised patient to self assesses Asthma action plan zone and make appointment with provider if in the yellow zone for 48 hours without improvement Provided education about and advised patient to utilize infection prevention strategies to reduce risk of respiratory infection Discussed the importance of adequate rest and management of fatigue with Asthma Screening for signs and symptoms of depression related to chronic disease state  Assessed social determinant of health barriers    COPD Interventions: Advised patient to track and manage COPD triggers Advised patient to self assesses COPD action plan zone and make appointment with provider if in the yellow zone for 48 hours without improvement Assessed social determinant of health barriers Discussed the importance of adequate rest and management of fatigue with COPD Provided education about and advised patient to utilize infection prevention strategies to reduce risk of respiratory infection Provided instruction about proper use of medications used for management of COPD including inhalers Screening for signs and symptoms of depression related to chronic disease state  Use of home oxygen   Patient Self-Care Activities:  Attend all scheduled provider appointments Call pharmacy  for medication refills 3-7 days in advance of running out of medications Call provider office for new concerns or questions  Take medications as prescribed   eliminate smoking in my home identify and avoid work-related triggers identify and remove indoor air pollutants limit outdoor activity during cold weather listen for public air quality announcements every day eliminate symptom triggers at home follow rescue plan if symptoms flare-up use an extra pillow to sleep get at least 7 to 8 hours of sleep at night use devices that will help like a cane, sock-puller or  reacher  Plan:  Telephone follow up appointment with care management team member scheduled for:  03/06/24 @ 1:30 pm.          VBCI RN Care Plan-HTN       Problems:  Chronic Disease Management support and education needs related to HTN  Goal: Over the next 90  days the Patient will attend all scheduled medical appointments: PCP and Specialist as evidenced by keeping all schedule appointments.          continue to work with Medical Illustrator and/or Social Worker to address care management and care coordination needs related to HTN as evidenced by adherence to care management team scheduled appointments     take all medications exactly as prescribed and will call provider for medication related questions as evidenced by compliance.      verbalize basic understanding of HTN disease process and self health management plan as evidenced by verbal explanation, recognize, monitor symptoms and life style changes.    Interventions:   Hypertension Interventions: Last practice recorded BP readings:  BP Readings from Last 3 Encounters:  02/04/24 (!) 149/101  01/09/24 (!) 145/93  12/30/23 (!) 173/106   Most recent eGFR/CrCl:  Lab Results  Component Value Date   EGFR 85 10/23/2023    No components found for: CRCL  Evaluation of current treatment plan related to hypertension self management and patient's adherence to plan as established by provider Provided education to patient re: stroke prevention, s/s of heart attack and stroke Reviewed medications with patient and discussed importance of compliance Discussed plans with patient for ongoing care management follow up and provided patient with direct contact information for care management team Advised patient, providing education and rationale, to monitor blood pressure daily and record, calling PCP for findings outside established parameters Provided education on prescribed diet Dash Diet Discussed complications of poorly controlled blood  pressure such as heart disease, stroke, circulatory complications, vision complications, kidney impairment, sexual dysfunction Screening for signs and symptoms of depression related to chronic disease state  Assessed social determinant of health barriers  Patient Self-Care Activities:  Attend all scheduled provider appointments Attend church or other social activities Call pharmacy for medication refills 3-7 days in advance of running out of medications Call provider office for new concerns or questions  Take medications as prescribed   check blood pressure 3 times per week write blood pressure results in a log or diary learn about high blood pressure keep a blood pressure log take blood pressure log to all doctor appointments call doctor for signs and symptoms of high blood pressure keep all doctor appointments take medications for blood pressure exactly as prescribed report new symptoms to your doctor eat more whole grains, fruits and vegetables, lean meats and healthy fats limit salt intake to 1500 mg/day  Plan:  Telephone follow up appointment with care management team member scheduled for:  03/06/24 @ 1:30 pm.

## 2024-02-05 ENCOUNTER — Other Ambulatory Visit: Payer: Self-pay

## 2024-02-05 NOTE — Patient Outreach (Signed)
 Social Drivers of Health  Community Resource and Care Coordination Visit Note   02/05/2024  Name: Teresa Mendez MRN: [id number] DOB:[birth year]  Situation: Referral received for [facility] needs assessment and assistance related to Financial Strain  utility. I obtained verbal consent from Patient.  Visit completed with Patient on the phone.   Background:   SDOH Interventions Today    Flowsheet Row Most Recent Value  SDOH Interventions   Food Insecurity Interventions Intervention Not Indicated  [patient gets FNS and states she is ok with food right now but would like food resources for future use,]  Housing Interventions Intervention Not Indicated  [patient is looking for resources to pay property taxes.]  Transportation Interventions Payor Benefit, Patient Resources (Friends/Family)  [Patient relies on mediciad transportation for medical appointments. Patient ocassionally uses uber when possible.]  Utilities Interventions [employer] Provided  [No disconnection notices at this time but would like utility assistance resources.]     Assessment:   Goals Addressed             This Visit's Progress    BSW Goal       Current SDOH Barriers:  Utility assistance need Food resources  Interventions: Patient interviewed and appropriate screenings performed Referred patient to community resources  Provided patient with information about out of the garden food project, [employer], and [facility] wellness fruits and veggies benefit x6 months. Discussed plans with patient for ongoing follow up and provided patient with direct contact number Advised patient to f/u with [facility] Tax Department regarding current property tax bill and request assistance with paying it or going on an installment plan.  BSW will be mailing out utility/food resources. BSW assisted patient in enrolling into fruits and veggies benefit for 6 months through [facility].  Pt is aware of new transportation benefit through [facility]  for medicaid appts.          Recommendation:   attend all scheduled provider appointments call for transportation assistance at least one week before appointments ask for help if you don't understand your health insurance benefits call and/or follow up with food resources sent via mail for food assistance F/u with [facility] tax Department about property tax assistance or installment options.   Follow Up Plan:   Telephone follow up appointment date/time:  02/19/2024 at 2pm  Laymon Doll, [place]/VBCI - [facility] Social Worker [phone]

## 2024-02-05 NOTE — Patient Instructions (Signed)
 Visit Information  Teresa Mendez was given information about Medicaid Managed Care team care coordination services as a part of their [facility] Community Plan Medicaid benefit.   If you would like to schedule transportation through your [facility], please call the following number at least 2 days in advance of your appointment: [phone]   Rides for urgent appointments can also be made after hours by calling Member Services.  Call the Behavioral Health Crisis Line at [phone], at any time, 24 hours a day, 7 days a week. If you are in danger or need immediate medical attention call 911.   Patient verbalizes understanding of instructions and care plan provided today and agrees to view in MyChart. Active MyChart status and patient understanding of how to access instructions and care plan via MyChart confirmed with patient.     Telephone follow up appointment with Managed Medicaid care management team member scheduled for: 02/19/2024 at 2pm.  Laymon Doll, BSW [place]/VBCI - [facility] Social Worker [phone]   Following is a copy of your plan of care:   Goals Addressed             This Visit's Progress    BSW Goal       Current SDOH Barriers:  Utility assistance need Food resources  Interventions: Patient interviewed and appropriate screenings performed Referred patient to community resources  Provided patient with information about out of the garden food project, [employer], and [facility] wellness fruits and veggies benefit x6 months. Discussed plans with patient for ongoing follow up and provided patient with direct contact number Advised patient to f/u with [facility] Tax Department regarding current property tax bill and request assistance with paying it or going on an installment plan.  BSW will be mailing out utility/food resources. BSW assisted patient in enrolling into fruits and veggies benefit for 6 months through  [facility].  Pt is aware of new transportation benefit through [facility] for medicaid appts.

## 2024-02-06 ENCOUNTER — Ambulatory Visit: Admitting: Podiatry

## 2024-02-09 ENCOUNTER — Other Ambulatory Visit: Payer: Self-pay

## 2024-02-09 ENCOUNTER — Inpatient Hospital Stay (HOSPITAL_COMMUNITY)
Admission: EM | Admit: 2024-02-09 | Discharge: 2024-02-13 | DRG: 190 | Disposition: A | Discharge status: Home / Self Care | Attending: Family Medicine | Admitting: Family Medicine

## 2024-02-09 ENCOUNTER — Emergency Department (HOSPITAL_COMMUNITY)

## 2024-02-09 ENCOUNTER — Encounter (HOSPITAL_COMMUNITY): Payer: Self-pay | Admitting: *Deleted

## 2024-02-09 DIAGNOSIS — E876 Hypokalemia: Secondary | ICD-10-CM | POA: Diagnosis present

## 2024-02-09 DIAGNOSIS — Z832 Family history of diseases of the blood and blood-forming organs and certain disorders involving the immune mechanism: Secondary | ICD-10-CM | POA: Diagnosis not present

## 2024-02-09 DIAGNOSIS — Z1152 Encounter for screening for COVID-19: Secondary | ICD-10-CM | POA: Diagnosis not present

## 2024-02-09 DIAGNOSIS — J9621 Acute and chronic respiratory failure with hypoxia: Secondary | ICD-10-CM | POA: Diagnosis present

## 2024-02-09 DIAGNOSIS — J4489 Other specified chronic obstructive pulmonary disease: Secondary | ICD-10-CM | POA: Diagnosis not present

## 2024-02-09 DIAGNOSIS — Z9981 Dependence on supplemental oxygen: Secondary | ICD-10-CM

## 2024-02-09 DIAGNOSIS — Z7985 Long-term (current) use of injectable non-insulin antidiabetic drugs: Secondary | ICD-10-CM

## 2024-02-09 DIAGNOSIS — M2550 Pain in unspecified joint: Secondary | ICD-10-CM

## 2024-02-09 DIAGNOSIS — D573 Sickle-cell trait: Secondary | ICD-10-CM | POA: Diagnosis present

## 2024-02-09 DIAGNOSIS — D869 Sarcoidosis, unspecified: Secondary | ICD-10-CM | POA: Diagnosis present

## 2024-02-09 DIAGNOSIS — I10 Essential (primary) hypertension: Secondary | ICD-10-CM | POA: Diagnosis present

## 2024-02-09 DIAGNOSIS — D860 Sarcoidosis of lung: Secondary | ICD-10-CM | POA: Diagnosis present

## 2024-02-09 DIAGNOSIS — Z79899 Other long term (current) drug therapy: Secondary | ICD-10-CM | POA: Diagnosis not present

## 2024-02-09 DIAGNOSIS — Z8249 Family history of ischemic heart disease and other diseases of the circulatory system: Secondary | ICD-10-CM

## 2024-02-09 DIAGNOSIS — R0609 Other forms of dyspnea: Secondary | ICD-10-CM | POA: Diagnosis not present

## 2024-02-09 DIAGNOSIS — Z8481 Family history of carrier of genetic disease: Secondary | ICD-10-CM | POA: Diagnosis not present

## 2024-02-09 DIAGNOSIS — R0602 Shortness of breath: Secondary | ICD-10-CM

## 2024-02-09 DIAGNOSIS — Z7982 Long term (current) use of aspirin: Secondary | ICD-10-CM

## 2024-02-09 DIAGNOSIS — Z7951 Long term (current) use of inhaled steroids: Secondary | ICD-10-CM

## 2024-02-09 DIAGNOSIS — Z87891 Personal history of nicotine dependence: Secondary | ICD-10-CM

## 2024-02-09 DIAGNOSIS — Z6841 Body Mass Index (BMI) 40.0 and over, adult: Secondary | ICD-10-CM | POA: Diagnosis not present

## 2024-02-09 DIAGNOSIS — E785 Hyperlipidemia, unspecified: Secondary | ICD-10-CM | POA: Diagnosis present

## 2024-02-09 DIAGNOSIS — J441 Chronic obstructive pulmonary disease with (acute) exacerbation: Secondary | ICD-10-CM | POA: Diagnosis present

## 2024-02-09 DIAGNOSIS — K219 Gastro-esophageal reflux disease without esophagitis: Secondary | ICD-10-CM | POA: Diagnosis present

## 2024-02-09 DIAGNOSIS — E66813 Obesity, class 3: Secondary | ICD-10-CM | POA: Diagnosis not present

## 2024-02-09 DIAGNOSIS — J849 Interstitial pulmonary disease, unspecified: Secondary | ICD-10-CM | POA: Diagnosis present

## 2024-02-09 DIAGNOSIS — D638 Anemia in other chronic diseases classified elsewhere: Secondary | ICD-10-CM | POA: Diagnosis present

## 2024-02-09 DIAGNOSIS — J302 Other seasonal allergic rhinitis: Secondary | ICD-10-CM

## 2024-02-09 LAB — PRO BRAIN NATRIURETIC PEPTIDE: Pro Brain Natriuretic Peptide: 137 pg/mL

## 2024-02-09 LAB — COMPREHENSIVE METABOLIC PANEL WITH GFR
ALT: 15 U/L (ref 0–44)
AST: 30 U/L (ref 15–41)
Albumin: 3.6 g/dL (ref 3.5–5.0)
Alkaline Phosphatase: 91 U/L (ref 38–126)
Anion gap: 7 (ref 5–15)
BUN: 13 mg/dL (ref 6–20)
CO2: 34 mmol/L — ABNORMAL HIGH (ref 22–32)
Calcium: 8.8 mg/dL — ABNORMAL LOW (ref 8.9–10.3)
Chloride: 102 mmol/L (ref 98–111)
Creatinine, Ser: 0.87 mg/dL (ref 0.44–1.00)
GFR, Estimated: >60 mL/min
Glucose, Bld: 128 mg/dL — ABNORMAL HIGH (ref 70–99)
Potassium: 3.8 mmol/L (ref 3.5–5.1)
Sodium: 143 mmol/L (ref 135–145)
Total Bilirubin: 0.4 mg/dL (ref 0.0–1.2)
Total Protein: 7.1 g/dL (ref 6.5–8.1)

## 2024-02-09 LAB — TROPONIN T, HIGH SENSITIVITY
Troponin T High Sensitivity: 17 ng/L (ref 0–19)
Troponin T High Sensitivity: 17 ng/L (ref 0–19)

## 2024-02-09 LAB — CBC WITH DIFFERENTIAL/PLATELET
Abs Immature Granulocytes: 0.03 K/uL (ref 0.00–0.07)
Basophils Absolute: 0 K/uL (ref 0.0–0.1)
Basophils Relative: 0 %
Eosinophils Absolute: 0.2 K/uL (ref 0.0–0.5)
Eosinophils Relative: 2 %
HCT: 32.8 % — ABNORMAL LOW (ref 36.0–46.0)
Hemoglobin: 9.9 g/dL — ABNORMAL LOW (ref 12.0–15.0)
Immature Granulocytes: 0 %
Lymphocytes Relative: 20 %
Lymphs Abs: 1.9 K/uL (ref 0.7–4.0)
MCH: 23.3 pg — ABNORMAL LOW (ref 26.0–34.0)
MCHC: 30.2 g/dL (ref 30.0–36.0)
MCV: 77.2 fL — ABNORMAL LOW (ref 80.0–100.0)
Monocytes Absolute: 0.8 K/uL (ref 0.1–1.0)
Monocytes Relative: 9 %
Neutro Abs: 6.5 K/uL (ref 1.7–7.7)
Neutrophils Relative %: 69 %
Platelets: 300 K/uL (ref 150–400)
RBC: 4.25 MIL/uL (ref 3.87–5.11)
RDW: 14.9 % (ref 11.5–15.5)
WBC: 9.3 K/uL (ref 4.0–10.5)
nRBC: 0 % (ref 0.0–0.2)

## 2024-02-09 LAB — I-STAT CHEM 8, ED
BUN: 13 mg/dL (ref 6–20)
Calcium, Ion: 1.16 mmol/L (ref 1.15–1.40)
Chloride: 97 mmol/L — ABNORMAL LOW (ref 98–111)
Creatinine, Ser: 1 mg/dL (ref 0.44–1.00)
Glucose, Bld: 132 mg/dL — ABNORMAL HIGH (ref 70–99)
HCT: 32 % — ABNORMAL LOW (ref 36.0–46.0)
Hemoglobin: 10.9 g/dL — ABNORMAL LOW (ref 12.0–15.0)
Potassium: 3.3 mmol/L — ABNORMAL LOW (ref 3.5–5.1)
Sodium: 145 mmol/L (ref 135–145)
TCO2: 37 mmol/L — ABNORMAL HIGH (ref 22–32)

## 2024-02-09 LAB — I-STAT VENOUS BLOOD GAS, ED
Acid-Base Excess: 11 mmol/L — ABNORMAL HIGH (ref 0.0–2.0)
Bicarbonate: 38.9 mmol/L — ABNORMAL HIGH (ref 20.0–28.0)
Calcium, Ion: 1.16 mmol/L (ref 1.15–1.40)
HCT: 32 % — ABNORMAL LOW (ref 36.0–46.0)
Hemoglobin: 10.9 g/dL — ABNORMAL LOW (ref 12.0–15.0)
O2 Saturation: 99 %
Potassium: 3.3 mmol/L — ABNORMAL LOW (ref 3.5–5.1)
Sodium: 144 mmol/L (ref 135–145)
TCO2: 41 mmol/L — ABNORMAL HIGH (ref 22–32)
pCO2, Ven: 69.3 mmHg — ABNORMAL HIGH (ref 44–60)
pH, Ven: 7.357 (ref 7.25–7.43)
pO2, Ven: 151 mmHg — ABNORMAL HIGH (ref 32–45)

## 2024-02-09 LAB — RESP PANEL BY RT-PCR (RSV, FLU A&B, COVID)  RVPGX2
Influenza A by PCR: NEGATIVE
Influenza B by PCR: NEGATIVE
Resp Syncytial Virus by PCR: NEGATIVE
SARS Coronavirus 2 by RT PCR: NEGATIVE

## 2024-02-09 LAB — D-DIMER, QUANTITATIVE: D-Dimer, Quant: 0.51 ug{FEU}/mL — ABNORMAL HIGH (ref 0.00–0.50)

## 2024-02-09 LAB — LIPASE, BLOOD: Lipase: 13 U/L (ref 11–51)

## 2024-02-09 MED ORDER — MAGNESIUM SULFATE 2 GM/50ML IV SOLN
2.0000 g | Freq: Once | INTRAVENOUS | Status: AC
  Administered 2024-02-09: 2 g via INTRAVENOUS
  Filled 2024-02-09: qty 50

## 2024-02-09 MED ORDER — SODIUM CHLORIDE 0.9 % IV SOLN
100.0000 mg | Freq: Once | INTRAVENOUS | Status: AC
  Administered 2024-02-09: 100 mg via INTRAVENOUS
  Filled 2024-02-09: qty 100

## 2024-02-09 MED ORDER — SODIUM CHLORIDE 0.9 % IV SOLN
2.0000 g | Freq: Once | INTRAVENOUS | Status: AC
  Administered 2024-02-09: 2 g via INTRAVENOUS
  Filled 2024-02-09: qty 20

## 2024-02-09 MED ORDER — IPRATROPIUM-ALBUTEROL 0.5-2.5 (3) MG/3ML IN SOLN
3.0000 mL | Freq: Four times a day (QID) | RESPIRATORY_TRACT | Status: AC
  Administered 2024-02-10 (×2): 3 mL via RESPIRATORY_TRACT
  Filled 2024-02-09 (×2): qty 3

## 2024-02-09 MED ORDER — ALBUTEROL SULFATE (2.5 MG/3ML) 0.083% IN NEBU
10.0000 mg/h | INHALATION_SOLUTION | Freq: Once | RESPIRATORY_TRACT | Status: AC
  Administered 2024-02-09: 10 mg/h via RESPIRATORY_TRACT
  Filled 2024-02-09: qty 12

## 2024-02-09 MED ORDER — SODIUM CHLORIDE 0.9 % IV SOLN: 500.0000 mg | Freq: Once | INTRAVENOUS | Status: DC

## 2024-02-09 MED ORDER — METHYLPREDNISOLONE SODIUM SUCC 40 MG IJ SOLR
40.0000 mg | Freq: Every day | INTRAMUSCULAR | Status: DC
  Administered 2024-02-09: 40 mg via INTRAVENOUS
  Filled 2024-02-09: qty 1

## 2024-02-09 MED ORDER — NAPHAZOLINE-GLYCERIN 0.012-0.25 % OP SOLN
1.0000 [drp] | Freq: Four times a day (QID) | OPHTHALMIC | Status: DC | PRN
  Administered 2024-02-12: 2 [drp] via OPHTHALMIC
  Filled 2024-02-09: qty 15

## 2024-02-09 MED ORDER — IPRATROPIUM-ALBUTEROL 0.5-2.5 (3) MG/3ML IN SOLN
3.0000 mL | RESPIRATORY_TRACT | Status: AC
  Administered 2024-02-09 (×3): 3 mL via RESPIRATORY_TRACT
  Filled 2024-02-09: qty 3
  Filled 2024-02-09: qty 6

## 2024-02-09 NOTE — ED Provider Notes (Signed)
 " [facility] Provider Note   HPI/ROS    History obtained from patient.  Teresa Mendez is a 49 y.o. female who presents for Chest Pain and Shortness of Breath and who  has a past medical history of Asthma, Hypertension, Sarcoidosis, Sickle cell trait, and Stress incontinence.  Patient presents today for roughly 2 weeks of worsening dyspnea at home.  States she was seen here and prescribed antibiotics.  Has not really felt much better and even followed up with her pulmonologist who gave her an additional course of antibiotics.  Despite this, she still having shortness of breath.  She also is having chest pain when laying down, exerting herself, or taking deep breaths.  She says when she lays down she just feels like she is having more difficulty breathing rather than true chest pain.  Endorses chest tightness that does not radiate.  No associated nausea, vomiting, or diaphoresis.  No recent fevers, chills, headaches, or visual changes.  Denies any history of blood clots.  States that her sputum production at home has been clear, and has not changed in consistency or quantity.  Endorsing some epigastric pain intermittently too.  MDM   I have reviewed the nursing documentation, vital signs, as well as the past medical history, surgical history, family history, and social history.  Initial Assessment:  Patient hemodynamically stable on initial evaluation.  Has almost no breath sounds bilaterally.  Not moving almost any air.  Still without significant hypoxia on her home oxygen .  Moderate risk for PE, so will obtain D-dimer.  Will also obtain CBC, chemistry, lipase for epigastric pain, troponin, BNP, and VBG.  Also obtain chest x-ray.  Pending dimer may need further imaging of the chest.  Also give Solu-Medrol  and DuoNebs.  EKG here normal sinus rhythm with no ischemia, dysrhythmia, or high-grade AV block.  No pathologic T wave versions.  Will use years criteria  for age adjustment given D-dimer not most likely diagnosis.  She has no breath sounds at all reassuring more consistent with COPD.  D-dimer 0.51.  Per years criteria, very low risk for PE with a 0.43% chance of symptomatic VTE within a 85-month follow-up.  Will hold off on CT PE given this.  VBG here without significant acidosis.  Does have hypercarbia, but with chronic compensation.  CBC here with no significant leukocytosis or thrombocytopenia, chronic stable anemia likely secondary to anemia of chronic disease.  Metabolic panel with no significant abnormalities aside from elevated CO2 likely secondary retention.  Lipase within normal limits.  Troponin negative.  BNP also negative as well as COVID and flu.  Patient placed on continuous albuterol  given minimal improvement after DuoNebs.  Patient starting to open up after continuous albuterol .  Given numerous medications needed for COPD exacerbation feel patient needs admission.  May benefit from further pulmonary hygiene while inpatient.  Disposition:  I discussed the case with Dr.Garba who graciously agreed to admit the patient to their service for continued care.     This patient was staffed with Dr. Jakie who supervised the visit and agreed with the plan of care.   Due to the patients current presenting symptoms, physical exam findings, and the workup stated above, it is thought that the etiology of the patients current presentation is:  1. COPD exacerbation (HCC)   2. Interstitial pulmonary disease (HCC)   3. Primary hypertension   4. Dyslipidemia, goal LDL below 100   5. Arthralgia of multiple joints  6. Gastroesophageal reflux disease without esophagitis   7. Seasonal allergies   8. Shortness of breath     Clinical Complexity A medically appropriate history, review of systems, and physical exam was performed.  Factors that affect the complexity of this encounter: assessment of correct protocol, laboratory work from this  visit, and review of echocardiogram/EKG results  My independent interpretations of diagnostic studies are documented in the ED course above.   If decision rules were used in this patient's evaluation, they are listed below.   Click here for ABCD2, HEART and other calculators  Patient's presentation is most consistent with acute presentation with potential threat to life or bodily function.  MDM generated using voice dictation software and may contain dictation errors. Please contact me for any clarification or with any questions.    Physical Exam, PMH, PSH, Family History, and Social Hsitory   Vitals:   02/09/24 1802 02/09/24 2000 02/09/24 2129 02/09/24 2200  BP:  (!) 174/102  (!) 146/96  Pulse:  90  94  Resp:  19  (!) 28  Temp:      SpO2:  95% 98% 100%  Weight: 126.9 kg     Height: 5' 4 (1.626 m)       Physical Exam Constitutional:      Appearance: She is well-developed.  HENT:     Head: Normocephalic and atraumatic.  Cardiovascular:     Rate and Rhythm: Normal rate and regular rhythm.     Pulses:          Radial pulses are 2+ on the right side and 2+ on the left side.  Pulmonary:     Effort: Tachypnea present.     Breath sounds: Examination of the right-upper field reveals decreased breath sounds. Examination of the left-upper field reveals decreased breath sounds. Examination of the right-middle field reveals decreased breath sounds. Examination of the left-middle field reveals decreased breath sounds. Examination of the right-lower field reveals decreased breath sounds. Examination of the left-lower field reveals decreased breath sounds. Decreased breath sounds present. No wheezing.  Abdominal:     Palpations: Abdomen is soft.     Tenderness: There is no abdominal tenderness. There is no guarding or rebound.  Musculoskeletal:     Right lower leg: Edema present.     Left lower leg: Edema present.  Skin:    General: Skin is warm and dry.     Capillary Refill: Capillary  refill takes less than 2 seconds.  Neurological:     General: No focal deficit present.     Mental Status: She is alert and oriented to person, place, and time.     Past Medical History:  Diagnosis Date   Asthma    Hypertension    Sarcoidosis    Sickle cell trait    Stress incontinence      Past Surgical History:  Procedure Laterality Date   BREAST BIOPSY Right 2021   BREAST SURGERY Right    biopsy   TUBAL LIGATION       Family History  Problem Relation Age of Onset   Hypertension Mother    Congestive Heart Failure Mother    Kidney failure Mother    Sickle cell trait Mother    Hypertension Father    Congestive Heart Failure Father    Kidney failure Father    Glaucoma Father    Sickle cell trait Daughter    Sickle cell trait Daughter    Breast cancer Paternal Grandmother    Hypertension  Brother    Liver disease Brother    Sickle cell trait Son     Social History   Tobacco Use   Smoking status: Former    Current packs/day: 0.00    Average packs/day: 0.5 packs/day    Types: Cigarettes    Quit date: 01/02/2019    Years since quitting: 5.1    Passive exposure: Current   Smokeless tobacco: Never  Substance Use Topics   Alcohol use: Not Currently    Comment: Occasionally      Procedures   If procedures were preformed on this patient, they are listed below:  Procedures   Electronically signed by:   Glendia Carlin Ancona, M.D. PGY-2, Emergency Medicine   Please note that this documentation was produced with the assistance of voice-to-text technology and may contain errors.    Ancona Glendia, MD 02/09/24 2238  "

## 2024-02-09 NOTE — H&P (Signed)
 " History and Physical    Patient: Teresa Mendez FMW:[id number] DOB: 1975-06-15 DOA: 02/09/2024 DOS: the patient was seen and examined on 02/09/2024 PCP: Theotis Haze ORN, NP  Patient coming from: Home  Chief Complaint:  Chief Complaint  Patient presents with   Chest Pain   Shortness of Breath   HPI: Teresa Mendez is a 49 y.o. female with medical history significant of COPD asthma syndrome, essential hypertension, sarcoidosis, stress incontinence, GERD, interstitial lung disease who is on 3 L of oxygen  at home and quit smoking about 5 years ago presenting to the ER with shortness of breath cough and wheezing.  Patient was seen and evaluated.  She was having significant shortness of breath not moving air at all.  Patient treated with multiple rounds of nebulizer treatment but continues to have poor air movement.  She is also requiring more oxygen  than her baseline 3 L.  She is good to be admitted to the hospital with acute on chronic hypoxic respiratory failure secondary to COPD exacerbation  Review of Systems: As mentioned in the history of present illness. All other systems reviewed and are negative. Past Medical History:  Diagnosis Date   Asthma    Hypertension    Sarcoidosis    Sickle cell trait    Stress incontinence    Past Surgical History:  Procedure Laterality Date   BREAST BIOPSY Right 2021   BREAST SURGERY Right    biopsy   TUBAL LIGATION     Social History:  reports that she quit smoking about 5 years ago. Her smoking use included cigarettes. She smoked an average of 0.5 packs per day. She has been exposed to tobacco smoke. She has never used smokeless tobacco. She reports that she does not currently use alcohol. She reports that she does not use drugs.  Allergies[1]  Family History  Problem Relation Age of Onset   Hypertension Mother    Congestive Heart Failure Mother    Kidney failure Mother    Sickle cell trait Mother    Hypertension Father    Congestive Heart  Failure Father    Kidney failure Father    Glaucoma Father    Sickle cell trait Daughter    Sickle cell trait Daughter    Breast cancer Paternal Grandmother    Hypertension Brother    Liver disease Brother    Sickle cell trait Son     Prior to Admission medications  Medication Sig Start Date End Date Taking? Authorizing Provider  albuterol  (PROVENTIL ) (2.5 MG/3ML) 0.083% nebulizer solution Take 3 mLs by nebulization every 6 (six) hours as needed for wheezing or shortness of breath. 10/23/23   Fleming, Zelda W, NP  albuterol  (VENTOLIN  HFA) 108 (90 Base) MCG/ACT inhaler Inhale 1-2 puffs into the lungs every 6 (six) hours as needed for wheezing or shortness of breath. 10/23/23   Fleming, Zelda W, NP  amLODipine  (NORVASC ) 10 MG tablet Take 1 tablet (10 mg total) by mouth daily. 10/23/23   Fleming, Zelda W, NP  aspirin  EC 81 MG tablet Take 81 mg by mouth daily. Patient not taking: Reported on 02/04/2024    [provider]  atorvastatin  (LIPITOR) 80 MG tablet Take 1 tablet (80 mg total) by mouth daily. 10/23/23   Theotis Haze ORN, NP  Blood Pressure Monitor DEVI Please provide patient with insurance approved blood pressure monitor I10.0 12/21/22   Theotis Haze ORN, NP  Budeson-Glycopyrrol-Formoterol  (BREZTRI  AEROSPHERE) 160-9-4.8 MCG/ACT AERO Inhale 2 puffs into the lungs in the  morning and at bedtime. 02/19/23   Fleming, Zelda W, NP  carvedilol  (COREG ) 6.25 MG tablet Take 1 tablet (6.25 mg total) by mouth 2 (two) times daily with a meal. 10/23/23   Theotis Haze ORN, NP  cetirizine  (ZYRTEC ) 10 MG tablet Take 1 tablet (10 mg total) by mouth daily. 10/23/23   Fleming, Zelda W, NP  diclofenac  Sodium (VOLTAREN ) 1 % GEL Apply 2 g topically 4 (four) times daily. Patient not taking: Reported on 02/04/2024 10/23/23   Fleming, Zelda W, NP  doxycycline  (VIBRAMYCIN ) 100 MG capsule Take 1 capsule (100 mg total) by mouth 2 (two) times daily. 12/30/23   Dean Clarity, MD  DULoxetine  (CYMBALTA ) 60 MG capsule Take  1 capsule (60 mg total) by mouth daily. 10/23/23   Fleming, Zelda W, NP  famotidine  (PEPCID ) 20 MG tablet Take 1 tablet (20 mg total) by mouth at bedtime. 10/23/23   Fleming, Zelda W, NP  fluticasone  (FLONASE ) 50 MCG/ACT nasal spray Place 2 sprays into both nostrils daily. 10/23/23   Fleming, Zelda W, NP  hydrochlorothiazide  (HYDRODIURIL ) 25 MG tablet Take 1 tablet (25 mg total) by mouth daily. 10/23/23   Fleming, Zelda W, NP  lisinopril  (ZESTRIL ) 40 MG tablet Take 1 tablet (40 mg total) by mouth daily. 10/23/23   Theotis Haze ORN, NP  Masks MISC Nebulizer face mask G54.090 08/29/22   Theotis Haze ORN, NP  Misc. Devices (PULSE OXIMETER FOR FINGER) MISC Use as instructed J44.89 02/18/23   Fleming, Zelda W, NP  montelukast  (SINGULAIR ) 10 MG tablet Take 1 tablet (10 mg total) by mouth at bedtime. 10/23/23   Theotis Haze ORN, NP  olopatadine  ([employer] OLOPATADINE  HCL) 0.1 % ophthalmic solution Place 1 drop into both eyes 2 (two) times daily. 07/06/21   Rising, Asberry, PA-C  omeprazole  (PRILOSEC) 20 MG capsule Take 1 capsule (20 mg total) by mouth daily. 10/23/23   Theotis Haze ORN, NP  OXYGEN  Inhale into the lungs. 3 liters    [provider]  potassium chloride  SA (KLOR-CON  M) 20 MEQ tablet Take 1 tablet (20 mEq total) by mouth daily. 08/29/22   Fleming, Zelda W, NP  Semaglutide -Weight Management (WEGOVY ) 0.25 MG/0.5ML SOAJ Inject 0.5 mg into the skin once a week. Patient not taking: Reported on 02/04/2024 02/18/23   Fleming, Zelda W, NP  triamcinolone  ointment (KENALOG ) 0.1 % Apply 1 Application topically 2 (two) times daily. 10/23/23   Theotis Haze ORN, NP  Vitamin D , Ergocalciferol , (DRISDOL ) 1.25 MG (50000 UNIT) CAPS capsule Take 1 capsule (50,000 Units total) by mouth every 7 (seven) days. 10/30/23   Theotis Haze ORN, NP    Physical Exam: Vitals:   02/09/24 1802 02/09/24 2000 02/09/24 2129 02/09/24 2200  BP:  (!) 174/102  (!) 146/96  Pulse:  90  94  Resp:  19  (!) 28  Temp:      SpO2:  95% 98% 100%   Weight: 126.9 kg     Height: 5' 4 (1.626 m)      Constitutional: Obese, acutely ill looking, in mild respiratory distress  Eyes: PERRL, lids and conjunctivae normal ENMT: Mucous membranes are moist. Posterior pharynx clear of any exudate or lesions.Normal dentition.  Neck: normal, supple, no masses, no thyromegaly Respiratory: Decreased air entry bilaterally with marked expiratory wheezing. No accessory muscle use.  Cardiovascular: Regular rate and rhythm, no murmurs / rubs / gallops. No extremity edema. 2+ pedal pulses. No carotid bruits.  Abdomen: no tenderness, no masses palpated. No hepatosplenomegaly. Bowel sounds positive.  Musculoskeletal:  Good range of motion, no joint swelling or tenderness, Skin: no rashes, lesions, ulcers. No induration Neurologic: CN 2-12 grossly intact. Sensation intact, DTR normal. Strength 5/5 in all 4.  Psychiatric: Normal judgment and insight. Alert and oriented x 3.  Anxious mood  Data Reviewed:  Temperature 98.8, blood pressure 175/103, pulse 97, oxygen  sat 95% on 4 L white count is 9.3 hemoglobin 10.9, calcium  8.8 potassium 3.3, D-dimer 0.51.  Acute viral screen is negative for flu COVID-19 and RSV.  Chest x-ray showed mild pulmonary vascular congestion with mild mid right lung linear scarring  Assessment and Plan:  #1 acute on chronic hypoxic respiratory failure: Secondary to COPD exacerbation.  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  Will admit the patient.  Aggressive nebulizer treatment, steroid, antibiotics.  Continue oxygenation.  #2 COPD exacerbation: As per above  #3 uncontrolled hypertension: Resume home regimen and adjust dose  #4 GERD: Will continue with PPI  #5 morbid obesity: Dietary counseling  #6 interstitial lung disease: Patient follows up with pulmonologist Dr. Annella.  Will resume care after discharge    Advance Care Planning:   Code Status: Full Code   Consults: None  Family  Communication: No family at bedside  Severity of Illness: The appropriate patient status for this patient is INPATIENT. Inpatient status is judged to be reasonable and necessary in order to provide the required intensity of service to ensure the patient's safety. The patient's presenting symptoms, physical exam findings, and initial radiographic and laboratory data in the context of their chronic comorbidities is felt to place them at high risk for further clinical deterioration. Furthermore, it is not anticipated that the patient will be medically stable for discharge from the hospital within 2 midnights of admission.   * I certify that at the point of admission it is my clinical judgment that the patient will require inpatient hospital care spanning beyond 2 midnights from the point of admission due to high intensity of service, high risk for further deterioration and high frequency of surveillance required.*  AuthorBETHA SIM KNOLL, MD 02/09/2024 10:17 PM  For on call review [email or web address].      [1] No Known Allergies  "

## 2024-02-09 NOTE — ED Provider Notes (Incomplete)
 " [facility] Provider Note   CSN: [id number] Arrival date & time: 02/09/24  1742     Patient presents with: Chest Pain and Shortness of Breath   Teresa Mendez is a 49 y.o. female.  {Add pertinent medical, surgical, social history, OB history to HPI:32947}   The pt is c/o chest pain and sob for 2 weeks yesterday her chest pain  started  She is on nasal 02 at home 3 liters at present  Past Medical History: No date: Asthma No date: Hypertension No date: Sarcoidosis No date: Sickle cell trait No date: Stress incontinence        Prior to Admission medications  Medication Sig Start Date End Date Taking? Authorizing Provider  albuterol  (PROVENTIL ) (2.5 MG/3ML) 0.083% nebulizer solution Take 3 mLs by nebulization every 6 (six) hours as needed for wheezing or shortness of breath. 10/23/23   Fleming, Zelda W, NP  albuterol  (VENTOLIN  HFA) 108 (90 Base) MCG/ACT inhaler Inhale 1-2 puffs into the lungs every 6 (six) hours as needed for wheezing or shortness of breath. 10/23/23   Fleming, Zelda W, NP  amLODipine  (NORVASC ) 10 MG tablet Take 1 tablet (10 mg total) by mouth daily. 10/23/23   Theotis Haze ORN, NP  aspirin  EC 81 MG tablet Take 81 mg by mouth daily. Patient not taking: Reported on 02/04/2024    [provider]  atorvastatin  (LIPITOR) 80 MG tablet Take 1 tablet (80 mg total) by mouth daily. 10/23/23   Theotis Haze ORN, NP  Blood Pressure Monitor DEVI Please provide patient with insurance approved blood pressure monitor I10.0 12/21/22   Theotis Haze ORN, NP  Budeson-Glycopyrrol-Formoterol  (BREZTRI  AEROSPHERE) 160-9-4.8 MCG/ACT AERO Inhale 2 puffs into the lungs in the morning and at bedtime. 02/19/23   Fleming, Zelda W, NP  carvedilol  (COREG ) 6.25 MG tablet Take 1 tablet (6.25 mg total) by mouth 2 (two) times daily with a meal. 10/23/23   Theotis Haze ORN, NP  cetirizine  (ZYRTEC ) 10 MG tablet Take 1 tablet (10 mg total) by mouth  daily. 10/23/23   Fleming, Zelda W, NP  diclofenac  Sodium (VOLTAREN ) 1 % GEL Apply 2 g topically 4 (four) times daily. Patient not taking: Reported on 02/04/2024 10/23/23   Fleming, Zelda W, NP  doxycycline  (VIBRAMYCIN ) 100 MG capsule Take 1 capsule (100 mg total) by mouth 2 (two) times daily. 12/30/23   Dean Clarity, MD  DULoxetine  (CYMBALTA ) 60 MG capsule Take 1 capsule (60 mg total) by mouth daily. 10/23/23   Fleming, Zelda W, NP  famotidine  (PEPCID ) 20 MG tablet Take 1 tablet (20 mg total) by mouth at bedtime. 10/23/23   Fleming, Zelda W, NP  fluticasone  (FLONASE ) 50 MCG/ACT nasal spray Place 2 sprays into both nostrils daily. 10/23/23   Fleming, Zelda W, NP  hydrochlorothiazide  (HYDRODIURIL ) 25 MG tablet Take 1 tablet (25 mg total) by mouth daily. 10/23/23   Fleming, Zelda W, NP  lisinopril  (ZESTRIL ) 40 MG tablet Take 1 tablet (40 mg total) by mouth daily. 10/23/23   Theotis Haze ORN, NP  Masks MISC Nebulizer face mask G54.090 08/29/22   Theotis Haze ORN, NP  Misc. Devices (PULSE OXIMETER FOR FINGER) MISC Use as instructed J44.89 02/18/23   Fleming, Zelda W, NP  montelukast  (SINGULAIR ) 10 MG tablet Take 1 tablet (10 mg total) by mouth at bedtime. 10/23/23   Theotis Haze ORN, NP  olopatadine  ([employer]S OLOPATADINE  HCL) 0.1 % ophthalmic solution Place 1 drop into both eyes 2 (two) times daily.  07/06/21   Rising, Asberry, PA-C  omeprazole  (PRILOSEC) 20 MG capsule Take 1 capsule (20 mg total) by mouth daily. 10/23/23   Theotis Haze ORN, NP  OXYGEN  Inhale into the lungs. 3 liters    [provider]  potassium chloride  SA (KLOR-CON  M) 20 MEQ tablet Take 1 tablet (20 mEq total) by mouth daily. 08/29/22   Fleming, Zelda W, NP  Semaglutide -Weight Management (WEGOVY ) 0.25 MG/0.5ML SOAJ Inject 0.5 mg into the skin once a week. Patient not taking: Reported on 02/04/2024 02/18/23   Fleming, Zelda W, NP  triamcinolone  ointment (KENALOG ) 0.1 % Apply 1 Application topically 2 (two) times daily. 10/23/23   Theotis Haze ORN,  NP  Vitamin D , Ergocalciferol , (DRISDOL ) 1.25 MG (50000 UNIT) CAPS capsule Take 1 capsule (50,000 Units total) by mouth every 7 (seven) days. 10/30/23   Fleming, Zelda W, NP    Allergies: Patient has no known allergies.    Review of Systems  Updated Vital Signs BP (!) 175/102 (BP Location: Left Arm)   Pulse 97   Temp 98.8 F (37.1 C)   Resp 20   Ht 5' 4 (1.626 m)   Wt 126.9 kg   LMP 02/02/2024   SpO2 94%   BMI 48.02 kg/m   Physical Exam  (all labs ordered are listed, but only abnormal results are displayed) Labs Reviewed - No data to display  EKG: EKG Interpretation Date/Time:  Sunday February 09 2024 17:52:16 EST Ventricular Rate:  81 PR Interval:  136 QRS Duration:  76 QT Interval:  390 QTC Calculation: 453 R Axis:   67  Text Interpretation: Normal sinus rhythm Minimal voltage criteria for LVH, may be normal variant ( Sokolow-Lyon ) Borderline ECG Confirmed by Yolande Charleston [phone]) on 02/09/2024 5:59:57 PM  Radiology: No results found.  {Document cardiac monitor, telemetry assessment procedure when appropriate:32947} Procedures   Medications Ordered in the ED - No data to display    {Click here for ABCD2, HEART and other calculators REFRESH Note before signing:1}                              Medical Decision Making  ***  {Document critical care time when appropriate  Document review of labs and clinical decision tools ie CHADS2VASC2, etc  Document your independent review of radiology images and any outside records  Document your discussion with family members, caretakers and with consultants  Document social determinants of health affecting pt's care  Document your decision making why or why not admission, treatments were needed:32947:::1}   Final diagnoses:  None    ED Discharge Orders     None        "

## 2024-02-09 NOTE — ED Triage Notes (Signed)
 She is on nasal 02 at home 3 liters at present

## 2024-02-09 NOTE — ED Triage Notes (Signed)
 The pt is c/o chest pain and sob for 2 weeks yesterday her chest pain started

## 2024-02-10 ENCOUNTER — Other Ambulatory Visit: Payer: Self-pay | Admitting: Pharmacist

## 2024-02-10 ENCOUNTER — Telehealth: Payer: Self-pay | Admitting: Nurse Practitioner

## 2024-02-10 ENCOUNTER — Inpatient Hospital Stay (HOSPITAL_COMMUNITY)

## 2024-02-10 ENCOUNTER — Other Ambulatory Visit: Payer: Self-pay | Admitting: Nurse Practitioner

## 2024-02-10 DIAGNOSIS — I10 Essential (primary) hypertension: Secondary | ICD-10-CM | POA: Diagnosis not present

## 2024-02-10 DIAGNOSIS — Z6841 Body Mass Index (BMI) 40.0 and over, adult: Secondary | ICD-10-CM

## 2024-02-10 DIAGNOSIS — K219 Gastro-esophageal reflux disease without esophagitis: Secondary | ICD-10-CM

## 2024-02-10 DIAGNOSIS — R0609 Other forms of dyspnea: Secondary | ICD-10-CM | POA: Diagnosis not present

## 2024-02-10 DIAGNOSIS — D869 Sarcoidosis, unspecified: Secondary | ICD-10-CM | POA: Diagnosis not present

## 2024-02-10 DIAGNOSIS — J849 Interstitial pulmonary disease, unspecified: Secondary | ICD-10-CM | POA: Diagnosis not present

## 2024-02-10 DIAGNOSIS — E66813 Obesity, class 3: Secondary | ICD-10-CM

## 2024-02-10 DIAGNOSIS — J9621 Acute and chronic respiratory failure with hypoxia: Secondary | ICD-10-CM | POA: Diagnosis not present

## 2024-02-10 DIAGNOSIS — J4489 Other specified chronic obstructive pulmonary disease: Secondary | ICD-10-CM | POA: Diagnosis not present

## 2024-02-10 LAB — RESPIRATORY PANEL BY PCR

## 2024-02-10 LAB — CBC
HCT: 32 % — ABNORMAL LOW (ref 36.0–46.0)
Hemoglobin: 9.4 g/dL — ABNORMAL LOW (ref 12.0–15.0)
MCH: 22.8 pg — ABNORMAL LOW (ref 26.0–34.0)
MCHC: 29.4 g/dL — ABNORMAL LOW (ref 30.0–36.0)
MCV: 77.5 fL — ABNORMAL LOW (ref 80.0–100.0)
Platelets: 273 K/uL (ref 150–400)
RBC: 4.13 MIL/uL (ref 3.87–5.11)
RDW: 14.8 % (ref 11.5–15.5)
WBC: 9.1 K/uL (ref 4.0–10.5)
nRBC: 0 % (ref 0.0–0.2)

## 2024-02-10 LAB — HIV ANTIBODY (ROUTINE TESTING W REFLEX): HIV Screen 4th Generation wRfx: NONREACTIVE

## 2024-02-10 LAB — ECHOCARDIOGRAM COMPLETE
Area-P 1/2: 4.8 cm2
Calc EF: 63.4 %
Height: 64 in
S' Lateral: 3.1 cm
Single Plane A2C EF: 66.7 %
Single Plane A4C EF: 57.6 %
Weight: 4476.22 [oz_av]

## 2024-02-10 LAB — COMPREHENSIVE METABOLIC PANEL WITH GFR
ALT: 16 U/L (ref 0–44)
AST: 20 U/L (ref 15–41)
Albumin: 4 g/dL (ref 3.5–5.0)
Alkaline Phosphatase: 103 U/L (ref 38–126)
Anion gap: 12 (ref 5–15)
BUN: 12 mg/dL (ref 6–20)
CO2: 30 mmol/L (ref 22–32)
Calcium: 8.9 mg/dL (ref 8.9–10.3)
Chloride: 101 mmol/L (ref 98–111)
Creatinine, Ser: 0.83 mg/dL (ref 0.44–1.00)
GFR, Estimated: >60 mL/min
Glucose, Bld: 214 mg/dL — ABNORMAL HIGH (ref 70–99)
Potassium: 3.2 mmol/L — ABNORMAL LOW (ref 3.5–5.1)
Sodium: 143 mmol/L (ref 135–145)
Total Bilirubin: 0.3 mg/dL (ref 0.0–1.2)
Total Protein: 7.8 g/dL (ref 6.5–8.1)

## 2024-02-10 MED ORDER — LISINOPRIL 20 MG PO TABS
40.0000 mg | ORAL_TABLET | Freq: Every day | ORAL | Status: DC
  Administered 2024-02-10 – 2024-02-13 (×4): 40 mg via ORAL
  Filled 2024-02-10 (×4): qty 2

## 2024-02-10 MED ORDER — CARVEDILOL 6.25 MG PO TABS
6.2500 mg | ORAL_TABLET | Freq: Two times a day (BID) | ORAL | Status: DC
  Administered 2024-02-10 – 2024-02-11 (×3): 6.25 mg via ORAL
  Filled 2024-02-10: qty 2
  Filled 2024-02-10 (×2): qty 1

## 2024-02-10 MED ORDER — ALBUTEROL SULFATE HFA 108 (90 BASE) MCG/ACT IN AERS: 1.0000 | INHALATION_SPRAY | Freq: Four times a day (QID) | RESPIRATORY_TRACT | Status: DC | PRN

## 2024-02-10 MED ORDER — PANTOPRAZOLE SODIUM 40 MG PO TBEC
40.0000 mg | DELAYED_RELEASE_TABLET | Freq: Every day | ORAL | Status: DC
  Administered 2024-02-10 – 2024-02-13 (×4): 40 mg via ORAL
  Filled 2024-02-10 (×4): qty 1

## 2024-02-10 MED ORDER — FAMOTIDINE 20 MG PO TABS
20.0000 mg | ORAL_TABLET | Freq: Every day | ORAL | Status: DC
  Administered 2024-02-10 – 2024-02-12 (×4): 20 mg via ORAL
  Filled 2024-02-10 (×4): qty 1

## 2024-02-10 MED ORDER — POTASSIUM CHLORIDE 20 MEQ PO PACK
40.0000 meq | PACK | Freq: Every day | ORAL | Status: DC
  Administered 2024-02-10 – 2024-02-13 (×4): 40 meq via ORAL
  Filled 2024-02-10 (×4): qty 2

## 2024-02-10 MED ORDER — AZITHROMYCIN 500 MG PO TABS
500.0000 mg | ORAL_TABLET | Freq: Every day | ORAL | Status: AC
  Administered 2024-02-10 – 2024-02-12 (×3): 500 mg via ORAL
  Filled 2024-02-10 (×3): qty 1

## 2024-02-10 MED ORDER — ENOXAPARIN SODIUM 60 MG/0.6ML IJ SOSY
60.0000 mg | PREFILLED_SYRINGE | INTRAMUSCULAR | Status: DC
  Administered 2024-02-10 – 2024-02-13 (×4): 60 mg via SUBCUTANEOUS
  Filled 2024-02-10 (×4): qty 0.6

## 2024-02-10 MED ORDER — ATORVASTATIN CALCIUM 80 MG PO TABS
80.0000 mg | ORAL_TABLET | Freq: Every day | ORAL | Status: DC
  Administered 2024-02-10 – 2024-02-13 (×4): 80 mg via ORAL
  Filled 2024-02-10 (×3): qty 1
  Filled 2024-02-10: qty 2

## 2024-02-10 MED ORDER — LACTATED RINGERS IV SOLN: INTRAVENOUS | Status: AC

## 2024-02-10 MED ORDER — METHYLPREDNISOLONE SODIUM SUCC 125 MG IJ SOLR
125.0000 mg | Freq: Two times a day (BID) | INTRAMUSCULAR | Status: AC
  Administered 2024-02-10 (×2): 125 mg via INTRAVENOUS
  Filled 2024-02-10 (×2): qty 2

## 2024-02-10 MED ORDER — ALBUTEROL SULFATE (2.5 MG/3ML) 0.083% IN NEBU
2.5000 mg | INHALATION_SOLUTION | Freq: Four times a day (QID) | RESPIRATORY_TRACT | Status: DC | PRN
  Administered 2024-02-11: 2.5 mg via RESPIRATORY_TRACT
  Filled 2024-02-10: qty 3

## 2024-02-10 MED ORDER — SODIUM CHLORIDE 0.9 % IV SOLN
1.0000 g | INTRAVENOUS | Status: DC
  Administered 2024-02-10 – 2024-02-11 (×2): 1 g via INTRAVENOUS
  Filled 2024-02-10 (×3): qty 10

## 2024-02-10 MED ORDER — PREDNISONE 20 MG PO TABS
40.0000 mg | ORAL_TABLET | Freq: Every day | ORAL | Status: DC
  Administered 2024-02-11: 40 mg via ORAL
  Filled 2024-02-10: qty 2

## 2024-02-10 MED ORDER — DULOXETINE HCL 60 MG PO CPEP
60.0000 mg | ORAL_CAPSULE | Freq: Every day | ORAL | Status: DC
  Administered 2024-02-10 – 2024-02-13 (×4): 60 mg via ORAL
  Filled 2024-02-10: qty 2
  Filled 2024-02-10 (×3): qty 1

## 2024-02-10 MED ORDER — LORATADINE 10 MG PO TABS
10.0000 mg | ORAL_TABLET | Freq: Every day | ORAL | Status: DC
  Administered 2024-02-10 – 2024-02-13 (×4): 10 mg via ORAL
  Filled 2024-02-10 (×4): qty 1

## 2024-02-10 MED ORDER — AMLODIPINE BESYLATE 10 MG PO TABS
10.0000 mg | ORAL_TABLET | Freq: Every day | ORAL | Status: DC
  Administered 2024-02-10 – 2024-02-13 (×4): 10 mg via ORAL
  Filled 2024-02-10: qty 1
  Filled 2024-02-10: qty 2
  Filled 2024-02-10 (×2): qty 1

## 2024-02-10 MED ORDER — WEGOVY 0.5 MG/0.5ML ~~LOC~~ SOAJ: 0.5000 mg | SUBCUTANEOUS | 1 refills | Status: AC

## 2024-02-10 MED ORDER — ACETAMINOPHEN 325 MG PO TABS
650.0000 mg | ORAL_TABLET | Freq: Four times a day (QID) | ORAL | Status: DC | PRN
  Administered 2024-02-10 – 2024-02-12 (×2): 650 mg via ORAL
  Filled 2024-02-10 (×2): qty 2

## 2024-02-10 MED ORDER — FLUTICASONE PROPIONATE 50 MCG/ACT NA SUSP
2.0000 | Freq: Every day | NASAL | Status: DC
  Administered 2024-02-11 – 2024-02-13 (×3): 2 via NASAL
  Filled 2024-02-10 (×2): qty 16

## 2024-02-10 NOTE — Telephone Encounter (Signed)
 Copied from CRM #[id number]. Topic: Clinical - Prescription Issue >> Feb 10, 2024  2:32 PM Amy B wrote: Reason for CRM: [facility] pharmacy states they need clarification on the prescription for Wegovy .  They state if you want them to dispense 0.5 mg then they would need an Rx for the pen. Otherwise, they would have to have the patient take two injections for prescription as written

## 2024-02-10 NOTE — Progress Notes (Signed)
" °  Echocardiogram 2D Echocardiogram has been performed.  Koleen KANDICE Popper, RDCS 02/10/2024, 5:19 PM "

## 2024-02-10 NOTE — Progress Notes (Signed)
 " Progress Note   Patient: Teresa Mendez FMW:[id number] DOB: [birth year] DOA: 02/09/2024     1 DOS: the patient was seen and examined on 02/10/2024   Brief hospital course: Teresa Mendez is a 49 y.o. female with medical history significant of COPD asthma syndrome, essential hypertension, sarcoidosis, stress incontinence, GERD, interstitial lung disease who is on 3 L of oxygen  at home and quit smoking about 5 years ago presenting to the ER with shortness of breath cough and wheezing.  Patient is admitted to hospitalist service for further management evaluation of acute on chronic hypoxic respiratory failure secondary to COPD exacerbation.  Assessment and Plan: Acute on chronic hypoxic respiratory failure. COPD exacerbation- She does have chronic sarcoidosis, interstitial lung disease. Patient will be continued on aggressive nebulizer treatment. Continue oral steroids. Rocephin  will be continued. Will add azithro for 3 days. Continue supplemental oxygen  to maintain saturation greater than 92%. Check Echo, she does have leg swelling, chest xray finding of pulmonary congestion. Patient will need to follow-up with pulmonologist Dr. Annella postdischarge for severe COPD, sarcoidosis, ILD.  Hypokalemia- Oral potassium replacements ordered. Monitor daily electrolytes.  Hypertension: BP elevated. Resumed home dose Norvasc , coreg .  GERD: Continue PPI.  Morbid obesity: BMI 48.02- Diet, exercise and weight reduction advised. Check A1c as she is on steroids for a while now.       Out of bed to chair. Incentive spirometry. Nursing supportive care. Fall, aspiration precautions. Diet:  Diet Orders (From admission, onward)     Start     Ordered   02/10/24 0121  Diet Heart Room service appropriate? Yes; Fluid consistency: Thin  Diet effective now       Question Answer Comment  Room service appropriate? Yes   Fluid consistency: Thin      02/10/24 0120           DVT prophylaxis:    Level of care: Telemetry   Code Status: Full Code  Subjective: Patient is seen and examined today morning. She is lying in bed. Feels short of breath with exertion. Has cough. Energy poor, eating well. She is on 3L with o2 sat 100%. Advised to go down to 2L o2.  Physical Exam: Vitals:   02/10/24 1115 02/10/24 1145 02/10/24 1245 02/10/24 1316  BP: (!) 141/86 124/88 (!) 148/98 (!) 142/95  Pulse: 71 75 73 78  Resp: (!) 22 (!) 25 18   Temp:    97.6 F (36.4 C)  TempSrc:      SpO2: 100% 100% 99% 97%  Weight:      Height:        General - Middle aged obese African American female, mild respiratory distress HEENT - PERRLA, EOMI, atraumatic head, non tender sinuses. Lung - distant breath sounds, diffuse rhonchi, wheezes. Heart - S1, S2 heard, no murmurs, rubs, 1+ pedal edema. Abdomen - Soft, non tender, obese, bowel sounds good Neuro - Alert, awake and oriented x 3, non focal exam. Skin - Warm and dry.  Data Reviewed:      Latest Ref Rng & Units 02/10/2024    5:10 AM 02/09/2024    7:01 PM 02/09/2024    7:00 PM  CBC  WBC 4.0 - 10.5 K/uL 9.1     Hemoglobin 12.0 - 15.0 g/dL 9.4  89.0  89.0   Hematocrit 36.0 - 46.0 % 32.0  32.0  32.0   Platelets 150 - 400 K/uL 273         Latest Ref Rng & Units 02/10/2024  5:10 AM 02/09/2024    7:21 PM 02/09/2024    7:01 PM  BMP  Glucose 70 - 99 mg/dL 785  871  867   BUN 6 - 20 mg/dL 12  13  13    Creatinine 0.44 - 1.00 mg/dL 9.16  9.12  8.99   Sodium 135 - 145 mmol/L 143  143  145   Potassium 3.5 - 5.1 mmol/L 3.2  3.8  3.3   Chloride 98 - 111 mmol/L 101  102  97   CO2 22 - 32 mmol/L 30  34    Calcium  8.9 - 10.3 mg/dL 8.9  8.8     DG Chest Portable 1 View Result Date: 02/09/2024 CLINICAL DATA:  Shortness of breath. EXAM: PORTABLE CHEST 1 VIEW COMPARISON:  December 30, 2023 FINDINGS: The heart size and mediastinal contours are within normal limits. There is mild prominence of the pulmonary vasculature. Mild linear scarring and/or atelectasis  is seen within the mid right lung. No acute infiltrate, pleural effusion or pneumothorax is identified. No acute osseous abnormalities are noted. IMPRESSION: 1. Mild pulmonary vascular congestion. 2. Mild mid right lung linear scarring and/or atelectasis. Electronically Signed   By: Suzen Dials M.D.   On: 02/09/2024 18:57    Family Communication: Discussed with patient, understand and agree. All questions answered.  Disposition: Status is: Inpatient Remains inpatient appropriate because: steroids, antibiotics, monitor respiratory status.  Planned Discharge Destination: Home with Home Health     Time spent: 44 minutes  Author: Concepcion Riser, MD 02/10/2024 1:47 PM Secure chat 7am to 7pm For on call review [email or web address].    "

## 2024-02-10 NOTE — ED Notes (Signed)
 Floor notified patient coming up

## 2024-02-11 ENCOUNTER — Ambulatory Visit: Payer: Self-pay | Admitting: Nurse Practitioner

## 2024-02-11 DIAGNOSIS — J9621 Acute and chronic respiratory failure with hypoxia: Secondary | ICD-10-CM | POA: Diagnosis not present

## 2024-02-11 LAB — HEMOGLOBIN A1C
Hgb A1c MFr Bld: 5.2 % (ref 4.8–5.6)
Mean Plasma Glucose: 102.54 mg/dL

## 2024-02-11 MED ORDER — CARVEDILOL 12.5 MG PO TABS
12.5000 mg | ORAL_TABLET | Freq: Two times a day (BID) | ORAL | Status: DC
  Administered 2024-02-11 – 2024-02-13 (×4): 12.5 mg via ORAL
  Filled 2024-02-11 (×5): qty 1

## 2024-02-11 MED ORDER — ENSURE PLUS HIGH PROTEIN PO LIQD
237.0000 mL | Freq: Two times a day (BID) | ORAL | Status: DC
  Administered 2024-02-11 – 2024-02-13 (×4): 237 mL via ORAL

## 2024-02-11 MED ORDER — HYDROCHLOROTHIAZIDE 25 MG PO TABS
25.0000 mg | ORAL_TABLET | Freq: Every day | ORAL | Status: DC
  Administered 2024-02-11 – 2024-02-13 (×3): 25 mg via ORAL
  Filled 2024-02-11 (×3): qty 1

## 2024-02-11 MED ORDER — ADULT MULTIVITAMIN W/MINERALS CH
1.0000 | ORAL_TABLET | Freq: Every day | ORAL | Status: DC
  Administered 2024-02-11 – 2024-02-13 (×3): 1 via ORAL
  Filled 2024-02-11 (×3): qty 1

## 2024-02-11 MED ORDER — BUDESONIDE 0.25 MG/2ML IN SUSP
0.2500 mg | Freq: Two times a day (BID) | RESPIRATORY_TRACT | Status: DC
  Administered 2024-02-11 – 2024-02-13 (×5): 0.25 mg via RESPIRATORY_TRACT
  Filled 2024-02-11 (×5): qty 2

## 2024-02-11 MED ORDER — METHYLPREDNISOLONE SODIUM SUCC 40 MG IJ SOLR
40.0000 mg | Freq: Two times a day (BID) | INTRAMUSCULAR | Status: DC
  Administered 2024-02-11 – 2024-02-13 (×5): 40 mg via INTRAVENOUS
  Filled 2024-02-11 (×5): qty 1

## 2024-02-11 NOTE — Discharge Instructions (Addendum)
 Low Sodium Nutrition Therapy  Eating less sodium can help you if you have high blood pressure, heart failure, or kidney or liver disease.   Your body needs a little sodium, but too much sodium can cause your body to hold onto extra water. This extra water will raise your blood pressure and can cause damage to your heart, kidneys, or liver as they are forced to work harder.   Sometimes you can see how the extra fluid affects you because your hands, legs, or belly swell. You may also hold water around your heart and lungs, which makes it hard to breathe.   Even if you take medication for blood pressure or a water pill (diuretic) to remove fluid, it is still important to have less salt in your diet.   Check with your primary care provider before drinking alcohol since it may affect the amount of fluid in your body and how your heart, kidneys, or liver work. Sodium in Food A low-sodium meal plan limits the sodium that you get from food and beverages to 1,500-2,000 milligrams (mg) per day. Salt is the main source of sodium. Read the nutrition label on the package to find out how much sodium is in one serving of a food.  . Select foods with 140 milligrams (mg) of sodium or less per serving.  . You may be able to eat one or two servings of foods with a little more than 140 milligrams (mg) of sodium if you are closely watching how much sodium you eat in a day.  . Check the serving size on the label. The amount of sodium listed on the label shows the amount in one serving of the food. So, if you eat more than one serving, you will get more sodium than the amount listed.  Tips Cutting Back on Sodium . Eat more fresh foods.  . Fresh fruits and vegetables are low in sodium, as well as frozen vegetables and fruits that have no added juices or sauces.  . Fresh meats are lower in sodium than processed meats, such as bacon, sausage, and hotdogs.  . Not all processed foods are unhealthy, but some processed foods  may have too much sodium.  . Eat less salt at the table and when cooking. One of the ingredients in salt is sodium.  . One teaspoon of table salt has 2,300 milligrams of sodium.  . Leave the salt out of recipes for pasta, casseroles, and soups. . Be a smart shopper.  . Food packages that say "Salt-free", sodium-free", "very low sodium," and "low sodium" have less than 140 milligrams of sodium per serving.  . Beware of products identified as "Unsalted," "No Salt Added," "Reduced Sodium," or "Lower Sodium." These items may still be high in sodium. You should always check the nutrition label. . Add flavors to your food without adding sodium.  . Try lemon juice, lime juice, or vinegar.  . Dry or fresh herbs add flavor.  Arnoldo Morale a sodium-free seasoning blend or make your own at home. . You can purchase salt-free or sodium-free condiments like barbeque sauce in stores and online. Ask your registered dietitian nutritionist for recommendations and where to find them.  .  Eating in Restaurants . Choose foods carefully when you eat outside your home. Restaurant foods can be very high in sodium. Many restaurants provide nutrition facts on their menus or their websites. If you cannot find that information, ask your server. Let your server know that you want your food  to be cooked without salt and that you would like your salad dressing and sauces to be served on the side.  .   . Foods Recommended . Food Group . Foods Recommended  . Grains . Bread, bagels, rolls without salted tops Homemade bread made with reduced-sodium baking powder Cold cereals, especially shredded wheat and puffed rice Oats, grits, or cream of wheat Pastas, quinoa, and rice Popcorn, pretzels or crackers without salt Corn tortillas  . Protein Foods . Fresh meats and fish; [place] bacon (check the nutrition labels - make sure they are not packaged in a sodium solution) Canned or packed tuna (no more than 4 ounces at 1 serving) Beans  and peas Soybeans) and tofu Eggs Nuts or nut butters without salt  . Dairy . Milk or milk powder Plant milks, such as rice and soy Yogurt, including Greek yogurt Small amounts of natural cheese (blocks of cheese) or reduced-sodium cheese can be used in moderation. (Swiss, ricotta, and fresh mozzarella cheese are lower in sodium than the others) Cream Cheese Low sodium cottage cheese  . Vegetables . Fresh and frozen vegetables without added sauces or salt Homemade soups (without salt) Low-sodium, salt-free or sodium-free canned vegetables and soups  . Fruit . Fresh and canned fruits Dried fruits, such as raisins, cranberries, and prunes  . Oils . Tub or liquid margarine, regular or without salt [place]ola, corn, peanut, olive, safflower, or sunflower oils  . Condiments . Fresh or dried herbs such as basil, bay leaf, dill, mustard (dry), nutmeg, paprika, parsley, rosemary, sage, or thyme.  Low sodium ketchup Vinegar  Lemon or lime juice Pepper, red pepper flakes, and cayenne. Hot sauce contains sodium, but if you use just a drop or two, it will not add up to much.  Salt-free or sodium-free seasoning mixes and marinades Simple salad dressings: vinegar and oil  .  Marland Kitchen Foods Not Recommended . Food Group . Foods Not Recommended  . Grains . Breads or crackers topped with salt Cereals (hot/cold) with more than 300 mg sodium per serving Biscuits, cornbread, and other "quick" breads prepared with baking soda Pre-packaged bread crumbs Seasoned and packaged rice and pasta mixes Self-rising flours  . Protein Foods . Cured meats: Bacon, ham, sausage, pepperoni and hot dogs Canned meats (chili, vienna sausage, or sardines) Smoked fish and meats Frozen meals that have more than 600 mg of sodium per serving Egg substitute (with added sodium)  . Dairy . Buttermilk Processed cheese spreads Cottage cheese (1 cup may have over 500 mg of sodium; look for low-sodium.) American or feta cheese Shredded  Cheese has more sodium than blocks of cheese String cheese  . Vegetables . Canned vegetables (unless they are salt-free, sodium-free or low sodium) Frozen vegetables with seasoning and sauces Sauerkraut and pickled vegetables Canned or dried soups (unless they are salt-free, sodium-free, or low sodium) [place] fries and onion rings  . Fruit . Dried fruits preserved with additives that have sodium  . Oils . Salted butter or margarine, all types of olives  . Condiments . Salt, sea salt, kosher salt, onion salt, and garlic salt Seasoning mixes with salt Bouillon cubes Ketchup Barbeque sauce and Worcestershire sauce unless low sodium Soy sauce Salsa, pickles, olives, relish Salad dressings: ranch, blue cheese, [place], and [place].  .  . Low Sodium Sample 1-Day Menu  . Breakfast . 1 cup cooked oatmeal  . 1 slice whole wheat bread toast  . 1 tablespoon peanut butter without salt  . 1 banana  .  1 cup 1% milk  . Lunch . Tacos made with: 2 corn tortillas  .  cup black beans, low sodium  .  cup roasted or grilled chicken (without skin)  .  avocado  . Squeeze of lime juice  . 1 cup salad greens  . 1 tablespoon low-sodium salad dressing  .  cup strawberries  . 1 orange  . Afternoon Snack . 1/3 cup grapes  . 6 ounces yogurt  . Evening Meal . 3 ounces herb-baked fish  . 1 baked potato  . 2 teaspoons olive oil  .  cup cooked carrots  . 2 thick slices tomatoes on:  . 2 lettuce leaves  . 1 teaspoon olive oil  . 1 teaspoon balsamic vinegar  . 1 cup 1% milk  . Evening Snack . 1 apple  .  cup almonds without salt  .  Marland Kitchen Low-Sodium Vegetarian (Lacto-Ovo) Sample 1-Day Menu  . Breakfast . 1 cup cooked oatmeal  . 1 slice whole wheat toast  . 1 tablespoon peanut butter without salt  . 1 banana  . 1 cup 1% milk  . Lunch . Tacos made with: 2 corn tortillas  .  cup black beans, low sodium  .  cup roasted or grilled chicken (without skin)  .  avocado  . Squeeze of lime juice  . 1  cup salad greens  . 1 tablespoon low-sodium salad dressing  .  cup strawberries  . 1 orange  . Evening Meal . Stir fry made with:  cup tofu  . 1 cup brown rice  .  cup broccoli  .  cup green beans  .  cup peppers  .  tablespoon peanut oil  . 1 orange  . 1 cup 1% milk  . Evening Snack . 4 strips celery  . 2 tablespoons hummus  . 1 hard-boiled egg  .  Marland Kitchen Low-Sodium Vegan Sample 1-Day Menu  . Breakfast . 1 cup cooked oatmeal  . 1 tablespoon peanut butter without salt  . 1 cup blueberries  . 1 cup soymilk fortified with calcium, vitamin B12, and vitamin D  . Lunch . 1 small whole wheat pita  .  cup cooked lentils  . 2 tablespoons hummus  . 4 carrot sticks  . 1 medium apple  . 1 cup soymilk fortified with calcium, vitamin B12, and vitamin D  . Evening Meal . Stir fry made with:  cup tofu  . 1 cup brown rice  .  cup broccoli  .  cup green beans  .  cup peppers  .  tablespoon peanut oil  . 1 cup cantaloupe  . Evening Snack . 1 cup soy yogurt  .  cup mixed nuts  . Copyright 2020  Academy of Nutrition and Dietetics. All rights reserved .  Marland Kitchen Sodium Free Flavoring Tips .  Marland Kitchen When cooking, the following items may be used for flavoring instead of salt or seasonings that contain sodium. . Remember: A little bit of spice goes a long way! Be careful not to overseason. Marland Kitchen Spice Blend Recipe (makes about ? cup) . 5 teaspoons onion powder  . 2 teaspoons garlic powder  . 2 teaspoons paprika  . 2 teaspoon dry mustard  . 1 teaspoon crushed thyme leaves  .  teaspoon white pepper  .  teaspoon celery seed Food Item Flavorings  Beef Basil, bay leaf, caraway, curry, dill, dry mustard, garlic, grape jelly, green pepper, mace, marjoram, mushrooms (fresh), nutmeg, onion or onion powder, parsley, pepper,  rosemary, sage  Chicken Basil, cloves, cranberries, mace, mushrooms (fresh), nutmeg, oregano, paprika, parsley, pineapple, saffron, sage, savory, tarragon, thyme, tomato,  turmeric  Egg Chervil, curry, dill, dry mustard, garlic or garlic powder, green pepper, jelly, mushrooms (fresh), nutmeg, onion powder, paprika, parsley, rosemary, tarragon, tomato  Fish Basil, bay leaf, chervil, curry, dill, dry mustard, green pepper, lemon juice, marjoram, mushrooms (fresh), paprika, pepper, tarragon, tomato, turmeric  Lamb Cloves, curry, dill, garlic or garlic powder, mace, mint, mint jelly, onion, oregano, parsley, pineapple, rosemary, tarragon, thyme  Pork Applesauce, basil, caraway, chives, cloves, garlic or garlic powder, onion or onion powder, rosemary, thyme  Veal Apricots, basil, bay leaf, currant jelly, curry, ginger, marjoram, mushrooms (fresh), oregano, paprika  Vegetables Basil, dill, garlic or garlic powder, ginger, lemon juice, mace, marjoram, nutmeg, onion or onion powder, tarragon, tomato, sugar or sugar substitute, salt-free salad dressing, vinegar  Desserts Allspice, anise, cinnamon, cloves, ginger, mace, nutmeg, vanilla extract, other extracts   Copyright 2020  Academy of Nutrition and Dietetics. All rights reserved

## 2024-02-11 NOTE — Plan of Care (Signed)

## 2024-02-11 NOTE — Evaluation (Signed)
 Occupational Therapy Evaluation Patient Details Name: Teresa Mendez MRN: [id number] DOB: 02/16/1975 Today's Date: 02/11/2024   History of Present Illness   Pt is a 49 y.o. female who presented 02/09/24 due to chronic hypoxic respiratory failure secondary to COPD exacerbation. PMH: COPD asthma syndrome, essential hypertension, sarcoidosis, stress incontinence, GERD, interstitial lung disease who is on 3 L of oxygen  at home     Clinical Impressions Pt reported at PLOF they use a RW PRN and live children with an aide who comes every day for 2 hours and assists as needed. At this time limited due to abdomen pain but agree to attempt to engage with therapy. She was able to complete sit to stand with CGA and ambulated with RW and agreed to remain OOB in chair with arms propend up onto walker while sitting as relieved some pain. She did report SOB with ambulation but o2 was at 97% while on 3L and was left on 2L at still at 97%. At this time recommendation for [facility] at discharge and Acute Occupational Therapy to follow.      If plan is discharge home, recommend the following:   Assistance with cooking/housework;A little help with bathing/dressing/bathroom;Assist for transportation;Help with stairs or ramp for entrance     Functional Status Assessment   Patient has had a recent decline in their functional status and demonstrates the ability to make significant improvements in function in a reasonable and predictable amount of time.     Equipment Recommendations   None recommended by OT     Recommendations for Other Services         Precautions/Restrictions   Precautions Precautions: Fall Recall of Precautions/Restrictions: Intact Restrictions Weight Bearing Restrictions Per Provider Order: No     Mobility Bed Mobility Overal bed mobility: Modified Independent             General bed mobility comments: used rails on bed to prop self up    Transfers Overall transfer  level: Needs assistance Equipment used: Rolling walker (2 wheels) Transfers: Sit to/from Stand Sit to Stand: Contact guard assist           General transfer comment: increase in time with positional movements      Balance Overall balance assessment: Needs assistance Sitting-balance support: Feet supported Sitting balance-Leahy Scale: Good     Standing balance support: Bilateral upper extremity supported, No upper extremity supported Standing balance-Leahy Scale: Fair Standing balance comment: does better with RW                           ADL either performed or assessed with clinical judgement   ADL Overall ADL's : Needs assistance/impaired Eating/Feeding: Independent;Sitting   Grooming: Wash/dry face;Set up;Sitting   Upper Body Bathing: Set up;Sitting   Lower Body Bathing: Set up;Sit to/from stand   Upper Body Dressing : Set up;Sitting   Lower Body Dressing: Set up;Sit to/from stand   Toilet Transfer: Contact guard assist;Cueing for safety;Cueing for sequencing;Rolling walker (2 wheels)   Toileting- Clothing Manipulation and Hygiene: Contact guard assist;Sit to/from stand       Functional mobility during ADLs: Contact guard assist;Rolling walker (2 wheels)       Vision         Perception Perception: Within Functional Limits       Praxis Praxis: WFL       Pertinent Vitals/Pain Pain Assessment Pain Assessment: Faces Faces Pain Scale: Hurts little more Pain Location: abdomen Pain Descriptors /  Indicators: Discomfort, Grimacing, Guarding Pain Intervention(s): Limited activity within patient's tolerance, Monitored during session, Repositioned     Extremity/Trunk Assessment Upper Extremity Assessment Upper Extremity Assessment: Overall WFL for tasks assessed   Lower Extremity Assessment Lower Extremity Assessment: Defer to PT evaluation   Cervical / Trunk Assessment Cervical / Trunk Assessment: Kyphotic   Communication  Communication Communication: No apparent difficulties   Cognition Arousal: Alert Behavior During Therapy: WFL for tasks assessed/performed Cognition: No apparent impairments                               Following commands: Intact       Cueing  General Comments   Cueing Techniques: Verbal cues  Pt presented on 3L at 98% and was left on 2L at 97%.   Exercises     Shoulder Instructions      Home Living Family/patient expects to be discharged to:: Private residence Living Arrangements: Children Available Help at Discharge: Family;Available 24 hours/day Type of Home: House Home Access: Ramped entrance     Home Layout: One level     Bathroom Shower/Tub: Producer, Television/film/video: Standard Bathroom Accessibility: Yes How Accessible: Accessible via walker Home Equipment: Rolling Walker (2 wheels);Shower seat   Additional Comments: Pt reported they ahve an aide who comes every day for 2 hours to set up for the day as needed      Prior Functioning/Environment Prior Level of Function : Independent/Modified Independent             Mobility Comments: pt reported pending how they feel if they use walker or not, if going out of the houe always with family ADLs Comments: mod I (increase in time)    OT Problem List: Decreased activity tolerance;Decreased safety awareness;Pain   OT Treatment/Interventions: Self-care/ADL training;Therapeutic exercise;Therapeutic activities;Patient/family education;Balance training      OT Goals(Current goals can be found in the care plan section)   Acute Rehab OT Goals Patient Stated Goal: to have relief from abdomen pain OT Goal Formulation: With patient Time For Goal Achievement: 02/25/24 Potential to Achieve Goals: Good   OT Frequency:  Min 2X/week    Co-evaluation              AM-PAC OT 6 Clicks Daily Activity     Outcome Measure Help from another person eating meals?: None Help from another  person taking care of personal grooming?: None Help from another person toileting, which includes using toliet, bedpan, or urinal?: A Little Help from another person bathing (including washing, rinsing, drying)?: A Little Help from another person to put on and taking off regular upper body clothing?: None Help from another person to put on and taking off regular lower body clothing?: A Little 6 Click Score: 21   End of Session Equipment Utilized During Treatment: Gait belt;Rolling walker (2 wheels) Nurse Communication: Mobility status  Activity Tolerance: Patient tolerated treatment well Patient left: in chair;with call bell/phone within reach  OT Visit Diagnosis: Muscle weakness (generalized) (M62.81);Pain Pain - part of body:  (abdomen)                Time: 8984-8947 OT Time Calculation (min): 37 min Charges:  OT General Charges $OT Visit: 1 Visit OT Evaluation $OT Eval Low Complexity: 1 Low OT Treatments $Self Care/Home Management : 8-22 mins  Warrick POUR OTR/L  Acute Rehab Services  [phone] office number   Warrick Berber 02/11/2024, 11:01 AM

## 2024-02-11 NOTE — Progress Notes (Signed)
 Initial Nutrition Assessment  DOCUMENTATION CODES:   Obesity unspecified  INTERVENTION:   -Ensure Plus High Protein po BID, each supplement provides 350 kcal and 20 grams of protein.   -Multivitamin with minerals daily  -Provided Low Sodium Nutrition Therapy handout in AVS. Reviewed principles with patient and patient's son at bedside.  -Encouraged consistent PO intakes  NUTRITION DIAGNOSIS:   Increased nutrient needs related to chronic illness as evidenced by estimated needs.  GOAL:   Patient will meet greater than or equal to 90% of their needs  MONITOR:   PO intake, Supplement acceptance  REASON FOR ASSESSMENT:   Consult COPD Protocol  ASSESSMENT:   49 y.o. female with medical history significant of COPD asthma syndrome, essential hypertension, sarcoidosis, stress incontinence, GERD, interstitial lung disease who is on 3 L of oxygen  at home and quit smoking about 5 years ago presenting to the ER with shortness of breath cough and wheezing.  Patient is admitted to hospitalist service for further management evaluation of acute on chronic hypoxic respiratory failure secondary to COPD exacerbation.  Patient in room, son at bedside. Pt just received lunch tray. Pt states she is eating better now but PTA her appetite would fluctuate. Per son, pt would go a day or two without eating anything at all. When patient does consume meals it is primarily meats (beef, chicken) and salads. Pt doesn't consume many sides or fruits/vegetables.  States that when she eats she feels a burning sensation in her stomach and sometimes up into her throat. She does take medication for reflux. Pt is concerned about her sodium intake so will provide low sodium handout for her and her son to review as he is the primary cook in their house.  Pt denies any issues with swallowing, chewing and taste. Pt does reports vision changes over the past month and a half, was found to have a cataract and saw an eye  doctor recently.  Per weight records, pt's weight has been trending up since 2018.    Medications: Pepcid , Prednisone , KLOR-CON   Labs reviewed: Low potassium  Coronavirus HKU1 +  NUTRITION - FOCUSED PHYSICAL EXAM:  Flowsheet Row Most Recent Value  Orbital Region No depletion  Upper Arm Region No depletion  Thoracic and Lumbar Region No depletion  Buccal Region No depletion  Temple Region No depletion  Clavicle Bone Region No depletion  Clavicle and Acromion Bone Region No depletion  Scapular Bone Region No depletion  Dorsal Hand No depletion  Patellar Region No depletion  Anterior Thigh Region No depletion  Posterior Calf Region No depletion  Edema (RD Assessment) None  Hair Unable to assess  [covered]  Eyes Reviewed  Mouth Reviewed  [poor dentition]  Skin Reviewed  [dry]  Nails Reviewed    Diet Order:   Diet Order             Diet Heart Room service appropriate? Yes; Fluid consistency: Thin  Diet effective now                   EDUCATION NEEDS:   Education needs have been addressed  Skin:  Skin Assessment: Reviewed RN Assessment  Last BM:  PTA  Height:   Ht Readings from Last 1 Encounters:  02/09/24 5' 4 (1.626 m)    Weight:   Wt Readings from Last 1 Encounters:  02/09/24 126.9 kg    BMI:  Body mass index is 48.02 kg/m.  Estimated Nutritional Needs:   Kcal:  1650-1850  Protein:  80-95g  Fluid:  1.8L/day  Morna Lee, MS, RD, LDN Inpatient Clinical Dietitian Contact via Secure chat

## 2024-02-11 NOTE — Evaluation (Signed)
 Physical Therapy Evaluation and Discharge  Patient Details Name: Teresa Mendez MRN: [id number] DOB: Jul 31, 1975 Today's Date: 02/11/2024  History of Present Illness  Pt is a 49 y.o. female who presented 02/09/24 due to chronic hypoxic respiratory failure secondary to COPD exacerbation. PMH: COPD asthma syndrome, essential hypertension, sarcoidosis, stress incontinence, GERD, interstitial lung disease who is on 3 L of oxygen  at home  Clinical Impression  Pt is currently presenting at mod I for bed mobility, sit to stand and gait with RW. Pt was able to use the rest room without assistance standing from regular height toilet. Pt is limited due to respiratory status though remained in the 90's on 3L O2 via [place]. Pt reports she is on 3L O2 via [place] at baseline; though wall O2 at 2L due to pt states MD turned it down to 2L. Pt initially wanted to remove O2 while using the toilet but was advised to keep on and seems to have mobilized better than she did earlier in the day. Pt will benefit from continued mobility while in the hospital by mobility team and nursing staff but due to pt is at baseline level of functioning does not have needs for acute physical therapy services at this time. Pt will benefit from continued OPPT on discharge from acute care hospital setting. Please re-consult if further needs arise.         If plan is discharge home, recommend the following: Help with stairs or ramp for entrance;Assistance with cooking/housework     Equipment Recommendations None recommended by PT     Functional Status Assessment Patient has not had a recent decline in their functional status     Precautions / Restrictions Precautions Precautions: Fall Recall of Precautions/Restrictions: Intact Restrictions Weight Bearing Restrictions Per Provider Order: No      Mobility  Bed Mobility Overal bed mobility: Modified Independent             General bed mobility comments: slow movements     Transfers Overall transfer level: Modified independent Equipment used: Rolling walker (2 wheels)   Sit to Stand: Modified independent (Device/Increase time)      Ambulation/Gait Ambulation/Gait assistance: Modified independent (Device/Increase time) Gait Distance (Feet): 30 Feet Assistive device: Rolling walker (2 wheels) Gait Pattern/deviations: Step-through pattern, Decreased stride length Gait velocity: decreased Gait velocity interpretation: <1.31 ft/sec, indicative of household ambulator   General Gait Details: on 3L O2     Balance Overall balance assessment: Modified Independent       Pertinent Vitals/Pain Pain Assessment Pain Assessment: No/denies pain    Home Living Family/patient expects to be discharged to:: Private residence Living Arrangements: Children Available Help at Discharge: Family;Available 24 hours/day Type of Home: House Home Access: Ramped entrance       Home Layout: One level Home Equipment: [employer] (2 wheels);Shower seat Additional Comments: Pt reported they have an aide who comes every day for 2 hours to set up for the day as needed    Prior Function Prior Level of Function : Independent/Modified Independent             Mobility Comments: pt reported pending how they feel if they use walker or not, if going out of the houe always with family ADLs Comments: mod I (increase in time)     Extremity/Trunk Assessment   Upper Extremity Assessment Upper Extremity Assessment: Defer to OT evaluation    Lower Extremity Assessment Lower Extremity Assessment: Generalized weakness    Cervical / Trunk Assessment Cervical /  Trunk Assessment: Kyphotic  Communication   Communication Communication: No apparent difficulties    Cognition Arousal: Alert Behavior During Therapy: WFL for tasks assessed/performed   PT - Cognitive impairments: No apparent impairments     Following commands: Intact       Cueing Cueing  Techniques: Verbal cues     General Comments General comments (skin integrity, edema, etc.): Pt on 3L O2 via [place] during going to the bathroom with ambulating while in the room. Pt was able to stay upright during gait. Pt wanted to remove O2 while voiding but was advised to leave on at this time.        Assessment/Plan    PT Assessment All further PT needs can be met in the next venue of care  PT Problem List Decreased activity tolerance;Decreased strength           PT Goals (Current goals can be found in the Care Plan section)  Acute Rehab PT Goals PT Goal Formulation: All assessment and education complete, DC therapy     AM-PAC PT 6 Clicks Mobility  Outcome Measure Help needed turning from your back to your side while in a flat bed without using bedrails?: None Help needed moving from lying on your back to sitting on the side of a flat bed without using bedrails?: None Help needed moving to and from a bed to a chair (including a wheelchair)?: None Help needed standing up from a chair using your arms (e.g., wheelchair or bedside chair)?: None Help needed to walk in hospital room?: None Help needed climbing 3-5 steps with a railing? : A Little 6 Click Score: 23    End of Session Equipment Utilized During Treatment: Gait belt Activity Tolerance: Patient tolerated treatment well Patient left: in bed;with call bell/phone within reach Nurse Communication: Mobility status PT Visit Diagnosis: Other abnormalities of gait and mobility (R26.89)    Time: 1253-1316 PT Time Calculation (min) (ACUTE ONLY): 23 min   Charges:   PT Evaluation $PT Eval Low Complexity: 1 Low PT Treatments $Therapeutic Activity: 8-22 mins PT General Charges $$ ACUTE PT VISIT: 1 Visit        Dorothyann Maier, DPT, CLT  Acute Rehabilitation Services Office: [phone] (Secure chat preferred)   Dorothyann VEAR Maier 02/11/2024, 1:17 PM

## 2024-02-11 NOTE — Progress Notes (Signed)
 " Progress Note   Patient: Teresa Mendez FMW:[id number] DOB: 1975-11-17 DOA: 02/09/2024     2 DOS: the patient was seen and examined on 02/11/2024   Brief hospital course: Teresa Mendez is a 49 y.o. female with medical history significant of COPD asthma syndrome, essential hypertension, sarcoidosis, stress incontinence, GERD, interstitial lung disease who is on 3 L of oxygen  at home and quit smoking about 5 years ago presenting to the ER with shortness of breath cough and wheezing.  Patient is admitted to hospitalist service for further management evaluation of acute on chronic hypoxic respiratory failure secondary to COPD exacerbation.   Assessment and Plan: Acute on chronic hypoxic respiratory failure. 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 Patient will be continued on aggressive nebulizer treatment. -Not moving much air so we will continue IV steroids -Continue IV antibiotics although NP swab was positive Continue supplemental oxygen  to maintain saturation greater than 92%.-Wears 3 L chronically -Echo: No change from prior, EF 60% with normal diastolic parameters Patient will need to follow-up with pulmonologist Dr. Annella postdischarge for severe COPD, sarcoidosis, ILD. -Doubt PE as D-dimer is 0.51  Hypokalemia- -Replete  Hypertension: BP elevated. Resumed home dose Zestril , Norvasc , coreg -adjust medications -Resume HCTZ as well for diuresis  GERD: Continue PPI.  Morbid obesity: BMI 48.02- Diet, exercise and weight reduction advised.       Out of bed to chair. Incentive spirometry. Nursing supportive care. Fall, aspiration precautions. Diet:  Diet Orders (From admission, onward)     Start     Ordered   02/10/24 0121  Diet Heart Room service appropriate? Yes; Fluid consistency: Thin  Diet effective now       Question Answer Comment  Room service appropriate? Yes   Fluid consistency: Thin       02/10/24 0120           DVT prophylaxis:   Level of care: Telemetry   Code Status: Full Code  Subjective:  Gets very short of breath when up moving  Physical Exam: Vitals:   02/11/24 0525 02/11/24 0526 02/11/24 0746 02/11/24 1139  BP: (!) 159/104  (!) 161/108 (!) 160/111  Pulse: 76 77 84 100  Resp:   18 20  Temp:   98 F (36.7 C) 97.9 F (36.6 C)  TempSrc:      SpO2: 93% 95% 95% 95%  Weight:      Height:         General: Appearance:    Severely obese female in no acute distress     Lungs:   On nasal cannula, severely diminished, not moving much air  Heart:    Tachycardic. Normal rhythm. No murmurs, rubs, or gallops.   MS:   All extremities are intact.   Neurologic:   Awake, alert     Data Reviewed:      Latest Ref Rng & Units 02/10/2024    5:10 AM 02/09/2024    7:01 PM 02/09/2024    7:00 PM  CBC  WBC 4.0 - 10.5 K/uL 9.1     Hemoglobin 12.0 - 15.0 g/dL 9.4  89.0  89.0   Hematocrit 36.0 - 46.0 % 32.0  32.0  32.0   Platelets 150 - 400 K/uL 273         Latest Ref Rng & Units 02/10/2024    5:10 AM 02/09/2024    7:21 PM 02/09/2024    7:01 PM  BMP  Glucose 70 -  99 mg/dL 785  871  867   BUN 6 - 20 mg/dL 12  13  13    Creatinine 0.44 - 1.00 mg/dL 9.16  9.12  8.99   Sodium 135 - 145 mmol/L 143  143  145   Potassium 3.5 - 5.1 mmol/L 3.2  3.8  3.3   Chloride 98 - 111 mmol/L 101  102  97   CO2 22 - 32 mmol/L 30  34    Calcium  8.9 - 10.3 mg/dL 8.9  8.8     ECHOCARDIOGRAM COMPLETE Result Date: 02/10/2024    ECHOCARDIOGRAM REPORT   Patient Name:   Teresa Mendez Date of Exam: 02/10/2024 Medical Rec #:  [id number]      Height:       64.0 in Accession #:    [id number]     Weight:       279.8 lb Date of Birth:  03-08-1975       BSA:          2.256 m Patient Age:    48 years       BP:           142/95 mmHg Patient Gender: F              HR:           93 bpm. Exam Location:  Inpatient Procedure: 2D Echo, Cardiac Doppler and Color Doppler (Both Spectral and Color            Flow  Doppler were utilized during procedure). Indications:    Dyspnea  History:        Patient has prior history of Echocardiogram examinations, most                 recent 05/18/2019. Signs/Symptoms:Dyspnea; Risk                 Factors:Hypertension, Former Smoker and GERD, Sarcoidosis.  Sonographer:    Koleen Popper RDCS Referring Phys: [id number] CONCEPCION RISER  Sonographer Comments: Image acquisition challenging due to patient body habitus. IMPRESSIONS  1. Left ventricular ejection fraction, by estimation, is 60 to 65%. The left ventricle has normal function. The left ventricle has no regional wall motion abnormalities. There is mild concentric left ventricular hypertrophy. Left ventricular diastolic parameters were normal.  2. Right ventricular systolic function is normal. The right ventricular size is normal. Tricuspid regurgitation signal is inadequate for assessing PA pressure.  3. The mitral valve was not well visualized. No evidence of mitral valve regurgitation. No evidence of mitral stenosis.  4. The aortic valve is grossly normal. Aortic valve regurgitation is not visualized. No aortic stenosis is present.  5. Aortic dilatation noted. There is borderline dilatation of the ascending aorta, measuring 39 mm.  6. The inferior vena cava is dilated in size with >50% respiratory variability, suggesting right atrial pressure of 8 mmHg. Comparison(s): No significant change from prior study. FINDINGS  Left Ventricle: Left ventricular ejection fraction, by estimation, is 60 to 65%. The left ventricle has normal function. The left ventricle has no regional wall motion abnormalities. The left ventricular internal cavity size was normal in size. There is  mild concentric left ventricular hypertrophy. Left ventricular diastolic parameters were normal. Right Ventricle: The right ventricular size is normal. No increase in right ventricular wall thickness. Right ventricular systolic function is normal. Tricuspid  regurgitation signal is inadequate for assessing PA pressure. Left Atrium: Left atrial size was normal in size. Right Atrium: Right atrial size was normal  in size. Pericardium: There is no evidence of pericardial effusion. Mitral Valve: The mitral valve was not well visualized. No evidence of mitral valve regurgitation. No evidence of mitral valve stenosis. Tricuspid Valve: The tricuspid valve is not well visualized. Tricuspid valve regurgitation is not demonstrated. No evidence of tricuspid stenosis. Aortic Valve: The aortic valve is grossly normal. Aortic valve regurgitation is not visualized. No aortic stenosis is present. Pulmonic Valve: The pulmonic valve was not well visualized. Pulmonic valve regurgitation is not visualized. No evidence of pulmonic stenosis. Aorta: Aortic dilatation noted. There is borderline dilatation of the ascending aorta, measuring 39 mm. Venous: The inferior vena cava is dilated in size with greater than 50% respiratory variability, suggesting right atrial pressure of 8 mmHg. IAS/Shunts: The atrial septum is grossly normal.  LEFT VENTRICLE PLAX 2D LVIDd:         4.90 cm      Diastology LVIDs:         3.10 cm      LV e' medial:    8.70 cm/s LV PW:         1.30 cm      LV E/e' medial:  8.7 LV IVS:        1.40 cm      LV e' lateral:   7.72 cm/s LVOT diam:     2.10 cm      LV E/e' lateral: 9.8 LV SV:         58 LV SV Index:   26 LVOT Area:     3.46 cm  LV Volumes (MOD) LV vol d, MOD A2C: 159.0 ml LV vol d, MOD A4C: 147.0 ml LV vol s, MOD A2C: 53.0 ml LV vol s, MOD A4C: 62.3 ml LV SV MOD A2C:     106.0 ml LV SV MOD A4C:     147.0 ml LV SV MOD BP:      99.4 ml RIGHT VENTRICLE             IVC RV S prime:     13.60 cm/s  IVC diam: 2.70 cm TAPSE (M-mode): 2.5 cm LEFT ATRIUM             Index        RIGHT ATRIUM           Index LA diam:        4.00 cm 1.77 cm/m   RA Area:     13.20 cm LA Vol (A2C):   50.2 ml 22.25 ml/m  RA Volume:   33.50 ml  14.85 ml/m LA Vol (A4C):   39.4 ml 17.46 ml/m LA  Biplane Vol: 48.2 ml 21.36 ml/m  AORTIC VALVE LVOT Vmax:   107.00 cm/s LVOT Vmean:  69.200 cm/s LVOT VTI:    0.167 m  AORTA Ao Root diam: 3.00 cm Ao Asc diam:  3.90 cm MITRAL VALVE MV Area (PHT): 4.80 cm     SHUNTS MV Decel Time: 158 msec     Systemic VTI:  0.17 m MV E velocity: 75.90 cm/s   Systemic Diam: 2.10 cm MV A velocity: 108.00 cm/s MV E/A ratio:  0.70 Shelda Bruckner MD Electronically signed by Shelda Bruckner MD Signature Date/Time: 02/10/2024/7:26:38 PM    Final    DG Chest Portable 1 View Result Date: 02/09/2024 CLINICAL DATA:  Shortness of breath. EXAM: PORTABLE CHEST 1 VIEW COMPARISON:  December 30, 2023 FINDINGS: The heart size and mediastinal contours are within normal limits. There is mild prominence of the pulmonary  vasculature. Mild linear scarring and/or atelectasis is seen within the mid right lung. No acute infiltrate, pleural effusion or pneumothorax is identified. No acute osseous abnormalities are noted. IMPRESSION: 1. Mild pulmonary vascular congestion. 2. Mild mid right lung linear scarring and/or atelectasis. Electronically Signed   By: Suzen Dials M.D.   On: 02/09/2024 18:57      Disposition: Status is: Inpatient Remains inpatient appropriate because: steroids, antibiotics, monitor respiratory status.  Planned Discharge Destination: Home with Home Health     Time spent: 35 minutes  Author: Teshaun Olarte U Suleiman Finigan, DO 02/11/2024 11:41 AM Secure chat 7am to 7pm For on call review [email or web address]    "

## 2024-02-12 DIAGNOSIS — J441 Chronic obstructive pulmonary disease with (acute) exacerbation: Secondary | ICD-10-CM

## 2024-02-12 DIAGNOSIS — J9621 Acute and chronic respiratory failure with hypoxia: Secondary | ICD-10-CM | POA: Diagnosis not present

## 2024-02-12 DIAGNOSIS — D869 Sarcoidosis, unspecified: Secondary | ICD-10-CM | POA: Diagnosis not present

## 2024-02-12 DIAGNOSIS — I10 Essential (primary) hypertension: Secondary | ICD-10-CM

## 2024-02-12 DIAGNOSIS — J849 Interstitial pulmonary disease, unspecified: Secondary | ICD-10-CM | POA: Diagnosis not present

## 2024-02-12 DIAGNOSIS — J4489 Other specified chronic obstructive pulmonary disease: Secondary | ICD-10-CM | POA: Diagnosis not present

## 2024-02-12 LAB — CBC
HCT: 32.4 % — ABNORMAL LOW (ref 36.0–46.0)
Hemoglobin: 9.8 g/dL — ABNORMAL LOW (ref 12.0–15.0)
MCH: 23 pg — ABNORMAL LOW (ref 26.0–34.0)
MCHC: 30.2 g/dL (ref 30.0–36.0)
MCV: 75.9 fL — ABNORMAL LOW (ref 80.0–100.0)
Platelets: 318 K/uL (ref 150–400)
RBC: 4.27 MIL/uL (ref 3.87–5.11)
RDW: 14.6 % (ref 11.5–15.5)
WBC: 14.5 K/uL — ABNORMAL HIGH (ref 4.0–10.5)
nRBC: 0 % (ref 0.0–0.2)

## 2024-02-12 LAB — BASIC METABOLIC PANEL WITH GFR
Anion gap: 8 (ref 5–15)
BUN: 18 mg/dL (ref 6–20)
CO2: 35 mmol/L — ABNORMAL HIGH (ref 22–32)
Calcium: 9.2 mg/dL (ref 8.9–10.3)
Chloride: 96 mmol/L — ABNORMAL LOW (ref 98–111)
Creatinine, Ser: 0.8 mg/dL (ref 0.44–1.00)
GFR, Estimated: >60 mL/min
Glucose, Bld: 163 mg/dL — ABNORMAL HIGH (ref 70–99)
Potassium: 3.8 mmol/L (ref 3.5–5.1)
Sodium: 140 mmol/L (ref 135–145)

## 2024-02-12 MED ORDER — IPRATROPIUM-ALBUTEROL 0.5-2.5 (3) MG/3ML IN SOLN
3.0000 mL | Freq: Four times a day (QID) | RESPIRATORY_TRACT | Status: DC
  Administered 2024-02-12 – 2024-02-13 (×3): 3 mL via RESPIRATORY_TRACT
  Filled 2024-02-12 (×3): qty 3

## 2024-02-12 NOTE — Progress Notes (Signed)
 Transition of Care [facility]) - Inpatient Brief Assessment   Patient Details  Name: Teresa Mendez MRN: [id number] Date of Birth: 1975-10-23  [facility] CM/SW Contact:    Rosaline JONELLE Joe, RN Phone Number: 02/12/2024, 12:44 PM   Clinical Narrative: CM attempted to meet with the patient but patient was sleeping and unable to wake her up at this time to speak with her regarding Outpatient referral for PT.  I will follow up later to discuss when patient is able to communicate wishes regarding OPPT clinic referral.  No other needs at this time.   Transition of Care Asessment: Insurance and Status: (P) Insurance coverage has been reviewed Patient has primary care physician: (P) Yes Home environment has been reviewed: (P) from home Prior level of function:: (P) self Prior/Current Home Services: (P) No current home services Social Drivers of Health Review: (P) SDOH reviewed needs interventions Readmission risk has been reviewed: (P) Yes Transition of care needs: (P) transition of care needs identified, TOC will continue to follow

## 2024-02-12 NOTE — Progress Notes (Signed)
 [facility]  PROGRESS NOTE  Teresa Mendez FMW:[id number] DOB: 01-15-1976 DOA: 02/09/2024 PCP: Theotis Haze ORN, NP   Brief HPI:    49 y.o. female with medical history significant of COPD asthma syndrome, essential hypertension, sarcoidosis, stress incontinence, GERD, interstitial lung disease who is on 3 L of oxygen  at home and quit smoking about 5 years ago presenting to the ER with shortness of breath cough and wheezing.  Patient is admitted to hospitalist service for further management evaluation of acute on chronic hypoxic respiratory failure secondary to COPD exacerbation.      Assessment/Plan:   Acute on chronic hypoxic respiratory failure. 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 -Continue Solu-Medrol  40 mg IV every 12 hours -Continue Pulmicort  nebulizer twice daily -Continue DuoNeb nebulizers every 6 hours -Started on ceftriaxone  and Zithromax  Continue supplemental oxygen  to maintain saturation greater than 92%.-Wears 3 L chronically -Echo: No change from prior, EF 60% with normal diastolic parameters Patient will need to follow-up with pulmonologist Dr. Annella postdischarge for severe COPD, sarcoidosis, ILD. -Doubt PE as D-dimer is 0.51   Hypokalemia- -Replete   Hypertension: BP elevated. Continue Zestril , Norvasc , HCTZ   GERD: Continue PPI.   Morbid obesity: BMI 48.02- Diet, exercise and weight reduction advised.                DVT prophylaxis: Lovenox   Medications     amLODipine   10 mg Oral Daily   atorvastatin   80 mg Oral Daily   budesonide  (PULMICORT ) nebulizer solution  0.25 mg Nebulization BID   carvedilol   12.5 mg Oral BID WC   DULoxetine   60 mg Oral Daily   enoxaparin  (LOVENOX ) injection  60 mg Subcutaneous Q24H   famotidine   20 mg Oral QHS   feeding supplement  237 mL Oral BID BM   fluticasone   2 spray Each Nare Daily   hydrochlorothiazide   25 mg Oral Daily   lisinopril    40 mg Oral Daily   loratadine   10 mg Oral Daily   methylPREDNISolone  (SOLU-MEDROL ) injection  40 mg Intravenous Q12H   multivitamin with minerals  1 tablet Oral Daily   pantoprazole   40 mg Oral Daily   potassium chloride   40 mEq Oral Daily     Data Reviewed:   CBG:  No results for input(s): GLUCAP in the last 168 hours.  SpO2: 98 % O2 Flow Rate (L/min): 3 L/min    Vitals:   02/12/24 0009 02/12/24 0437 02/12/24 0758 02/12/24 0849  BP: (!) 176/93 (!) 159/96 (!) 151/99   Pulse: 75 79 91 87  Resp:  19 20 18   Temp: 98 F (36.7 C) 98 F (36.7 C) 98.1 F (36.7 C)   TempSrc:      SpO2: 100% 98% 98% 98%  Weight:      Height:          Data Reviewed:  Basic Metabolic Panel: Recent Labs  Lab 02/09/24 1900 02/09/24 1901 02/09/24 1921 02/10/24 0510 02/12/24 0245  NA 144 145 143 143 140  K 3.3* 3.3* 3.8 3.2* 3.8  CL  --  97* 102 101 96*  CO2  --   --  34* 30 35*  GLUCOSE  --  132* 128* 214* 163*  BUN  --  13 13 12 18   CREATININE  --  1.00 0.87 0.83 0.80  CALCIUM   --   --  8.8* 8.9 9.2    CBC: Recent Labs  Lab 02/09/24 1830 02/09/24  1900 02/09/24 1901 02/10/24 0510 02/12/24 0245  WBC 9.3  --   --  9.1 14.5*  NEUTROABS 6.5  --   --   --   --   HGB 9.9* 10.9* 10.9* 9.4* 9.8*  HCT 32.8* 32.0* 32.0* 32.0* 32.4*  MCV 77.2*  --   --  77.5* 75.9*  PLT 300  --   --  273 318    LFT Recent Labs  Lab 02/09/24 1921 02/10/24 0510  AST 30 20  ALT 15 16  ALKPHOS 91 103  BILITOT 0.4 0.3  PROT 7.1 7.8  ALBUMIN 3.6 4.0     Antibiotics: Anti-infectives (From admission, onward)    Start     Dose/Rate Route Frequency Ordered Stop   02/10/24 2015  azithromycin  (ZITHROMAX ) tablet 500 mg        500 mg Oral Daily 02/10/24 1925 02/12/24 0904   02/10/24 0120  cefTRIAXone  (ROCEPHIN ) 1 g in sodium chloride  0.9 % 100 mL IVPB        1 g 200 mL/hr over 30 Minutes Intravenous Every 24 hours 02/10/24 0120 02/14/24 0959   02/09/24 1945  cefTRIAXone  (ROCEPHIN ) 2 g in  sodium chloride  0.9 % 100 mL IVPB        2 g 200 mL/hr over 30 Minutes Intravenous  Once 02/09/24 1938 02/09/24 2042   02/09/24 1945  azithromycin  (ZITHROMAX ) 500 mg in sodium chloride  0.9 % 250 mL IVPB  Status:  Discontinued        500 mg 250 mL/hr over 60 Minutes Intravenous  Once 02/09/24 1938 02/09/24 1940   02/09/24 1945  doxycycline  (VIBRAMYCIN ) 100 mg in sodium chloride  0.9 % 250 mL IVPB        100 mg 125 mL/hr over 120 Minutes Intravenous  Once 02/09/24 1940 02/09/24 2247        CONSULTS   Code Status: Full code  Family Communication: No family at bedside     Subjective   Still complains of shortness of breath   Objective    Physical Examination:  General-appears in no acute distress Heart-S1-S2, regular, no murmur auscultated Lungs-decreased breath sounds bilaterally Abdomen-soft, nontender, no organomegaly Extremities-no edema in the lower extremities Neuro-alert, oriented x3, no focal deficit noted            Teresa Mendez   [facility] If 7PM-7AM, please contact night-coverage at [email or web address], Office  [phone]   02/12/2024, 11:04 AM  LOS: 3 days

## 2024-02-12 NOTE — Plan of Care (Signed)

## 2024-02-12 NOTE — Progress Notes (Signed)
 Transition of Care [facility]) - Inpatient Brief Assessment   Patient Details  Name: Teresa Mendez MRN: [id number] Date of Birth: 12-Oct-1975  Transition of [facility]) CM/SW Contact:    Rosaline JONELLE Joe, RN Phone Number: 02/12/2024, 3:09 PM   Clinical Narrative: Patient admitted from home with COPD.  The patient is on chronic oxygen  at home through [employer] at 3L/min [place].  Other DME in the home includes RW and nebulizer machine.  The patient is already active with [facility] and plans to continue at the facility for OPPT.  No referral is needed.  Patient states that her family is able to provide transportation to home when stable.  Patient states that her son drives and she has portable oxygen  tanks at home that family can provide when she is ready to discharge.   Transition of Care Asessment: Insurance and Status: Insurance coverage has been reviewed Patient has primary care physician: Yes Home environment has been reviewed: from home Prior level of function:: self Prior/Current Home Services: No current home services Social Drivers of Health Review: (P) SDOH reviewed interventions complete Readmission risk has been reviewed: (P) Yes Transition of care needs: (P) no transition of care needs at this time

## 2024-02-12 NOTE — Plan of Care (Signed)
" °  Problem: Education: Goal: Knowledge of General Education information will improve Description: Including pain rating scale, medication(s)/side effects and non-pharmacologic comfort measures Outcome: Progressing   Problem: Health Behavior/Discharge Planning: Goal: Ability to manage health-related needs will improve Outcome: Progressing   Problem: Nutrition: Goal: Adequate nutrition will be maintained Outcome: Progressing   Problem: Coping: Goal: Level of anxiety will decrease Outcome: Progressing   Problem: Elimination: Goal: Will not experience complications related to bowel motility Outcome: Progressing   Problem: Pain Managment: Goal: General experience of comfort will improve and/or be controlled Outcome: Progressing   Problem: Safety: Goal: Ability to remain free from injury will improve Outcome: Progressing   Problem: Skin Integrity: Goal: Risk for impaired skin integrity will decrease Outcome: Progressing   Problem: Education: Goal: Knowledge of disease or condition will improve Outcome: Progressing   "

## 2024-02-13 ENCOUNTER — Other Ambulatory Visit (HOSPITAL_COMMUNITY): Payer: Self-pay

## 2024-02-13 ENCOUNTER — Ambulatory Visit: Admitting: Podiatry

## 2024-02-13 DIAGNOSIS — J4489 Other specified chronic obstructive pulmonary disease: Secondary | ICD-10-CM

## 2024-02-13 DIAGNOSIS — J849 Interstitial pulmonary disease, unspecified: Secondary | ICD-10-CM | POA: Diagnosis not present

## 2024-02-13 DIAGNOSIS — D869 Sarcoidosis, unspecified: Secondary | ICD-10-CM | POA: Diagnosis not present

## 2024-02-13 DIAGNOSIS — J9621 Acute and chronic respiratory failure with hypoxia: Secondary | ICD-10-CM | POA: Diagnosis not present

## 2024-02-13 MED ORDER — ALBUTEROL SULFATE (2.5 MG/3ML) 0.083% IN NEBU
3.0000 mL | INHALATION_SOLUTION | Freq: Four times a day (QID) | RESPIRATORY_TRACT | 6 refills | Status: AC | PRN
  Filled 2024-02-13: qty 180, 15d supply, fill #0

## 2024-02-13 MED ORDER — FLUTICASONE PROPIONATE 50 MCG/ACT NA SUSP
2.0000 | Freq: Every day | NASAL | 2 refills | Status: AC
  Filled 2024-02-13: qty 16, 30d supply, fill #0

## 2024-02-13 MED ORDER — PREDNISONE 10 MG PO TABS
ORAL_TABLET | ORAL | 0 refills | Status: AC
  Filled 2024-02-13: qty 10, 4d supply, fill #0

## 2024-02-13 NOTE — TOC Transition Note (Addendum)
 Transition of Care [facility]) - Discharge Note   Patient Details  Name: Teresa Mendez MRN: [id number] Date of Birth: 05-16-1975  Transition of Care [facility]) CM/SW Contact:  Rosaline JONELLE Joe, RN Phone Number: 02/13/2024, 11:21 AM   Clinical Narrative:    CM spoke with bedside nursing and the patient does not have family available today to bring her home oxygen  portable tank nor provide transportation to home.  I placed DME order for portable oxygen  tank for home - co-signed by MD.  I called [employer] to deliver a portable oxygen  tank to the discharge lounge.  Discharge lounge to assist with taxi voucher to home today.  Patient is set up already with [facility] - noted thru SDOH.  Bedside nursing to discharge the patient home today by taxi.         Patient Goals and CMS Choice            Discharge Placement                       Discharge Plan and Services Additional resources added to the After Visit Summary for                                       Social Drivers of Health (SDOH) Interventions SDOH Screenings   Food Insecurity: No Food Insecurity (02/10/2024)  Housing: Low Risk (02/10/2024)  Transportation Needs: No Transportation Needs (02/10/2024)  Utilities: Not At Risk (02/10/2024)  Recent Concern: Utilities - At Risk (02/05/2024)  Alcohol Screen: Low Risk (02/18/2023)  Depression (PHQ2-9): Low Risk (02/04/2024)  Financial Resource Strain: Low Risk (02/18/2023)  Physical Activity: Inactive (02/18/2023)  Social Connections: Moderately Integrated (02/10/2024)  Stress: No Stress Concern Present (02/18/2023)  Tobacco Use: Medium Risk (02/09/2024)  Health Literacy: Inadequate Health Literacy (02/18/2023)     Readmission Risk Interventions    02/12/2024    3:09 PM  Readmission Risk Prevention Plan  Post Dischage Appt Complete  Medication Screening Complete  Transportation Screening Complete

## 2024-02-13 NOTE — Plan of Care (Signed)

## 2024-02-13 NOTE — Progress Notes (Signed)
 Occupational Therapy Treatment Patient Details Name: Teresa Mendez MRN: [id number] DOB: 1975-04-09 Today's Date: 02/13/2024   History of present illness Pt is a 49 y.o. female who presented 02/09/24 due to chronic hypoxic respiratory failure secondary to COPD exacerbation. PMH: COPD asthma syndrome, essential hypertension, sarcoidosis, stress incontinence, GERD, interstitial lung disease who is on 3 L of oxygen  at home   OT comments  OT session focused on training in energy conservation strategies for increased safety and independence with functional tasks with handouts provided. Pt verbalized and demonstrated understanding of all training through teach back. Pt currently largely demonstrating ability to complete ADLs with Mod I to Supervision. Pt participated well in session and is making progress toward goals. Acute OT frequency and discharge recommendation updated this day based on pt progress and current functional level. Acute OT to continue to follow. Post acute discharge, no skilled OT needs are anticipated. Recommend assistance from family for ADLs and IADLs as needed.       If plan is discharge home, recommend the following:  A little help with walking and/or transfers;A little help with bathing/dressing/bathroom;Assistance with cooking/housework;Assist for transportation;Help with stairs or ramp for entrance   Equipment Recommendations  None recommended by OT    Recommendations for Other Services      Precautions / Restrictions Precautions Precautions: Fall Recall of Precautions/Restrictions: Intact Restrictions Weight Bearing Restrictions Per Provider Order: No       Mobility Bed Mobility               General bed mobility comments: Pt sitting up at EOB upon arrival    Transfers Overall transfer level: Modified independent Equipment used: Rolling walker (2 wheels)   Sit to Stand: Modified independent (Device/Increase time)           General transfer  comment: Pt using RW to stand at EOB to reposition in sitting at EOB with Mod I.     Balance Overall balance assessment: Modified Independent                                         ADL either performed or assessed with clinical judgement   ADL Overall ADL's : Needs assistance/impaired                                       General ADL Comments: Pt largely demonstrating ability to complete ADLs with Mod I to Supervision. OT educated pt in energy conservation strategies to increase safety and independence with functional tasks with handouts provided. Pt verbalized and demonstrated understanding of all education through teach back with pt reporting she feels the educaiton and strategies are helpful.    Extremity/Trunk Assessment Upper Extremity Assessment Upper Extremity Assessment: Overall WFL for tasks assessed   Lower Extremity Assessment Lower Extremity Assessment: Defer to PT evaluation        Vision   Additional Comments: Vision [facility] for tasks assessed   Perception     Praxis     Communication Communication Communication: No apparent difficulties   Cognition Arousal: Alert Behavior During Therapy: WFL for tasks assessed/performed Cognition: No apparent impairments             OT - Cognition Comments: Pt AAOx4 and pleasant throughout session with cognition WFL for tasks assessed  Following commands: Intact        Cueing   Cueing Techniques: Verbal cues  Exercises      Shoulder Instructions       General Comments VSS on 3L continuous O2 throughout session    Pertinent Vitals/ Pain       Pain Assessment Pain Assessment: No/denies pain  Home Living                                          Prior Functioning/Environment              Frequency  Min 1X/week        Progress Toward Goals  OT Goals(current goals can now be found in the care plan section)  Progress  towards OT goals: Progressing toward goals (Frequency and discharge recommendation updated based on pt progress and current functional level)  Acute Rehab OT Goals Patient Stated Goal: to return home  Plan      Co-evaluation                 AM-PAC OT 6 Clicks Daily Activity     Outcome Measure   Help from another person eating meals?: None Help from another person taking care of personal grooming?: None Help from another person toileting, which includes using toliet, bedpan, or urinal?: A Little Help from another person bathing (including washing, rinsing, drying)?: A Little Help from another person to put on and taking off regular upper body clothing?: None Help from another person to put on and taking off regular lower body clothing?: A Little 6 Click Score: 21    End of Session Equipment Utilized During Treatment: Oxygen ;Rolling walker (2 wheels)  OT Visit Diagnosis: Other (comment) (decreased activity tolerance)   Activity Tolerance Patient tolerated treatment well   Patient Left in bed;with call bell/phone within reach;Other (comment) (sitting EOB)   Nurse Communication Mobility status;Other (comment) (OT providing EC training)        Time: 8890-8871 OT Time Calculation (min): 19 min  Charges: OT General Charges $OT Visit: 1 Visit OT Treatments $Self Care/Home Management : 8-22 mins  Margarie Rockey HERO., OTR/L, MA Acute Rehab [phone]   Margarie FORBES Horns 02/13/2024, 11:44 AM

## 2024-02-13 NOTE — Telephone Encounter (Signed)
 Fax received from Nena SAVANT [facility] requesting PCS request be completed for the patient.   She needs to be seen by a provider before this form can be completed because it has been more than 90 days since she was seen by provider.  She had an appointment with Haze Servant, NP on 02/11/2024 but was hospitalized and is still hospitalized today.  Will need to contact her when she is home to schedule this appointment.  I called Sandra/ [employer]: [phone] and there was no name or identifying information on the voicemail so I just left a message with my name and call back number and I did not leave a reference to this patient.

## 2024-02-13 NOTE — Discharge Summary (Addendum)
 " Physician Discharge Summary   Patient: Teresa Mendez MRN: [id number] DOB: May 11, 1975  Admit date:     02/09/2024  Discharge date: 02/13/24  Discharge Physician: Sabas GORMAN Brod   PCP: Theotis Haze ORN, NP   Recommendations at discharge:   Follow-up PCP in 1 week Check BMP in 1 week at PCP office  Discharge Diagnoses: Principal Problem:   Acute and chronic respiratory failure with hypoxia (HCC) Active Problems:   PULMONARY SARCOIDOSIS   Essential hypertension   Asthma-COPD overlap syndrome (HCC)   GERD (gastroesophageal reflux disease)   Interstitial pulmonary disease (HCC)  Resolved Problems:   * No resolved hospital problems. *  Hospital Course:   49 y.o. female with medical history significant of COPD asthma syndrome, essential hypertension, sarcoidosis, stress incontinence, GERD, interstitial lung disease who is on 3 L of oxygen  at home and quit smoking about 5 years ago presenting to the ER with shortness of breath cough and wheezing.  Patient is admitted to hospitalist service for further management evaluation of acute on chronic hypoxic respiratory failure secondary to COPD exacerbation.   Assessment and Plan:  Acute on chronic hypoxic respiratory failure. 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 -She was started on Solu-Medrol  40 mg IV every 12 hours, switch to prednisone  taper -Continue albuterol  nebulizer as needed -Continue home inhalers -Completed ceftriaxone  Zithromax  for 4 days in the hospital -Oxygen  weaned down to 3 L/min, O2 sats 100% on 3 L/min -Echo: No change from prior, EF 60% with normal diastolic parameters Patient will need to follow-up with pulmonologist Dr. Annella postdischarge for severe COPD, sarcoidosis, ILD. -Doubt PE as D-dimer is 0.51   Hypokalemia- -Replete   Hypertension: BP elevated. Continue Zestril , Norvasc , HCTZ   GERD: Continue PPI.   Morbid obesity: BMI  48.02- Diet, exercise and weight reduction advised.  Outpatient PT set up         Consultants:  Procedures performed:  Disposition: Home Diet recommendation:  Regular diet DISCHARGE MEDICATION: Allergies as of 02/13/2024   No Known Allergies      Medication List     TAKE these medications    albuterol  108 (90 Base) MCG/ACT inhaler Commonly known as: Ventolin  HFA Inhale 1-2 puffs into the lungs every 6 (six) hours as needed for wheezing or shortness of breath. What changed: how much to take   albuterol  (2.5 MG/3ML) 0.083% nebulizer solution Commonly known as: PROVENTIL  Take 3 mLs by nebulization every 6 (six) hours as needed for wheezing or shortness of breath. What changed: Another medication with the same name was changed. Make sure you understand how and when to take each.   amLODipine  10 MG tablet Commonly known as: NORVASC  Take 1 tablet (10 mg total) by mouth daily.   atorvastatin  80 MG tablet Commonly known as: LIPITOR Take 1 tablet (80 mg total) by mouth daily.   Breztri  Aerosphere 160-9-4.8 MCG/ACT Aero inhaler Generic drug: budesonide -glycopyrrolate -formoterol  Inhale 2 puffs into the lungs in the morning and at bedtime.   carvedilol  6.25 MG tablet Commonly known as: COREG  Take 1 tablet (6.25 mg total) by mouth 2 (two) times daily with a meal.   cetirizine  10 MG tablet Commonly known as: ZYRTEC  Take 1 tablet (10 mg total) by mouth daily.   diclofenac  Sodium 1 % Gel Commonly known as: VOLTAREN  Apply 2 g topically 4 (four) times daily. What changed:  how much to take when to take this reasons to take this  DULoxetine  60 MG capsule Commonly known as: CYMBALTA  Take 1 capsule (60 mg total) by mouth daily.   famotidine  20 MG tablet Commonly known as: PEPCID  Take 1 tablet (20 mg total) by mouth at bedtime. What changed: when to take this   fluticasone  50 MCG/ACT nasal spray Commonly known as: FLONASE  Place 2 sprays into both nostrils daily.    hydrochlorothiazide  25 MG tablet Commonly known as: HYDRODIURIL  Take 1 tablet (25 mg total) by mouth daily.   lisinopril  40 MG tablet Commonly known as: ZESTRIL  Take 1 tablet (40 mg total) by mouth daily.   montelukast  10 MG tablet Commonly known as: SINGULAIR  Take 1 tablet (10 mg total) by mouth at bedtime.   omeprazole  20 MG capsule Commonly known as: PRILOSEC Take 1 capsule (20 mg total) by mouth daily.   OXYGEN  Inhale 3 L into the lungs continuous. 3 liters   predniSONE  10 MG tablet Commonly known as: DELTASONE  Prednisone  40 mg po daily x 1 day then Prednisone  30 mg po daily x 1 day then Prednisone  20 mg po daily x 1 day then Prednisone  10 mg daily x 1 day then stop...   Systane 0.4-0.3 % Gel ophthalmic gel Generic drug: Polyethyl Glycol-Propyl Glycol Place 1 Application into both eyes 2 (two) times daily as needed (Severe dry eye).   triamcinolone  ointment 0.1 % Commonly known as: KENALOG  Apply 1 Application topically 2 (two) times daily. What changed:  when to take this reasons to take this   Vitamin D  (Ergocalciferol ) 1.25 MG (50000 UNIT) Caps capsule Commonly known as: DRISDOL  Take 1 capsule (50,000 Units total) by mouth every 7 (seven) days. What changed: when to take this   Wegovy  0.5 MG/0.5ML Soaj SQ injection Generic drug: semaglutide -weight management Inject 0.5 mg into the skin once a week.        Follow-up Information     Theotis Haze ORN, NP Follow up.   Specialty: Nurse Practitioner Why: Please call the office and schedule a hospital follow up in the next 7-10 days.  Check BMP in 1 week Contact information: [address] [place] [address] [phone]                Discharge Exam: Fredricka Weights   02/09/24 1802  Weight: 126.9 kg   General-appears in no acute distress Heart-S1-S2, regular, no murmur auscultated Lungs-clear to auscultation bilaterally, no wheezing or crackles auscultated Abdomen-soft, nontender, no  organomegaly Extremities-no edema in the lower extremities Neuro-alert, oriented x3, no focal deficit noted  Condition at discharge: good  The results of significant diagnostics from this hospitalization (including imaging, microbiology, ancillary and laboratory) are listed below for reference.   Imaging Studies: ECHOCARDIOGRAM COMPLETE Result Date: 02/10/2024    ECHOCARDIOGRAM REPORT   Patient Name:   DOREE KUEHNE Date of Exam: 02/10/2024 Medical Rec #:  [id number]      Height:       64.0 in Accession #:    [id number]     Weight:       279.8 lb Date of Birth:  06/26/1975       BSA:          2.256 m Patient Age:    48 years       BP:           142/95 mmHg Patient Gender: F              HR:           93 bpm. Exam Location:  Inpatient Procedure: 2D Echo, Cardiac Doppler and Color Doppler (Both Spectral and Color            Flow Doppler were utilized during procedure). Indications:    Dyspnea  History:        Patient has prior history of Echocardiogram examinations, most                 recent 05/18/2019. Signs/Symptoms:Dyspnea; Risk                 Factors:Hypertension, Former Smoker and GERD, Sarcoidosis.  Sonographer:    Koleen Popper RDCS Referring Phys: [id number] CONCEPCION RISER  Sonographer Comments: Image acquisition challenging due to patient body habitus. IMPRESSIONS  1. Left ventricular ejection fraction, by estimation, is 60 to 65%. The left ventricle has normal function. The left ventricle has no regional wall motion abnormalities. There is mild concentric left ventricular hypertrophy. Left ventricular diastolic parameters were normal.  2. Right ventricular systolic function is normal. The right ventricular size is normal. Tricuspid regurgitation signal is inadequate for assessing PA pressure.  3. The mitral valve was not well visualized. No evidence of mitral valve regurgitation. No evidence of mitral stenosis.  4. The aortic valve is grossly normal. Aortic valve regurgitation is not  visualized. No aortic stenosis is present.  5. Aortic dilatation noted. There is borderline dilatation of the ascending aorta, measuring 39 mm.  6. The inferior vena cava is dilated in size with >50% respiratory variability, suggesting right atrial pressure of 8 mmHg. Comparison(s): No significant change from prior study. FINDINGS  Left Ventricle: Left ventricular ejection fraction, by estimation, is 60 to 65%. The left ventricle has normal function. The left ventricle has no regional wall motion abnormalities. The left ventricular internal cavity size was normal in size. There is  mild concentric left ventricular hypertrophy. Left ventricular diastolic parameters were normal. Right Ventricle: The right ventricular size is normal. No increase in right ventricular wall thickness. Right ventricular systolic function is normal. Tricuspid regurgitation signal is inadequate for assessing PA pressure. Left Atrium: Left atrial size was normal in size. Right Atrium: Right atrial size was normal in size. Pericardium: There is no evidence of pericardial effusion. Mitral Valve: The mitral valve was not well visualized. No evidence of mitral valve regurgitation. No evidence of mitral valve stenosis. Tricuspid Valve: The tricuspid valve is not well visualized. Tricuspid valve regurgitation is not demonstrated. No evidence of tricuspid stenosis. Aortic Valve: The aortic valve is grossly normal. Aortic valve regurgitation is not visualized. No aortic stenosis is present. Pulmonic Valve: The pulmonic valve was not well visualized. Pulmonic valve regurgitation is not visualized. No evidence of pulmonic stenosis. Aorta: Aortic dilatation noted. There is borderline dilatation of the ascending aorta, measuring 39 mm. Venous: The inferior vena cava is dilated in size with greater than 50% respiratory variability, suggesting right atrial pressure of 8 mmHg. IAS/Shunts: The atrial septum is grossly normal.  LEFT VENTRICLE PLAX 2D LVIDd:          4.90 cm      Diastology LVIDs:         3.10 cm      LV e' medial:    8.70 cm/s LV PW:         1.30 cm      LV E/e' medial:  8.7 LV IVS:        1.40 cm      LV e' lateral:   7.72 cm/s LVOT diam:     2.10 cm  LV E/e' lateral: 9.8 LV SV:         58 LV SV Index:   26 LVOT Area:     3.46 cm  LV Volumes (MOD) LV vol d, MOD A2C: 159.0 ml LV vol d, MOD A4C: 147.0 ml LV vol s, MOD A2C: 53.0 ml LV vol s, MOD A4C: 62.3 ml LV SV MOD A2C:     106.0 ml LV SV MOD A4C:     147.0 ml LV SV MOD BP:      99.4 ml RIGHT VENTRICLE             IVC RV S prime:     13.60 cm/s  IVC diam: 2.70 cm TAPSE (M-mode): 2.5 cm LEFT ATRIUM             Index        RIGHT ATRIUM           Index LA diam:        4.00 cm 1.77 cm/m   RA Area:     13.20 cm LA Vol (A2C):   50.2 ml 22.25 ml/m  RA Volume:   33.50 ml  14.85 ml/m LA Vol (A4C):   39.4 ml 17.46 ml/m LA Biplane Vol: 48.2 ml 21.36 ml/m  AORTIC VALVE LVOT Vmax:   107.00 cm/s LVOT Vmean:  69.200 cm/s LVOT VTI:    0.167 m  AORTA Ao Root diam: 3.00 cm Ao Asc diam:  3.90 cm MITRAL VALVE MV Area (PHT): 4.80 cm     SHUNTS MV Decel Time: 158 msec     Systemic VTI:  0.17 m MV E velocity: 75.90 cm/s   Systemic Diam: 2.10 cm MV A velocity: 108.00 cm/s MV E/A ratio:  0.70 Shelda Bruckner MD Electronically signed by Shelda Bruckner MD Signature Date/Time: 02/10/2024/7:26:38 PM    Final    DG Chest Portable 1 View Result Date: 02/09/2024 CLINICAL DATA:  Shortness of breath. EXAM: PORTABLE CHEST 1 VIEW COMPARISON:  December 30, 2023 FINDINGS: The heart size and mediastinal contours are within normal limits. There is mild prominence of the pulmonary vasculature. Mild linear scarring and/or atelectasis is seen within the mid right lung. No acute infiltrate, pleural effusion or pneumothorax is identified. No acute osseous abnormalities are noted. IMPRESSION: 1. Mild pulmonary vascular congestion. 2. Mild mid right lung linear scarring and/or atelectasis. Electronically Signed   By:  Suzen Dials M.D.   On: 02/09/2024 18:57    Microbiology: Results for orders placed or performed during the hospital encounter of 02/09/24  Resp panel by RT-PCR (RSV, Flu A&B, Covid) Anterior Nasal Swab     Status: None   Collection Time: 02/09/24  6:23 PM   Specimen: Anterior Nasal Swab  Result Value Ref Range Status   SARS Coronavirus 2 by RT PCR NEGATIVE NEGATIVE Final   Influenza A by PCR NEGATIVE NEGATIVE Final   Influenza B by PCR NEGATIVE NEGATIVE Final    Comment: (NOTE) The Xpert Xpress SARS-CoV-2/FLU/RSV plus assay is intended as an aid in the diagnosis of influenza from Nasopharyngeal swab specimens and should not be used as a sole basis for treatment. Nasal washings and aspirates are unacceptable for Xpert Xpress SARS-CoV-2/FLU/RSV testing.  Fact Sheet for Patients: bloggercourse.com  Fact Sheet for Healthcare Providers: seriousbroker.it  This test is not yet approved or cleared by the [place]  FDA and has been authorized for detection and/or diagnosis of SARS-CoV-2 by FDA under an Emergency Use Authorization (EUA). This EUA will remain in effect (  meaning this test can be used) for the duration of the COVID-19 declaration under Section 564(b)(1) of the Act, 21 U.S.C. section 360bbb-3(b)(1), unless the authorization is terminated or revoked.     Resp Syncytial Virus by PCR NEGATIVE NEGATIVE Final    Comment: (NOTE) Fact Sheet for Patients: bloggercourse.com  Fact Sheet for Healthcare Providers: seriousbroker.it  This test is not yet approved or cleared by the [place]  FDA and has been authorized for detection and/or diagnosis of SARS-CoV-2 by FDA under an Emergency Use Authorization (EUA). This EUA will remain in effect (meaning this test can be used) for the duration of the COVID-19 declaration under Section 564(b)(1) of the Act, 21 U.S.C. section  360bbb-3(b)(1), unless the authorization is terminated or revoked.  Performed at [facility], [address], [place], [place] [address]   Respiratory (~20 pathogens) panel by PCR     Status: Abnormal   Collection Time: 02/10/24  1:47 PM   Specimen: Nasopharyngeal Swab; Respiratory  Result Value Ref Range Status   Adenovirus NOT DETECTED NOT DETECTED Final   Coronavirus 229E NOT DETECTED NOT DETECTED Final    Comment: (NOTE) The Coronavirus on the Respiratory Panel, DOES NOT test for the novel  Coronavirus (2019 nCoV)    Coronavirus HKU1 DETECTED (A) NOT DETECTED Final   Coronavirus NL63 NOT DETECTED NOT DETECTED Final   Coronavirus OC43 NOT DETECTED NOT DETECTED Final   Metapneumovirus NOT DETECTED NOT DETECTED Final   Rhinovirus / Enterovirus NOT DETECTED NOT DETECTED Final   Influenza A NOT DETECTED NOT DETECTED Final   Influenza B NOT DETECTED NOT DETECTED Final   Parainfluenza Virus 1 NOT DETECTED NOT DETECTED Final   Parainfluenza Virus 2 NOT DETECTED NOT DETECTED Final   Parainfluenza Virus 3 NOT DETECTED NOT DETECTED Final   Parainfluenza Virus 4 NOT DETECTED NOT DETECTED Final   Respiratory Syncytial Virus NOT DETECTED NOT DETECTED Final   Bordetella pertussis NOT DETECTED NOT DETECTED Final   Bordetella Parapertussis NOT DETECTED NOT DETECTED Final   Chlamydophila pneumoniae NOT DETECTED NOT DETECTED Final   Mycoplasma pneumoniae NOT DETECTED NOT DETECTED Final    Comment: Performed at [facility], [address], [place], [place] [address]    Labs: CBC: Recent Labs  Lab 02/09/24 1830 02/09/24 1900 02/09/24 1901 02/10/24 0510 02/12/24 0245  WBC 9.3  --   --  9.1 14.5*  NEUTROABS 6.5  --   --   --   --   HGB 9.9* 10.9* 10.9* 9.4* 9.8*  HCT 32.8* 32.0* 32.0* 32.0* 32.4*  MCV 77.2*  --   --  77.5* 75.9*  PLT 300  --   --  273 318   Basic Metabolic Panel: Recent Labs  Lab 02/09/24 1900 02/09/24 1901 02/09/24 1921 02/10/24 0510  02/12/24 0245  NA 144 145 143 143 140  K 3.3* 3.3* 3.8 3.2* 3.8  CL  --  97* 102 101 96*  CO2  --   --  34* 30 35*  GLUCOSE  --  132* 128* 214* 163*  BUN  --  13 13 12 18   CREATININE  --  1.00 0.87 0.83 0.80  CALCIUM   --   --  8.8* 8.9 9.2   Liver Function Tests: Recent Labs  Lab 02/09/24 1921 02/10/24 0510  AST 30 20  ALT 15 16  ALKPHOS 91 103  BILITOT 0.4 0.3  PROT 7.1 7.8  ALBUMIN 3.6 4.0   CBG: No results for input(s): GLUCAP in  the last 168 hours.  Discharge time spent: greater than 30 minutes.  Signed: Sabas GORMAN Brod, MD [facility] 02/13/2024 "

## 2024-02-14 ENCOUNTER — Telehealth: Payer: Self-pay | Admitting: *Deleted

## 2024-02-14 NOTE — Transitions of Care (Post Inpatient/ED Visit) (Signed)
 "  02/14/2024  Name: Teresa Mendez MRN: [id number] DOB: [birth year]  Today's TOC FU Call Status: Today's TOC FU Call Status:: Successful TOC FU Call Completed TOC FU Call Complete Date: 02/14/24  Patient's Name and Date of Birth confirmed. Name, DOB  Transition Care Management Follow-up Telephone Call Date of Discharge: 02/13/24 Discharge Facility: [facility]) Type of Discharge: Inpatient Admission Primary Inpatient Discharge Diagnosis:: Acute and chronic respiratory failure with hypoxia How have you been since you were released from the hospital?: Better Any questions or concerns?: No  Items Reviewed: Did you receive and understand the discharge instructions provided?: Yes Medications obtained,verified, and reconciled?: Yes (Medications Reviewed) Any new allergies since your discharge?: No Dietary orders reviewed?: Yes Type of Diet Ordered:: Heart Healthy Do you have support at home?: Yes People in Home [RPT]: child(ren), adult Name of Support/Comfort Primary Source: Son/Antonio  Medications Reviewed Today: Medications Reviewed Today     Reviewed by Lucky Andrea LABOR, RN (Registered Nurse) on 02/14/24 at 1103  Med List Status: <None>   Medication Order Taking? Sig Documenting Provider Last Dose Status Informant  albuterol  (PROVENTIL ) (2.5 MG/3ML) 0.083% nebulizer solution [id number] Yes Take 3 mLs by nebulization every 6 (six) hours as needed for wheezing or shortness of breath. Drusilla Sabas RAMAN, MD  Active   albuterol  (VENTOLIN  HFA) 108 (90 Base) MCG/ACT inhaler [id number] Yes Inhale 1-2 puffs into the lungs every 6 (six) hours as needed for wheezing or shortness of breath. Theotis Haze ORN, NP  Active Self, Pharmacy Records           Med Note LEOBARDO SHELVA Repress Feb 09, 2024 11:23 PM) RESCUE INHALER   amLODipine  (NORVASC ) 10 MG tablet [id number] Yes Take 1 tablet (10 mg total) by mouth daily. Theotis Haze ORN, NP  Active Self, Pharmacy Records  atorvastatin  (LIPITOR) 80 MG tablet  [id number] Yes Take 1 tablet (80 mg total) by mouth daily. Theotis Haze ORN, NP  Active Self, Pharmacy Records  Budeson-Glycopyrrol-Formoterol  (BREZTRI  AEROSPHERE) 160-9-4.8 MCG/ACT TERESE [id number] Yes Inhale 2 puffs into the lungs in the morning and at bedtime. Theotis Haze ORN, NP  Active Self, Pharmacy Records  carvedilol  (COREG ) 6.25 MG tablet [id number] Yes Take 1 tablet (6.25 mg total) by mouth 2 (two) times daily with a meal. Theotis Haze ORN, NP  Active Self, Pharmacy Records  cetirizine  (ZYRTEC ) 10 MG tablet [id number] Yes Take 1 tablet (10 mg total) by mouth daily. Theotis Haze ORN, NP  Active Self, Pharmacy Records  diclofenac  Sodium (VOLTAREN ) 1 % GEL [id number]  Apply 2 g topically 4 (four) times daily.  Patient not taking: Reported on 02/14/2024   Fleming, Zelda W, NP  Active Self, Pharmacy Records  DULoxetine  (CYMBALTA ) 60 MG capsule [id number] Yes Take 1 capsule (60 mg total) by mouth daily. Theotis Haze ORN, NP  Active Self, Pharmacy Records  famotidine  (PEPCID ) 20 MG tablet [id number] Yes Take 1 tablet (20 mg total) by mouth at bedtime. Theotis Haze ORN, NP  Active Self, Pharmacy Records  fluticasone  (FLONASE ) 50 MCG/ACT nasal spray [id number] Yes Place 2 sprays into both nostrils daily. Drusilla Sabas RAMAN, MD  Active   hydrochlorothiazide  (HYDRODIURIL ) 25 MG tablet [id number] Yes Take 1 tablet (25 mg total) by mouth daily. Theotis Haze ORN, NP  Active Self, Pharmacy Records  lisinopril  (ZESTRIL ) 40 MG tablet [id number] Yes Take 1 tablet (40 mg total) by mouth daily. Theotis Haze ORN, NP  Active Self, Pharmacy Records  montelukast  (SINGULAIR ) 10 MG tablet [id number] Yes Take 1  tablet (10 mg total) by mouth at bedtime. Theotis Haze ORN, NP  Active Self, Pharmacy Records  omeprazole  [facility]) 20 MG capsule [id number] Yes Take 1 capsule (20 mg total) by mouth daily. Theotis Haze ORN, NP  Active Self, Pharmacy Records  OXYGEN  [id number] Yes Inhale 3 L into the lungs continuous. 3 liters [provider]  Active Self, Pharmacy Records  Polyethyl Glycol-Propyl Glycol (SYSTANE) 0.4-0.3 % GEL ophthalmic gel [id number] Yes Place 1 Application into both eyes 2 (two) times daily as needed (Severe dry eye). [provider]  Active Pharmacy Records, Self  predniSONE  (DELTASONE ) 10 MG tablet [id number] Yes Take 4 tablets (40 mg total) by mouth daily for 1 day, THEN 3 tablets (30 mg total) daily for 1 day, THEN 2 tablets (20 mg total) daily for 1 day, THEN 1 tablet (10 mg total) daily for 1 day THEN stop Lama, Sabas RAMAN, MD  Active   semaglutide -weight management (WEGOVY ) 0.5 MG/0.5ML SOAJ SQ injection [id number]  Inject 0.5 mg into the skin once a week.  Patient not taking: Reported on 02/14/2024   Newlin, Enobong, MD  Active   triamcinolone  ointment (KENALOG ) 0.1 % [id number] Yes Apply 1 Application topically 2 (two) times daily.  Patient taking differently: Apply 1 Application topically 2 (two) times daily as needed (dry skin/eczema).   Theotis Haze ORN, NP  Active Self, Pharmacy Records  Vitamin D , Ergocalciferol , (DRISDOL ) 1.25 MG (50000 UNIT) CAPS capsule [id number] Yes Take 1 capsule (50,000 Units total) by mouth every 7 (seven) days. Theotis Haze ORN, NP  Active Self, Pharmacy Records            Home Care and Equipment/Supplies: Were Home Health Services Ordered?: No Any new equipment or medical supplies ordered?: No  Functional Questionnaire: Do you need assistance with bathing/showering or dressing?: Yes (PCS daily for 2hrs) Do you need assistance with meal preparation?: Yes Do you need assistance with eating?: No Do you have difficulty maintaining continence: No Do you need assistance with getting out of bed/getting out of a chair/moving?: No Do you have difficulty managing or taking your medications?: No  Follow up appointments reviewed: PCP Follow-up appointment confirmed?: No (RNCM assisted with scheduling on 02/26/24) MD Provider Line [phone] Given:  No Specialist Hospital Follow-up appointment confirmed?: No Reason Specialist Follow-Up Not Confirmed: Patient has Specialist Provider Number and will Call for Appointment Do you need transportation to your follow-up appointment?: No Do you understand care options if your condition(s) worsen?: Yes-patient verbalized understanding  SDOH Interventions Today    Flowsheet Row Most Recent Value  SDOH Interventions   Food Insecurity Interventions Intervention Not Indicated  Housing Interventions Intervention Not Indicated  Transportation Interventions Intervention Not Indicated  Utilities Interventions Intervention Not Indicated    Andrea Dimes RN, BSN [place]  [facility] RN Care Manager [phone]  "

## 2024-02-17 ENCOUNTER — Other Ambulatory Visit (HOSPITAL_COMMUNITY): Payer: Self-pay

## 2024-02-17 NOTE — Telephone Encounter (Signed)
 I called the patient to let her know that we have a new clinic opening this week and Ms Theotis, NP will be there on 02/20/2024 and has appointments available if she would like to be seen prior to 02/26/2024.  I had to leave a message requesting a call back.

## 2024-02-18 ENCOUNTER — Other Ambulatory Visit (HOSPITAL_COMMUNITY): Payer: Self-pay

## 2024-02-19 ENCOUNTER — Other Ambulatory Visit: Payer: Self-pay

## 2024-02-19 ENCOUNTER — Other Ambulatory Visit (HOSPITAL_COMMUNITY): Payer: Self-pay

## 2024-02-19 MED ORDER — PULSE OXIMETER FOR FINGER MISC: 0 refills | Status: AC

## 2024-02-19 MED ORDER — FLUTICASONE PROPIONATE 50 MCG/ACT NA SUSP: 2.0000 | Freq: Every day | NASAL | 2 refills | Status: AC

## 2024-02-19 MED ORDER — ALBUTEROL SULFATE (2.5 MG/3ML) 0.083% IN NEBU: 3.0000 mL | INHALATION_SOLUTION | Freq: Four times a day (QID) | RESPIRATORY_TRACT | 6 refills | Status: AC | PRN

## 2024-02-19 NOTE — Patient Instructions (Signed)
 Niels BIRCH Leuthold - I am sorry I was unable to reach you today for our scheduled appointment. I work with Theotis Haze ORN, NP and am calling to support your healthcare needs. Please contact me at [phone] at your earliest convenience. I look forward to speaking with you soon.   Thank you,   Laymon Doll, BSW /VBCI - [facility] Social Worker [phone]

## 2024-02-25 ENCOUNTER — Other Ambulatory Visit: Payer: Self-pay

## 2024-02-25 NOTE — Patient Instructions (Signed)
 Niels BIRCH Haith - I am sorry I was unable to reach you today for our scheduled appointment. I work with Theotis Haze ORN, NP and am calling to support your healthcare needs. Please contact me at [phone] at your earliest convenience. I look forward to speaking with you soon.   Thank you,   Laymon Doll, BSW [place]/VBCI - [facility] Social Worker [phone]

## 2024-02-26 ENCOUNTER — Ambulatory Visit: Payer: Self-pay | Admitting: *Deleted

## 2024-02-27 NOTE — Telephone Encounter (Signed)
 Noted.

## 2024-02-27 NOTE — Telephone Encounter (Unsigned)
 Copied from CRM #[id number]. Topic: General - Other >> Feb 27, 2024  3:17 PM Antwanette L wrote: Reason for CRM: Nena with [facility] is calling about a fax that was sent on 1/14 for an independent assessment for personal care services. The forms needs to be completed and faxed at the patient next appt on 2/13. Nena is requesting clinical notes from that visit. Please fax the completed documents to [phone]. Nena can be reached at [phone]

## 2024-03-04 ENCOUNTER — Other Ambulatory Visit: Payer: Self-pay

## 2024-03-04 NOTE — Patient Instructions (Signed)
 Visit Information  Teresa Mendez was given information about Medicaid Managed Care team care coordination services as a part of their [facility] Community Plan Medicaid benefit.   If you would like to schedule transportation through your [facility], please call the following number at least 2 days in advance of your appointment: [phone]   Rides for urgent appointments can also be made after hours by calling Member Services.  Call the Behavioral Health Crisis Line at [phone], at any time, 24 hours a day, 7 days a week. If you are in danger or need immediate medical attention call 911.  Please see education materials related to food resources provided by mail.  Patient verbalizes understanding of instructions and care plan provided today and agrees to view in MyChart. Active MyChart status and patient understanding of how to access instructions and care plan via MyChart confirmed with patient.     Telephone follow up appointment with Managed Medicaid care management team member scheduled for: 03/18/2024 at 11:30AM.  Laymon Doll, BSW /VBCI - [facility] Social Worker [phone]   Following is a copy of your plan of care:   Goals Addressed             This Visit's Progress    BSW Goal   On track    Current SDOH Barriers:  Utility assistance need Food resources  Interventions: Patient interviewed and appropriate screenings performed Referred patient to community resources  Provided patient with information about out of the garden food project, [employer], and [facility] wellness fruits and veggies benefit x6 months. Discussed plans with patient for ongoing follow up and provided patient with direct contact number Advised patient to f/u with [facility] Tax Department regarding current property tax bill and request assistance with paying it or going on an installment plan.  BSW will be mailing out utility/food  resources. BSW assisted patient in enrolling into fruits and veggies benefit for 6 months through [facility].  Pt is aware of new transportation benefit through [facility] for medicaid appts. 03/04/2024 BSW provided patient food resources for immediate access over the phone. BSW will mail out additional food resources via mail.  Patient confirmed she received fruits and veggies box from [facility]. Patient was encouraged to call [facility] tax department again and ask about their property tax relief programs and determine if she qualifies for any.  Pt states she is doing better since her hospitalization.

## 2024-03-04 NOTE — Patient Outreach (Signed)
 Social Drivers of Health  Community Resource and Care Coordination Visit Note   03/04/2024  Name: Teresa Mendez MRN: [id number] DOB:Jun 19, 1975  Situation: Referral received for [facility] needs assessment and assistance related to Transportation Financial Strain  utility assistance. I obtained verbal consent from Patient.  Visit completed with Patient on the phone.   Background:   SDOH Interventions Today    Flowsheet Row Most Recent Value  SDOH Interventions   Food Insecurity Interventions --  [BSW will provide food resources for february via mail. BSW provided food resources over the phone for immediate access.]  Housing Interventions Intervention Not Indicated, Other (Comment)  [Patient was in communication with tax department of [place] re property taxes. Was told she still has time to pay. BSW encouraged patient to call again and ask about property tax programs for seniors and disabled people.]  Transportation Interventions Payor Benefit, Other (Comment)  [patient uses medicaid transportation for doctors appts and uses uber for other transportation needs.]  Utilities Interventions Community Resources Provided  [employer] provided resource for water bill assistance over the phone.]  Financial Strain Interventions Intervention Not Indicated     Assessment:   Goals Addressed             This Visit's Progress    BSW Goal   On track    Current SDOH Barriers:  Utility assistance need Food resources  Interventions: Patient interviewed and appropriate screenings performed Referred patient to community resources  Provided patient with information about out of the garden food project, [employer], and [facility] wellness fruits and veggies benefit x6 months. Discussed plans with patient for ongoing follow up and provided patient with direct contact number Advised patient to f/u with [facility] Tax Department regarding current property tax bill and request assistance with paying it or going on an  installment plan.  BSW will be mailing out utility/food resources. BSW assisted patient in enrolling into fruits and veggies benefit for 6 months through [facility].  Pt is aware of new transportation benefit through [facility] for medicaid appts. 03/04/2024 BSW provided patient food resources for immediate access over the phone. BSW will mail out additional food resources via mail.  Patient confirmed she received fruits and veggies box from [facility]. Patient was encouraged to call [facility] tax department again and ask about their property tax relief programs and determine if she qualifies for any.  Pt states she is doing better since her hospitalization.          Recommendation:   attend all scheduled provider appointments call for transportation assistance at least one week before appointments ask for help if you don't understand your health insurance benefits call and/or follow up with food resources provided for food assistance  Follow Up Plan:   Telephone follow up appointment date/time:  03/18/2024 at 11:30AM.  Laymon Doll, BSW [place]/VBCI - [facility] Social Worker [phone]

## 2024-03-05 ENCOUNTER — Ambulatory Visit: Admitting: Podiatry

## 2024-03-06 ENCOUNTER — Telehealth: Payer: Self-pay | Admitting: *Deleted

## 2024-03-13 ENCOUNTER — Ambulatory Visit: Admitting: Nurse Practitioner

## 2024-03-18 ENCOUNTER — Telehealth

## 2024-03-30 ENCOUNTER — Encounter: Payer: Self-pay | Admitting: Family Medicine

## 2024-04-07 ENCOUNTER — Telehealth: Payer: Self-pay | Admitting: *Deleted

## 2024-04-08 ENCOUNTER — Ambulatory Visit: Admitting: Pulmonary Disease
